# Patient Record
Sex: Male | Born: 1960 | Race: Black or African American | Hispanic: No | Marital: Married | Smoking: Never smoker
Health system: Southern US, Community
[De-identification: ages and names within clinical notes are randomized; demographics above are authoritative.]

## PROBLEM LIST (undated history)

## (undated) DIAGNOSIS — I10 Essential (primary) hypertension: Secondary | ICD-10-CM

## (undated) DIAGNOSIS — M1990 Unspecified osteoarthritis, unspecified site: Secondary | ICD-10-CM

## (undated) DIAGNOSIS — R55 Syncope and collapse: Secondary | ICD-10-CM

## (undated) DIAGNOSIS — C801 Malignant (primary) neoplasm, unspecified: Secondary | ICD-10-CM

## (undated) DIAGNOSIS — T4145XA Adverse effect of unspecified anesthetic, initial encounter: Secondary | ICD-10-CM

## (undated) DIAGNOSIS — M109 Gout, unspecified: Secondary | ICD-10-CM

## (undated) DIAGNOSIS — R011 Cardiac murmur, unspecified: Secondary | ICD-10-CM

## (undated) DIAGNOSIS — T8859XA Other complications of anesthesia, initial encounter: Secondary | ICD-10-CM

## (undated) HISTORY — PX: NO PAST SURGERIES: SHX2092

---
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
## 2006-12-02 ENCOUNTER — Emergency Department (HOSPITAL_COMMUNITY): Admission: EM | Admit: 2006-12-02 | Discharge: 2006-12-02 | Payer: Self-pay | Admitting: Emergency Medicine

## 2009-04-06 ENCOUNTER — Emergency Department (HOSPITAL_COMMUNITY): Admission: EM | Admit: 2009-04-06 | Discharge: 2009-04-06 | Payer: Self-pay | Admitting: Emergency Medicine

## 2011-05-17 ENCOUNTER — Observation Stay (HOSPITAL_COMMUNITY)
Admission: EM | Admit: 2011-05-17 | Discharge: 2011-05-18 | Disposition: A | Discharge status: Home / Self Care | Payer: Self-pay | Attending: Cardiology | Admitting: Cardiology

## 2011-05-17 ENCOUNTER — Emergency Department (HOSPITAL_COMMUNITY): Payer: Self-pay

## 2011-05-17 DIAGNOSIS — R55 Syncope and collapse: Secondary | ICD-10-CM

## 2011-05-17 DIAGNOSIS — R061 Stridor: Secondary | ICD-10-CM

## 2011-05-17 DIAGNOSIS — R0602 Shortness of breath: Secondary | ICD-10-CM | POA: Insufficient documentation

## 2011-05-17 DIAGNOSIS — R002 Palpitations: Secondary | ICD-10-CM | POA: Insufficient documentation

## 2011-05-17 LAB — CBC
HCT: 41.8 % (ref 39.0–52.0)
Hemoglobin: 14.2 g/dL (ref 13.0–17.0)
MCHC: 34 g/dL (ref 30.0–36.0)
MCV: 83.8 fL (ref 78.0–100.0)
Platelets: 194 10*3/uL (ref 150–400)
RDW: 13.3 % (ref 11.5–15.5)
WBC: 8.5 10*3/uL (ref 4.0–10.5)

## 2011-05-17 LAB — BASIC METABOLIC PANEL
Chloride: 103 mEq/L (ref 96–112)
Creatinine, Ser: 1.26 mg/dL (ref 0.50–1.35)
Glucose, Bld: 147 mg/dL — ABNORMAL HIGH (ref 70–99)
Potassium: 4 mEq/L (ref 3.5–5.1)
Sodium: 140 mEq/L (ref 135–145)

## 2011-05-17 LAB — DIFFERENTIAL
Eosinophils Absolute: 0.2 10*3/uL (ref 0.0–0.7)
Lymphs Abs: 2.6 10*3/uL (ref 0.7–4.0)
Monocytes Absolute: 0.7 10*3/uL (ref 0.1–1.0)
Monocytes Relative: 8 % (ref 3–12)

## 2011-05-18 LAB — CARDIAC PANEL(CRET KIN+CKTOT+MB+TROPI)
Total CK: 171 U/L (ref 7–232)
Troponin I: <0.3 ng/mL (ref <0.30)

## 2011-05-18 LAB — TSH: TSH: 0.775 u[IU]/mL (ref 0.350–4.500)

## 2011-05-19 ENCOUNTER — Encounter: Payer: Self-pay | Admitting: Cardiovascular Disease

## 2011-05-26 NOTE — Discharge Summary (Signed)
  NAMESHAMARION, [name]              ACCOUNT NO.:  [id number]  MEDICAL RECORD NO.:  [id number]  LOCATION:  3734                         FACILITY:  [facility]  PHYSICIAN:  Herbert Pick. Eden Emms, MD, FACCDATE OF BIRTH:  [birth year]  DATE OF ADMISSION:  05/17/2011 DATE OF DISCHARGE:  05/18/2011                              DISCHARGE SUMMARY   PROCEDURES PERFORMED DURING HOSPITALIZATION:  None.  PRIMARY CARDIOLOGIST:  Herbert Pick. Eden Emms, MD, [facility]  PRIMARY CARE PHYSICIAN:  The patient does not have one.  FINAL DISCHARGE DIAGNOSES: 1. Syncopal episode. 2. Palpitations.  HOSPITAL COURSE:  This is [name] who presented to the emergency room after sustained tachy palpitations for 15 seconds while walking outside.  The symptoms resolved spontaneously and he was feeling better.  The patient had no prior medical history and no prior cardiac history prior to admission.  The patient was admitted under observation on telemetry and was to be evaluated for cardiac ischemia.  Labs were completed with two negative cardiac enzymes.  The patient was seen by Dr. Eden Hernandez on day of discharge and had no more near syncope or tachy palpitations.  His blood pressure remained stable and he was felt to be ready for discharge.  He will follow up with Dr. Eden Hernandez as an outpatient.  As an outpatient the patient will have echocardiogram completed to evaluate for structural heart disease and for LV function.  The patient was not prescribed any medications prior to discharge.  DISCHARGE LABORATORIES:  Hemoglobin 14.2, hematocrit 41.8, white blood cells 8.5, platelets 194.  Sodium 140, potassium 4.0, chloride 103, CO2 26, BUN 17, creatinine 1.26, glucose 147, magnesium 2.1, TSH 0.775. Troponins were negative x2.  RADIOLOGY:  Chest x-ray borderline heart size and pulmonary vascularity. No acute abnormality.  VITAL SIGNS ON DISCHARGE:  Blood pressure 167/81, pulse 59, respirations 18, temperature  98.3, O2 sat 100% on room air, the patient weighed 105.3 kg.  DISCHARGE MEDICATIONS:  The patient was not given any discharge medications on discharge.  ALLERGIES:  No known drug allergies.  FOLLOWUP PLANS AND APPOINTMENT: 1. The patient will follow up with Dr. Charlton Haws in our office at     which time an echocardiogram will have been planned for him to     review and discuss with the patient.  Our office will call to make     this appointment as this is a weekend. 2. The patient has been advised if he has recurrent symptoms he is to     report to the ER or call [id number]. 3. The patient is advised to find a primary care physician.  1. Time spent with the patient to include physician time 30 minutes.     Bettey Mare. Lyman Bishop, NP   ______________________________ Herbert Pick Eden Emms, MD, [facility]    KML/MEDQ  D:  05/18/2011  T:  05/18/2011  Job:  [id number]  Electronically Signed by Joni Reining NP on 05/22/2011 04:28:52 PM Electronically Signed by Charlton Haws MD [facility] on 05/26/2011 10:15:56 PM

## 2011-05-27 ENCOUNTER — Encounter: Payer: Self-pay | Admitting: Cardiovascular Disease

## 2011-05-27 ENCOUNTER — Other Ambulatory Visit (HOSPITAL_COMMUNITY): Payer: Self-pay | Admitting: Radiology

## 2011-05-27 DIAGNOSIS — R002 Palpitations: Secondary | ICD-10-CM

## 2011-05-28 ENCOUNTER — Ambulatory Visit (INDEPENDENT_AMBULATORY_CARE_PROVIDER_SITE_OTHER): Payer: Self-pay | Admitting: Cardiovascular Disease

## 2011-05-28 ENCOUNTER — Ambulatory Visit (HOSPITAL_COMMUNITY): Payer: Self-pay | Attending: Cardiovascular Disease | Admitting: Radiology

## 2011-05-28 ENCOUNTER — Encounter: Payer: Self-pay | Admitting: Cardiovascular Disease

## 2011-05-28 DIAGNOSIS — R002 Palpitations: Secondary | ICD-10-CM

## 2011-05-28 DIAGNOSIS — I359 Nonrheumatic aortic valve disorder, unspecified: Secondary | ICD-10-CM | POA: Insufficient documentation

## 2011-05-28 DIAGNOSIS — R011 Cardiac murmur, unspecified: Secondary | ICD-10-CM | POA: Insufficient documentation

## 2011-05-28 DIAGNOSIS — R55 Syncope and collapse: Secondary | ICD-10-CM

## 2011-05-28 DIAGNOSIS — E669 Obesity, unspecified: Secondary | ICD-10-CM | POA: Insufficient documentation

## 2011-05-28 NOTE — Progress Notes (Signed)
50 yo seen in hospital 2 weeks ago for palpitations and "syncope" R/O normal Tele.  Since D/C feels well with no recurrence.  Reviewed echo from today.  Has long standing murmur.  Mild AV thickening with trivial AR.  No need for SBE.  Active with no recurrent lightheadedness.  In hospital ECG ok with normal QT.  Discussed minimizing caffeine and stimulants.  Lab work ok in hospital.  Will need yearly F/U and echo in 2-3 years unless symptoms or murmur change.    ROS: Denies fever, malais, weight loss, blurry vision, decreased visual acuity, cough, sputum, SOB, hemoptysis, pleuritic pain, palpitaitons, heartburn, abdominal pain, melena, lower extremity edema, claudication, or rash.  All other systems reviewed and negative  General: Affect appropriate Healthy:  appears stated age HEENT: normal Neck supple with no adenopathy JVP normal no bruits no thyromegaly Lungs clear with no wheezing and good diaphragmatic motion Heart:  S1/S2 no murmur,rub, gallop or click PMI normal Abdomen: benighn, BS positve, no tenderness, no AAA no bruit.  No HSM or HJR Distal pulses intact with no bruits No edema Neuro non-focal Skin warm and dry No muscular weakness   Current Outpatient Prescriptions  Medication Sig Dispense Refill  . Multiple Vitamin (MULTIVITAMIN) capsule Take 1 capsule by mouth daily.          Allergies  Review of patient's allergies indicates no known allergies.  Electrocardiogram:  Assessment and Plan

## 2011-05-28 NOTE — Assessment & Plan Note (Signed)
Benign.  Echo with AV thickening and trivial AR  No SBE needed

## 2011-05-28 NOTE — Patient Instructions (Signed)
Your physician recommends that you schedule a follow-up appointment in: YEAR WITH DR NISHAN  Your physician recommends that you continue on your current medications as directed. Please refer to the Current Medication list given to you today. 

## 2011-05-28 NOTE — H&P (Signed)
[name], [name] NO.:  [id number]  MEDICAL RECORD NO.:  [id number]  LOCATION:  3734                         FACILITY:  [facility]  PHYSICIAN:  Harlon Flor, MD   DATE OF BIRTH:  1961-04-14  DATE OF ADMISSION:  05/18/2011 DATE OF DISCHARGE:                             HISTORY & PHYSICAL   ADMISSION DIAGNOSES: 1. Near syncope. 2. Palpitations.  PRIMARY CARE PHYSICIAN:  The patient does not have a primary care provider.  CHIEF COMPLAINT:  Palpitations and near syncope.  HISTORY OF PRESENT ILLNESS:  [name] is [name] with no prior medical history who had episodes of sustained tachy palpitations for 15 seconds while walking in from outside today.  He felt short of breath and almost passed out.  These symptoms resolved spontaneously and he is now feeling better.  He has never had symptoms like this before.  The emergency room was initially concerned about significant aortic murmur.  They have spoked to hospitalist about admission for observation, but eventually decided to discuss with Cardiology for admission.  The patient has not had any chest pain.  He did have shortness of breath with this episode, but no other symptoms.  He does not have exertional chest pain that he is aware of.  He has had no history of coronary artery disease.  PAST MEDICAL HISTORY:  None.  SOCIAL HISTORY:  The patient lives at home with his mother and is currently unemployed.  He occasionally drink alcohol, but does not smoke tobacco or use drugs.  FAMILY HISTORY:  There is no history of sudden cardiac death.  No history of early coronary artery disease.  REVIEW OF SYSTEMS:  A 14-organ systems is obtained and is negative except as per HPI.  HOME MEDICATIONS:  None.  ALLERGIES:  No known drug allergies.  PHYSICAL EXAMINATION:  VITAL SIGNS:  Blood pressure 135/60, pulse 72, respirations 16, and temperature 98.9. GENERAL:  No acute  stress. HEENT:  Extraocular movements are intact.  Oropharynx is benign. Nonicteric sclerae. NECK:  Supple. CARDIOVASCULAR:  Regular rate and rhythm with a soft early systolic ejection murmur and a preserved second heart sound. LUNGS:  Clear to auscultation bilaterally. ABDOMEN:  Soft, nontender, and nondistended. EXTREMITIES:  No clubbing, cyanosis, or edema.  Pulses are intact throughout. NEURO:  Afocal.  Moves all extremities well. LYMPH:  No lymphadenopathy. SKIN:  No rashes.  EKG shows normal sinus rhythm without ST-T changes.  Labs are reviewed and are unremarkable.  Chest x-ray is clear with borderline heart size.  ASSESSMENT/PLAN:  [name] is [name] with no prior medical history here with an episode of tachy palpitations and near syncope.  We will bring him in for observation on telemetry per the emergency room's request.  His murmur sounds benign and does not represent significant aortic stenosis.  If he has no evidence on telemetry overnight, we will plan on discharging him home tomorrow and set him up for a 2-D echocardiogram and potentially an event monitor.     Harlon Flor, MD     MMB/MEDQ  D:  05/18/2011  T:  05/18/2011  Job:  [id number]  Electronically  Signed by Meridee Score MD on 05/28/2011 08:15:54 PM

## 2011-05-28 NOTE — Assessment & Plan Note (Signed)
Nonrevurrent  Observe Avoid stimulants caffeine and excessive heat and dehydration

## 2011-05-28 NOTE — Assessment & Plan Note (Signed)
Non recurrent no evidence of significant structural heart disease.  Observe

## 2011-07-10 ENCOUNTER — Emergency Department (HOSPITAL_COMMUNITY): Payer: Self-pay

## 2011-07-10 ENCOUNTER — Emergency Department (HOSPITAL_COMMUNITY)
Admission: EM | Admit: 2011-07-10 | Discharge: 2011-07-10 | Disposition: A | Discharge status: Home / Self Care | Payer: Self-pay | Attending: Emergency Medicine | Admitting: Emergency Medicine

## 2011-07-10 DIAGNOSIS — N201 Calculus of ureter: Secondary | ICD-10-CM | POA: Insufficient documentation

## 2011-07-10 DIAGNOSIS — I10 Essential (primary) hypertension: Secondary | ICD-10-CM | POA: Insufficient documentation

## 2011-07-10 DIAGNOSIS — R109 Unspecified abdominal pain: Secondary | ICD-10-CM | POA: Insufficient documentation

## 2011-07-10 DIAGNOSIS — N289 Disorder of kidney and ureter, unspecified: Secondary | ICD-10-CM | POA: Insufficient documentation

## 2011-07-10 LAB — URINE MICROSCOPIC-ADD ON

## 2011-07-10 LAB — POCT I-STAT, CHEM 8
BUN: 16 mg/dL (ref 6–23)
Calcium, Ion: 1.15 mmol/L (ref 1.12–1.32)
Chloride: 103 mEq/L (ref 96–112)
Glucose, Bld: 163 mg/dL — ABNORMAL HIGH (ref 70–99)
Sodium: 138 mEq/L (ref 135–145)
TCO2: 23 mmol/L (ref 0–100)

## 2011-07-10 LAB — URINALYSIS, ROUTINE W REFLEX MICROSCOPIC
Bilirubin Urine: NEGATIVE
Leukocytes, UA: NEGATIVE
Nitrite: NEGATIVE
Protein, ur: NEGATIVE mg/dL
Specific Gravity, Urine: 1.025 (ref 1.005–1.030)
Urobilinogen, UA: 0.2 mg/dL (ref 0.0–1.0)

## 2011-11-02 ENCOUNTER — Encounter (HOSPITAL_COMMUNITY): Payer: Self-pay | Admitting: Emergency Medicine

## 2011-11-02 ENCOUNTER — Other Ambulatory Visit: Payer: Self-pay

## 2011-11-02 ENCOUNTER — Emergency Department (HOSPITAL_COMMUNITY)
Admission: EM | Admit: 2011-11-02 | Discharge: 2011-11-03 | Disposition: A | Discharge status: Home / Self Care | Payer: Self-pay | Attending: Emergency Medicine | Admitting: Emergency Medicine

## 2011-11-02 DIAGNOSIS — R0602 Shortness of breath: Secondary | ICD-10-CM | POA: Insufficient documentation

## 2011-11-02 DIAGNOSIS — R42 Dizziness and giddiness: Secondary | ICD-10-CM | POA: Insufficient documentation

## 2011-11-02 DIAGNOSIS — R002 Palpitations: Secondary | ICD-10-CM | POA: Insufficient documentation

## 2011-11-02 DIAGNOSIS — I10 Essential (primary) hypertension: Secondary | ICD-10-CM | POA: Insufficient documentation

## 2011-11-02 DIAGNOSIS — R51 Headache: Secondary | ICD-10-CM | POA: Insufficient documentation

## 2011-11-02 NOTE — ED Notes (Signed)
C/o feeling lightheaded and heart racing x 20 minutes.

## 2011-11-03 MED ORDER — ACETAMINOPHEN 325 MG PO TABS
650.0000 mg | ORAL_TABLET | Freq: Once | ORAL | Status: AC
  Administered 2011-11-03: 650 mg via ORAL
  Filled 2011-11-03: qty 2

## 2011-11-03 NOTE — ED Provider Notes (Signed)
History     CSN: [id number]  Arrival date & time 11/02/11  2229   First MD Initiated Contact with Patient 11/03/11 0025      Chief Complaint  Patient presents with  . Dizziness     Patient is a 50 y.o. male presenting with palpitations. The history is provided by the patient.  Palpitations  This is a recurrent problem. The current episode started 1 to 2 hours ago. The problem occurs constantly. The problem has been rapidly improving. Associated with: nothing. On average, each episode lasts 20 minutes. Associated symptoms include headaches, dizziness and shortness of breath. Pertinent negatives include no diaphoresis, no fever, no malaise/fatigue, no numbness, no chest pain, no chest pressure, no exertional chest pressure, no irregular heartbeat, no near-syncope, no syncope, no abdominal pain, no nausea, no vomiting, no back pain and no cough. He has tried nothing for the symptoms.  pt had these symptoms several months ago, admitted/evaluated and no symptoms since No syncope No fam h/o sudden death He is otherwise healthy Denies drugs or excessive caffeine  Past Medical History  Diagnosis Date  . Syncope     near  . Palpitations     History reviewed. No pertinent past surgical history.  Fam hx - negative for sudden death  History  Substance Use Topics  . Smoking status: Never Smoker   . Smokeless tobacco: Not on file  . Alcohol Use: Yes     occas      Review of Systems  Constitutional: Negative for fever, malaise/fatigue and diaphoresis.  Respiratory: Positive for shortness of breath. Negative for cough.   Cardiovascular: Positive for palpitations. Negative for chest pain, syncope and near-syncope.  Gastrointestinal: Negative for nausea, vomiting and abdominal pain.  Musculoskeletal: Negative for back pain.  Neurological: Positive for dizziness and headaches. Negative for numbness.  All other systems reviewed and are negative.    Allergies  Review of patient's  allergies indicates no known allergies.  Home Medications  No current outpatient prescriptions on file.  BP 180/70  Pulse 62  Temp(Src) 97.7 F (36.5 C) (Oral)  Resp 20  SpO2 99%  Physical Exam CONSTITUTIONAL: Well developed/well nourished HEAD AND FACE: Normocephalic/atraumatic EYES: EOMI/PERRL ENMT: Mucous membranes moist NECK: supple no meningeal signs CV: S1/S2 noted,murmur noted mostly in RUSB LUNGS: Lungs are clear to auscultation bilaterally, no apparent distress ABDOMEN: soft, nontender, no rebound or guarding GU:no cva tenderness NEURO: Pt is awake/alert, moves all extremitiesx4 EXTREMITIES: pulses normal, full ROM SKIN: warm, color normal PSYCH: no abnormalities of mood noted  ED Course  Procedures   Pt well appearing Reviewed chart, had essentially normal echo earlier this yr, seen by cardiology only to f/u every 2 yrs.  Noted to have murmur on previous exam Doubt ACS/PE at this time No recent travel, no significant PE risk factors Will need f/u for HTN and I told him this  1. Palpitations   2. Hypertension       MDM  Nursing notes reviewed and considered in documentation Previous records reviewed and considered    Date: 11/03/2011  Rate: 65  Rhythm: normal sinus rhythm  QRS Axis: normal  Intervals: normal  ST/T Wave abnormalities: nonspecific ST changes  Conduction Disutrbances:none  Narrative Interpretation:   Old EKG Reviewed: unchanged          Joya Gaskins, MD 11/03/11 [phone]

## 2011-11-03 NOTE — ED Notes (Signed)
Patient reports a 20 minute episode of feeling heart beat fast and lightheaded. Denies pain and is feeling back to normal at this time

## 2011-12-27 ENCOUNTER — Emergency Department (HOSPITAL_COMMUNITY): Payer: Self-pay

## 2011-12-27 ENCOUNTER — Emergency Department (HOSPITAL_COMMUNITY)
Admission: EM | Admit: 2011-12-27 | Discharge: 2011-12-27 | Disposition: A | Discharge status: Home / Self Care | Payer: Self-pay | Attending: Emergency Medicine | Admitting: Emergency Medicine

## 2011-12-27 ENCOUNTER — Encounter (HOSPITAL_COMMUNITY): Payer: Self-pay | Admitting: Emergency Medicine

## 2011-12-27 DIAGNOSIS — M7022 Olecranon bursitis, left elbow: Secondary | ICD-10-CM

## 2011-12-27 DIAGNOSIS — M7989 Other specified soft tissue disorders: Secondary | ICD-10-CM | POA: Insufficient documentation

## 2011-12-27 DIAGNOSIS — I10 Essential (primary) hypertension: Secondary | ICD-10-CM | POA: Insufficient documentation

## 2011-12-27 DIAGNOSIS — M7020 Olecranon bursitis, unspecified elbow: Secondary | ICD-10-CM | POA: Insufficient documentation

## 2011-12-27 HISTORY — DX: Essential (primary) hypertension: I10

## 2011-12-27 MED ORDER — OXYCODONE-ACETAMINOPHEN 5-325 MG PO TABS
2.0000 | ORAL_TABLET | Freq: Once | ORAL | Status: AC
  Administered 2011-12-27: 2 via ORAL
  Filled 2011-12-27 (×2): qty 1

## 2011-12-27 MED ORDER — NAPROXEN 500 MG PO TABS: 500.0000 mg | ORAL_TABLET | Freq: Two times a day (BID) | ORAL | Status: DC

## 2011-12-27 MED ORDER — IBUPROFEN 800 MG PO TABS
800.0000 mg | ORAL_TABLET | Freq: Once | ORAL | Status: AC
  Administered 2011-12-27: 800 mg via ORAL
  Filled 2011-12-27: qty 1

## 2011-12-27 MED ORDER — OXYCODONE-ACETAMINOPHEN 5-325 MG PO TABS: 1.0000 | ORAL_TABLET | Freq: Four times a day (QID) | ORAL | Status: AC | PRN

## 2011-12-27 NOTE — ED Provider Notes (Signed)
History     CSN: [id number]  Arrival date & time 12/27/11  1023   First MD Initiated Contact with Patient 12/27/11 1059      Chief Complaint  Patient presents with  . Arm Swelling    Left elbow    (Consider location/radiation/quality/duration/timing/severity/associated sxs/prior treatment) The history is provided by the patient. No language interpreter was used.  Swelling and pain on palpation and movement of the left elbow for the past 2 days. He denies recent injury. CT is been doing various activities around the house which are not extremely strenuous. He has no numbness or tingling. States has greatly been worse over the past 24 hours. No prior injury to this area. Pain is localized over the olecranon process. There is no redness or warmth. He denies fever. No chest pain, shortness of breath, abdominal pain, nausea, vomiting.  Past Medical History  Diagnosis Date  . Syncope     near  . Palpitations   . Hypertension     History reviewed. No pertinent past surgical history.  History reviewed. No pertinent family history.  History  Substance Use Topics  . Smoking status: Never Smoker   . Smokeless tobacco: Not on file  . Alcohol Use: Yes     occas      Review of Systems  Constitutional: Negative for fever, activity change, appetite change and fatigue.  HENT: Negative for congestion, sore throat, rhinorrhea, neck pain and neck stiffness.   Respiratory: Negative for cough and shortness of breath.   Cardiovascular: Negative for chest pain and palpitations.  Gastrointestinal: Negative for nausea, vomiting and abdominal pain.  Genitourinary: Negative for dysuria, urgency, frequency and flank pain.  Musculoskeletal: Positive for myalgias, joint swelling and arthralgias. Negative for back pain.  Neurological: Negative for dizziness, weakness, light-headedness, numbness and headaches.  All other systems reviewed and are negative.    Allergies  Review of patient's  allergies indicates no known allergies.  Home Medications   Current Outpatient Rx  Name Route Sig Dispense Refill  . ACETAMINOPHEN 500 MG PO TABS Oral Take 1,000 mg by mouth every 6 (six) hours as needed. For pain    . FISH OIL 1200 MG PO CAPS Oral Take 1,200 mg by mouth daily.    Marland Kitchen NAPROXEN 500 MG PO TABS Oral Take 1 tablet (500 mg total) by mouth 2 (two) times daily. 30 tablet 0  . OXYCODONE-ACETAMINOPHEN 5-325 MG PO TABS Oral Take 1-2 tablets by mouth every 6 (six) hours as needed for pain. 15 tablet 0    BP 165/69  Pulse 81  Temp 98.3 F (36.8 C)  Resp 20  SpO2 99%  Physical Exam  Nursing note and vitals reviewed. Constitutional: He is oriented to person, place, and time. He appears well-developed and well-nourished. No distress.  HENT:  Head: Normocephalic and atraumatic.  Mouth/Throat: Oropharynx is clear and moist.  Eyes: Conjunctivae and EOM are normal. Pupils are equal, round, and reactive to light.  Neck: Normal range of motion. Neck supple.  Cardiovascular: Normal rate, regular rhythm, normal heart sounds and intact distal pulses.  Exam reveals no gallop and no friction rub.   No murmur heard. Pulmonary/Chest: Effort normal and breath sounds normal. No respiratory distress.  Abdominal: Soft. Bowel sounds are normal. There is no tenderness.  Musculoskeletal: Normal range of motion. He exhibits tenderness.       Left elbow: He exhibits swelling. He exhibits normal range of motion, no effusion, no deformity and no laceration. tenderness found. Olecranon process  tenderness noted. No radial head, no medial epicondyle and no lateral epicondyle tenderness noted.  Lymphadenopathy:    He has no cervical adenopathy.  Neurological: He is alert and oriented to person, place, and time. No cranial nerve deficit.  Skin: Skin is warm and dry. No rash noted.    ED Course  Procedures (including critical care time)  Labs Reviewed - No data to display Dg Elbow Complete  Left  12/27/2011  *RADIOLOGY REPORT*  Clinical Data: Elbow swelling and pain.No known injury.  Decreased range of motion.  LEFT ELBOW - COMPLETE 3+ VIEW  Comparison: None.  Findings: No evidence of fracture or dislocation.  No evidence of elbow joint effusion. Small osteophytes are seen at the lateral epicondyle and olecranon process.  The  IMPRESSION: No acute findings.  Original Report Authenticated By: Danae Orleans, M.D.     1. Olecranon bursitis of left elbow       MDM  Imaging with some mild osteophyte formation consistent with early arthritis however feel the majority of symptoms are secondary to olecranon bursitis. He is provided and anti-inflammatory medication as well as pain medication. You should followup with orthopedics as needed. I also instructed to put ice for the first 2 days and heat thereafter. There is no signs of infected bursa.        Dayton Bailiff, MD 12/27/11 1140

## 2011-12-27 NOTE — ED Notes (Signed)
Pt reports swelling to left elbow x 2 days. Pt denies recent injury.

## 2011-12-27 NOTE — Discharge Instructions (Signed)
Bursitis, Olecranon Bursitis is a swelling and soreness (inflammation) of a fluid-filled sac (bursa) that overlies and protects a joint. Yours is over the elbow and the name for this is Olecranon bursitis. Other common places to get this are the shoulders, hips and knees. CAUSES Bursitis can be caused by injury, overuse of the joint, arthritis, or infection.  SYMPTOMS   Tenderness, swelling, warmth, or redness over the elbow.   Pain with moving the elbow. This is greater with bending the elbow.   There may be a squeaking when the bursa is rubbed or moved.   The bursa may increase in size without pain or discomfort.   You may develop a fever with increasing pain and swelling if the bursa becomes infected.  HOME CARE INSTRUCTIONS   Apply ice to the affected area for 15 to 20 minutes each hour while awake for 2 days. Put the ice in a plastic bag and place a towel between the bag of ice and your skin.   When resting, elevate your elbow above the level of your heart. This helps reduce swelling.   Rest the injured joint as much as possible. Continue to put the joint through a full range of motion 4 times per day. When the pain lessens, begin normal slow movements and usual activities.   Only take over-the-counter or prescription medicines for pain, discomfort, or fever as directed by your caregiver.   If conservative treatment does not work, your caregiver may decide to drain the bursa and inject cortisone. This speeds up the healing process.   Reduce your intake of milk and related dairy products. They may make your condition worse.  SEEK IMMEDIATE MEDICAL CARE IF:   Your pain increases even during treatment.   You develop an oral temperature above 102 F (38.9 C).   You have heat and inflammation over the involved bursa and elbow.   There is a red line that goes up your arm.   You develop pain with movement of your elbow.  MAKE SURE YOU:   Understand these instructions.   Will  watch your condition.   Will get help right away if you are not doing well or get worse.  Document Released: 11/19/2006 Document Revised: 07/02/2011 Document Reviewed: 10/05/2007 [employer] Patient Information 2012 [employer]. 

## 2012-02-17 ENCOUNTER — Encounter (HOSPITAL_COMMUNITY): Payer: Self-pay | Admitting: Emergency Medicine

## 2012-02-17 ENCOUNTER — Inpatient Hospital Stay (HOSPITAL_COMMUNITY)
Admission: EM | Admit: 2012-02-17 | Discharge: 2012-02-18 | DRG: 313 | Disposition: A | Discharge status: Home / Self Care | Payer: Self-pay | Attending: Family Medicine | Admitting: Family Medicine

## 2012-02-17 ENCOUNTER — Emergency Department (HOSPITAL_COMMUNITY): Payer: Self-pay

## 2012-02-17 DIAGNOSIS — R002 Palpitations: Secondary | ICD-10-CM | POA: Diagnosis present

## 2012-02-17 DIAGNOSIS — F1010 Alcohol abuse, uncomplicated: Secondary | ICD-10-CM | POA: Diagnosis present

## 2012-02-17 DIAGNOSIS — R079 Chest pain, unspecified: Secondary | ICD-10-CM | POA: Diagnosis present

## 2012-02-17 DIAGNOSIS — R55 Syncope and collapse: Secondary | ICD-10-CM | POA: Diagnosis present

## 2012-02-17 DIAGNOSIS — R0789 Other chest pain: Principal | ICD-10-CM | POA: Diagnosis present

## 2012-02-17 DIAGNOSIS — I10 Essential (primary) hypertension: Secondary | ICD-10-CM | POA: Diagnosis present

## 2012-02-17 HISTORY — DX: Syncope and collapse: R55

## 2012-02-17 HISTORY — DX: Cardiac murmur, unspecified: R01.1

## 2012-02-17 HISTORY — DX: Unspecified osteoarthritis, unspecified site: M19.90

## 2012-02-17 LAB — CBC
MCHC: 32.8 g/dL (ref 30.0–36.0)
Platelets: 201 10*3/uL (ref 150–400)
RDW: 13.2 % (ref 11.5–15.5)
WBC: 7.7 10*3/uL (ref 4.0–10.5)

## 2012-02-17 LAB — APTT: aPTT: 46 seconds — ABNORMAL HIGH (ref 24–37)

## 2012-02-17 LAB — POCT I-STAT, CHEM 8
BUN: 21 mg/dL (ref 6–23)
Creatinine, Ser: 1.2 mg/dL (ref 0.50–1.35)
Glucose, Bld: 147 mg/dL — ABNORMAL HIGH (ref 70–99)
Hemoglobin: 16.3 g/dL (ref 13.0–17.0)
Potassium: 3.8 mEq/L (ref 3.5–5.1)
Sodium: 140 mEq/L (ref 135–145)

## 2012-02-17 LAB — DIFFERENTIAL
Basophils Absolute: 0 10*3/uL (ref 0.0–0.1)
Basophils Relative: 0 % (ref 0–1)
Eosinophils Relative: 3 % (ref 0–5)
Lymphocytes Relative: 33 % (ref 12–46)
Neutro Abs: 4.3 10*3/uL (ref 1.7–7.7)

## 2012-02-17 LAB — POCT I-STAT TROPONIN I: Troponin i, poc: 0 ng/mL (ref 0.00–0.08)

## 2012-02-17 LAB — COMPREHENSIVE METABOLIC PANEL
AST: 15 U/L (ref 0–37)
Albumin: 3.8 g/dL (ref 3.5–5.2)
Chloride: 103 mEq/L (ref 96–112)
Creatinine, Ser: 1.02 mg/dL (ref 0.50–1.35)
Total Bilirubin: 0.2 mg/dL — ABNORMAL LOW (ref 0.3–1.2)
Total Protein: 7.3 g/dL (ref 6.0–8.3)

## 2012-02-17 LAB — PROTIME-INR
INR: 1.03 (ref 0.00–1.49)
Prothrombin Time: 13.7 seconds (ref 11.6–15.2)

## 2012-02-17 MED ORDER — ASPIRIN 81 MG PO CHEW
324.0000 mg | CHEWABLE_TABLET | Freq: Once | ORAL | Status: AC
  Administered 2012-02-17: 324 mg via ORAL
  Filled 2012-02-17: qty 4

## 2012-02-17 MED ORDER — HEPARIN SODIUM (PORCINE) 5000 UNIT/ML IJ SOLN
INTRAMUSCULAR | Status: AC
  Administered 2012-02-17: 4000 [IU]
  Filled 2012-02-17: qty 1

## 2012-02-17 MED ORDER — HEPARIN BOLUS VIA INFUSION: 4000.0000 [IU] | Freq: Once | INTRAVENOUS | Status: DC

## 2012-02-17 MED ORDER — HEPARIN SODIUM (PORCINE) 5000 UNIT/ML IJ SOLN: 60.0000 [IU]/kg | INTRAMUSCULAR | Status: DC

## 2012-02-17 MED ORDER — MORPHINE SULFATE 2 MG/ML IJ SOLN
INTRAMUSCULAR | Status: AC
  Filled 2012-02-17: qty 1

## 2012-02-17 MED ORDER — HEPARIN (PORCINE) IN NACL 100-0.45 UNIT/ML-% IJ SOLN
1200.0000 [IU]/h | INTRAMUSCULAR | Status: DC
  Administered 2012-02-17: 1200 [IU]/h via INTRAVENOUS
  Filled 2012-02-17 (×3): qty 250

## 2012-02-17 MED ORDER — SODIUM CHLORIDE 0.9 % IV SOLN
INTRAVENOUS | Status: DC
  Administered 2012-02-17: 22:00:00 via INTRAVENOUS

## 2012-02-17 NOTE — Progress Notes (Signed)
ANTICOAGULATION CONSULT NOTE - Initial Consult  Pharmacy Consult for Heparin Indication: STEMI  No Known Allergies  Patient Measurements:   Patient state wt=220 lbs, ht=5'7" Heparin Dosing Weight: 88  Vital Signs: Temp: 98.3 F (36.8 C) (04/16 2153) Temp src: Oral (04/16 2153) BP: 159/75 mmHg (04/16 2153) Pulse Rate: 65  (04/16 2153)  Labs: No results found for this basename: HGB:2,HCT:3,PLT:3,APTT:3,LABPROT:3,INR:3,HEPARINUNFRC:3,CREATININE:3,CKTOTAL:3,CKMB:3,TROPONINI:3 in the last 72 hours The CrCl is unknown because both a height and weight (above a minimum accepted value) are required for this calculation.  Medical History: Past Medical History  Diagnosis Date  . Syncope     near  . Palpitations   . Hypertension    Assessment: 51 yo M admitted as code STEMI, to begin heparin infusion.  Goal of Therapy:  Heparin level 0.3-0.7 units/ml   Plan:  1.  Heparin 4000 unit IV bolus x 1 2.  Begin heparin infusion at 1200 units/hr 3.  F/up after cath, or will need to order heparin level  Rolland Porter, Pharm.D., BCPS Clinical Pharmacist Pager: [phone]

## 2012-02-17 NOTE — ED Notes (Signed)
Pt moved to room blue 3, Family x2 at [facility], Dr. Sharyn Lull into room with card fellow, no changes with pt, pt calm, alert, NAD, denies pain.

## 2012-02-17 NOTE — ED Provider Notes (Signed)
History     CSN: [id number]  Arrival date & time 02/17/12  2132   First MD Initiated Contact with Patient 02/17/12 2150      Chief Complaint  Patient presents with  . Palpitations    (Consider location/radiation/quality/duration/timing/severity/associated sxs/prior treatment) HPI A LEVEL 5 CAVEAT PERTAINS DUE TO URGENT NEED FOR INTERVENTION Pt presents with c/o chest tightness and palpitations.  Pt states symptoms lasted approx 5 minutes and resolved prior to ED arrival.  He also reports sob, nausea with the chest discomfort.  No radiation, no diaphoresis.  He denies recent illness.  He states he had just gone out to walk outside when symptoms started.  He also states he had similar chest pain approx 3 months ago.    Past Medical History  Diagnosis Date  . Syncope     near  . Palpitations   . Hypertension     Past Surgical History  Procedure Date  . No past surgeries     History reviewed. No pertinent family history.  History  Substance Use Topics  . Smoking status: Never Smoker   . Smokeless tobacco: Not on file  . Alcohol Use: Yes     occas      Review of Systems UNABLE TO OBTAIN ROS DUE TO LEVEL 5 CAVEAT  Allergies  Review of patient's allergies indicates no known allergies.  Home Medications   Current Outpatient Rx  Name Route Sig Dispense Refill  . ACETAMINOPHEN 500 MG PO TABS Oral Take 1,000 mg by mouth every 6 (six) hours as needed. For pain    . NAPROXEN 500 MG PO TABS Oral Take 500 mg by mouth 2 (two) times daily as needed. For pain    . FISH OIL 1200 MG PO CAPS Oral Take 1,200 mg by mouth daily.      BP 142/54  Pulse 63  Temp(Src) 98 F (36.7 C) (Oral)  Resp 18  SpO2 100% Vitals reviewed Physical Exam Physical Examination: General appearance - alert, well appearing, and in no distress Mental status - alert, oriented to person, place, and time Mouth - mucous membranes moist, pharynx normal without lesions Chest - clear to auscultation, no  wheezes, rales or rhonchi, symmetric air entry Heart - normal rate, regular rhythm, normal S1, S2, no murmurs, rubs, clicks or gallops Abdomen - soft, nontender, nondistended, no masses or organomegaly, nabs Musculoskeletal - no joint tenderness, deformity or swelling Extremities - peripheral pulses normal, no pedal edema, no clubbing or cyanosis Skin - normal coloration and turgor, no rashes  ED Course  Procedures (including critical care time)   Date: 02/17/2012, 22:03  Rate: 66  Rhythm: normal sinus rhythm  QRS Axis: normal  Intervals: normal  ST/T Wave abnormalities: normal  Conduction Disutrbances:none  Narrative Interpretation:   Old EKG Reviewed: changes noted, ST elevations and twave inversions in II, III normalized since prior ekg tonight   Date: 02/17/2012  Rate: 62  Rhythm: normal sinus rhythm  QRS Axis: normal  Intervals: normal  ST/T Wave abnormalities: ST elevations laterally  Conduction Disutrbances:none  Narrative Interpretation:   Old EKG Reviewed: changes noted, changed compared to prior ekg of 11/02/11      9:52 PM Pt seen in EKG room after EKG given to me, code stemi activated.   10:13 PM Dr. Leeann Must, covering cardiology has seen patient, he remains chest pain free now in ED.  2nd repeat EKG showed continued EKG changes, however 3rd EKG approx 20 minutes after arrival shows resolution of ST  elevations and deep t wave inversions.  Dr. Tresa Endo recommends treating as unstable angina at this time.  He is cancelling code stemi at this time.   11:25 PM Pt remains chest pain free- d/w Leeann Must, MD- pt is stable for admission to hospitalist service at this time.     CRITICAL CARE Performed by: Ethelda Chick   Total critical care time: 45  Critical care time was exclusive of separately billable procedures and treating other patients.  Critical care was necessary to treat or prevent imminent or life-threatening deterioration.  Critical care was time  spent personally by me on the following activities: development of treatment plan with patient and/or surrogate as well as nursing, discussions with consultants, evaluation of patient's response to treatment, examination of patient, obtaining history from patient or surrogate, ordering and performing treatments and interventions, ordering and review of laboratory studies, ordering and review of radiographic studies, pulse oximetry and re-evaluation of patient's condition.  Labs Reviewed  APTT - Abnormal; Notable for the following:    aPTT 46 (*)    All other components within normal limits  COMPREHENSIVE METABOLIC PANEL - Abnormal; Notable for the following:    Glucose, Bld 138 (*)    Total Bilirubin 0.2 (*)    GFR calc non Af Amer 83 (*)    All other components within normal limits  POCT I-STAT, CHEM 8 - Abnormal; Notable for the following:    Glucose, Bld 147 (*)    All other components within normal limits  CBC  DIFFERENTIAL  PROTIME-INR  POCT I-STAT TROPONIN I  URINE RAPID DRUG SCREEN (HOSP PERFORMED)   Dg Chest Port 1 View  02/17/2012  *RADIOLOGY REPORT*  Clinical Data: Codes stem knee.  Lightheaded.  Irregular heart rate.  PORTABLE CHEST - 1 VIEW  Comparison: Seven 14,012  Findings: Stable borderline cardiomegaly.  Normal mediastinal and hilar contours.  Pulmonary vascularity normal.  No evidence of edema, focal airspace disease, or pleural effusion.  Negative for pneumothorax.  No acute bony abnormality.  IMPRESSION: Stable borderline cardiomegaly.  No acute findings identified.  Original Report Authenticated By: Britta Mccreedy, M.D.     1. Chest pain       MDM  Pt presnts with c/o palpitations and chest tightness- episode resolved upon arrival to the ED.  EKG showed ST elevations in lateral, anterior leads as well as t wave inversions in inferior leads.  Code stemi activated.  Pt placed on monitor, IV access obtained.  Cardiology evaluated patient at the bedside- repeat EKG after  approx 20 minutes in ED of no chest pain was normalized.  Heparin drip started.  Pt remained chest pain free, labs reassuring.  Cardiology recommends admission to [facility].          Ethelda Chick, MD 02/18/12 [phone]

## 2012-02-17 NOTE — ED Notes (Signed)
PT. ARRIVED WITH EMS FROM HOME , REPORTS PALPITATIONS THIS EVENING , DENIES CHEST PAIN OR SOB.

## 2012-02-17 NOTE — ED Notes (Signed)
Card MD & EDP at [facility].

## 2012-02-17 NOTE — ED Notes (Signed)
Pt's daughter given apple juice, pretzels and cookies. No other needs voiced at this time.

## 2012-02-17 NOTE — ED Notes (Signed)
Pt's daughter given more apple juice. No other needs voiced at this time.

## 2012-02-17 NOTE — ED Notes (Addendum)
EDP at [facility], updated, no changes, calm, alert, NAD, interactive, (denies: pain of any kind or any location, sob, nausea, dizziness, HA or other sx), family at [facility].

## 2012-02-17 NOTE — ED Notes (Signed)
Dr.Linker to  eval ecg at bedside,

## 2012-02-18 ENCOUNTER — Encounter (HOSPITAL_COMMUNITY): Payer: Self-pay | Admitting: Internal Medicine

## 2012-02-18 ENCOUNTER — Other Ambulatory Visit: Payer: Self-pay

## 2012-02-18 DIAGNOSIS — R079 Chest pain, unspecified: Secondary | ICD-10-CM | POA: Diagnosis present

## 2012-02-18 DIAGNOSIS — R002 Palpitations: Secondary | ICD-10-CM

## 2012-02-18 DIAGNOSIS — F1010 Alcohol abuse, uncomplicated: Secondary | ICD-10-CM | POA: Diagnosis present

## 2012-02-18 LAB — CARDIAC PANEL(CRET KIN+CKTOT+MB+TROPI)
CK, MB: 3.1 ng/mL (ref 0.3–4.0)
Relative Index: 1.8 (ref 0.0–2.5)
Relative Index: 2 (ref 0.0–2.5)
Total CK: 155 U/L (ref 7–232)
Troponin I: <0.3 ng/mL (ref <0.30)
Troponin I: <0.3 ng/mL (ref <0.30)

## 2012-02-18 LAB — CBC
MCH: 27.9 pg (ref 26.0–34.0)
MCV: 83.8 fL (ref 78.0–100.0)
Platelets: 210 10*3/uL (ref 150–400)
RBC: 4.95 MIL/uL (ref 4.22–5.81)
RDW: 13.5 % (ref 11.5–15.5)
WBC: 7.1 10*3/uL (ref 4.0–10.5)

## 2012-02-18 LAB — HEMOGLOBIN A1C: Mean Plasma Glucose: 146 mg/dL — ABNORMAL HIGH (ref <117)

## 2012-02-18 LAB — D-DIMER, QUANTITATIVE: D-Dimer, Quant: <0.22 ug/mL-FEU (ref 0.00–0.48)

## 2012-02-18 LAB — COMPREHENSIVE METABOLIC PANEL
Albumin: 3.5 g/dL (ref 3.5–5.2)
Alkaline Phosphatase: 57 U/L (ref 39–117)
BUN: 16 mg/dL (ref 6–23)
CO2: 23 mEq/L (ref 19–32)
Chloride: 104 mEq/L (ref 96–112)
Creatinine, Ser: 1.05 mg/dL (ref 0.50–1.35)
GFR calc non Af Amer: 80 mL/min — ABNORMAL LOW (ref >90)
Glucose, Bld: 115 mg/dL — ABNORMAL HIGH (ref 70–99)
Potassium: 3.9 mEq/L (ref 3.5–5.1)
Total Bilirubin: 0.3 mg/dL (ref 0.3–1.2)

## 2012-02-18 LAB — APTT: aPTT: 59 seconds — ABNORMAL HIGH (ref 24–37)

## 2012-02-18 LAB — RAPID URINE DRUG SCREEN, HOSP PERFORMED
Amphetamines: NOT DETECTED
Benzodiazepines: NOT DETECTED
Opiates: NOT DETECTED
Tetrahydrocannabinol: NOT DETECTED

## 2012-02-18 LAB — HEPARIN LEVEL (UNFRACTIONATED): Heparin Unfractionated: 0.35 IU/mL (ref 0.30–0.70)

## 2012-02-18 MED ORDER — SODIUM CHLORIDE 0.9 % IV SOLN
INTRAVENOUS | Status: DC
  Administered 2012-02-18 (×2): via INTRAVENOUS

## 2012-02-18 MED ORDER — NITROGLYCERIN 0.4 MG SL SUBL: 0.4000 mg | SUBLINGUAL_TABLET | SUBLINGUAL | Status: DC | PRN

## 2012-02-18 MED ORDER — ASPIRIN EC 81 MG PO TBEC: 81.0000 mg | DELAYED_RELEASE_TABLET | Freq: Every day | ORAL | Status: DC

## 2012-02-18 MED ORDER — ADULT MULTIVITAMIN W/MINERALS CH
1.0000 | ORAL_TABLET | Freq: Every day | ORAL | Status: DC
  Administered 2012-02-18: 1 via ORAL
  Filled 2012-02-18: qty 1

## 2012-02-18 MED ORDER — ACETAMINOPHEN 650 MG RE SUPP: 650.0000 mg | Freq: Four times a day (QID) | RECTAL | Status: DC | PRN

## 2012-02-18 MED ORDER — PNEUMOCOCCAL VAC POLYVALENT 25 MCG/0.5ML IJ INJ
0.5000 mL | INJECTION | Freq: Once | INTRAMUSCULAR | Status: AC
  Administered 2012-02-18: 0.5 mL via INTRAMUSCULAR
  Filled 2012-02-18: qty 0.5

## 2012-02-18 MED ORDER — PANTOPRAZOLE SODIUM 40 MG PO TBEC
40.0000 mg | DELAYED_RELEASE_TABLET | Freq: Every day | ORAL | Status: DC
  Administered 2012-02-18: 40 mg via ORAL
  Filled 2012-02-18: qty 1

## 2012-02-18 MED ORDER — LISINOPRIL 10 MG PO TABS: 10.0000 mg | ORAL_TABLET | Freq: Every day | ORAL | Status: DC

## 2012-02-18 MED ORDER — LORAZEPAM 0.5 MG PO TABS
0.0000 mg | ORAL_TABLET | Freq: Four times a day (QID) | ORAL | Status: DC
  Administered 2012-02-18: 0.5 mg via ORAL
  Filled 2012-02-18: qty 1

## 2012-02-18 MED ORDER — ONDANSETRON HCL 4 MG/2ML IJ SOLN: 4.0000 mg | Freq: Four times a day (QID) | INTRAMUSCULAR | Status: DC | PRN

## 2012-02-18 MED ORDER — LORAZEPAM 2 MG/ML IJ SOLN: 1.0000 mg | Freq: Four times a day (QID) | INTRAMUSCULAR | Status: DC | PRN

## 2012-02-18 MED ORDER — FOLIC ACID 1 MG PO TABS
1.0000 mg | ORAL_TABLET | Freq: Every day | ORAL | Status: DC
  Administered 2012-02-18: 1 mg via ORAL
  Filled 2012-02-18: qty 1

## 2012-02-18 MED ORDER — VITAMIN B-1 100 MG PO TABS
100.0000 mg | ORAL_TABLET | Freq: Every day | ORAL | Status: DC
  Administered 2012-02-18: 100 mg via ORAL
  Filled 2012-02-18: qty 1

## 2012-02-18 MED ORDER — HEPARIN (PORCINE) IN NACL 100-0.45 UNIT/ML-% IJ SOLN
1300.0000 [IU]/h | INTRAMUSCULAR | Status: DC
  Administered 2012-02-18: 1300 [IU]/h via INTRAVENOUS
  Filled 2012-02-18: qty 250

## 2012-02-18 MED ORDER — LORAZEPAM 0.5 MG PO TABS: 0.0000 mg | ORAL_TABLET | Freq: Two times a day (BID) | ORAL | Status: DC

## 2012-02-18 MED ORDER — ASPIRIN EC 325 MG PO TBEC
325.0000 mg | DELAYED_RELEASE_TABLET | Freq: Every day | ORAL | Status: DC
  Administered 2012-02-18: 325 mg via ORAL
  Filled 2012-02-18: qty 1

## 2012-02-18 MED ORDER — AMLODIPINE BESYLATE 5 MG PO TABS
5.0000 mg | ORAL_TABLET | Freq: Every day | ORAL | Status: DC
  Administered 2012-02-18: 5 mg via ORAL
  Filled 2012-02-18: qty 1

## 2012-02-18 MED ORDER — LORAZEPAM 0.5 MG PO TABS: 1.0000 mg | ORAL_TABLET | Freq: Four times a day (QID) | ORAL | Status: DC | PRN

## 2012-02-18 MED ORDER — SODIUM CHLORIDE 0.9 % IJ SOLN
3.0000 mL | Freq: Two times a day (BID) | INTRAMUSCULAR | Status: DC
  Administered 2012-02-18: 3 mL via INTRAVENOUS

## 2012-02-18 MED ORDER — ACETAMINOPHEN 325 MG PO TABS: 650.0000 mg | ORAL_TABLET | Freq: Four times a day (QID) | ORAL | Status: DC | PRN

## 2012-02-18 MED ORDER — THIAMINE HCL 100 MG/ML IJ SOLN
100.0000 mg | Freq: Every day | INTRAMUSCULAR | Status: DC
  Filled 2012-02-18: qty 1

## 2012-02-18 MED ORDER — ONDANSETRON HCL 4 MG PO TABS: 4.0000 mg | ORAL_TABLET | Freq: Four times a day (QID) | ORAL | Status: DC | PRN

## 2012-02-18 MED ORDER — NITROGLYCERIN 0.4 MG/HR TD PT24
0.4000 mg | MEDICATED_PATCH | Freq: Every day | TRANSDERMAL | Status: DC
  Administered 2012-02-18: 0.4 mg via TRANSDERMAL
  Filled 2012-02-18: qty 1

## 2012-02-18 NOTE — Consult Note (Signed)
CARDIOLOGY CONSULT NOTE  Patient ID: [name], MRN: [id number], DOB/AGE: 05-23-1961 51 y.o. Admit date: 02/17/2012 Date of Consult: 02/18/2012  Primary Cardiologist: Dr. Eden Emms  Chief Complaint: palpitations  HPI: Mr. [name] is a 51 y/o M with hx of HTN, excessive EtOH use who presented to [facility] with palpitations. We first met him in July 2012 after an episode of near-syncope preceded by tachypalpitations, after which he ruled out for MI & had a relatively unremarkable 2D echo. He has done well since that time, but last night around 8:15pm he developed gradual onset of "building" palpitations, which started out as irregular and eventually progressed to a racing heart. He stepped outside in the backyard to try and walk it off but "couldn't catch his breath" so his wife called 911. He denies any chest pain or pressure but did feel a fleeting in his mid-back. By the time EMS arrived, the palpitations had already subsided and he was taken to the hospital. The episode lasted 3 minutes and was not associated with any dizziness, lightheadness, pre-syncope, visual changes. He reports a similar episode about 3 months ago that also resolved spontaneously. He has not had any episodes of syncope. In the ER, EKG was abnormal showing NSR with ST elevation I & avL and TWI III, avF. Initially code STEMI was called but per report, f/u EKG had normalized and after review by Dr. Sharyn Lull & cardiology fellow, this was called off (I was unable to locate this EKG in Epic and it is not in the ER MUSE system). F/u EKG today appears improved with subtle ST upsloping I & avL, slight TWI in III but other T changes have resolved. He received 324mg  of ASA and was started on heparin. The patient denies any exertional CP or SOB with walking or ADLs, but does not typically exercise at all. He denies any other precipitating factors including recent illness, fevers, chills, nausea or vomiting. Cardiac enzymes are  negative x 2. D-dimer, LFTs, UDS were negative. The patient remains comfortable and pain free and has not had recurrence of palpitations since admission, with tele only showing NSR. Glucose is elevated at 115-147.  Past Medical History  Diagnosis Date  . Near syncope     05/2011 with tachypalpitations & with echo showing mild LVH, normal EF 55-60%, trivial AR  . Hypertension   . Heart murmur     Longstanding. Mild aortic valve thickening with trivial AR  . Arthritis     "knees; left hand"  . Excessive drinking alcohol     Admitted to 2-3 40oz beers every other night 02/2012      Most Recent Cardiac Studies: 05/27/12 2D Echo Study Conclusions - Left ventricle: The cavity size was normal. Wall thickness was increased in a pattern of mild LVH. Systolic function was normal. The estimated ejection fraction was in the range of 55% to 60%. Wall motion was normal; there were no regional wall motion abnormalities. - Aortic valve: Trivial regurgitation. - Atrial septum: No defect or patent foramen ovale was identified.   Surgical History:  Past Surgical History  Procedure Date  . No past surgeries      Home Meds: Prior to Admission medications   Medication Sig Start Date End Date Taking? Authorizing Provider  acetaminophen (TYLENOL) 500 MG tablet Take 1,000 mg by mouth every 6 (six) hours as needed. For pain   Yes Historical Provider, MD  naproxen (NAPROSYN) 500 MG tablet Take 500 mg by mouth 2 (two) times daily  as needed. For pain 12/27/11 12/26/12 Yes Dayton Bailiff, MD  Omega-3 Fatty Acids (FISH OIL) 1200 MG CAPS Take 1,200 mg by mouth daily.   Yes Historical Provider, MD    Inpatient Medications:     . aspirin  324 mg Oral Once  . aspirin EC  325 mg Oral Daily  . heparin      . heparin  4,000 Units Intravenous Once  . nitroGLYCERIN  0.4 mg Transdermal Daily  . pantoprazole  40 mg Oral Q1200  . pneumococcal 23 valent vaccine  0.5 mL Intramuscular Once  . sodium chloride  3 mL  Intravenous Q12H  . DISCONTD: heparin  60 Units/kg Intravenous STAT    Allergies: No Known Allergies  History   Social History  . Marital Status: Married    Spouse Name: N/A    Number of Children: N/A  . Years of Education: N/A   Occupational History  . Not on file.   Social History Main Topics  . Smoking status: Never Smoker   . Smokeless tobacco: Never Used  . Alcohol Use: 0.0 oz/week     Admitted to 2-3 40oz beers every other night   . Drug Use: No  . Sexually Active: Yes   Other Topics Concern  . Not on file   Social History Narrative   Married. 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 Is from [place].      Family History  Problem Relation Age of Onset  . Cancer Mother     Possibly pancreatic. Died at age 32  Father, brother, and sister in good health to the best of his knowledge. No known hx heart disease.  Review of Systems: General: negative for chills, fever, night sweats or weight changes.  Cardiovascular: see above Dermatological: negative for rash Respiratory: negative for cough or wheezing. + SOB with palpitations Urologic: negative for hematuria Abdominal: negative for nausea, vomiting, diarrhea, bright red blood per rectum, melena, or hematemesis Neurologic: negative for visual changes, syncope, or dizziness All other systems reviewed and are otherwise negative except as noted above.  Labs:  [facility] 02/18/12 0907 02/18/12 0157  CKTOTAL 155 169  CKMB 3.1 3.1  TROPONINI <0.30 <0.30   Lab Results  Component Value Date   WBC 7.1 02/18/2012   HGB 13.8 02/18/2012   HCT 41.5 02/18/2012   MCV 83.8 02/18/2012   PLT 210 02/18/2012     Lab 02/18/12 0600  NA 139  K 3.9  CL 104  CO2 23  BUN 16  CREATININE 1.05  CALCIUM 9.0  PROT 6.4  BILITOT 0.3  ALKPHOS 57  ALT 14  AST 12  GLUCOSE 115*  Of note his PTT was mildly elevated at baseline but this appears to have been drawn after initial administration  of heparin.  Lab Results  Component Value Date   DDIMER <0.22 02/18/2012    Radiology/Studies:  1. Chest Port 1 View 02/17/2012  *RADIOLOGY REPORT*  Clinical Data: Codes stem knee.  Lightheaded.  Irregular heart rate.  PORTABLE CHEST - 1 VIEW  Comparison: Seven 14,012  Findings: Stable borderline cardiomegaly.  Normal mediastinal and hilar contours.  Pulmonary vascularity normal.  No evidence of edema, focal airspace disease, or pleural effusion.  Negative for pneumothorax.  No acute bony abnormality.  IMPRESSION: Stable borderline cardiomegaly.  No acute findings identified.  Original Report Authenticated By: Britta Mccreedy, M.D.   EKG:  02/17/12 21:38:16 & 21:38:54 - NSR ST upsloping/elevation I, avF with TWI  III, avF, biphasic T's V6 - this is changed from prior EKG 02/18/12 11:31am - sinus bradycardia with subtle ST upsloping I, avL, TWI III, other T wave changes resolved  Physical Exam: Blood pressure 156/61, pulse 64, temperature 97.9 F (36.6 C), temperature source Oral, resp. rate 20, weight 215 lb 2.7 oz (97.6 kg), SpO2 98.00%. General: Well developed, well nourished, in no acute distress. Head: Normocephalic, atraumatic, sclera non-icteric, no xanthomas, nares are without discharge.  Neck: Negative for carotid bruits. JVD not elevated. Lungs: Clear bilaterally to auscultation without wheezes, rales, or rhonchi. Breathing is unlabored. Heart: RRR with S1 S2. Soft 2/6 short SEM. No rubs or gallops appreciated. Abdomen: Soft, non-tender, non-distended with normoactive bowel sounds. No hepatomegaly. No rebound/guarding. No obvious abdominal masses. Msk:  Strength and tone appear normal for age. Extremities: No clubbing or cyanosis. No edema.  Distal pedal pulses are 2+ and equal bilaterally. Neuro: Alert and oriented X 3. Moves all extremities spontaneously. Psych:  Responds to questions appropriately with a normal affect.   Assessment and Plan:   1. Recurrent tachypalpitations  (initially developed 05/2011 with near syncope with 2D echo demonstrating normal EF at that time) 2. Markedly abnormal admission EKG 3. HTN 4. Excessive EtOH use 5. Hyperglycemia  His history of tachypalpitations is concerning for arrhythmia outside of the hospital, possibly afib vs SVT vs ventricular. Unfortunately this is only the third episode he has had since July 2012, so Holter monitoring may be low yield but would follow on telemetry for now and consider event monitoring as an outpatient. His EKG ST/T segments were also markedly abnormal on admission raising a question of ischemia, but enzymes are negative x 2. Tentatively we would recommend outpatient stress testing, but will await completion of cycling enzymes.  For his blood pressure, would recommend initiation of Norvasc 5mg . His HR is a limiting factor.   With regard to his EtOH, I see plans per primary team to use PRN Ativan but do not see that order had been placed. Will place him on the CIWA protocol to monitor for withdrawal. We discussed cutting down on his alcohol, and he would benefit from having primary care follow up arranged at discharge. Will also send off for HgbA1c with next lab draw for possibility of undiagnosed diabetes.   Signed, Ronie Spies PA-C 02/18/2012, 11:48 AM   History reviewed with the patient, no changes to be made.  He has no prior cardiac history. He called EMS mostly because of palpitations. However, he did describe chest tightness with this event.  Initial EKG was abnormal with T wave inversion in the inferior leads.  However, there was some baseline "wander" (artifact) and a follow up EKG these changes were resolved.  He has had no objective evidence of ischemia and no arrhythmias on telemetry.  The patient exam reveals Lungs clear, COR RRR, Abd obese, Ext no edema.  All available labs, radiology testing, previous records reviewed. Agree with documented assessment and plan. He does need an out patient stress  test if his enzymes remain negative.  We could consider an out patient event monitor if he has further symptoms.  At present he has had too few events to justify this.  Could be discharged if enzymes are negative and we will arrange follow up.  Fayrene Fearing Jamell Opfer  2:47 PM 09/19/2011

## 2012-02-18 NOTE — ED Notes (Signed)
Report given, preparing to transport to 4741, denies pain or other sx, states, "feels about the same as on arrival, no changes".

## 2012-02-18 NOTE — Progress Notes (Addendum)
[facility] PROGRESS NOTE  [name] ZOX:[id number] DOB: 06-07-1961 DOA: 02/17/2012 PCP: Sheila Oats, MD, MD Cardiologist: Charlton Haws, MD  Assessment/Plan: 1. Chest pain with EKG changes: Remains without recurrence. By report EKG changes resolved, however second EKG not available for review. Aspirin. 2-D echocardiogram pending. Further evaluation per cardiology consultation.  2. History of alcohol abuse: Monitor for withdrawal. 3. History of syncope with negative evaluation in the past with [name] cardiology.  Code Status: Full Family Communication: None at bedside Disposition Plan: Per cardiology  Brendia Sacks, MD  [facility] Pager [phone]  If 7PM-7AM, please contact night-coverage [email or web address] Password [email or web address] 02/18/2012, 8:32 AM   LOS: 1 day   Brief narrative: 51 year old man presented emergency department after some chest pain at home roughly 8 PM. Pain began while he was walking. Noted to have ST elevation in the lateral and anterior leads as well as T-wave inversion in inferior leads. Code STEMI activated. Seen by Dr. Sharyn Lull with cardiology fellow and [facility] STEMI was canceled.  Chart review:  05/18/2011 hospitalization: Syncope. Outpatient evaluation with cardiology  Past medical history: Hypertension  Consultants:  Cardiology  Procedures:  2-D echocardiogram  Interim History: Afebrile. Vital signs stable.  Subjective: No chest pain. No complaints.  Objective: Filed Vitals:   02/18/12 0030 02/18/12 0045 02/18/12 0158 02/18/12 0701  BP: 149/80 150/79 155/74 160/86  Pulse: 57 53 55 57  Temp:   97.6 F (36.4 C) 98.1 F (36.7 C)  TempSrc:      Resp: 15 17 16 16   Weight:   97.6 kg (215 lb 2.7 oz)   SpO2: 100% 100% 100% 99%    Intake/Output Summary (Last 24 hours) at 02/18/12 0832 Last data filed at 02/18/12 0701  Gross per 24 hour  Intake  669.4 ml  Output    700 ml  Net  -30.6 ml     Exam:   General:  Appears calm and comfortable.  Cardiovascular: Regular rate and rhythm. No rub or gallop. 3/6 systolic murmur. No lower extremity edema.  Telemetry: Sinus rhythm. No acute changes.  Respiratory: Clear to auscultation bilaterally. No wheezes, rales, rhonchi. Normal respiratory effort.  Psychiatric: Grossly normal mood and affect. Speech fluent and appropriate.  Data Reviewed: Basic Metabolic Panel:  Lab 02/18/12 8295 02/17/12 2213 02/17/12 2144  NA 139 140 136  K 3.9 3.8 --  CL 104 107 103  CO2 23 -- 20  GLUCOSE 115* 147* 138*  BUN 16 21 19   CREATININE 1.05 1.20 1.02  CALCIUM 9.0 -- 9.2  MG -- -- --  PHOS -- -- --   Liver Function Tests:  Lab 02/18/12 0600 02/17/12 2144  AST 12 15  ALT 14 16  ALKPHOS 57 62  BILITOT 0.3 0.2*  PROT 6.4 7.3  ALBUMIN 3.5 3.8    Lab 02/18/12 0600  LIPASE 23  AMYLASE --   CBC:  Lab 02/18/12 0600 02/17/12 2213 02/17/12 2144  WBC 7.1 -- 7.7  NEUTROABS -- -- 4.3  HGB 13.8 16.3 14.8  HCT 41.5 48.0 45.1  MCV 83.8 -- 84.8  PLT 210 -- 201   Cardiac Enzymes:  Lab 02/18/12 0157  CKTOTAL 169  CKMB 3.1  CKMBINDEX --  TROPONINI <0.30   Studies: Dg Chest Port 1 View  02/17/2012  *RADIOLOGY REPORT*  Clinical Data: Codes stem knee.  Lightheaded.  Irregular heart rate.  PORTABLE CHEST - 1 VIEW  Comparison: Seven 14,012  Findings: Stable borderline cardiomegaly.  Normal mediastinal and hilar contours.  Pulmonary vascularity  normal.  No evidence of edema, focal airspace disease, or pleural effusion.  Negative for pneumothorax.  No acute bony abnormality.  IMPRESSION: Stable borderline cardiomegaly.  No acute findings identified.  Original Report Authenticated By: Britta Mccreedy, M.D.   Scheduled Meds:   . aspirin  324 mg Oral Once  . aspirin EC  325 mg Oral Daily  . heparin      . heparin  4,000 Units Intravenous Once  . nitroGLYCERIN  0.4 mg Transdermal Daily  . pantoprazole  40 mg Oral Q1200  . pneumococcal 23  valent vaccine  0.5 mL Intramuscular Once  . sodium chloride  3 mL Intravenous Q12H  . DISCONTD: heparin  60 Units/kg Intravenous STAT   Continuous Infusions:   . sodium chloride 50 mL/hr at 02/18/12 0143  . heparin 1,200 Units/hr (02/17/12 2219)  . DISCONTD: sodium chloride 20 mL/hr at 02/17/12 2203

## 2012-02-18 NOTE — Progress Notes (Signed)
ANTICOAGULATION CONSULT NOTE - Follow Up Consult  Pharmacy Consult for heparin Indication: chest pain/ACS  Labs:  Basename 02/18/12 0600 02/18/12 0157 02/17/12 2213 02/17/12 2144  HGB 13.8 -- 16.3 --  HCT 41.5 -- 48.0 45.1  PLT 210 -- -- 201  APTT 59* -- -- 46*  LABPROT -- -- -- 13.7  INR -- -- -- 1.03  HEPARINUNFRC 0.35 -- -- --  CREATININE -- -- 1.20 1.02  CKTOTAL -- 169 -- --  CKMB -- 3.1 -- --  TROPONINI -- <0.30 -- --    Assessment/Plan: 51yo male therapeutic on heparin with initial dosing for ACS.  Will continue heparin at current rate and confirm stable with next CE.   Colleen Can PharmD BCPS 02/18/2012,7:22 AM

## 2012-02-18 NOTE — Progress Notes (Signed)
ANTICOAGULATION CONSULT NOTE - Follow Up Consult  Pharmacy Consult for heparin Indication: chest pain/ACS  No Known Allergies  Patient Measurements: Height: 5\' 7"  (170.2 cm) Weight: 215 lb 2.7 oz (97.6 kg) (b scale) IBW/kg (Calculated) : 66.1  Heparin Dosing Weight: 88kg  Vital Signs: Temp: 97.9 F (36.6 C) (04/17 0930) Temp src: Oral (04/17 0930) BP: 156/61 mmHg (04/17 0930) Pulse Rate: 64  (04/17 0930)  Labs:  Basename 02/18/12 1254 02/18/12 0907 02/18/12 0600 02/18/12 0157 02/17/12 2213 02/17/12 2144  HGB -- -- 13.8 -- 16.3 --  HCT -- -- 41.5 -- 48.0 45.1  PLT -- -- 210 -- -- 201  APTT -- -- 59* -- -- 46*  LABPROT -- -- -- -- -- 13.7  INR -- -- -- -- -- 1.03  HEPARINUNFRC 0.25* -- 0.35 -- -- --  CREATININE -- -- 1.05 -- 1.20 1.02  CKTOTAL -- 155 -- 169 -- --  CKMB -- 3.1 -- 3.1 -- --  TROPONINI -- <0.30 -- <0.30 -- --   Estimated Creatinine Clearance: 92.6 ml/min (by C-G formula based on Cr of 1.05).  Medications:  Infusions:    . sodium chloride 50 mL/hr at 02/18/12 0143  . heparin    . DISCONTD: sodium chloride 20 mL/hr at 02/17/12 2203  . DISCONTD: heparin 1,200 Units/hr (02/17/12 2219)   Assessment: 50 yom on IV heparin for possible ACS. Heparin level was therapeutic this AM but now below goal at 0.25. No bleeding noted. CBC WNL. No definitely cardiology plan yet.   Goal of Therapy:  Heparin level 0.3-0.7 units/ml   Plan:  1. Increase heparin gtt to 1300units/hr 2. Heparin level at 2100 tonight 3. F/u cardiology plan  Stephone Gum, Drake Leach 02/18/2012,1:38 PM

## 2012-02-18 NOTE — Discharge Summary (Signed)
Physician Discharge Summary  [name] ZOX:[id number] DOB: 05/31/1961 DOA: 02/17/2012  PCP: Herbert Oats, MD, MD Cardiologist: Herbert Haws, MD  Admit date: 02/17/2012 Discharge date: 02/18/2012  Discharge Diagnoses:  1. Chest pain with EKG changes 2. History of alcohol abuse 3. History of syncope  Discharge Condition: Improved  Disposition: Home  History of present illness:  51 year old man presented emergency department after some chest pain at home roughly 8 PM. Pain began while he was walking. Noted to have ST elevation in the lateral and anterior leads as well as T-wave inversion in inferior leads. Code STEMI activated. Seen by Dr. Sharyn Hernandez with cardiology fellow and Code STEMI was canceled.  Hospital Course:  [name] was admitted to the medical floor. He had no recurrence of chest pain. He remained asymptomatic and ruled out with your cardiac enzymes. He seen by cardiology who is recommended discharge home. They will coordinate outpatient followup with him including the outpatient nuclear stress testing and 2-D echocardiogram. The patient remains pain-free and would like to go home. 1. Chest pain with EKG changes: Remains without recurrence. Cleared for discharge per cardiology. Nuclear stress testing and 2-D echocardiogram deferred to the outpatient setting.    2. History of alcohol abuse: No evidence of withdrawal.  3. History of syncope with negative evaluation in the past with [facility] cardiology.  Consultants:  Cardiology  Discharge Instructions  Discharge Orders    Future Orders Please Complete By Expires   Diet - low sodium heart healthy      Activity as tolerated - No restrictions      Discharge instructions      Comments:   [facility] will contact you for followup. Return to emergency department or call your physician if you have recurrent chest pain.   Call MD for:  severe uncontrolled pain        Medication List  As of 02/18/2012  7:19 PM   TAKE these medications         acetaminophen 500 MG tablet   Commonly known as: TYLENOL   Take 1,000 mg by mouth every 6 (six) hours as needed. For pain      aspirin EC 81 MG tablet   Take 1 tablet (81 mg total) by mouth daily.      Fish Oil 1200 MG Caps   Take 1,200 mg by mouth daily.      lisinopril 10 MG tablet   Commonly known as: PRINIVIL,ZESTRIL   Take 1 tablet (10 mg total) by mouth daily.      naproxen 500 MG tablet   Commonly known as: NAPROSYN   Take 500 mg by mouth 2 (two) times daily as needed. For pain      nitroGLYCERIN 0.4 MG SL tablet   Commonly known as: NITROSTAT   Place 1 tablet (0.4 mg total) under the tongue every 5 (five) minutes as needed for chest pain.           Follow-up Information    Follow up with DEFAULT,PROVIDER, MD in 2 weeks.   Contact information:   [address] [place] [address] [phone]          The results of significant diagnostics from this hospitalization (including imaging, microbiology, ancillary and laboratory) are listed below for reference.    Significant Diagnostic Studies: Dg Chest Port 1 View  02/17/2012  *RADIOLOGY REPORT*  Clinical Data: Codes stem knee.  Lightheaded.  Irregular heart rate.  PORTABLE CHEST - 1 VIEW  Comparison: Seven 14,012  Findings: Stable borderline cardiomegaly.  Normal mediastinal and hilar contours.  Pulmonary vascularity normal.  No evidence of edema, focal airspace disease, or pleural effusion.  Negative for pneumothorax.  No acute bony abnormality.  IMPRESSION: Stable borderline cardiomegaly.  No acute findings identified.  Original Report Authenticated By: [name], M.D.   Labs: Basic Metabolic Panel:  Lab 02/18/12 1610 02/17/12 2213 02/17/12 2144  NA 139 140 136  K 3.9 3.8 --  CL 104 107 103  CO2 23 -- 20  GLUCOSE 115* 147* 138*  BUN 16 21 19   CREATININE 1.05 1.20 1.02  CALCIUM 9.0 -- 9.2  MG -- -- --  PHOS -- -- --   Liver Function Tests:  Lab 02/18/12  0600 02/17/12 2144  AST 12 15  ALT 14 16  ALKPHOS 57 62  BILITOT 0.3 0.2*  PROT 6.4 7.3  ALBUMIN 3.5 3.8    Lab 02/18/12 0600  LIPASE 23  AMYLASE --   CBC:  Lab 02/18/12 0600 02/17/12 2213 02/17/12 2144  WBC 7.1 -- 7.7  NEUTROABS -- -- 4.3  HGB 13.8 16.3 14.8  HCT 41.5 48.0 45.1  MCV 83.8 -- 84.8  PLT 210 -- 201   Cardiac Enzymes:  Lab 02/18/12 1710 02/18/12 0907 02/18/12 0157  CKTOTAL 135 155 169  CKMB 2.9 3.1 3.1  CKMBINDEX -- -- --  TROPONINI <0.30 <0.30 <0.30   Time coordinating discharge: 35 minutes.  Signed:  Brendia Sacks, MD  [facility] 02/18/2012, 7:19 PM

## 2012-02-18 NOTE — Progress Notes (Signed)
   CARE MANAGEMENT NOTE 02/18/2012  Patient:  [name],[name]   Account Number:  [id number]  Date Initiated:  02/18/2012  Documentation initiated by:  Donn Pierini  Subjective/Objective Assessment:   Pt admitted with chest pain     Action/Plan:   PTA pt lived at home with family, independent with ADLS   Anticipated DC Date:  02/19/2012   Anticipated DC Plan:  HOME/SELF CARE      DC Planning Services  CM consult      Choice offered to / List presented to:             Status of service:  In process, will continue to follow Medicare Important Message given?   (If response is "NO", the following Medicare IM given date fields will be blank) Date Medicare IM given:   Date Additional Medicare IM given:    Discharge Disposition:    Per UR Regulation:    If discussed at Long Length of Stay Meetings, dates discussed:    Comments:  02/18/12- 1620- Donn Pierini RN, BSN [phone] Spoke with pt at bedside- per conversation pt lives at home, does not have a PCP- but is interested in info regarding finding a PCP- info given to pt on [facility]- which is accepting pt at this time- cost $45 to see doctor. Also gave pt on Other clinics that sometimes take pt without insurance but usually cost more. Pt states that he uses [facility] for medication needs- checked with pharmacy - pt is eligible for assistance with medications if needed at discharge. CM to follow.

## 2012-02-18 NOTE — Plan of Care (Signed)
Problem: Phase II Progression Outcomes Goal: Stress Test if indicated Outcome: Adequate for Discharge Pt will have this outpatient per MD orders.

## 2012-02-18 NOTE — H&P (Addendum)
[name] is an 51 y.o. male.   PCP - None. Chief Complaint: Chest pain. HPI: 86 old male was brought to the ER after patient experienced chest pain at home around 8:00 PM last evening. Patient was watching television after which he started walking toward the back yard when he started to develop sudden onset of chest pain which was retrosternal pressure-like nonradiating increased on exertion with no associated shortness of breath, diaphoresis or palpitations or nausea vomiting. He immediately called the EMS and initially EKG was concerning for ST elevation but repeat one was normal and cardiologist canceled the code STEMI. Cardiac enzymes have been negative and chest pain has not recurred. Patient has been started on heparin drip. At this time hospitalist has been requested to admit for further management. Patient denies any nausea vomiting abdominal pain or any diarrhea fever chills or cough. Denies any headache or focal deficits.  Past Medical History  Diagnosis Date  . Syncope     near  . Palpitations   . Hypertension     Past Surgical History  Procedure Date  . No past surgeries     History reviewed. No pertinent family history. Social History:  reports that he has never smoked. He does not have any smokeless tobacco history on file. He reports that he drinks alcohol. He reports that he does not use illicit drugs.  Allergies: No Known Allergies  Medications Prior to Admission  Medication Dose Route Frequency Provider Last Rate Last Dose  . 0.9 %  sodium chloride infusion   Intravenous Continuous Ethelda Chick, MD 20 mL/hr at 02/17/12 2203    . aspirin chewable tablet 324 mg  324 mg Oral Once Ethelda Chick, MD   324 mg at 02/17/12 2159  . heparin 5000 UNIT/ML injection        4,000 Units at 02/17/12 2158  . heparin ADULT infusion 100 units/mL (25000 units/250 mL)  1,200 Units/hr Intravenous Continuous Elonda Husky, PHARMD 12 mL/hr at 02/17/12 2219 1,200 Units/hr at  02/17/12 2219  . heparin bolus via infusion 4,000 Units  4,000 Units Intravenous Once Elonda Husky, PHARMD      . DISCONTD: heparin injection 60 Units/kg  60 Units/kg Intravenous STAT Ethelda Chick, MD      . DISCONTD: morphine 2 MG/ML injection            Medications Prior to Admission  Medication Sig Dispense Refill  . acetaminophen (TYLENOL) 500 MG tablet Take 1,000 mg by mouth every 6 (six) hours as needed. For pain      . naproxen (NAPROSYN) 500 MG tablet Take 500 mg by mouth 2 (two) times daily as needed. For pain      . Omega-3 Fatty Acids (FISH OIL) 1200 MG CAPS Take 1,200 mg by mouth daily.        Results for orders placed during the hospital encounter of 02/17/12 (from the past 48 hour(s))  CBC     Status: Normal   Collection Time   02/17/12  9:44 PM      Component Value Range Comment   WBC 7.7  4.0 - 10.5 (K/uL)    RBC 5.32  4.22 - 5.81 (MIL/uL)    Hemoglobin 14.8  13.0 - 17.0 (g/dL)    HCT 40.9  81.1 - 91.4 (%)    MCV 84.8  78.0 - 100.0 (fL)    MCH 27.8  26.0 - 34.0 (pg)    MCHC 32.8  30.0 - 36.0 (g/dL)  RDW 13.2  11.5 - 15.5 (%)    Platelets 201  150 - 400 (K/uL)   DIFFERENTIAL     Status: Normal   Collection Time   02/17/12  9:44 PM      Component Value Range Comment   Neutrophils Relative 55  43 - 77 (%)    Neutro Abs 4.3  1.7 - 7.7 (K/uL)    Lymphocytes Relative 33  12 - 46 (%)    Lymphs Abs 2.5  0.7 - 4.0 (K/uL)    Monocytes Relative 9  3 - 12 (%)    Monocytes Absolute 0.7  0.1 - 1.0 (K/uL)    Eosinophils Relative 3  0 - 5 (%)    Eosinophils Absolute 0.2  0.0 - 0.7 (K/uL)    Basophils Relative 0  0 - 1 (%)    Basophils Absolute 0.0  0.0 - 0.1 (K/uL)   APTT     Status: Abnormal   Collection Time   02/17/12  9:44 PM      Component Value Range Comment   aPTT 46 (*) 24 - 37 (seconds)   COMPREHENSIVE METABOLIC PANEL     Status: Abnormal   Collection Time   02/17/12  9:44 PM      Component Value Range Comment   Sodium 136  135 - 145 (mEq/L)     Potassium 3.9  3.5 - 5.1 (mEq/L)    Chloride 103  96 - 112 (mEq/L)    CO2 20  19 - 32 (mEq/L)    Glucose, Bld 138 (*) 70 - 99 (mg/dL)    BUN 19  6 - 23 (mg/dL)    Creatinine, Ser 1.61  0.50 - 1.35 (mg/dL)    Calcium 9.2  8.4 - 10.5 (mg/dL)    Total Protein 7.3  6.0 - 8.3 (g/dL)    Albumin 3.8  3.5 - 5.2 (g/dL)    AST 15  0 - 37 (U/L) SLIGHT HEMOLYSIS   ALT 16  0 - 53 (U/L)    Alkaline Phosphatase 62  39 - 117 (U/L)    Total Bilirubin 0.2 (*) 0.3 - 1.2 (mg/dL)    GFR calc non Af Amer 83 (*) >90 (mL/min)    GFR calc Af Amer >90  >90 (mL/min)   PROTIME-INR     Status: Normal   Collection Time   02/17/12  9:44 PM      Component Value Range Comment   Prothrombin Time 13.7  11.6 - 15.2 (seconds)    INR 1.03  0.00 - 1.49    POCT I-STAT TROPONIN I     Status: Normal   Collection Time   02/17/12 10:11 PM      Component Value Range Comment   Troponin i, poc 0.00  0.00 - 0.08 (ng/mL)    Comment 3            POCT I-STAT, CHEM 8     Status: Abnormal   Collection Time   02/17/12 10:13 PM      Component Value Range Comment   Sodium 140  135 - 145 (mEq/L)    Potassium 3.8  3.5 - 5.1 (mEq/L)    Chloride 107  96 - 112 (mEq/L)    BUN 21  6 - 23 (mg/dL)    Creatinine, Ser 0.96  0.50 - 1.35 (mg/dL)    Glucose, Bld 045 (*) 70 - 99 (mg/dL)    Calcium, Ion 4.09  1.12 - 1.32 (mmol/L)    TCO2 22  0 - 100 (mmol/L)    Hemoglobin 16.3  13.0 - 17.0 (g/dL)    HCT 16.1  09.6 - 04.5 (%)    Dg Chest Port 1 View  02/17/2012  *RADIOLOGY REPORT*  Clinical Data: Codes stem knee.  Lightheaded.  Irregular heart rate.  PORTABLE CHEST - 1 VIEW  Comparison: Seven 14,012  Findings: Stable borderline cardiomegaly.  Normal mediastinal and hilar contours.  Pulmonary vascularity normal.  No evidence of edema, focal airspace disease, or pleural effusion.  Negative for pneumothorax.  No acute bony abnormality.  IMPRESSION: Stable borderline cardiomegaly.  No acute findings identified.  Original Report Authenticated By: Britta Mccreedy, M.D.    Review of Systems  Constitutional: Negative.   HENT: Negative.   Eyes: Negative.   Respiratory: Negative.   Cardiovascular: Positive for chest pain.  Gastrointestinal: Negative.   Genitourinary: Negative.   Musculoskeletal: Negative.   Skin: Negative.   Neurological: Negative.   Endo/Heme/Allergies: Negative.   Psychiatric/Behavioral: Negative.     Blood pressure 142/54, pulse 63, temperature 98 F (36.7 C), temperature source Oral, resp. rate 18, SpO2 100.00%. Physical Exam  Constitutional: He is oriented to person, place, and time. He appears well-developed and well-nourished. No distress.  HENT:  Head: Normocephalic and atraumatic.  Right Ear: External ear normal.  Left Ear: External ear normal.  Nose: Nose normal.  Mouth/Throat: Oropharynx is clear and moist. No oropharyngeal exudate.  Eyes: Conjunctivae are normal. Pupils are equal, round, and reactive to light. Right eye exhibits no discharge. Left eye exhibits no discharge. No scleral icterus.  Neck: Normal range of motion. Neck supple.  Cardiovascular: Normal rate, regular rhythm and normal heart sounds.   Respiratory: Effort normal and breath sounds normal. No respiratory distress. He has no wheezes. He has no rales.  GI: Soft. Bowel sounds are normal. He exhibits no distension. There is no tenderness. There is no rebound.  Musculoskeletal: Normal range of motion. He exhibits no edema and no tenderness.  Neurological: He is alert and oriented to person, place, and time.       Moves all extremities.  Skin: Skin is warm and dry. No rash noted. He is not diaphoretic. No erythema.  Psychiatric: His behavior is normal.     Assessment/Plan #1. Chest pain to rule out ACS - cycle cardiac markers, nitroglycerin patch, aspirin, 2-D echo. I have kept patient n.p.o. past midnight if any procedure planned later.  #2. Alcohol abuse - have placed on as needed Ativan for any withdrawal symptoms. #3. History of  syncope with negative workup last year with [facility].  We will check lipase. We'll also recheck PTT as it is mildly elevated. CODE STATUS - full code.  Aavya Shafer N. 02/18/2012, 12:30 AM

## 2012-02-18 NOTE — Progress Notes (Signed)
Utilization review complete 

## 2012-02-18 NOTE — Progress Notes (Signed)
Patient admitted from [facility] ED for further evaluation of chest pain and palpitations.  Arrived to unit via stretcher, alert and oriented x4, capable of ambulating from stretcher to bed without complications.  He denies chest pain and shortness of breath on arrival.  Heparin infusing at 1200units/hr on arrival.  Discussed falls safety with patient, bed alarm activated.  Placed on telemetry and oriented to room and floor procedures.  Resting quietly at present, will continue to monitor patient condition.  Idolina Primer RN

## 2012-02-19 ENCOUNTER — Telehealth: Payer: Self-pay | Admitting: Cardiovascular Disease

## 2012-02-19 NOTE — Telephone Encounter (Signed)
Patient returning nurse call, he can be reached at [phone]

## 2012-02-19 NOTE — Telephone Encounter (Signed)
Nursing did not try to call this patient. He was just d/c'ed from [facility]. Will send back to scheduling to see if they tried to call him for a post hospital appointment.

## 2012-02-23 ENCOUNTER — Ambulatory Visit (HOSPITAL_COMMUNITY): Payer: Self-pay | Attending: Internal Medicine | Admitting: Radiology

## 2012-02-23 VITALS — BP 152/68 | Ht 67.0 in | Wt 214.0 lb

## 2012-02-23 DIAGNOSIS — R0602 Shortness of breath: Secondary | ICD-10-CM

## 2012-02-23 DIAGNOSIS — R079 Chest pain, unspecified: Secondary | ICD-10-CM | POA: Insufficient documentation

## 2012-02-23 DIAGNOSIS — I10 Essential (primary) hypertension: Secondary | ICD-10-CM | POA: Insufficient documentation

## 2012-02-23 DIAGNOSIS — R002 Palpitations: Secondary | ICD-10-CM | POA: Insufficient documentation

## 2012-02-23 DIAGNOSIS — R9431 Abnormal electrocardiogram [ECG] [EKG]: Secondary | ICD-10-CM | POA: Insufficient documentation

## 2012-02-23 MED ORDER — TECHNETIUM TC 99M TETROFOSMIN IV KIT
30.0000 | PACK | Freq: Once | INTRAVENOUS | Status: AC | PRN
  Administered 2012-02-23: 30 via INTRAVENOUS

## 2012-02-23 MED ORDER — TECHNETIUM TC 99M TETROFOSMIN IV KIT
10.0000 | PACK | Freq: Once | INTRAVENOUS | Status: AC | PRN
  Administered 2012-02-23: 10 via INTRAVENOUS

## 2012-02-23 NOTE — Progress Notes (Signed)
[facility] SITE 3 NUCLEAR MED [address] [place] [address] [phone]  Cardiology Nuclear Med Study  [name] is a 51 y.o. male     MRN : [id number]     DOB: 03/21/1961  Procedure Date: 02/23/2012  Nuclear Med Background Indication for Stress Test:  Evaluation for Ischemia and Post Hospital:02/17/12 with chest pain/tachy palpitations,abnormal EKG and code Stemi called but cancelled due EKG normalized and (-) enzymes History:  05/28/11 ECHO: EF: 55-60% Cardiac Risk Factors: Hypertension  Symptoms:  Chest Pain and Palpitations   Nuclear Pre-Procedure Caffeine/Decaff Intake:  None NPO After: 9:30pm   Lungs:  clear O2 Sat: 100% on room air. IV 0.9% NS with Angio Cath:  20g  IV Site: R Hand  IV Started by:  Cathlyn Parsons, RN  Chest Size (in):  42 Cup Size: n/a  Height: 5\' 7"  (1.702 m)  Weight:  214 lb (97.07 kg)  BMI:  Body mass index is 33.52 kg/(m^2). Tech Comments:  n/a    Nuclear Med Study 1 or 2 day study: 1 day  Stress Test Type:  Stress  Reading MD: Dietrich Pates, MD  Order Authorizing Provider:  Burna Cash  Resting Radionuclide: Technetium 9m Tetrofosmin  Resting Radionuclide Dose: 11.0 mCi   Stress Radionuclide:  Technetium 66m Tetrofosmin  Stress Radionuclide Dose: 32.9 mCi           Stress Protocol Rest HR: 55 Stress HR: 150  Rest BP: 152/68 Stress BP: 217/73  Exercise Time (min): 8:45 METS: 10.10   Predicted Max HR: 169 bpm % Max HR: 88.76 bpm Rate Pressure Product: 21308   Dose of Adenosine (mg):  n/a Dose of Lexiscan: n/a mg  Dose of Atropine (mg): n/a Dose of Dobutamine: n/a mcg/kg/min (at max HR)  Stress Test Technologist: Milana Na, EMT-P  Nuclear Technologist:  Domenic Polite, CNMT     Rest Procedure:  Myocardial perfusion imaging was performed at rest 45 minutes following the intravenous administration of Technetium 30m Tetrofosmin. Rest ECG: NSR - Normal EKG  Stress Procedure:  The patient performed  treadmill exercise using a Bruce  Protocol for 8:45 minutes. The patient stopped due to sob and denied any chest pain.  There were no significant ST-T wave changes.  Technetium 80m Tetrofosmin was injected at peak exercise and myocardial perfusion imaging was performed after a brief delay. Stress ECG: No significant change from baseline ECG  QPS Raw Data Images:  Images were motion corrected.  Soft tissue (diaphragm, bowel activity) underlies the inferior wall. Stress Images:  Normal perfusion Rest Images:  Normal homogeneous uptake in all areas of the myocardium. Subtraction (SDS):  No evidence of ischemia. Transient Ischemic Dilatation (Normal <1.22):  0.94 Lung/Heart Ratio (Normal <0.45):  0.30  Quantitative Gated Spect Images QGS EDV:  116 ml QGS ESV:  48 ml  Impression Exercise Capacity:  Good exercise capacity. BP Response:  Normal blood pressure response. Clinical Symptoms:  No significant symptoms noted. ECG Impression:  No significant ST segment change suggestive of ischemia. Comparison with Prior Nuclear Study: No prior study.  Overall Impression:  Normal stress nuclear study.  LV Ejection Fraction: 58%.  LV Wall Motion:  NL LV Function; NL Wall Motion

## 2012-02-25 ENCOUNTER — Telehealth: Payer: Self-pay | Admitting: Cardiovascular Disease

## 2012-02-25 NOTE — Telephone Encounter (Signed)
Fu call Pt called back again about stress test results

## 2012-02-25 NOTE — Telephone Encounter (Signed)
New msg Pt was calling about test results. Please call back

## 2012-02-25 NOTE — Telephone Encounter (Signed)
PT  AWARE OF  STRESS RESULTS./CY 

## 2012-04-09 ENCOUNTER — Emergency Department (HOSPITAL_COMMUNITY)
Admission: EM | Admit: 2012-04-09 | Discharge: 2012-04-09 | Disposition: A | Discharge status: Home Health | Payer: Self-pay | Attending: Emergency Medicine | Admitting: Emergency Medicine

## 2012-04-09 ENCOUNTER — Emergency Department (HOSPITAL_COMMUNITY): Payer: Self-pay

## 2012-04-09 ENCOUNTER — Encounter (HOSPITAL_COMMUNITY): Payer: Self-pay | Admitting: *Deleted

## 2012-04-09 DIAGNOSIS — R0602 Shortness of breath: Secondary | ICD-10-CM | POA: Insufficient documentation

## 2012-04-09 DIAGNOSIS — R05 Cough: Secondary | ICD-10-CM | POA: Insufficient documentation

## 2012-04-09 DIAGNOSIS — R011 Cardiac murmur, unspecified: Secondary | ICD-10-CM | POA: Insufficient documentation

## 2012-04-09 DIAGNOSIS — I10 Essential (primary) hypertension: Secondary | ICD-10-CM | POA: Insufficient documentation

## 2012-04-09 DIAGNOSIS — M25519 Pain in unspecified shoulder: Secondary | ICD-10-CM | POA: Insufficient documentation

## 2012-04-09 DIAGNOSIS — R079 Chest pain, unspecified: Secondary | ICD-10-CM | POA: Insufficient documentation

## 2012-04-09 DIAGNOSIS — R059 Cough, unspecified: Secondary | ICD-10-CM | POA: Insufficient documentation

## 2012-04-09 DIAGNOSIS — M79609 Pain in unspecified limb: Secondary | ICD-10-CM | POA: Insufficient documentation

## 2012-04-09 DIAGNOSIS — R42 Dizziness and giddiness: Secondary | ICD-10-CM | POA: Insufficient documentation

## 2012-04-09 LAB — BASIC METABOLIC PANEL
BUN: 17 mg/dL (ref 6–23)
Calcium: 9.7 mg/dL (ref 8.4–10.5)
Chloride: 102 mEq/L (ref 96–112)
Creatinine, Ser: 1.08 mg/dL (ref 0.50–1.35)
GFR calc Af Amer: 89 mL/min — ABNORMAL LOW (ref >90)
GFR calc non Af Amer: 77 mL/min — ABNORMAL LOW (ref >90)

## 2012-04-09 LAB — URINALYSIS, ROUTINE W REFLEX MICROSCOPIC
Bilirubin Urine: NEGATIVE
Ketones, ur: NEGATIVE mg/dL
Nitrite: NEGATIVE
Protein, ur: NEGATIVE mg/dL
Specific Gravity, Urine: 1.011 (ref 1.005–1.030)
Urobilinogen, UA: 0.2 mg/dL (ref 0.0–1.0)

## 2012-04-09 LAB — CBC
MCHC: 33.6 g/dL (ref 30.0–36.0)
MCV: 82.8 fL (ref 78.0–100.0)
Platelets: 209 10*3/uL (ref 150–400)
RDW: 13.1 % (ref 11.5–15.5)
WBC: 6.1 10*3/uL (ref 4.0–10.5)

## 2012-04-09 LAB — DIFFERENTIAL
Eosinophils Absolute: 0.2 10*3/uL (ref 0.0–0.7)
Eosinophils Relative: 3 % (ref 0–5)
Lymphs Abs: 1.9 10*3/uL (ref 0.7–4.0)
Monocytes Absolute: 0.3 10*3/uL (ref 0.1–1.0)
Monocytes Relative: 6 % (ref 3–12)

## 2012-04-09 MED ORDER — ASPIRIN 325 MG PO TABS
325.0000 mg | ORAL_TABLET | ORAL | Status: AC
  Administered 2012-04-09: 325 mg via ORAL
  Filled 2012-04-09: qty 1

## 2012-04-09 MED ORDER — NITROGLYCERIN 0.4 MG SL SUBL: 0.4000 mg | SUBLINGUAL_TABLET | SUBLINGUAL | Status: DC | PRN

## 2012-04-09 MED ORDER — LISINOPRIL 10 MG PO TABS: 10.0000 mg | ORAL_TABLET | Freq: Every day | ORAL | Status: DC

## 2012-04-09 NOTE — ED Provider Notes (Signed)
History     CSN: [id number]  Arrival date & time 04/09/12  4098   First MD Initiated Contact with Patient 04/09/12 1007      Chief Complaint  Patient presents with  . Chest Pain  . Arm Pain    (Consider location/radiation/quality/duration/timing/severity/associated sxs/prior treatment) Patient is a 51 y.o. male presenting with chest pain and arm pain. The history is provided by the patient. No language interpreter was used.  Chest Pain Chest pain occurs constantly. The chest pain is improving. At its most intense, the pain is at 6/10. The pain is currently at 3/10. The severity of the pain is mild. The quality of the pain is described as dull. The pain radiates to the left shoulder and left arm. Primary symptoms include shortness of breath, cough and dizziness. Pertinent negatives for primary symptoms include no fever, no syncope, no palpitations, no abdominal pain, no nausea and no vomiting.  Dizziness does not occur with nausea, vomiting, weakness or diaphoresis.   Pertinent negatives for associated symptoms include no claudication, no diaphoresis, no lower extremity edema, no near-syncope, no numbness, no orthopnea, no paroxysmal nocturnal dyspnea and no weakness. He tried nothing for the symptoms. Risk factors include alcohol intake and male gender.  His past medical history is significant for hypertension.  Pertinent negatives for past medical history include no anxiety/panic attacks, no aortic dissection, no cancer, no COPD, no CHF, no diabetes, no DVT, no hyperlipidemia, no MI, no mitral valve prolapse, no pacemaker, no PE, no PVD, no recent injury, no seizures, no sickle cell disease, no strokes and no thyroid problem.  His family medical history is significant for hypertension in family and stroke in family.  Pertinent negatives for family medical history include: no CAD in family, no diabetes in family, no early MI in family, no PE in family, no PVD in family, no sickle cell disease  in family, no sudden death in family and no TIA in family.  Procedure history is positive for exercise treadmill test.  Procedure history is negative for cardiac catheterization, echocardiogram, persantine thallium, stress echo and stress thallium.    Arm Pain Associated symptoms include chest pain and coughing. Pertinent negatives include no abdominal pain, diaphoresis, fever, nausea, numbness, vomiting or weakness.   51 year old male coming in today with complaint of chest pain that started around 7 PM last night. States that the pain has been constant and dull to the left chest below his left nipple. States that around 8 AM this morning the pain radiated into his left upper arm and got worse 6/10. Patient has had an aspirin 325. States that he also felt that his hand felt hot for a few minutes this morning. At that time he has shortness of breath for a few minutes that has resolved now. He has been evaluated by the power in the past for tachycardia heart rate with near-syncope. Patient has never had a cardiac cath. Patient is noncompliant with his heart medications. Positive EtOH in the past but states that he had 4 or 5 beers 4 days ago. No family history of MIs. States he had a cousin had a stroke. The mother that died of cancer. In 2011-05-01 showed an EF of 55%. In April last year he was came in with chest pain and was a STEMI but the cardiologist canceled the current stem he. He was never cath. As a 3/10 presently, Myoview done in April per Dr. Eden Emms normal.  Past Medical History  Diagnosis Date  . Near  syncope     05/2011 with tachypalpitations & with echo showing mild LVH, normal EF 55-60%, trivial AR  . Hypertension   . Heart murmur     Longstanding. 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 Arthritis     "knees; left hand"  . Excessive drinking alcohol     Admitted to 2-3 40oz beers every other night 02/2012    Past Surgical History  Procedure Date  . No past  surgeries     Family History  Problem Relation Age of Onset  . Cancer Mother     Possibly pancreatic. Died at age 63    History  Substance Use Topics  . Smoking status: Never Smoker   . Smokeless tobacco: Never Used  . Alcohol Use: 0.0 oz/week     states he has cut back      Review of Systems  Constitutional: Negative.  Negative for fever and diaphoresis.  HENT: Negative.   Eyes: Negative.   Respiratory: Positive for cough and shortness of breath.   Cardiovascular: Positive for chest pain. Negative for palpitations, orthopnea, claudication, syncope and near-syncope.  Gastrointestinal: Negative.  Negative for nausea, vomiting and abdominal pain.  Neurological: Positive for dizziness. Negative for seizures, weakness and numbness.  Psychiatric/Behavioral: Negative.   All other systems reviewed and are negative.    Allergies  Review of patient's allergies indicates no known allergies.  Home Medications   Current Outpatient Rx  Name Route Sig Dispense Refill  . ACETAMINOPHEN 500 MG PO TABS Oral Take 1,000 mg by mouth every 6 (six) hours as needed. For pain    . VICODIN PO Oral Take 1 tablet by mouth every 6 (six) hours as needed. For pain    . LISINOPRIL 10 MG PO TABS Oral Take 10 mg by mouth daily.    Marland Kitchen NITROGLYCERIN 0.4 MG SL SUBL Sublingual Place 1 tablet (0.4 mg total) under the tongue every 5 (five) minutes as needed for chest pain. 30 tablet 0  . FISH OIL 1200 MG PO CAPS Oral Take 1,200 mg by mouth daily.      BP 140/68  Pulse 59  Temp(Src) 98 F (36.7 C) (Oral)  Resp 18  Ht 5\' 7"  (1.702 m)  Wt 217 lb (98.431 kg)  BMI 33.99 kg/m2  SpO2 100%  Physical Exam  Nursing note and vitals reviewed. Constitutional: He is oriented to person, place, and time. He appears well-developed and well-nourished.  HENT:  Head: Normocephalic.  Eyes: Conjunctivae and EOM are normal. Pupils are equal, round, and reactive to light.  Neck: Normal range of motion. Neck supple.    Cardiovascular: Normal rate and intact distal pulses.   Murmur heard. Pulmonary/Chest: Effort normal and breath sounds normal. No respiratory distress. He has no wheezes. He exhibits no tenderness.  Abdominal: Soft. Bowel sounds are normal. He exhibits no distension. There is no tenderness.  Musculoskeletal: Normal range of motion. He exhibits no edema and no tenderness.  Neurological: He is alert and oriented to person, place, and time.  Skin: Skin is warm and dry.  Psychiatric: He has a normal mood and affect.    ED Course  Procedures (including critical care time)   Labs Reviewed  CBC  POCT I-STAT TROPONIN I  DIFFERENTIAL  BASIC METABOLIC PANEL  URINALYSIS, ROUTINE W REFLEX MICROSCOPIC   Dg Chest 2 View  04/09/2012  *RADIOLOGY REPORT*  Clinical Data: Left chest and arm pain  CHEST - 2 VIEW  Comparison: Portable chest  x-ray of 02/17/2012  Findings: The lungs are clear.  The heart is mildly enlarged and stable.  No bony abnormality is seen.  IMPRESSION: Stable chest x-ray.  No active lung disease.  Borderline cardiomegaly.  Original Report Authenticated By: Juline Patch, M.D.     No diagnosis found.    MDM   Date: 04/09/2012  Rate: 58   Rhythm: sinus bradycardia  QRS Axis: normal  Intervals: normal  ST/T Wave abnormalities: normal  Conduction Disutrbances:none  Narrative Interpretation:   Old EKG Reviewed: changes noted  Atypical chest pain. Trop I and ekg unremarkable.  Cardiology and Hospitalist agree patient can go home and follow up.  Patient agreeable.  Ready for discharge.  Recent myoview unremarkable.   Labs Reviewed  BASIC METABOLIC PANEL - Abnormal; Notable for the following:    Glucose, Bld 102 (*)    GFR calc non Af Amer 77 (*)    GFR calc Af Amer 89 (*)    All other components within normal limits  CBC  URINALYSIS, ROUTINE W REFLEX MICROSCOPIC  POCT I-STAT TROPONIN I  DIFFERENTIAL          Remi Haggard, NP 04/09/12 2009

## 2012-04-09 NOTE — Discharge Instructions (Signed)
[name] the EKG and labs today show no acute process with your heart. The cardiologist looked at your findings and thought that it would be safe for you to followup with Dr. Eden Emms. Call today and make an appointment. Continue to take your lisinopril.  I will furnish a  refill for one month. You need to find a primary care provider to take care of you as well. I provided a list below for you to choose from.  Return to the ER for any chest pain or shortness of breath. Your b/p was slightly elevated in the ER today.

## 2012-04-09 NOTE — ED Notes (Signed)
Pt reports yesterday around 5pm his left arm began to feel warm and throbbing while he was watching TV. States pain subsided somewhat but returned this AM around 0800 and patient felt like he had pressure in his left chest. Pt states pain was accompanied by SOB. Rating pain 2/10 at present.

## 2012-04-09 NOTE — ED Notes (Signed)
Patient reports onset of left sided chest pain and left arm pain last night.  Patient reports he has had a little shortness of breath.

## 2012-04-10 NOTE — ED Provider Notes (Signed)
Medical screening examination/treatment/procedure(s) were conducted as a shared visit with non-physician practitioner(s) and myself.  I personally evaluated the patient during the encounter  Donnetta Hutching, MD 04/10/12 1046

## 2012-04-22 ENCOUNTER — Emergency Department (HOSPITAL_COMMUNITY)
Admission: EM | Admit: 2012-04-22 | Discharge: 2012-04-22 | Disposition: A | Discharge status: Home / Self Care | Payer: Self-pay | Attending: Emergency Medicine | Admitting: Emergency Medicine

## 2012-04-22 ENCOUNTER — Encounter (HOSPITAL_COMMUNITY): Payer: Self-pay | Admitting: *Deleted

## 2012-04-22 ENCOUNTER — Emergency Department (HOSPITAL_COMMUNITY): Payer: Self-pay

## 2012-04-22 DIAGNOSIS — I10 Essential (primary) hypertension: Secondary | ICD-10-CM | POA: Insufficient documentation

## 2012-04-22 DIAGNOSIS — R079 Chest pain, unspecified: Secondary | ICD-10-CM | POA: Insufficient documentation

## 2012-04-22 DIAGNOSIS — R0602 Shortness of breath: Secondary | ICD-10-CM | POA: Insufficient documentation

## 2012-04-22 LAB — BASIC METABOLIC PANEL
BUN: 15 mg/dL (ref 6–23)
CO2: 22 mEq/L (ref 19–32)
Calcium: 9.5 mg/dL (ref 8.4–10.5)
Chloride: 101 mEq/L (ref 96–112)
Creatinine, Ser: 1.05 mg/dL (ref 0.50–1.35)
GFR calc Af Amer: >90 mL/min (ref >90)
GFR calc non Af Amer: 80 mL/min — ABNORMAL LOW (ref >90)
Glucose, Bld: 98 mg/dL (ref 70–99)
Potassium: 4.1 mEq/L (ref 3.5–5.1)
Sodium: 137 mEq/L (ref 135–145)

## 2012-04-22 LAB — POCT I-STAT, CHEM 8
BUN: 14 mg/dL (ref 6–23)
Calcium, Ion: 1.12 mmol/L (ref 1.12–1.32)
Chloride: 105 mEq/L (ref 96–112)
Creatinine, Ser: 1.1 mg/dL (ref 0.50–1.35)
Glucose, Bld: 116 mg/dL — ABNORMAL HIGH (ref 70–99)
HCT: 44 % (ref 39.0–52.0)
Hemoglobin: 15 g/dL (ref 13.0–17.0)
Potassium: 4.1 mEq/L (ref 3.5–5.1)
Sodium: 139 mEq/L (ref 135–145)
TCO2: 21 mmol/L (ref 0–100)

## 2012-04-22 LAB — POCT I-STAT TROPONIN I
Troponin i, poc: 0 ng/mL (ref 0.00–0.08)
Troponin i, poc: 0 ng/mL (ref 0.00–0.08)

## 2012-04-22 LAB — CBC
HCT: 41.2 % (ref 39.0–52.0)
MCH: 27.9 pg (ref 26.0–34.0)
MCV: 82.1 fL (ref 78.0–100.0)
Platelets: 247 10*3/uL (ref 150–400)
RDW: 12.6 % (ref 11.5–15.5)

## 2012-04-22 MED ORDER — ASPIRIN 325 MG PO TABS
325.0000 mg | ORAL_TABLET | ORAL | Status: AC
  Administered 2012-04-22: 325 mg via ORAL
  Filled 2012-04-22: qty 1

## 2012-04-22 NOTE — ED Provider Notes (Signed)
History     CSN: [id number]  Arrival date & time 04/22/12  1247   First MD Initiated Contact with Patient 04/22/12 1448      Chief Complaint  Patient presents with  . Chest Pain    (Consider location/radiation/quality/duration/timing/severity/associated sxs/prior treatment) HPI Pt with history of HTN report she had L sided chest pressure today while walking in the yard, associated with L sided headache and mild SOB. Symptoms were not improved with NTG but resolved now. He was seen for similar symptoms about 2 months ago, admitted and had neg stress test. He was seen again 2 weeks ago and was discharge with neg markers to followup with Cardiology but scheduled for appointment in July.   Past Medical History  Diagnosis Date  . Near syncope     05/2011 with tachypalpitations & with echo showing mild LVH, normal EF 55-60%, trivial AR  . Hypertension   . Heart murmur     Longstanding. 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 Arthritis     "knees; left hand"  . Excessive drinking alcohol     Admitted to 2-3 40oz beers every other night 02/2012    Past Surgical History  Procedure Date  . No past surgeries     Family History  Problem Relation Age of Onset  . Cancer Mother     Possibly pancreatic. Died at age 51    History  Substance Use Topics  . Smoking status: Never Smoker   . Smokeless tobacco: Never Used  . Alcohol Use: 0.0 oz/week     states he has cut back      Review of Systems All other systems reviewed and are negative except as noted in HPI.    Allergies  Review of patient's allergies indicates no known allergies.  Home Medications   Current Outpatient Rx  Name Route Sig Dispense Refill  . ASPIRIN 81 MG PO CHEW Oral Chew 81 mg by mouth daily.    Marland Kitchen LISINOPRIL 10 MG PO TABS Oral Take 10 mg by mouth daily.    Marland Kitchen FISH OIL 1200 MG PO CAPS Oral Take 1,200 mg by mouth daily.    Marland Kitchen NITROGLYCERIN 0.4 MG SL SUBL Sublingual Place 1 tablet  (0.4 mg total) under the tongue every 5 (five) minutes as needed for chest pain. 30 tablet 0    BP 133/58  Pulse 53  Temp 98.6 F (37 C) (Oral)  Resp 11  Ht 5\' 7"  (1.702 m)  Wt 217 lb (98.431 kg)  BMI 33.99 kg/m2  SpO2 99%  Physical Exam  Nursing note and vitals reviewed. Constitutional: He is oriented to person, place, and time. He appears well-developed and well-nourished.  HENT:  Head: Normocephalic and atraumatic.  Eyes: EOM are normal. Pupils are equal, round, and reactive to light.  Neck: Normal range of motion. Neck supple.  Cardiovascular: Normal rate, normal heart sounds and intact distal pulses.   Pulmonary/Chest: Effort normal and breath sounds normal.  Abdominal: Bowel sounds are normal. He exhibits no distension. There is no tenderness.  Musculoskeletal: Normal range of motion. He exhibits no edema and no tenderness.  Neurological: He is alert and oriented to person, place, and time. He has normal strength. No cranial nerve deficit or sensory deficit.  Skin: Skin is warm and dry. No rash noted.  Psychiatric: He has a normal mood and affect.    ED Course  Procedures (including critical care time)  Labs Reviewed  POCT I-STAT, CHEM  8 - Abnormal; Notable for the following:    Glucose, Bld 116 (*)     All other components within normal limits  POCT I-STAT TROPONIN I  CBC  BASIC METABOLIC PANEL   Dg Chest 2 View  04/22/2012  *RADIOLOGY REPORT*  Clinical Data: Left chest pain.  CHEST - 2 VIEW  Comparison: 04/09/2012  Findings: Borderline heart size.  No confluent airspace opacities or effusions.  No acute bony abnormality.  IMPRESSION: Borderline heart size.  No active disease.  Original Report Authenticated By: Cyndie Chime, M.D.     No diagnosis found.    MDM   Date: 04/22/2012  Rate: 58  Rhythm: sinus bradycardia  QRS Axis: normal  Intervals: normal  ST/T Wave abnormalities: early repolarization  Conduction Disutrbances:none  Narrative  Interpretation:   Old EKG Reviewed: unchanged  Pt's pain has resolved. First Trop is neg. Will move to CDU, check second marker and discuss expedited outpatient followup if neg.           Nkechi Linehan B. Bernette Mayers, MD 04/22/12 [phone]

## 2012-04-22 NOTE — ED Provider Notes (Signed)
4:22 PM Assumed care of patient in the CDU. Patient presenting today with chest pain.  Patient was admitted for CP in April and had a negative stress test at that time. Today his EKG is unremarkable.  Initial troponin is negative.  Plan is for patient to have a 3 hour troponin.  If troponin is negative, patient can be discharged home with [facility] follow up. 4:50 PM Discussed with [place].  Patient can be seen in the office Tuesday June 25th.  Patient denies any CP at this time.  Aler and orientated x 3, Heart RRR, Lungs CTAB, no peripheral edema.  Discussed discharge plan with patient.  Patient in agreement with the plan.  Return precautions discussed.  Pascal Lux [place], PA-C 04/22/12 1900

## 2012-04-22 NOTE — ED Provider Notes (Signed)
Medical screening examination/treatment/procedure(s) were conducted as a shared visit with non-physician practitioner(s) and myself.  I personally evaluated the patient during the encounter   [name] B. Bernette Mayers, MD 04/22/12 1905

## 2012-04-22 NOTE — ED Notes (Signed)
Patient states "not having pain now".  Lasted for about 20 minutes with chest tightness and HA per patient.

## 2012-04-22 NOTE — Discharge Instructions (Signed)
Read instructions below for reasons to return to the Emergency Department. It is recommended that your follow up with your Primary Care Doctor in regards to today's visit. If you do not have a doctor, use the resource guide listed below to help you find one.  ° °Chest Pain (Nonspecific)  °HOME CARE INSTRUCTIONS  °For the next few days, avoid physical activities that bring on chest pain. Continue physical activities as directed.  °Do not smoke cigarettes or drink alcohol until your symptoms are gone.  °Only take over-the-counter or prescription medicine for pain, discomfort, or fever as directed by your caregiver.  °Follow your caregiver's suggestions for further testing if your chest pain does not go away.  °Keep any follow-up appointments you made. If you do not go to an appointment, you could develop lasting (chronic) problems with pain. If there is any problem keeping an appointment, you must call to reschedule.  °SEEK MEDICAL CARE IF:  °You think you are having problems from the medicine you are taking. Read your medicine instructions carefully.  °Your chest pain does not go away, even after treatment.  °You develop a rash with blisters on your chest.  °SEEK IMMEDIATE MEDICAL CARE IF:  °You have increased chest pain or pain that spreads to your arm, neck, jaw, back, or belly (abdomen).  °You develop shortness of breath, an increasing cough, or you are coughing up blood.  °You have severe back or abdominal pain, feel sick to your stomach (nauseous) or throw up (vomit).  °You develop severe weakness, fainting, or chills.  °You have an oral temperature above 102° F (38.9° C), not controlled by medicine.  ° °THIS IS AN EMERGENCY. Do not wait to see if the pain will go away. Get medical help at once. Call your local emergency services (911 in [place] Do not drive yourself to the hospital. ° ° °RESOURCE GUIDE ° °Dental Problems ° °Patients with Medicaid: °[facility]                     [facility] °[address]                                           [address] °Phone:  [phone]                                                  Phone:  510-2600 ° °If unable to pay or uninsured, contact:  [facility] or [facility] to become qualified for the adult dental clinic. ° °Chronic Pain Problems °Contact [facility]  [phone] °Patients need to be referred by their primary care doctor. ° °Insufficient Money for Medicine °Contact [employer]:  call "211" or [facility] 271-5999. ° °No Primary Care Doctor °Call [facility]  832-8000 °Other agencies that provide inexpensive medical care °   [facility]  [phone] °   [facility]  832-7272 °   [facility]  [phone] °   [facility]  832-4777 °   [facility]  [phone] °   [facility]  [phone] ° °Psychological Services °[facility]  [phone] °[facility]  [phone] °[facility]   [phone] (emergency services 641-4993) ° °Substance Abuse Resources °[facility]  336-882-2125 °[facility] 336-784-9470 °[facility] 336-285-9073 °[facility] 336-845-3988 °Residential & Outpatient Substance Abuse Program  800-659-3381 ° °Abuse/Neglect °[place] [facility] (336) 641-3795 °[place] [facility] Hotline 800-378-5315 (After Hours) ° °Emergency Shelter °[facility] (336) 271-5985 ° °Maternity Homes °[facility] (336) 275-9566 °[facility] (704) 372-4663 ° °MRSA Hotline #:   832-7006 ° ° ° °[place] Resources ° °[facility] [place] [facility]     [employer]                          [facility] °[address] [place]                       [address]      [address]  °[place]                                                [place]                             [place] °Phone:  349-3220                                   Phone:  [phone]                 Phone:  342-8140 ° °[facility] °Phone:  342-8316 ° °[place] Child Abuse Hotline °(336) 342-1394 °(336) 342-3537 (After Hours) ° ° °

## 2012-04-22 NOTE — ED Notes (Signed)
Pt states CP and dizziness, pt took BP medication and nitro with some relief but the pain came back, approxiametely 15 min. Pt pt states 4/10 CP.

## 2012-04-27 ENCOUNTER — Ambulatory Visit (INDEPENDENT_AMBULATORY_CARE_PROVIDER_SITE_OTHER): Payer: Self-pay | Admitting: Cardiovascular Disease

## 2012-04-27 ENCOUNTER — Encounter: Payer: Self-pay | Admitting: Cardiovascular Disease

## 2012-04-27 VITALS — BP 149/74 | HR 62 | Ht 67.0 in | Wt 209.0 lb

## 2012-04-27 DIAGNOSIS — F1010 Alcohol abuse, uncomplicated: Secondary | ICD-10-CM

## 2012-04-27 DIAGNOSIS — R002 Palpitations: Secondary | ICD-10-CM

## 2012-04-27 DIAGNOSIS — R55 Syncope and collapse: Secondary | ICD-10-CM

## 2012-04-27 DIAGNOSIS — R011 Cardiac murmur, unspecified: Secondary | ICD-10-CM

## 2012-04-27 NOTE — Progress Notes (Signed)
Patient ID: [name], male   DOB: 02-14-1961, 51 y.o.   MRN: [id number] 51 yo initially seen for palpitations and "syncope" 7/12  R/O  normal Tele  Echo benign . Has long standing murmur. Mild AV thickening with trivial AR. No need for SBE. Active with no recurrent lightheadedness. In hospital ECG ok with normal QT. Admitted again 02/17/12 with palpitations and ETOH abuse.  R/O and D/C for outpatient F/U. ECG with J point elevation and T wave inversions in 3,F done 4/16 and 4/17  Since D/C no palpitations.  Cut back ETOH a lot.  No syncope  ROS: Denies fever, malais, weight loss, blurry vision, decreased visual acuity, cough, sputum, SOB, hemoptysis, pleuritic pain, palpitaitons, heartburn, abdominal pain, melena, lower extremity edema, claudication, or rash.  All other systems reviewed and negative  General: Affect appropriate Healthy:  appears stated age HEENT: normal Neck supple with no adenopathy JVP normal no bruits no thyromegaly Lungs clear with no wheezing and good diaphragmatic motion Heart:  S1/S2 systolic ejection murmur, no rub, gallop or click PMI normal Abdomen: benighn, BS positve, no tenderness, no AAA no bruit.  No HSM or HJR Distal pulses intact with no bruits No edema Neuro non-focal Skin warm and dry No muscular weakness   Current Outpatient Prescriptions  Medication Sig Dispense Refill  . aspirin 81 MG chewable tablet Chew 81 mg by mouth daily.      Marland Kitchen lisinopril (PRINIVIL,ZESTRIL) 10 MG tablet Take 10 mg by mouth daily.      . nitroGLYCERIN (NITROSTAT) 0.4 MG SL tablet Place 1 tablet (0.4 mg total) under the tongue every 5 (five) minutes as needed for chest pain.  30 tablet  0  . Omega-3 Fatty Acids (FISH OIL) 1200 MG CAPS Take 1,200 mg by mouth daily.        Allergies  Review of patient's allergies indicates no known allergies.  Electrocardiogram:  Assessment and Plan

## 2012-04-27 NOTE — Assessment & Plan Note (Signed)
Improved since ETOH intake cut back.  No need for event monitor  Two hospital stays with normal telemetry

## 2012-04-27 NOTE — Assessment & Plan Note (Signed)
Incdicates marked decrease in intake since D/C Link between DCM and arrhythmia discussed

## 2012-04-27 NOTE — Assessment & Plan Note (Signed)
[facility] note from hospital indicated F/U stress testing for transient ECG changes.  Will do stress echo to assess for LVH, RWMA and AV

## 2012-04-27 NOTE — Assessment & Plan Note (Signed)
Nonrecurrent May have been related to ETOH  Stress echo F/U

## 2012-04-27 NOTE — Patient Instructions (Addendum)
Your physician has requested that you have a stress echocardiogram. For further information please visit [email or web address]. Please follow instruction sheet as given.  Your physician wants you to follow-up in: 1 year with Dr. Nishan. You will receive a reminder letter in the mail two months in advance. If you don't receive a letter, please call our office to schedule the follow-up appointment.  

## 2012-04-27 NOTE — Assessment & Plan Note (Signed)
SEM AV sclerosis  AV will be looked at during stress echo

## 2012-05-03 ENCOUNTER — Ambulatory Visit (HOSPITAL_COMMUNITY): Payer: Self-pay | Attending: Cardiology | Admitting: Radiology

## 2012-05-03 ENCOUNTER — Ambulatory Visit (HOSPITAL_COMMUNITY): Payer: Self-pay | Attending: Cardiology

## 2012-05-03 DIAGNOSIS — R072 Precordial pain: Secondary | ICD-10-CM | POA: Insufficient documentation

## 2012-05-03 DIAGNOSIS — R011 Cardiac murmur, unspecified: Secondary | ICD-10-CM | POA: Insufficient documentation

## 2012-05-03 DIAGNOSIS — I10 Essential (primary) hypertension: Secondary | ICD-10-CM | POA: Insufficient documentation

## 2012-05-03 DIAGNOSIS — R000 Tachycardia, unspecified: Secondary | ICD-10-CM | POA: Insufficient documentation

## 2012-05-03 DIAGNOSIS — I2510 Atherosclerotic heart disease of native coronary artery without angina pectoris: Secondary | ICD-10-CM | POA: Insufficient documentation

## 2012-05-03 DIAGNOSIS — R002 Palpitations: Secondary | ICD-10-CM | POA: Insufficient documentation

## 2012-05-03 DIAGNOSIS — R0989 Other specified symptoms and signs involving the circulatory and respiratory systems: Secondary | ICD-10-CM

## 2012-05-03 DIAGNOSIS — F1010 Alcohol abuse, uncomplicated: Secondary | ICD-10-CM | POA: Insufficient documentation

## 2012-05-03 DIAGNOSIS — R42 Dizziness and giddiness: Secondary | ICD-10-CM | POA: Insufficient documentation

## 2012-05-03 NOTE — Progress Notes (Signed)
Stress echocardiogram performed. 

## 2012-06-01 ENCOUNTER — Ambulatory Visit: Payer: Self-pay | Admitting: Cardiovascular Disease

## 2012-06-15 ENCOUNTER — Emergency Department (HOSPITAL_COMMUNITY): Payer: Self-pay

## 2012-06-15 ENCOUNTER — Encounter (HOSPITAL_COMMUNITY): Payer: Self-pay | Admitting: *Deleted

## 2012-06-15 ENCOUNTER — Emergency Department (HOSPITAL_COMMUNITY)
Admission: EM | Admit: 2012-06-15 | Discharge: 2012-06-16 | Disposition: A | Discharge status: Home / Self Care | Payer: Self-pay | Attending: Emergency Medicine | Admitting: Emergency Medicine

## 2012-06-15 DIAGNOSIS — I498 Other specified cardiac arrhythmias: Secondary | ICD-10-CM | POA: Insufficient documentation

## 2012-06-15 DIAGNOSIS — I10 Essential (primary) hypertension: Secondary | ICD-10-CM | POA: Insufficient documentation

## 2012-06-15 DIAGNOSIS — R0789 Other chest pain: Secondary | ICD-10-CM | POA: Insufficient documentation

## 2012-06-15 DIAGNOSIS — R079 Chest pain, unspecified: Secondary | ICD-10-CM

## 2012-06-15 LAB — POCT I-STAT, CHEM 8
Calcium, Ion: 1.28 mmol/L — ABNORMAL HIGH (ref 1.12–1.23)
Chloride: 104 mEq/L (ref 96–112)
Creatinine, Ser: 1.2 mg/dL (ref 0.50–1.35)
Glucose, Bld: 98 mg/dL (ref 70–99)
HCT: 48 % (ref 39.0–52.0)
Potassium: 4.2 mEq/L (ref 3.5–5.1)

## 2012-06-15 LAB — URINALYSIS, ROUTINE W REFLEX MICROSCOPIC
Bilirubin Urine: NEGATIVE
Glucose, UA: NEGATIVE mg/dL
Hgb urine dipstick: NEGATIVE
Protein, ur: NEGATIVE mg/dL
Urobilinogen, UA: 0.2 mg/dL (ref 0.0–1.0)

## 2012-06-15 LAB — CBC WITH DIFFERENTIAL/PLATELET
Basophils Absolute: 0.1 10*3/uL (ref 0.0–0.1)
Eosinophils Absolute: 0.2 10*3/uL (ref 0.0–0.7)
Eosinophils Relative: 4 % (ref 0–5)
HCT: 45.4 % (ref 39.0–52.0)
Lymphs Abs: 1.5 10*3/uL (ref 0.7–4.0)
MCH: 27.2 pg (ref 26.0–34.0)
MCV: 83.9 fL (ref 78.0–100.0)
Monocytes Absolute: 0.5 10*3/uL (ref 0.1–1.0)
Platelets: 214 10*3/uL (ref 150–400)
RDW: 12.9 % (ref 11.5–15.5)

## 2012-06-15 LAB — POCT I-STAT TROPONIN I: Troponin i, poc: 0 ng/mL (ref 0.00–0.08)

## 2012-06-15 MED ORDER — HYDROCODONE-ACETAMINOPHEN 5-325 MG PO TABS
1.0000 | ORAL_TABLET | Freq: Once | ORAL | Status: AC
  Administered 2012-06-15: 1 via ORAL
  Filled 2012-06-15: qty 1

## 2012-06-15 MED ORDER — NAPROXEN 375 MG PO TABS: 375.0000 mg | ORAL_TABLET | Freq: Two times a day (BID) | ORAL | Status: DC

## 2012-06-15 MED ORDER — ESOMEPRAZOLE MAGNESIUM 40 MG PO CPDR: 40.0000 mg | DELAYED_RELEASE_CAPSULE | Freq: Every day | ORAL | Status: DC

## 2012-06-15 NOTE — ED Notes (Signed)
Pt has pain shooting up right side and going into right neck.  PT reports some sob.  Only hurts when trying to get out chair.  PT sts when he turns head can feel it in his neck

## 2012-06-15 NOTE — ED Provider Notes (Signed)
History     CSN: [id number]  Arrival date & time 06/15/12  1651   First MD Initiated Contact with Patient 06/15/12 2300      No chief complaint on file.   (Consider location/radiation/quality/duration/timing/severity/associated sxs/prior treatment) Patient is a 51 y.o. male presenting with chest pain. The history is provided by the patient.  Chest Pain The chest pain began 6 - 12 hours ago. Duration of episode(s) is 6 hours. Chest pain occurs constantly. The chest pain is unchanged. At its most intense, the pain is at 7/10. The pain is currently at 2/10. The severity of the pain is moderate. The quality of the pain is described as aching. The pain does not radiate. Chest pain is worsened by certain positions. Primary symptoms include shortness of breath. He tried aspirin for the symptoms. Risk factors include alcohol intake.  His past medical history is significant for CAD.     Past Medical History  Diagnosis Date  . Near syncope     05/2011 with tachypalpitations & with echo showing mild LVH, normal EF 55-60%, trivial AR  . Hypertension   . Heart murmur     Longstanding. 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 Arthritis     "knees; left hand"  . Excessive drinking alcohol     Admitted to 2-3 40oz beers every other night 02/2012    Past Surgical History  Procedure Date  . No past surgeries     Family History  Problem Relation Age of Onset  . Cancer Mother     Possibly pancreatic. Died at age 56    History  Substance Use Topics  . Smoking status: Never Smoker   . Smokeless tobacco: Never Used  . Alcohol Use: 0.0 oz/week     states he has cut back      Review of Systems  Respiratory: Positive for shortness of breath.   Cardiovascular: Positive for chest pain.  All other systems reviewed and are negative.    Allergies  Review of patient's allergies indicates no known allergies.  Home Medications   Current Outpatient Rx  Name Route  Sig Dispense Refill  . ASPIRIN 81 MG PO CHEW Oral Chew 81 mg by mouth daily.    Marland Kitchen LISINOPRIL 10 MG PO TABS Oral Take 10 mg by mouth daily.    Marland Kitchen FISH OIL 1200 MG PO CAPS Oral Take 1,200 mg by mouth daily.    Marland Kitchen NITROGLYCERIN 0.4 MG SL SUBL Sublingual Place 1 tablet (0.4 mg total) under the tongue every 5 (five) minutes as needed for chest pain. 30 tablet 0    BP 141/72  Pulse 52  Temp 98.2 F (36.8 C) (Oral)  Resp 12  SpO2 100%  Physical Exam  Constitutional: He is oriented to person, place, and time. He appears well-developed and well-nourished.  HENT:  Head: Normocephalic and atraumatic.  Eyes: Conjunctivae are normal. Pupils are equal, round, and reactive to light.  Neck: Normal range of motion. Neck supple.  Cardiovascular: Normal rate, regular rhythm, normal heart sounds and intact distal pulses.   Pulmonary/Chest: Effort normal and breath sounds normal.  Abdominal: Soft. Bowel sounds are normal.  Neurological: He is alert and oriented to person, place, and time.  Skin: Skin is warm and dry.  Psychiatric: He has a normal mood and affect. His behavior is normal. Judgment and thought content normal.    ED Course  Procedures (including critical care time)  Labs Reviewed  POCT I-STAT, CHEM  8 - Abnormal; Notable for the following:    Calcium, Ion 1.28 (*)     All other components within normal limits  URINALYSIS, ROUTINE W REFLEX MICROSCOPIC  CBC WITH DIFFERENTIAL  POCT I-STAT TROPONIN I   Dg Chest 2 View  06/15/2012  *RADIOLOGY REPORT*  Clinical Data: Shortness of breath, chest pain.  CHEST - 2 VIEW  Comparison: April 22, 2012.  Findings: Cardiomediastinal silhouette appears normal.  No acute pulmonary disease is noted.  Bony thorax appears intact.  IMPRESSION: No acute cardiopulmonary abnormality seen.  Original Report Authenticated By: Venita Sheffield., M.D.     No diagnosis found.   Date: 06/15/2012  Rate: 54  Rhythm: sinus bradycardia  QRS Axis: normal   Intervals: normal  ST/T Wave abnormalities: nonspecific ST changes  Conduction Disutrbances:none  Narrative Interpretation:   Old EKG Reviewed: unchanged   MDM  + chest pain,  Rt sided atypical.  Pt states feels muscular.  No leg swelling,  No pleuritic component.  Pt states thinks that he slept wrong.  Improved.  Will dc to [facility] outpt cards,  Ret new/worsening sxs        Adaijah Endres Lytle Michaels, MD 06/15/12 [phone]

## 2012-06-15 NOTE — ED Notes (Signed)
Now reports heaviness in chest

## 2012-09-17 ENCOUNTER — Emergency Department (HOSPITAL_COMMUNITY): Payer: Self-pay

## 2012-09-17 ENCOUNTER — Encounter (HOSPITAL_COMMUNITY): Payer: Self-pay | Admitting: Emergency Medicine

## 2012-09-17 ENCOUNTER — Emergency Department (HOSPITAL_COMMUNITY)
Admission: EM | Admit: 2012-09-17 | Discharge: 2012-09-18 | Disposition: A | Discharge status: Home / Self Care | Payer: Self-pay | Attending: Emergency Medicine | Admitting: Emergency Medicine

## 2012-09-17 DIAGNOSIS — Z7982 Long term (current) use of aspirin: Secondary | ICD-10-CM | POA: Insufficient documentation

## 2012-09-17 DIAGNOSIS — R002 Palpitations: Secondary | ICD-10-CM | POA: Insufficient documentation

## 2012-09-17 DIAGNOSIS — R42 Dizziness and giddiness: Secondary | ICD-10-CM | POA: Insufficient documentation

## 2012-09-17 DIAGNOSIS — I10 Essential (primary) hypertension: Secondary | ICD-10-CM | POA: Insufficient documentation

## 2012-09-17 DIAGNOSIS — F1010 Alcohol abuse, uncomplicated: Secondary | ICD-10-CM | POA: Insufficient documentation

## 2012-09-17 DIAGNOSIS — R079 Chest pain, unspecified: Secondary | ICD-10-CM | POA: Insufficient documentation

## 2012-09-17 DIAGNOSIS — Z8679 Personal history of other diseases of the circulatory system: Secondary | ICD-10-CM | POA: Insufficient documentation

## 2012-09-17 DIAGNOSIS — Z79899 Other long term (current) drug therapy: Secondary | ICD-10-CM | POA: Insufficient documentation

## 2012-09-17 DIAGNOSIS — Z8739 Personal history of other diseases of the musculoskeletal system and connective tissue: Secondary | ICD-10-CM | POA: Insufficient documentation

## 2012-09-17 LAB — CBC
HCT: 46.4 % (ref 39.0–52.0)
Hemoglobin: 15.9 g/dL (ref 13.0–17.0)
MCH: 27.8 pg (ref 26.0–34.0)
MCHC: 34.3 g/dL (ref 30.0–36.0)
RDW: 14 % (ref 11.5–15.5)

## 2012-09-17 LAB — BASIC METABOLIC PANEL
BUN: 19 mg/dL (ref 6–23)
Calcium: 9.4 mg/dL (ref 8.4–10.5)
Creatinine, Ser: 1.14 mg/dL (ref 0.50–1.35)
GFR calc Af Amer: 84 mL/min — ABNORMAL LOW (ref >90)
GFR calc non Af Amer: 72 mL/min — ABNORMAL LOW (ref >90)
Glucose, Bld: 191 mg/dL — ABNORMAL HIGH (ref 70–99)
Potassium: 3.9 mEq/L (ref 3.5–5.1)

## 2012-09-17 NOTE — ED Provider Notes (Signed)
History     CSN: [id number]  Arrival date & time 09/17/12  1900   First MD Initiated Contact with Patient 09/17/12 2144      Chief Complaint  Patient presents with  . Shortness of Breath    (Consider location/radiation/quality/duration/timing/severity/associated sxs/prior treatment) HPI [name] is a 51 y.o. male who presents with complaint of chest pain, dizziness, near syncope. Stats symptoms occurred while at rest. States lasted several minutes, resolved on its own. During that time, felt like heart was racing. States hx of the same in April, was hospitalized, ad abnormal ECG. Seen Dr. Eden Emms for follow up, was told everything was OK. Pt denies nausea, dizziness, diaphoresis during the episode. Pt is symptom free at present.   Past Medical History  Diagnosis Date  . Near syncope     05/2011 with tachypalpitations & with echo showing mild LVH, normal EF 55-60%, trivial AR  . Hypertension   . Heart murmur     Longstanding. 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 Arthritis     "knees; left hand"  . Excessive drinking alcohol     Admitted to 2-3 40oz beers every other night 02/2012    Past Surgical History  Procedure Date  . No past surgeries     Family History  Problem Relation Age of Onset  . Cancer Mother     Possibly pancreatic. Died at age 55    History  Substance Use Topics  . Smoking status: Never Smoker   . Smokeless tobacco: Never Used  . Alcohol Use: 0.0 oz/week     Comment: states he has cut back/ every now and again      Review of Systems  Respiratory: Positive for chest tightness. Negative for cough and shortness of breath.   Cardiovascular: Positive for chest pain and palpitations. Negative for leg swelling.  Neurological: Positive for dizziness and light-headedness.  All other systems reviewed and are negative.    Allergies  Review of patient's allergies indicates no known allergies.  Home Medications   Current  Outpatient Rx  Name  Route  Sig  Dispense  Refill  . ASPIRIN 81 MG PO CHEW   Oral   Chew 81 mg by mouth daily.         Marland Kitchen ESOMEPRAZOLE MAGNESIUM 40 MG PO CPDR   Oral   Take 1 capsule (40 mg total) by mouth daily.   30 capsule   0   . LISINOPRIL 10 MG PO TABS   Oral   Take 10 mg by mouth daily.         Marland Kitchen NAPROXEN 375 MG PO TABS   Oral   Take 1 tablet (375 mg total) by mouth 2 (two) times daily.   20 tablet   0   . NITROGLYCERIN 0.4 MG SL SUBL   Sublingual   Place 1 tablet (0.4 mg total) under the tongue every 5 (five) minutes as needed for chest pain.   30 tablet   0   . FISH OIL 1200 MG PO CAPS   Oral   Take 1,200 mg by mouth daily.           BP 136/68  Pulse 66  Temp 97.8 F (36.6 C) (Oral)  Resp 15  SpO2 98%  Physical Exam  Nursing note and vitals reviewed. Constitutional: He is oriented to person, place, and time. He appears well-developed and well-nourished. No distress.  Eyes: Conjunctivae normal are normal.  Neck: Neck supple.  Cardiovascular: Normal rate and regular rhythm.   Murmur heard. Pulmonary/Chest: Effort normal and breath sounds normal. No respiratory distress. He has no wheezes. He has no rales.  Abdominal: Soft. Bowel sounds are normal. He exhibits no distension. There is no tenderness. There is no rebound.  Musculoskeletal: He exhibits no edema.       No calf swelling or tenderness  Neurological: He is alert and oriented to person, place, and time.  Skin: Skin is warm and dry.    ED Course  Procedures (including critical care time)  Pt seen and examined. Currently symptom free. Pt had negative nuclear stress test in 4/13 and negative stress echo on 6/13. Will get labs, troponin, ecg.   Results for orders placed during the hospital encounter of 09/17/12  CBC      Component Value Range   WBC 6.0  4.0 - 10.5 K/uL   RBC 5.71  4.22 - 5.81 MIL/uL   Hemoglobin 15.9  13.0 - 17.0 g/dL   HCT 40.9  81.1 - 91.4 %   MCV 81.3  78.0 - 100.0  fL   MCH 27.8  26.0 - 34.0 pg   MCHC 34.3  30.0 - 36.0 g/dL   RDW 78.2  95.6 - 21.3 %   Platelets 235  150 - 400 K/uL  BASIC METABOLIC PANEL      Component Value Range   Sodium 135  135 - 145 mEq/L   Potassium 3.9  3.5 - 5.1 mEq/L   Chloride 101  96 - 112 mEq/L   CO2 20  19 - 32 mEq/L   Glucose, Bld 191 (*) 70 - 99 mg/dL   BUN 19  6 - 23 mg/dL   Creatinine, Ser 0.86  0.50 - 1.35 mg/dL   Calcium 9.4  8.4 - 57.8 mg/dL   GFR calc non Af Amer 72 (*) >90 mL/min   GFR calc Af Amer 84 (*) >90 mL/min  POCT I-STAT TROPONIN I      Component Value Range   Troponin i, poc 0.00  0.00 - 0.08 ng/mL   Comment 3           POCT I-STAT TROPONIN I      Component Value Range   Troponin i, poc 0.01  0.00 - 0.08 ng/mL   Comment 3            Dg Chest 2 View  09/17/2012  *RADIOLOGY REPORT*  Clinical Data: Shortness of breath  CHEST - 2 VIEW  Comparison: 06/15/2012  Findings: Lungs are essentially clear. No pleural effusion or pneumothorax.  Cardiomediastinal silhouette is within normal limits.  Visualized osseous structures are within normal limits.  IMPRESSION: No evidence of acute cardiopulmonary disease.   Original Report Authenticated By: Charline Bills, M.D.      Date: 09/17/2012  Rate: 80  Rhythm: normal sinus rhythm  QRS Axis: normal  Intervals: normal  ST/T Wave abnormalities: nonspecific ST changes  Conduction Disutrbances:none  Narrative Interpretation:   Old EKG Reviewed: unchanged  11:55 PM Spoke with [place]. Given two recent negative stress test studies, pt symptom free, OK to d/c home with close follow up, return if worsening.   1. Chest pain   2. Palpitations       MDM  Pt with CP, dizziness, palpitations that lasted several minutes, resolved on its own. Pt with negative nuclear stress test and stress echo within last 6 months, both negative. Pt is followed by Dr. Eden Emms. negatve CXR, labs, two sets of troponin.  Discussed with [facility], will d/c home with close follow  up next week.   Filed Vitals:   09/17/12 2149  BP: 136/68  Pulse: 66  Temp:   Resp: 15           Sheena Simonis A Kourtlynn Trevor, PA 09/18/12 0113

## 2012-09-17 NOTE — ED Notes (Signed)
Pt. Complains of SOB and dizziness with some racing of the heart.  This episode occurred approximately an hour ago while walking in the cold; the event, according to the pt., Lasted For 5 minutes then went away.  Denies N/V/D.  Denies pain at this time.

## 2012-09-18 NOTE — ED Provider Notes (Signed)
Medical screening examination/treatment/procedure(s) were performed by non-physician practitioner and as supervising physician I was immediately available for consultation/collaboration.   Glynn Octave, MD 09/18/12 1143

## 2012-09-18 NOTE — ED Notes (Signed)
Pt dc to home.  Pt ambulatory to exit without difficulty.  Pt verbalizes understanding to dc instructions.

## 2013-02-17 ENCOUNTER — Emergency Department (HOSPITAL_COMMUNITY)
Admission: EM | Admit: 2013-02-17 | Discharge: 2013-02-17 | Disposition: A | Discharge status: Home / Self Care | Payer: Self-pay | Attending: Emergency Medicine | Admitting: Emergency Medicine

## 2013-02-17 ENCOUNTER — Encounter (HOSPITAL_COMMUNITY): Payer: Self-pay | Admitting: Emergency Medicine

## 2013-02-17 DIAGNOSIS — Z8679 Personal history of other diseases of the circulatory system: Secondary | ICD-10-CM | POA: Insufficient documentation

## 2013-02-17 DIAGNOSIS — M7989 Other specified soft tissue disorders: Secondary | ICD-10-CM | POA: Insufficient documentation

## 2013-02-17 DIAGNOSIS — M25579 Pain in unspecified ankle and joints of unspecified foot: Secondary | ICD-10-CM | POA: Insufficient documentation

## 2013-02-17 DIAGNOSIS — I10 Essential (primary) hypertension: Secondary | ICD-10-CM | POA: Insufficient documentation

## 2013-02-17 DIAGNOSIS — F1010 Alcohol abuse, uncomplicated: Secondary | ICD-10-CM | POA: Insufficient documentation

## 2013-02-17 DIAGNOSIS — M25572 Pain in left ankle and joints of left foot: Secondary | ICD-10-CM

## 2013-02-17 DIAGNOSIS — Z7982 Long term (current) use of aspirin: Secondary | ICD-10-CM | POA: Insufficient documentation

## 2013-02-17 DIAGNOSIS — Z8739 Personal history of other diseases of the musculoskeletal system and connective tissue: Secondary | ICD-10-CM | POA: Insufficient documentation

## 2013-02-17 MED ORDER — HYDROCODONE-ACETAMINOPHEN 5-325 MG PO TABS: 1.0000 | ORAL_TABLET | ORAL | Status: DC | PRN

## 2013-02-17 MED ORDER — ENOXAPARIN SODIUM 100 MG/ML ~~LOC~~ SOLN
90.0000 mg | Freq: Once | SUBCUTANEOUS | Status: AC
  Administered 2013-02-17: 90 mg via SUBCUTANEOUS
  Filled 2013-02-17: qty 1

## 2013-02-17 MED ORDER — OXYCODONE-ACETAMINOPHEN 5-325 MG PO TABS
1.0000 | ORAL_TABLET | Freq: Once | ORAL | Status: AC
  Administered 2013-02-17: 1 via ORAL
  Filled 2013-02-17: qty 1

## 2013-02-17 NOTE — ED Provider Notes (Signed)
S: [name]'s care was assumed from Dr. Preston Fleeting with an unclear disposition plan.  Concerns for DVT with pt c/o 4 days of unilateral leftfoot and leg swelling.  Patient denies injury and has no history of blood clots. No recent travel time no fever, shortness of breath or chest pain. Patient takes aspirin daily at home.  O: left lower leg length mild nonpitting edema from mid calf to mid forefoot without obvious lesion and mild erythema and warmth. Patient alert and oriented, NAD.  No tachypnea, no hypoxia.  Breath sounds clear and equal, regular rate and rhythm.  A/P: concern for DVT vs Achilles tendinitis.  Patient was given a Lovenox shot here in the emergency department.  Lower extremity venous Doppler ordered but, after 7 PM. Discussed with patient and wife and the patient agrees he would rather go home and return in the morning for this study. He has crutches at home which he will use. Plan is for patient to receive the Doppler in the morning and create a further treatment plan based on Doppler results. I have discussed reasons for immediate return to the emergency room and patient states understanding.     Dahlia Client Kerriann Kamphuis, PA-C 02/17/13 2215

## 2013-02-17 NOTE — ED Notes (Signed)
Pt c/o left lower leg pain x 4 days with some swelling that has gotten better; pt denies obvious injury

## 2013-02-17 NOTE — ED Provider Notes (Signed)
History     CSN: [id number]  Arrival date & time 02/17/13  1608   First MD Initiated Contact with Patient 02/17/13 1857      Chief Complaint  Patient presents with  . Leg Pain    (Consider location/radiation/quality/duration/timing/severity/associated sxs/prior treatment) Patient is a 52 y.o. male presenting with leg pain. The history is provided by the patient.  Leg Pain Location:  Leg Injury: no   Leg location:  L lower leg Pain details:    Quality:  Aching   Radiates to:  Does not radiate   Severity:  Moderate   Duration:  5 days Chronicity:  New Relieved by:  Nothing Worsened by:  Activity, extension and bearing weight Associated symptoms: no fever   Associated symptoms comment:  Pain and swelling to left lower leg for the past 4-5 days without known injury. No history of blood clots. No recent travel. He has no history of fever, no SOB and no chest pain.   Past Medical History  Diagnosis Date  . Near syncope     05/2011 with tachypalpitations & with echo showing mild LVH, normal EF 55-60%, trivial AR  . Hypertension   . Heart murmur     Longstanding. 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 Arthritis     "knees; left hand"  . Excessive drinking alcohol     Admitted to 2-3 40oz beers every other night 02/2012    Past Surgical History  Procedure Laterality Date  . No past surgeries      Family History  Problem Relation Age of Onset  . Cancer Mother     Possibly pancreatic. Died at age 76    History  Substance Use Topics  . Smoking status: Never Smoker   . Smokeless tobacco: Never Used  . Alcohol Use: 0.0 oz/week     Comment: states he has cut back/ every now and again      Review of Systems  Constitutional: Negative for fever.  Respiratory: Negative for shortness of breath.   Cardiovascular: Negative for chest pain.  Gastrointestinal: Negative for nausea, vomiting and abdominal pain.  Musculoskeletal:       See HPI.     Allergies  Review of patient's allergies indicates no known allergies.  Home Medications   Current Outpatient Rx  Name  Route  Sig  Dispense  Refill  . aspirin 81 MG chewable tablet   Oral   Chew 81 mg by mouth daily.         . nitroGLYCERIN (NITROSTAT) 0.4 MG SL tablet   Sublingual   Place 0.4 mg under the tongue every 5 (five) minutes as needed for chest pain.           BP 153/78  Pulse 74  Temp(Src) 99.1 F (37.3 C) (Oral)  Resp 18  SpO2 100%  Physical Exam  Neck: Normal range of motion.  Pulmonary/Chest: Effort normal. He exhibits no tenderness.  Abdominal: There is no tenderness. There is no rebound.  Musculoskeletal:  Left lower leg mildly swollen from mid-shaft to forefoot. Slightly warm with minimal redness. No lesion, no drainage.    ED Course  Procedures (including critical care time)  Labs Reviewed - No data to display No results found.   No diagnosis found. 1. Left lower leg swelling   MDM  DDx: Achilles tendon strain vs left LE DVT. Patient will be given Lovenox and scheduled for doppler study in the morning. Pain medications given.  Arnoldo Hooker, PA-C 02/18/13 2128

## 2013-02-17 NOTE — ED Notes (Signed)
Patient to return in the morning for a venous doppler

## 2013-02-18 ENCOUNTER — Ambulatory Visit (HOSPITAL_COMMUNITY)
Admission: RE | Admit: 2013-02-18 | Discharge: 2013-02-18 | Disposition: A | Discharge status: Home / Self Care | Payer: Self-pay | Source: Ambulatory Visit | Attending: Emergency Medicine | Admitting: Emergency Medicine

## 2013-02-18 DIAGNOSIS — M7989 Other specified soft tissue disorders: Secondary | ICD-10-CM | POA: Insufficient documentation

## 2013-02-18 DIAGNOSIS — M79609 Pain in unspecified limb: Secondary | ICD-10-CM | POA: Insufficient documentation

## 2013-02-18 DIAGNOSIS — F4542 Pain disorder with related psychological factors: Secondary | ICD-10-CM | POA: Insufficient documentation

## 2013-02-18 NOTE — ED Provider Notes (Signed)
Medical screening examination/treatment/procedure(s) were performed by non-physician practitioner and as supervising physician I was immediately available for consultation/collaboration.   Joya Gaskins, MD 02/18/13 [phone]

## 2013-02-18 NOTE — Progress Notes (Signed)
Bilateral lower extremity venous duplex:  No evidence of DVT, superficial thrombosis, or Baker's Cyst.   

## 2013-02-19 NOTE — ED Provider Notes (Signed)
Medical screening examination/treatment/procedure(s) were performed by non-physician practitioner and as supervising physician I was immediately available for consultation/collaboration.   Dione Booze, MD 02/19/13 [phone]

## 2013-08-16 ENCOUNTER — Emergency Department (HOSPITAL_COMMUNITY)
Admission: EM | Admit: 2013-08-16 | Discharge: 2013-08-16 | Disposition: A | Discharge status: Home / Self Care | Payer: Self-pay | Attending: Emergency Medicine | Admitting: Emergency Medicine

## 2013-08-16 ENCOUNTER — Encounter (HOSPITAL_COMMUNITY): Payer: Self-pay | Admitting: Emergency Medicine

## 2013-08-16 DIAGNOSIS — M129 Arthropathy, unspecified: Secondary | ICD-10-CM | POA: Insufficient documentation

## 2013-08-16 DIAGNOSIS — M7020 Olecranon bursitis, unspecified elbow: Secondary | ICD-10-CM | POA: Insufficient documentation

## 2013-08-16 DIAGNOSIS — I10 Essential (primary) hypertension: Secondary | ICD-10-CM | POA: Insufficient documentation

## 2013-08-16 DIAGNOSIS — Z7982 Long term (current) use of aspirin: Secondary | ICD-10-CM | POA: Insufficient documentation

## 2013-08-16 DIAGNOSIS — M7022 Olecranon bursitis, left elbow: Secondary | ICD-10-CM

## 2013-08-16 MED ORDER — NAPROXEN 500 MG PO TABS: 500.0000 mg | ORAL_TABLET | Freq: Two times a day (BID) | ORAL | Status: DC

## 2013-08-16 MED ORDER — PREDNISONE 20 MG PO TABS: ORAL_TABLET | ORAL | Status: DC

## 2013-08-16 MED ORDER — NAPROXEN 125 MG/5ML PO SUSP: 500.0000 mg | Freq: Two times a day (BID) | ORAL | Status: DC

## 2013-08-16 MED ORDER — PREDNISONE 20 MG PO TABS
60.0000 mg | ORAL_TABLET | Freq: Once | ORAL | Status: AC
  Administered 2013-08-16: 60 mg via ORAL
  Filled 2013-08-16: qty 3

## 2013-08-16 MED ORDER — HYDROCODONE-ACETAMINOPHEN 5-325 MG PO TABS: 2.0000 | ORAL_TABLET | ORAL | Status: DC | PRN

## 2013-08-16 MED ORDER — NAPROXEN 500 MG PO TABS
500.0000 mg | ORAL_TABLET | Freq: Once | ORAL | Status: AC
  Administered 2013-08-16: 500 mg via ORAL
  Filled 2013-08-16: qty 1

## 2013-08-16 NOTE — ED Provider Notes (Addendum)
CSN: [id number]     Arrival date & time 08/16/13  0732 History   First MD Initiated Contact with Patient 08/16/13 [phone]     Chief Complaint  Patient presents with  . Arm Pain   (Consider location/radiation/quality/duration/timing/severity/associated sxs/prior Treatment) HPI..... Sharp left elbow tip pain for 3 days. No injury. No fever, sweats, chills.  severity is mild to moderate. No other complaints.  No soft tissue tenderness upper arm or forearm  Past Medical History  Diagnosis Date  . Near syncope     05/2011 with tachypalpitations & with echo showing mild LVH, normal EF 55-60%, trivial AR  . Hypertension   . Heart murmur     Longstanding. 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 Arthritis     "knees; left hand"  . Excessive drinking alcohol     Admitted to 2-3 40oz beers every other night 02/2012   Past Surgical History  Procedure Laterality Date  . No past surgeries     Family History  Problem Relation Age of Onset  . Cancer Mother     Possibly pancreatic. Died at age 46   History  Substance Use Topics  . Smoking status: Never Smoker   . Smokeless tobacco: Never Used  . Alcohol Use: 0.0 oz/week     Comment: states he has cut back/ every now and again    Review of Systems  All other systems reviewed and are negative.    Allergies  Review of patient's allergies indicates no known allergies.  Home Medications   Current Outpatient Rx  Name  Route  Sig  Dispense  Refill  . aspirin 81 MG chewable tablet   Oral   Chew 81 mg by mouth daily.         . Aspirin-Acetaminophen-Caffeine (EXCEDRIN PO)   Oral   Take 1 tablet by mouth daily as needed (pain).         Marland Kitchen HYDROcodone-acetaminophen (NORCO) 5-325 MG per tablet   Oral   Take 2 tablets by mouth every 4 (four) hours as needed for pain.   20 tablet   0   . naproxen (NAPROSYN) 500 MG tablet   Oral   Take 1 tablet (500 mg total) by mouth 2 (two) times daily.   20 tablet   0    . predniSONE (DELTASONE) 20 MG tablet      3 tabs po day one, then 2 po daily x 4 days   11 tablet   0    BP 137/73  Pulse 66  Temp(Src) 97.9 F (36.6 C) (Oral)  Resp 16  SpO2 100% Physical Exam  Nursing note and vitals reviewed. Constitutional: He is oriented to person, place, and time. He appears well-developed and well-nourished.  HENT:  Head: Normocephalic and atraumatic.  Musculoskeletal:   Tender at tip of left elbow with slight warmth.  Pain with range of motion at elbow.  Neurological: He is alert and oriented to person, place, and time.  Skin: Skin is warm and dry.  Psychiatric: He has a normal mood and affect.    ED Course  Procedures (including critical care time) Labs Review Labs Reviewed - No data to display Imaging Review No results found.  EKG Interpretation   None       MDM   1. Olecranon bursitis of left elbow    History and physical consistent with left olecranon bursitis.   Rx prednisone, Naprosyn 500 mg, Norco    Donnetta Hutching,  MD 08/16/13 6295  Donnetta Hutching, MD 08/16/13 [phone]

## 2013-08-16 NOTE — ED Notes (Signed)
Pt c/o pain/swelling/redness to lt elbow x3days

## 2014-05-16 ENCOUNTER — Emergency Department (HOSPITAL_COMMUNITY)
Admission: EM | Admit: 2014-05-16 | Discharge: 2014-05-16 | Disposition: A | Discharge status: Home / Self Care | Payer: Self-pay | Attending: Emergency Medicine | Admitting: Emergency Medicine

## 2014-05-16 ENCOUNTER — Encounter (HOSPITAL_COMMUNITY): Payer: Self-pay | Admitting: Emergency Medicine

## 2014-05-16 DIAGNOSIS — I10 Essential (primary) hypertension: Secondary | ICD-10-CM | POA: Insufficient documentation

## 2014-05-16 DIAGNOSIS — M129 Arthropathy, unspecified: Secondary | ICD-10-CM | POA: Insufficient documentation

## 2014-05-16 DIAGNOSIS — R55 Syncope and collapse: Secondary | ICD-10-CM | POA: Insufficient documentation

## 2014-05-16 DIAGNOSIS — R000 Tachycardia, unspecified: Secondary | ICD-10-CM | POA: Insufficient documentation

## 2014-05-16 DIAGNOSIS — R011 Cardiac murmur, unspecified: Secondary | ICD-10-CM | POA: Insufficient documentation

## 2014-05-16 LAB — BASIC METABOLIC PANEL
ANION GAP: 16 — AB (ref 5–15)
BUN: 19 mg/dL (ref 6–23)
CO2: 24 mEq/L (ref 19–32)
Calcium: 9.7 mg/dL (ref 8.4–10.5)
Chloride: 100 mEq/L (ref 96–112)
Creatinine, Ser: 1.26 mg/dL (ref 0.50–1.35)
GFR, EST AFRICAN AMERICAN: 73 mL/min — AB (ref >90)
GFR, EST NON AFRICAN AMERICAN: 63 mL/min — AB (ref >90)
Glucose, Bld: 113 mg/dL — ABNORMAL HIGH (ref 70–99)
POTASSIUM: 4.1 meq/L (ref 3.7–5.3)
SODIUM: 140 meq/L (ref 137–147)

## 2014-05-16 LAB — CBC
HCT: 48.2 % (ref 39.0–52.0)
HEMOGLOBIN: 15.7 g/dL (ref 13.0–17.0)
MCH: 27.9 pg (ref 26.0–34.0)
MCHC: 32.6 g/dL (ref 30.0–36.0)
MCV: 85.6 fL (ref 78.0–100.0)
PLATELETS: 211 10*3/uL (ref 150–400)
RBC: 5.63 MIL/uL (ref 4.22–5.81)
RDW: 13.3 % (ref 11.5–15.5)
WBC: 5.8 10*3/uL (ref 4.0–10.5)

## 2014-05-16 LAB — I-STAT TROPONIN, ED: TROPONIN I, POC: 0 ng/mL (ref 0.00–0.08)

## 2014-05-16 MED ORDER — SODIUM CHLORIDE 0.9 % IV BOLUS (SEPSIS)
1000.0000 mL | Freq: Once | INTRAVENOUS | Status: AC
  Administered 2014-05-16: 1000 mL via INTRAVENOUS

## 2014-05-16 MED ORDER — NITROGLYCERIN 0.4 MG SL SUBL
0.4000 mg | SUBLINGUAL_TABLET | SUBLINGUAL | Status: DC | PRN
  Administered 2014-05-16: 0.4 mg via SUBLINGUAL

## 2014-05-16 NOTE — Discharge Instructions (Signed)
Near-Syncope Near-syncope (commonly known as near fainting) is sudden weakness, dizziness, or feeling like you might pass out. During an episode of near-syncope, you may also develop pale skin, have tunnel vision, or feel sick to your stomach (nauseous). Near-syncope may occur when getting up after sitting or while standing for a long time. It is caused by a sudden decrease in blood flow to the brain. This decrease can result from various causes or triggers, most of which are not serious. However, because near-syncope can sometimes be a sign of something serious, a medical evaluation is required. The specific cause is often not determined. HOME CARE INSTRUCTIONS  Monitor your condition for any changes. The following actions may help to alleviate any discomfort you are experiencing:  Have someone stay with you until you feel stable.  Lie down right away and prop your feet up if you start feeling like you might faint. Breathe deeply and steadily. Wait until all the symptoms have passed. Most of these episodes last only a few minutes. You may feel tired for several hours.   Drink enough fluids to keep your urine clear or pale yellow.   If you are taking blood pressure or heart medicine, get up slowly when seated or lying down. Take several minutes to sit and then stand. This can reduce dizziness.  Follow up with your health care provider as directed. SEEK IMMEDIATE MEDICAL CARE IF:   You have a severe headache.   You have unusual pain in the chest, abdomen, or back.   You are bleeding from the mouth or rectum, or you have black or tarry stool.   You have an irregular or very fast heartbeat.   You have repeated fainting or have seizure-like jerking during an episode.   You faint when sitting or lying down.   You have confusion.   You have difficulty walking.   You have severe weakness.   You have vision problems.  MAKE SURE YOU:   Understand these instructions.  Will  watch your condition.  Will get help right away if you are not doing well or get worse. Document Released: 10/20/2005 Document Revised: 10/25/2013 Document Reviewed: 03/25/2013 [employer] Patient Information 2015 [employer]. This information is not intended to replace advice given to you by your health care provider. Make sure you discuss any questions you have with your health care provider.  

## 2014-05-16 NOTE — ED Notes (Signed)
He states " i was just walking along and my heart started racing and i started to feel really lightheaded." he states symptoms have improved some but "i still feel a little funny in my chest.'

## 2014-05-18 NOTE — ED Provider Notes (Signed)
CSN: [id number]     Arrival date & time 05/16/14  1406 History   First MD Initiated Contact with Patient 05/16/14 1417     Chief Complaint  Patient presents with  . Tachycardia  . Dizziness     (Consider location/radiation/quality/duration/timing/severity/associated sxs/prior Treatment) Patient is a 53 y.o. male presenting with near-syncope.  Near Syncope This is a new problem. The current episode started today. The problem occurs rarely. The problem has been resolved. Pertinent negatives include no abdominal pain, chest pain, congestion, coughing or headaches. The symptoms are aggravated by walking and exertion. He has tried rest for the symptoms.    Past Medical History  Diagnosis Date  . Near syncope     05/2011 with tachypalpitations & with echo showing mild LVH, normal EF 55-60%, trivial AR  . Hypertension   . Heart murmur     Longstanding. 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 Arthritis     "knees; left hand"  . Excessive drinking alcohol     Admitted to 2-3 40oz beers every other night 02/2012   Past Surgical History  Procedure Laterality Date  . No past surgeries     Family History  Problem Relation Age of Onset  . Cancer Mother     Possibly pancreatic. Died at age 3   History  Substance Use Topics  . Smoking status: Never Smoker   . Smokeless tobacco: Never Used  . Alcohol Use: 0.0 oz/week     Comment: states he has cut back/ every now and again    Review of Systems  Constitutional: Negative for activity change.  HENT: Negative for congestion.   Eyes: Negative for visual disturbance.  Respiratory: Negative for cough and shortness of breath.   Cardiovascular: Positive for palpitations and near-syncope. Negative for chest pain and leg swelling.  Gastrointestinal: Negative for abdominal pain and blood in stool.  Genitourinary: Negative for dysuria and hematuria.  Musculoskeletal: Negative for back pain.  Skin: Negative for color  change.  Neurological: Positive for light-headedness. Negative for syncope and headaches.  Psychiatric/Behavioral: Negative for agitation.      Allergies  Review of patient's allergies indicates no known allergies.  Home Medications   Prior to Admission medications   Medication Sig Start Date End Date Taking? Authorizing Provider  Aspirin-Caffeine (BAYER BACK & BODY PAIN EX ST) 500-32.5 MG TABS Take 1 tablet by mouth every 8 (eight) hours as needed (for pain).   Yes Historical Provider, MD   BP 141/70  Pulse 52  Temp(Src) 98.1 F (36.7 C) (Oral)  Resp 17  Ht 5\' 7"  (1.702 m)  Wt 200 lb (90.719 kg)  BMI 31.32 kg/m2  SpO2 100% Physical Exam  Nursing note and vitals reviewed. Constitutional: He is oriented to person, place, and time. He appears well-developed and well-nourished.  HENT:  Head: Normocephalic.  Eyes: Pupils are equal, round, and reactive to light.  Neck: Neck supple.  Cardiovascular: Normal rate and regular rhythm.  Exam reveals no gallop and no friction rub.   No murmur heard. Pulmonary/Chest: Effort normal. No respiratory distress.  Abdominal: Soft. He exhibits no distension. There is no tenderness.  Musculoskeletal: He exhibits no edema.  Neurological: He is alert and oriented to person, place, and time.  Skin: Skin is warm.  Psychiatric: He has a normal mood and affect.   Cranial nerves III-XII grossly intact Strength 5+/5+ to upper and lower extremities bilaterally with resistance applied, equal distribution noted Strength intact to MCP, PIP,  DIP joints of  hand Negative arm drift Fine motor skills intact Heel to knee down shin normal bilaterally Gait proper, proper balance - negative sway, negative drift, negative step-offs   ED Course  Procedures (including critical care time) Labs Review Labs Reviewed  BASIC METABOLIC PANEL - Abnormal; Notable for the following:    Glucose, Bld 113 (*)    GFR calc non Af Amer 63 (*)    GFR calc Af Amer 73 (*)     Anion gap 16 (*)    All other components within normal limits  CBC  I-STAT TROPOININ, ED    Imaging Review No results found.   EKG Interpretation   Date/Time:  Tuesday May 16 2014 14:09:51 EDT Ventricular Rate:  66 PR Interval:  154 QRS Duration: 82 QT Interval:  394 QTC Calculation: 413 R Axis:   28 Text Interpretation:  Normal sinus rhythm No significant change since 2013  Confirmed by GOLDSTON  MD, Jay ([id number]) on 05/16/2014 2:12:28 PM      MDM   Final diagnoses:  Pre-syncope   53 y/o male with PMH pre-hypertension on no medications had pre-syncopal episode after walking around [employer], no syncope, improved with rest and patient feels that symptoms have resolved at this point. Patient evaluated with orthostatic BPs which were wnl. Trop- neg CBC and BMP non-contributory, glucose- 113.  EKG- normal sinus with no changes since 2013 and no significant abnormality. Patient given fluids in the ED with even greater improvement.  Determined appropriate for discharge with return precautions given and instructions to follow-up with PCP if symptoms return.    Claudean Severance, MD 05/18/14 2001

## 2014-05-24 NOTE — ED Provider Notes (Signed)
I saw and evaluated the patient, reviewed the resident's note and I agree with the findings and plan.   EKG Interpretation   Date/Time:  Tuesday May 16 2014 14:09:51 EDT Ventricular Rate:  66 PR Interval:  154 QRS Duration: 82 QT Interval:  394 QTC Calculation: 413 R Axis:   28 Text Interpretation:  Normal sinus rhythm No significant change since 2013  Confirmed by GOLDSTON  MD, SCOTT ([id number]) on 05/16/2014 2:12:28 PM      Pt comes in with near syncope. Pt has no significant cardiac hx, and the EKG is normal. SF syncope score is neg as well. Pt assessed over tele, and had no concerning findings. Pt's cardiopulm exam were normal as well. Stable for d.c  Varney Biles, MD 05/24/14 [id number]

## 2016-03-21 ENCOUNTER — Encounter (HOSPITAL_COMMUNITY): Payer: Self-pay | Admitting: Emergency Medicine

## 2016-03-21 ENCOUNTER — Emergency Department (HOSPITAL_COMMUNITY)
Admission: EM | Admit: 2016-03-21 | Discharge: 2016-03-21 | Disposition: A | Discharge status: Home / Self Care | Payer: Self-pay | Attending: Emergency Medicine | Admitting: Emergency Medicine

## 2016-03-21 DIAGNOSIS — M19042 Primary osteoarthritis, left hand: Secondary | ICD-10-CM | POA: Insufficient documentation

## 2016-03-21 DIAGNOSIS — Z7982 Long term (current) use of aspirin: Secondary | ICD-10-CM | POA: Insufficient documentation

## 2016-03-21 DIAGNOSIS — Y999 Unspecified external cause status: Secondary | ICD-10-CM | POA: Insufficient documentation

## 2016-03-21 DIAGNOSIS — M170 Bilateral primary osteoarthritis of knee: Secondary | ICD-10-CM | POA: Insufficient documentation

## 2016-03-21 DIAGNOSIS — Y929 Unspecified place or not applicable: Secondary | ICD-10-CM | POA: Insufficient documentation

## 2016-03-21 DIAGNOSIS — M10042 Idiopathic gout, left hand: Secondary | ICD-10-CM | POA: Insufficient documentation

## 2016-03-21 DIAGNOSIS — X509XXA Other and unspecified overexertion or strenuous movements or postures, initial encounter: Secondary | ICD-10-CM | POA: Insufficient documentation

## 2016-03-21 DIAGNOSIS — M109 Gout, unspecified: Secondary | ICD-10-CM

## 2016-03-21 DIAGNOSIS — I10 Essential (primary) hypertension: Secondary | ICD-10-CM | POA: Insufficient documentation

## 2016-03-21 DIAGNOSIS — Y939 Activity, unspecified: Secondary | ICD-10-CM | POA: Insufficient documentation

## 2016-03-21 MED ORDER — NAPROXEN 500 MG PO TABS: 500.0000 mg | ORAL_TABLET | Freq: Two times a day (BID) | ORAL | Status: DC | PRN

## 2016-03-21 MED ORDER — PREDNISONE 20 MG PO TABS: ORAL_TABLET | ORAL | Status: DC

## 2016-03-21 NOTE — ED Provider Notes (Signed)
CSN: [id number]     Arrival date & time 03/21/16  1438 History  By signing my name below, I, Rowan Blase, attest that this documentation has been prepared under the direction and in the presence of non-physician practitioner, Gennesis Hogland Camprubi-Soms, PA-C. Electronically Signed: Rowan Blase, Scribe. 03/21/2016. 3:42 PM.   Chief Complaint  Patient presents with  . Hand Injury   Patient is a 55 y.o. male presenting with hand injury. The history is provided by the patient and medical records. No language interpreter was used.  Hand Injury Location:  Hand Time since incident:  2 days Injury: no   Hand location:  L hand Pain details:    Quality:  Throbbing   Radiates to:  Does not radiate   Severity:  Moderate   Onset quality:  Gradual   Duration:  2 days   Timing:  Constant   Progression:  Worsening Chronicity:  New Dislocation: no   Foreign body present:  No foreign bodies Relieved by:  NSAIDs Worsened by:  Movement Ineffective treatments:  None tried Associated symptoms: decreased range of motion (due to pain) and swelling   Associated symptoms: no fever, no muscle weakness, no numbness and no tingling    HPI Comments:  [name] is a 55 y.o. male with PMHx of HTN, heart murmur, arthritis, and gout, who presents to the Emergency Department complaining of 5/10 constant throbbing non-radiating pain over the 3rd knuckle of his left hand beginning 2 days ago, worsening this morning. Pain worsens when picking things up. Pt reports associated swelling to left hand and mild skin redness and warmth to the area. Denies any injury or trauma to hand but states he may have hit his hand on something throughout daily activities. He has taken Ibuprofen with mild relief. Pt ate steak and salmon and has been drinking alcohol recently. Pt does not take any medication regularly for gout. Denies red streaking, numbness, tingling, weakness, fever, chills, chest pain, shortness of breath,  nausea, vomiting, abdominal pain, diarrhea, constipation, dysuria, hematuria, other joint pain, myalgias, rash, or drainage from hand. Recalls an event in 08/2013 where he had L olecranon bursitis/?gout and was given prednisone and naprosyn at that time which helped tremendously. States his gout flares usually resolve on their own without medications. No PCP currently.    Past Medical History  Diagnosis Date  . Near syncope     05/2011 with tachypalpitations & with echo showing mild LVH, normal EF 55-60%, trivial AR  . Hypertension   . Heart murmur     Longstanding. 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 Arthritis     "knees; left hand"  . Excessive drinking alcohol     Admitted to 2-3 40oz beers every other night 02/2012   Past Surgical History  Procedure Laterality Date  . No past surgeries     Family History  Problem Relation Age of Onset  . Cancer Mother     Possibly pancreatic. Died at age 38   Social History  Substance Use Topics  . Smoking status: Never Smoker   . Smokeless tobacco: Never Used  . Alcohol Use: 0.0 oz/week     Comment: states he has cut back/ every now and again    Review of Systems  Constitutional: Negative for fever and chills.  Respiratory: Negative for shortness of breath.   Cardiovascular: Negative for chest pain.  Gastrointestinal: Negative for nausea, vomiting, abdominal pain, diarrhea and constipation.  Genitourinary: Negative for dysuria and  hematuria.  Musculoskeletal: Positive for joint swelling and arthralgias. Negative for myalgias.  Skin: Positive for color change (erythema). Negative for wound.  Allergic/Immunologic: Negative for immunocompromised state.  Neurological: Negative for weakness and numbness.  Psychiatric/Behavioral: Negative for confusion.  10 Systems reviewed and all are negative for acute change except as noted in the HPI.  Allergies  Review of patient's allergies indicates no known  allergies.  Home Medications   Prior to Admission medications   Medication Sig Start Date End Date Taking? Authorizing Provider  Aspirin-Caffeine (BAYER BACK & BODY PAIN EX ST) 500-32.5 MG TABS Take 1 tablet by mouth every 8 (eight) hours as needed (for pain).    Historical Provider, MD   BP 159/63 mmHg  Pulse 56  Temp(Src) 97.6 F (36.4 C) (Oral)  Resp 18  SpO2 98%   Physical Exam  Constitutional: He is oriented to person, place, and time. Vital signs are normal. He appears well-developed and well-nourished.  Non-toxic appearance. No distress.  Afebrile, nontoxic, NAD  HENT:  Head: Normocephalic and atraumatic.  Mouth/Throat: Mucous membranes are normal.  Eyes: Conjunctivae and EOM are normal. Right eye exhibits no discharge. Left eye exhibits no discharge.  Neck: Normal range of motion. Neck supple.  Cardiovascular: Normal rate and intact distal pulses.   Pulmonary/Chest: Effort normal. No respiratory distress.  Abdominal: Normal appearance. He exhibits no distension.  Musculoskeletal:       Left hand: He exhibits decreased range of motion (due to pain), tenderness, bony tenderness and swelling. He exhibits normal capillary refill, no deformity and no laceration. Normal sensation noted. Normal strength noted.       Hands: Left hand with mild swelling around the 3rd MCP joint, mild erythema with trace warmth, focal tenderness over the MCP joint, with minimally limited ROM at this joint due to pain but overall grip strength grossly intact, able to wiggle digits without issue, no lacerations or abrasions, no deformities, cap refill brisk and present, sensation grossly intact, compartments soft, no induration or fluctuance, no evidence of cellulitis.  Neurological: He is alert and oriented to person, place, and time. He has normal strength. No sensory deficit.  Skin: Skin is warm, dry and intact. No rash noted. There is erythema.  Psychiatric: He has a normal mood and affect.  Nursing  note and vitals reviewed.   ED Course  Procedures  DIAGNOSTIC STUDIES:  Oxygen Saturation is 98% on RA, normal by my interpretation.    COORDINATION OF CARE:  3:35 PM Will start pt on Prednisone. Discussed treatment plan with pt at bedside and pt agreed to plan.  Labs Review Labs Reviewed - No data to display  Imaging Review No results found. I have personally reviewed and evaluated these images and lab results as part of my medical decision-making.   EKG Interpretation None      MDM   Final diagnoses:  Acute gout of left hand, unspecified cause    55 y.o. male here with acute gout flare of L middle finger MCP joint. NVI with soft compartments, minimal warmth and erythema, no skin injury, doubt septic joint or cellulitis. Does not take meds for gout, states it usually resolves on its own. Had episode similar to this in 08/2013 where he was given prednisone and he recalls this helped tremendously. Recalls eating meat/fish and alcohol recently, just prior to onset of symptoms. Most recent labs in our system are from 05/2014 and he had borderline kidney function, will opt to use prednisone/naprosyn instead of colchicine since  we don't know his current kidney function. Discussed RICE and avoidance of foods that will cause gout to worsen. F/up with [facility] in 1-2wks for recheck and to establish care. I explained the diagnosis and have given explicit precautions to return to the ER including for any other new or worsening symptoms. The patient understands and accepts the medical plan as it's been dictated and I have answered their questions. Discharge instructions concerning home care and prescriptions have been given. The patient is STABLE and is discharged to home in good condition.   I personally performed the services described in this documentation, which was scribed in my presence. The recorded information has been reviewed and is accurate.  BP 159/63 mmHg  Pulse 56  Temp(Src) 97.6  F (36.4 C) (Oral)  Resp 18  SpO2 98%  Meds ordered this encounter  Medications  . naproxen (NAPROSYN) 500 MG tablet    Sig: Take 1 tablet (500 mg total) by mouth 2 (two) times daily as needed for mild pain, moderate pain or headache (TAKE WITH MEALS.).    Dispense:  20 tablet    Refill:  0    Order Specific Question:  Supervising Provider    Answer:  MILLER, BRIAN [[id number]]  . predniSONE (DELTASONE) 20 MG tablet    Sig: 3 tabs po daily x 4 days. TAKE IT WITH BREAKFAST    Dispense:  12 tablet    Refill:  0    Order Specific Question:  Supervising Provider    Answer:  Noemi Chapel [[id number]]      Ethel Meisenheimer Camprubi-Soms, PA-C 03/21/16 1549

## 2016-03-21 NOTE — Progress Notes (Addendum)
Pt woke with left hand swelling and pain and warmth. He does have a hx of gout but usually his right foot is effected. Pt denies any injury to his left wrist-pain is a 5/10. Report to the oncoming shift. (3:30pm )

## 2016-03-21 NOTE — ED Notes (Signed)
Left hand swelling x 2 days, worsening, redness and swelling evident, having difficulty moving fingers, denies any injury. No obvious trauma/deformity observed in triage.

## 2016-03-21 NOTE — Discharge Instructions (Signed)
Ice and elevate hand throughout the day. Use naprosyn and tylenol for pain relief. Take prednisone as directed, WITH BREAKFAST. DO NOT DRINK ALCOHOL and follow the diet below to help decrease your gout flares. Follow up with [facility] in 1-2 weeks for recheck of symptoms and to establish care. Return to the ER for changes or worsening symptoms, including but not limited to: spreading redness, fevers, increasing warmth and swelling, or drainage from the hand.    Gout Gout is when your joints become red, sore, and swell (inflamed). This is caused by the buildup of uric acid crystals in the joints. Uric acid is a chemical that is normally in the blood. If the level of uric acid gets too high in the blood, these crystals form in your joints and tissues. Over time, these crystals can form into masses near the joints and tissues. These masses can destroy bone and cause the bone to look misshapen (deformed). HOME CARE   Do not take aspirin for pain.  Only take medicine as told by your doctor.  Rest the joint as much as you can. When in bed, keep sheets and blankets off painful areas.  Keep the sore joints raised (elevated).  Put warm or cold packs on painful joints. Use of warm or cold packs depends on which works best for you.  Use crutches if the painful joint is in your leg.  Drink enough fluids to keep your pee (urine) clear or pale yellow. Limit alcohol, sugary drinks, and drinks with fructose in them.  Follow your diet instructions. Pay careful attention to how much protein you eat. Include fruits, vegetables, whole grains, and fat-free or low-fat milk products in your daily diet. Talk to your doctor or dietitian about the use of coffee, vitamin C, and cherries. These may help lower uric acid levels.  Keep a healthy body weight. GET HELP RIGHT AWAY IF:   You have watery poop (diarrhea), throw up (vomit), or have any side effects from medicines.  You do not feel better in 24  hours, or you are getting worse.  Your joint becomes suddenly more tender, and you have chills or a fever. MAKE SURE YOU:   Understand these instructions.  Will watch your condition.  Will get help right away if you are not doing well or get worse.   This information is not intended to replace advice given to you by your health care provider. Make sure you discuss any questions you have with your health care provider.   Document Released: 07/29/2008 Document Revised: 11/10/2014 Document Reviewed: 06/02/2012 Elsevier Interactive Patient Education 2016 [place] are compounds that affect the level of uric acid in your body. A low-purine diet is a diet that is low in purines. Eating a low-purine diet can prevent the level of uric acid in your body from getting too high and causing gout or kidney stones or both. WHAT DO I NEED TO KNOW ABOUT THIS DIET?  Choose low-purine foods. Examples of low-purine foods are listed in the next section.  Drink plenty of fluids, especially water. Fluids can help remove uric acid from your body. Try to drink 8-16 cups (1.9-3.8 L) a day.  Limit foods high in fat, especially saturated fat, as fat makes it harder for the body to get rid of uric acid. Foods high in saturated fat include pizza, cheese, ice cream, whole milk, fried foods, and gravies. Choose foods that are lower in fat and lean sources  of protein. Use olive oil when cooking as it contains healthy fats that are not high in saturated fat.  Limit alcohol. Alcohol interferes with the elimination of uric acid from your body. If you are having a gout attack, avoid all alcohol.  Keep in mind that different people's bodies react differently to different foods. You will probably learn over time which foods do or do not affect you. If you discover that a food tends to cause your gout to flare up, avoid eating that food. You can more freely enjoy foods that do not cause problems.  If you have any questions about a food item, talk to your dietitian or health care provider. WHICH FOODS ARE LOW, MODERATE, AND HIGH IN PURINES? The following is a list of foods that are low, moderate, and high in purines. You can eat any amount of the foods that are low in purines. You may be able to have small amounts of foods that are moderate in purines. Ask your health care provider how much of a food moderate in purines you can have. Avoid foods high in purines. Grains  Foods low in purines: Enriched white bread, pasta, rice, cake, cornbread, popcorn.  Foods moderate in purines: Whole-grain breads and cereals, wheat germ, bran, oatmeal. Uncooked oatmeal. Dry wheat bran or wheat germ.  Foods high in purines: Pancakes, [place] toast, biscuits, muffins. Vegetables  Foods low in purines: All vegetables, except those that are moderate in purines.  Foods moderate in purines: Asparagus, cauliflower, spinach, mushrooms, green peas. Fruits  All fruits are low in purines. Meats and other Protein Foods  Foods low in purines: Eggs, nuts, peanut butter.  Foods moderate in purines: 80-90% lean beef, lamb, veal, pork, poultry, fish, eggs, peanut butter, nuts. Crab, lobster, oysters, and shrimp. Cooked dried beans, peas, and lentils.  Foods high in purines: Anchovies, sardines, herring, mussels, tuna, codfish, scallops, trout, and haddock. Berniece Salines. Organ meats (such as liver or kidney). [place]. Game meat. Goose. Sweetbreads. Dairy  All dairy foods are low in purines. Low-fat and fat-free dairy products are best because they are low in saturated fat. Beverages  Drinks low in purines: Water, carbonated beverages, tea, coffee, cocoa.  Drinks moderate in purines: Soft drinks and other drinks sweetened with high-fructose corn syrup. Juices. To find whether a food or drink is sweetened with high-fructose corn syrup, look at the ingredients list.  Drinks high in purines: Alcoholic beverages (such as  beer). Condiments  Foods low in purines: Salt, herbs, olives, pickles, relishes, vinegar.  Foods moderate in purines: Butter, margarine, oils, mayonnaise. Fats and Oils  Foods low in purines: All types, except gravies and sauces made with meat.  Foods high in purines: Gravies and sauces made with meat. Other Foods  Foods low in purines: Sugars, sweets, gelatin. Cake. Soups made without meat.  Foods moderate in purines: Meat-based or fish-based soups, broths, or bouillons. Foods and drinks sweetened with high-fructose corn syrup.  Foods high in purines: High-fat desserts (such as ice cream, cookies, cakes, pies, doughnuts, and chocolate). Contact your dietitian for more information on foods that are not listed here.   This information is not intended to replace advice given to you by your health care provider. Make sure you discuss any questions you have with your health care provider.   Document Released: 02/14/2011 Document Revised: 10/25/2013 Document Reviewed: 09/26/2013 Elsevier Interactive Patient Education [employer].

## 2016-05-15 NOTE — ED Provider Notes (Signed)
Medical screening examination/treatment/procedure(s) were performed by non-physician practitioner and as supervising physician I was immediately available for consultation/collaboration.   EKG Interpretation None       Isla Pence, MD 05/15/16 1428

## 2016-10-17 ENCOUNTER — Emergency Department (HOSPITAL_COMMUNITY)
Admission: EM | Admit: 2016-10-17 | Discharge: 2016-10-17 | Disposition: A | Discharge status: Home / Self Care | Payer: Self-pay | Attending: Emergency Medicine | Admitting: Emergency Medicine

## 2016-10-17 ENCOUNTER — Encounter (HOSPITAL_COMMUNITY): Payer: Self-pay

## 2016-10-17 DIAGNOSIS — M109 Gout, unspecified: Secondary | ICD-10-CM | POA: Insufficient documentation

## 2016-10-17 DIAGNOSIS — Z7982 Long term (current) use of aspirin: Secondary | ICD-10-CM | POA: Insufficient documentation

## 2016-10-17 DIAGNOSIS — I10 Essential (primary) hypertension: Secondary | ICD-10-CM | POA: Insufficient documentation

## 2016-10-17 DIAGNOSIS — M79641 Pain in right hand: Secondary | ICD-10-CM

## 2016-10-17 MED ORDER — PREDNISONE 20 MG PO TABS: ORAL_TABLET | ORAL | 0 refills | Status: DC

## 2016-10-17 MED ORDER — NAPROXEN 500 MG PO TABS: 500.0000 mg | ORAL_TABLET | Freq: Two times a day (BID) | ORAL | 0 refills | Status: DC

## 2016-10-17 NOTE — ED Notes (Signed)
ED Provider at bedside. 

## 2016-10-17 NOTE — ED Triage Notes (Signed)
Pt here with swelling in rt hand. Started 3 days ago. Redness noted.  Pain at site.  No injury.

## 2016-10-17 NOTE — ED Provider Notes (Signed)
[facility] DEPT Provider Note   CSN: [id number] Arrival date & time: 10/17/16  8469     History   Chief Complaint Chief Complaint  Patient presents with  . Hand Pain    HPI [name] is a 55 y.o. male with a PMHx of gout, HTN, heart murmur, and alcohol abuse, who presents to the ED with complaints of right hand pain, erythema, warmth, and swelling 4 days. He states this feels similar to his prior gout attacks. He describes the pain is 7/10 constant shooting and throbbing pain over the right third knuckle, nonradiating, worse with holding his hand down with gravity as well as movement of fingers, and mildly improved with ibuprofen. Associated symptoms include swelling, erythema, and warmth over the third MCP joint. He admits to eating steak and drinking alcohol 5 days ago, just prior to onset of symptoms. He states this was the first time in several months that he had steak, and thinks this may have precipitated his gout attack. He does not take any medications for gout on a regular basis, has no PCP at this time.  He denies fevers, chills, CP, SOB, abd pain, N/V/D/C, hematuria, dysuria, wrist pain, numbness, tingling, weakness, or abrasions/cuts to area. Denies injury. 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    The history is provided by the patient and medical records. No language interpreter was used.  Hand Pain  This is a new problem. The current episode started more than 2 days ago. The problem occurs constantly. The problem has been gradually worsening. Pertinent negatives include no chest pain, no abdominal pain and no shortness of breath. The symptoms are aggravated by bending. The symptoms are relieved by NSAIDs. Treatments tried: ibuprofen. The treatment provided mild relief.    Past Medical History:  Diagnosis Date  . Arthritis    "knees; left hand"  . Excessive drinking alcohol    Admitted to 2-3 40oz beers every other night  02/2012  . Heart murmur    Longstanding. 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 Hypertension   . Near syncope    05/2011 with tachypalpitations & with echo showing mild LVH, normal EF 55-60%, trivial AR    Patient Active Problem List   Diagnosis Date Noted  . Gout 03/21/2016  . Chest pain 02/18/2012  . Alcohol abuse 02/18/2012  . Syncope 05/28/2011  . Palpitations 05/28/2011  . Murmur 05/28/2011    Past Surgical History:  Procedure Laterality Date  . NO PAST SURGERIES         Home Medications    Prior to Admission medications   Medication Sig Start Date End Date Taking? Authorizing Provider  Aspirin-Caffeine (BAYER BACK & BODY PAIN EX ST) 500-32.5 MG TABS Take 1 tablet by mouth every 8 (eight) hours as needed (for pain).    Historical Provider, MD  naproxen (NAPROSYN) 500 MG tablet Take 1 tablet (500 mg total) by mouth 2 (two) times daily as needed for mild pain, moderate pain or headache (TAKE WITH MEALS.). 03/21/16   Ronny Korff Camprubi-Soms, PA-C  predniSONE (DELTASONE) 20 MG tablet 3 tabs po daily x 4 days. TAKE IT WITH BREAKFAST 03/21/16   Genowefa Morga Camprubi-Soms, PA-C    Family History Family History  Problem Relation Age of Onset  . Cancer Mother     Possibly pancreatic. Died at age 16    Social History Social History  Substance Use Topics  . Smoking status: Never Smoker  .  Smokeless tobacco: Never Used  . Alcohol use 0.0 oz/week     Comment: states he has cut back/ every now and again     Allergies   Patient has no known allergies.   Review of Systems Review of Systems  Constitutional: Negative for chills and fever.  Respiratory: Negative for shortness of breath.   Cardiovascular: Negative for chest pain.  Gastrointestinal: Negative for abdominal pain, constipation, diarrhea, nausea and vomiting.  Genitourinary: Negative for dysuria and hematuria.  Musculoskeletal: Positive for arthralgias (R hand/3rd MCP joint) and joint  swelling (R hand). Negative for myalgias.  Skin: Positive for color change (R hand erythema/warmth). Negative for wound.  Allergic/Immunologic: Negative for immunocompromised state.  Neurological: Negative for weakness and numbness.  Psychiatric/Behavioral: Negative for confusion.   10 Systems reviewed and are negative for acute change except as noted in the HPI.   Physical Exam Updated Vital Signs BP 152/77 (BP Location: Left Arm)   Pulse (!) 50   Temp 97.6 F (36.4 C) (Oral)   Resp 18   SpO2 100%   Physical Exam  Constitutional: He is oriented to person, place, and time. Vital signs are normal. He appears well-developed and well-nourished.  Non-toxic appearance. No distress.  Afebrile, nontoxic, NAD  HENT:  Head: Normocephalic and atraumatic.  Mouth/Throat: Mucous membranes are normal.  Eyes: Conjunctivae and EOM are normal. Right eye exhibits no discharge. Left eye exhibits no discharge.  Neck: Normal range of motion. Neck supple.  Cardiovascular: Normal rate and intact distal pulses.   Pulmonary/Chest: Effort normal. No respiratory distress.  Abdominal: Normal appearance. He exhibits no distension.  Musculoskeletal:       Right hand: He exhibits decreased range of motion (due to pain), tenderness and swelling. He exhibits normal capillary refill, no deformity and no laceration. Normal sensation noted. Normal strength noted.       Hands: R hand with mild erythema, swelling, and warmth centered over the dorsal aspect of the 3rd MCP joint, mildly TTP over this area, with slightly limited ROM of 3rd MCP joint due to pain, but passive ROM able to be performed without difficulty. No other hand/wrist tenderness or erythema/warmth/swelling. No crepitus or deformity. Skin intact without abrasions/lacerations. Strength and sensation grossly intact, distal pulses intact, compartments soft.   Neurological: He is alert and oriented to person, place, and time. He has normal strength. No sensory  deficit.  Skin: Skin is warm, dry and intact. No rash noted.  Psychiatric: He has a normal mood and affect.  Nursing note and vitals reviewed.    ED Treatments / Results  Labs (all labs ordered are listed, but only abnormal results are displayed) Labs Reviewed - No data to display  EKG  EKG Interpretation None       Radiology No results found.  Procedures Procedures (including critical care time)  Medications Ordered in ED Medications - No data to display   Initial Impression / Assessment and Plan / ED Course  I have reviewed the triage vital signs and the nursing notes.  Pertinent labs & imaging results that were available during my care of the patient were reviewed by me and considered in my medical decision making (see chart for details).  Clinical Course     55 y.o. male here with gout flare of R 3rd MCP joint. Mild erythema and warmth over the area, mild swelling, moderate TTP. ROM limited due to pain, but passive ROM fully performed. NVI with soft compartments. No injury, doubt need for xray. States  it's similar to prior gout flares, recalls eating red meat and EtOH just prior to onset. Last labs from 2015, Cr 1.26, since we don't know what his updated kidney function is, will opt for prednisone/naprosyn treatment instead of colchicine. Discussed avoidance of high purine foods/EtOH, stay hydrated, ice/heat use, and additional tylenol as needed for pain. F/up with [facility] in 1wk for recheck and to establish care. I explained the diagnosis and have given explicit precautions to return to the ER including for any other new or worsening symptoms. The patient understands and accepts the medical plan as it's been dictated and I have answered their questions. Discharge instructions concerning home care and prescriptions have been given. The patient is STABLE and is discharged to home in good condition.   Final Clinical Impressions(s) / ED Diagnoses   Final diagnoses:  Acute  gout of right hand, unspecified cause  Right hand pain    New Prescriptions New Prescriptions   NAPROXEN (NAPROSYN) 500 MG TABLET    Take 1 tablet (500 mg total) by mouth 2 (two) times daily with a meal. x1 week, then as needed thereafter   PREDNISONE (DELTASONE) 20 MG TABLET    3 tabs po day one, then 2 tabs daily x 4 days     [employer], PA-C 10/17/16 [place], MD 10/17/16 [phone]

## 2016-10-17 NOTE — ED Notes (Signed)
Bed: WA04 Expected date:  Expected time:  Means of arrival:  Comments: 

## 2016-10-17 NOTE — Discharge Instructions (Signed)
Take naprosyn as directed for inflammation and pain with tylenol for breakthrough pain. Use ice and/or heat to areas of soreness, no more than 20 minutes at a time every hour for each. Use prednisone as directed to help with your gout flare. STOP EATING RED MEAT, SEAFOOD, AND ALCOHOL USE since this will worsen and cause gout attacks. Follow up with the [facility] for recheck of ongoing symptoms and to establish medical care in the next 1-2 weeks. Return to ER for emergent changing or worsening of symptoms.

## 2016-12-25 ENCOUNTER — Encounter (HOSPITAL_COMMUNITY): Payer: Self-pay | Admitting: Emergency Medicine

## 2016-12-25 ENCOUNTER — Emergency Department (HOSPITAL_COMMUNITY)
Admission: EM | Admit: 2016-12-25 | Discharge: 2016-12-25 | Disposition: A | Discharge status: Home / Self Care | Payer: Self-pay | Attending: Emergency Medicine | Admitting: Emergency Medicine

## 2016-12-25 DIAGNOSIS — M109 Gout, unspecified: Secondary | ICD-10-CM | POA: Insufficient documentation

## 2016-12-25 DIAGNOSIS — Z7982 Long term (current) use of aspirin: Secondary | ICD-10-CM | POA: Insufficient documentation

## 2016-12-25 DIAGNOSIS — I10 Essential (primary) hypertension: Secondary | ICD-10-CM | POA: Insufficient documentation

## 2016-12-25 MED ORDER — PREDNISONE 50 MG PO TABS: ORAL_TABLET | ORAL | 0 refills | Status: DC

## 2016-12-25 MED ORDER — OXYCODONE-ACETAMINOPHEN 5-325 MG PO TABS: 1.0000 | ORAL_TABLET | ORAL | 0 refills | Status: DC | PRN

## 2016-12-25 NOTE — ED Provider Notes (Signed)
[facility] DEPT Provider Note   CSN: [id number] Arrival date & time: 12/25/16  1439     History   Chief Complaint Chief Complaint  Patient presents with  . Arm Pain   HPI   Blood pressure 144/64, pulse 62, temperature 98.2 F (36.8 C), temperature source Oral, resp. rate 18, height '5\' 7"'$  (1.702 m), weight 90.7 kg, SpO2 99 %.  [name] is a 56 y.o. male complaining of hand pain worsening over the course of the week is on the dorsum of the right hand (he is right-hand dominant) radiates up the arm to the elbow. He denies trauma, fever, chills, reduced range of motion. He is has been taking over-the-counter NSAIDs with little relief. This is similar to prior gouty exacerbations. He does not have primary care has never seen an orthopedist for this.  Past Medical History:  Diagnosis Date  . Arthritis    "knees; left hand"  . Excessive drinking alcohol    Admitted to 2-3 40oz beers every other night 02/2012  . Heart murmur    Longstanding. 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 Hypertension   . Near syncope    05/2011 with tachypalpitations & with echo showing mild LVH, normal EF 55-60%, trivial AR    Patient Active Problem List   Diagnosis Date Noted  . Gout 03/21/2016  . Chest pain 02/18/2012  . Alcohol abuse 02/18/2012  . Syncope 05/28/2011  . Palpitations 05/28/2011  . Murmur 05/28/2011    Past Surgical History:  Procedure Laterality Date  . NO PAST SURGERIES         Home Medications    Prior to Admission medications   Medication Sig Start Date End Date Taking? Authorizing Provider  aspirin 325 MG tablet Take 325 mg by mouth daily.    Historical Provider, MD  Aspirin-Caffeine (BAYER BACK & BODY PAIN EX ST) 500-32.5 MG TABS Take 1 tablet by mouth every 8 (eight) hours as needed (for pain).    Historical Provider, MD  ibuprofen (ADVIL,MOTRIN) 200 MG tablet Take 400 mg by mouth every 6 (six) hours as needed.    Historical Provider,  MD  naproxen (NAPROSYN) 500 MG tablet Take 1 tablet (500 mg total) by mouth 2 (two) times daily as needed for mild pain, moderate pain or headache (TAKE WITH MEALS.). Patient not taking: Reported on 10/17/2016 03/21/16   [address], PA-C  naproxen (NAPROSYN) 500 MG tablet Take 1 tablet (500 mg total) by mouth 2 (two) times daily with a meal. x1 week, then as needed thereafter 10/17/16   [employer], PA-C  oxyCODONE-acetaminophen (PERCOCET) 5-325 MG tablet Take 1 tablet by mouth every 4 (four) hours as needed. 12/25/16   Peterson Mathey, PA-C  predniSONE (DELTASONE) 50 MG tablet Take 1 tablet daily with breakfast 12/25/16   Monico Blitz, PA-C    Family History Family History  Problem Relation Age of Onset  . Cancer Mother     Possibly pancreatic. Died at age 59    Social History Social History  Substance Use Topics  . Smoking status: Never Smoker  . Smokeless tobacco: Never Used  . Alcohol use 0.0 oz/week     Comment: states he has cut back/ every now and again     Allergies   Patient has no known allergies.   Review of Systems Review of Systems  10 systems reviewed and found to be negative, except as noted in the HPI.   Physical Exam Updated Vital Signs  BP 144/64 (BP Location: Left Arm)   Pulse 62   Temp 98.2 F (36.8 C) (Oral)   Resp 18   Ht '5\' 7"'$  (1.702 m)   Wt 90.7 kg   SpO2 99%   BMI 31.32 kg/m   Physical Exam  Constitutional: He is oriented to person, place, and time. He appears well-developed and well-nourished. No distress.  HENT:  Head: Normocephalic and atraumatic.  Mouth/Throat: Oropharynx is clear and moist.  Eyes: Conjunctivae and EOM are normal. Pupils are equal, round, and reactive to light.  Neck: Normal range of motion.  Cardiovascular: Normal rate, regular rhythm and intact distal pulses.   Pulmonary/Chest: Effort normal and breath sounds normal.  Abdominal: Soft. There is no tenderness.  Musculoskeletal: Normal range of motion. He  exhibits edema and tenderness.  Dorsum of right hand with erythema and tenderness over the MCP of third digit excellent range of motion, distally neurovascularly intact, no overlying cellulitis. Full range of motion to wrist and elbow. Skin is intact with no trauma  Neurological: He is alert and oriented to person, place, and time.  Skin: He is not diaphoretic.  Psychiatric: He has a normal mood and affect.  Nursing note and vitals reviewed.    ED Treatments / Results  Labs (all labs ordered are listed, but only abnormal results are displayed) Labs Reviewed - No data to display  EKG  EKG Interpretation None       Radiology No results found.  Procedures Procedures (including critical care time)  Medications Ordered in ED Medications - No data to display   Initial Impression / Assessment and Plan / ED Course  I have reviewed the triage vital signs and the nursing notes.  Pertinent labs & imaging results that were available during my care of the patient were reviewed by me and considered in my medical decision making (see chart for details).     Vitals:   12/25/16 1447 12/25/16 1448  BP: 144/64   Pulse: 62   Resp: 18   Temp: 98.2 F (36.8 C)   TempSrc: Oral   SpO2: 99%   Weight:  90.7 kg  Height:  '5\' 7"'$  (1.702 m)    Medications - No data to display  [name] is 56 y.o. male presenting with Atraumatic right hand pain consistent with prior gouty episodes. Vital signs reassuring, this is not appear to be a cellulitis. Patient given prednisone burst and Percocet, orthopedic referral.  Evaluation does not show pathology that would require ongoing emergent intervention or inpatient treatment. Pt is hemodynamically stable and mentating appropriately. Discussed findings and plan with patient/guardian, who agrees with care plan. All questions answered. Return precautions discussed and outpatient follow up given.      Final Clinical Impressions(s) / ED Diagnoses     Final diagnoses:  Acute gout of right hand, unspecified cause    New Prescriptions New Prescriptions   OXYCODONE-ACETAMINOPHEN (PERCOCET) 5-325 MG TABLET    Take 1 tablet by mouth every 4 (four) hours as needed.   PREDNISONE (DELTASONE) 50 MG TABLET    Take 1 tablet daily with breakfast     Monico Blitz, PA-C 12/25/16 [place], MD 01/11/17 [phone]

## 2016-12-25 NOTE — Discharge Instructions (Signed)
Take percocet for breakthrough pain, do not drink alcohol, drive, care for children or do other critical tasks while taking percocet.

## 2016-12-25 NOTE — ED Notes (Signed)
Discharge instructions, follow up care, and rx x2 reviewed with patient. Patient verbalized understanding. 

## 2016-12-25 NOTE — ED Triage Notes (Signed)
Patient c/o right arm pain x1 week. Denies injury. Reports right hand is "a little swollen."

## 2017-02-17 ENCOUNTER — Encounter (HOSPITAL_COMMUNITY): Payer: Self-pay

## 2017-02-17 ENCOUNTER — Emergency Department (HOSPITAL_COMMUNITY): Payer: Self-pay

## 2017-02-17 ENCOUNTER — Emergency Department (HOSPITAL_COMMUNITY)
Admission: EM | Admit: 2017-02-17 | Discharge: 2017-02-18 | Disposition: A | Discharge status: Home / Self Care | Payer: Self-pay | Attending: Emergency Medicine | Admitting: Emergency Medicine

## 2017-02-17 DIAGNOSIS — M545 Low back pain, unspecified: Secondary | ICD-10-CM

## 2017-02-17 DIAGNOSIS — Z79899 Other long term (current) drug therapy: Secondary | ICD-10-CM | POA: Insufficient documentation

## 2017-02-17 DIAGNOSIS — Z7982 Long term (current) use of aspirin: Secondary | ICD-10-CM | POA: Insufficient documentation

## 2017-02-17 DIAGNOSIS — I10 Essential (primary) hypertension: Secondary | ICD-10-CM | POA: Insufficient documentation

## 2017-02-17 DIAGNOSIS — R109 Unspecified abdominal pain: Secondary | ICD-10-CM | POA: Insufficient documentation

## 2017-02-17 NOTE — ED Notes (Addendum)
Pt c/o Shortness of breath after a sudden pain had started in his left flank and radiated to his left arm 45 minutes ago. Is now feeling a little bit lightheaded.

## 2017-02-18 ENCOUNTER — Emergency Department (HOSPITAL_COMMUNITY): Payer: Self-pay

## 2017-02-18 LAB — URINALYSIS, ROUTINE W REFLEX MICROSCOPIC
Bilirubin Urine: NEGATIVE
GLUCOSE, UA: NEGATIVE mg/dL
HGB URINE DIPSTICK: NEGATIVE
Ketones, ur: NEGATIVE mg/dL
LEUKOCYTES UA: NEGATIVE
Nitrite: NEGATIVE
PROTEIN: NEGATIVE mg/dL
Specific Gravity, Urine: 1.009 (ref 1.005–1.030)
pH: 5 (ref 5.0–8.0)

## 2017-02-18 LAB — CBC WITH DIFFERENTIAL/PLATELET
BASOS PCT: 1 %
Basophils Absolute: 0 10*3/uL (ref 0.0–0.1)
Eosinophils Absolute: 0.2 10*3/uL (ref 0.0–0.7)
Eosinophils Relative: 2 %
HEMATOCRIT: 42.3 % (ref 39.0–52.0)
HEMOGLOBIN: 13.9 g/dL (ref 13.0–17.0)
LYMPHS ABS: 1.6 10*3/uL (ref 0.7–4.0)
Lymphocytes Relative: 22 %
MCH: 27 pg (ref 26.0–34.0)
MCHC: 32.9 g/dL (ref 30.0–36.0)
MCV: 82.3 fL (ref 78.0–100.0)
MONOS PCT: 7 %
Monocytes Absolute: 0.5 10*3/uL (ref 0.1–1.0)
NEUTROS ABS: 4.9 10*3/uL (ref 1.7–7.7)
NEUTROS PCT: 68 %
Platelets: 217 10*3/uL (ref 150–400)
RBC: 5.14 MIL/uL (ref 4.22–5.81)
RDW: 13.1 % (ref 11.5–15.5)
WBC: 7.2 10*3/uL (ref 4.0–10.5)

## 2017-02-18 LAB — BASIC METABOLIC PANEL
ANION GAP: 7 (ref 5–15)
BUN: 23 mg/dL — ABNORMAL HIGH (ref 6–20)
CHLORIDE: 106 mmol/L (ref 101–111)
CO2: 24 mmol/L (ref 22–32)
Calcium: 9 mg/dL (ref 8.9–10.3)
Creatinine, Ser: 1.06 mg/dL (ref 0.61–1.24)
GFR calc non Af Amer: >60 mL/min (ref >60)
Glucose, Bld: 128 mg/dL — ABNORMAL HIGH (ref 65–99)
Potassium: 4 mmol/L (ref 3.5–5.1)
Sodium: 137 mmol/L (ref 135–145)

## 2017-02-18 LAB — I-STAT TROPONIN, ED: Troponin i, poc: 0 ng/mL (ref 0.00–0.08)

## 2017-02-18 NOTE — ED Provider Notes (Signed)
[facility] DEPT Provider Note   CSN: [id number] Arrival date & time: 02/17/17  2041   By signing my name below, I, Delton Prairie, attest that this documentation has been prepared under the direction and in the presence of Quintella Reichert, MD  Electronically Signed: Delton Prairie, ED Scribe. 02/18/17. 12:14 AM.   History   Chief Complaint Chief Complaint  Patient presents with  . Shortness of Breath  . Back Pain    HPI Comments:  [name] is a 56 y.o. male, with a PMHx of HTN, who presents to the Emergency Department complaining of an acute onset episode of lower back pain which occurred around 8:30 PM yesterday while he was sitting and watching TV. Pt states his pain radiated to his left side during this episode of pain. He also reports SOB secondary to pain and tingling to his fingers. He notes a hx of similar symptoms a long time ago. Pt reports a family hx of HTN and DM. No alleviating or modifying factors noted. Pt denies fevers, nausea, vomiting, dysuria, hematuria, diarrhea, numbness to his legs, weakness, chest pain, leg swelling or any other associated symptoms. Pt denies daily medication use, any drug allergies and any recent surgeries. No other complaints noted.   The history is provided by the patient. No language interpreter was used.    Past Medical History:  Diagnosis Date  . Arthritis    "knees; left hand"  . Excessive drinking alcohol    Admitted to 2-3 40oz beers every other night 02/2012  . Heart murmur    Longstanding. 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 Hypertension   . Near syncope    05/2011 with tachypalpitations & with echo showing mild LVH, normal EF 55-60%, trivial AR    Patient Active Problem List   Diagnosis Date Noted  . Gout 03/21/2016  . Chest pain 02/18/2012  . Alcohol abuse 02/18/2012  . Syncope 05/28/2011  . Palpitations 05/28/2011  . Murmur 05/28/2011    Past Surgical History:  Procedure Laterality  Date  . NO PAST SURGERIES      Home Medications    Prior to Admission medications   Medication Sig Start Date End Date Taking? Authorizing Provider  aspirin 325 MG tablet Take 325 mg by mouth daily.    Historical Provider, MD  Aspirin-Caffeine (BAYER BACK & BODY PAIN EX ST) 500-32.5 MG TABS Take 1 tablet by mouth every 8 (eight) hours as needed (for pain).    Historical Provider, MD  ibuprofen (ADVIL,MOTRIN) 200 MG tablet Take 400 mg by mouth every 6 (six) hours as needed.    Historical Provider, MD  naproxen (NAPROSYN) 500 MG tablet Take 1 tablet (500 mg total) by mouth 2 (two) times daily as needed for mild pain, moderate pain or headache (TAKE WITH MEALS.). Patient not taking: Reported on 10/17/2016 03/21/16   Mercedes [address], PA-C  naproxen (NAPROSYN) 500 MG tablet Take 1 tablet (500 mg total) by mouth 2 (two) times daily with a meal. x1 week, then as needed thereafter 10/17/16   [employer], PA-C  oxyCODONE-acetaminophen (PERCOCET) 5-325 MG tablet Take 1 tablet by mouth every 4 (four) hours as needed. 12/25/16   Nicole Pisciotta, PA-C  predniSONE (DELTASONE) 50 MG tablet Take 1 tablet daily with breakfast 12/25/16   Monico Blitz, PA-C    Family History Family History  Problem Relation Age of Onset  . Cancer Mother     Possibly pancreatic. Died at age 18  Social History Social History  Substance Use Topics  . Smoking status: Never Smoker  . Smokeless tobacco: Never Used  . Alcohol use 0.0 oz/week     Comment: states he has cut back/ every now and again     Allergies   Patient has no known allergies.   Review of Systems Review of Systems  Respiratory: Positive for shortness of breath.   Cardiovascular: Negative for chest pain and leg swelling.  Gastrointestinal: Negative for diarrhea, nausea and vomiting.  Genitourinary: Negative for dysuria and hematuria.  Musculoskeletal: Positive for back pain and myalgias.  Neurological: Negative for weakness and numbness.    All other systems reviewed and are negative.  Physical Exam Updated Vital Signs BP (!) 149/76 (BP Location: Right Arm)   Pulse (!) 55   Temp 99 F (37.2 C) (Oral)   Resp 16   SpO2 100%   Physical Exam  Constitutional: He is oriented to person, place, and time. He appears well-developed and well-nourished.  HENT:  Head: Normocephalic and atraumatic.  Cardiovascular: Normal rate and regular rhythm.   No murmur heard. Pulses:      Radial pulses are 2+ on the right side, and 2+ on the left side.       Dorsalis pedis pulses are 2+ on the right side, and 2+ on the left side.  2+ radial pulse bilaterally. 2+ DP pulse bilaterally   Pulmonary/Chest: Effort normal and breath sounds normal. No respiratory distress.  Abdominal: Soft. There is no tenderness. There is no rebound and no guarding.  Musculoskeletal: He exhibits no edema or tenderness.  No back tenderness.    Neurological: He is alert and oriented to person, place, and time.  Skin: Skin is warm and dry.  Psychiatric: He has a normal mood and affect. His behavior is normal.  Nursing note and vitals reviewed.    ED Treatments / Results  DIAGNOSTIC STUDIES:  Oxygen Saturation is 98% on RA, normal by my interpretation.    COORDINATION OF CARE:  12:10 AM Discussed treatment plan with pt at bedside and pt agreed to plan.  Labs (all labs ordered are listed, but only abnormal results are displayed) Labs Reviewed  BASIC METABOLIC PANEL - Abnormal; Notable for the following:       Result Value   Glucose, Bld 128 (*)    BUN 23 (*)    All other components within normal limits  URINALYSIS, ROUTINE W REFLEX MICROSCOPIC - Abnormal; Notable for the following:    Color, Urine STRAW (*)    All other components within normal limits  CBC WITH DIFFERENTIAL/PLATELET  Randolm Idol, ED    EKG  EKG Interpretation  Date/Time:  Tuesday February 17 2017 20:51:34 EDT Ventricular Rate:  59 PR Interval:    QRS Duration: 88 QT  Interval:  414 QTC Calculation: 411 R Axis:   21 Text Interpretation:  Sinus rhythm Confirmed by Hazle Coca [phone]) on 02/17/2017 11:58:55 PM       Radiology Dg Chest 2 View  Result Date: 02/17/2017 CLINICAL DATA:  Sudden onset mid back pain radiating to left anterior chest beginning 2 hours ago with shortness-of-breath. EXAM: CHEST  2 VIEW COMPARISON:  09/17/2012 FINDINGS: Lungs are somewhat hypoinflated without focal consolidation or effusion. Cardiomediastinal silhouette, bones and soft tissues are within normal. IMPRESSION: No active cardiopulmonary disease. Electronically Signed   By: Marin Olp M.D.   On: 02/17/2017 21:58   Ct Renal Stone Study  Result Date: 02/18/2017 CLINICAL DATA:  Low back pain  radiating to the left flank for several hours. Prior history kidney stones. Hypertension. EXAM: CT ABDOMEN AND PELVIS WITHOUT CONTRAST TECHNIQUE: Multidetector CT imaging of the abdomen and pelvis was performed following the standard protocol without IV contrast. COMPARISON:  07/10/2011 FINDINGS: Lower chest: The lung bases are clear. Hepatobiliary: Circumscribed low-attenuation lesions in the liver, largest measuring 2.1 cm in diameter. These are unchanged since previous study and likely represent small cysts. Gallbladder and bile ducts are unremarkable. Pancreas: Unremarkable. No pancreatic ductal dilatation or surrounding inflammatory changes. Spleen: Calcified granulomas in the spleen. Adrenals/Urinary Tract: No adrenal gland nodules. Kidneys appear symmetrical. No hydronephrosis or hydroureter. No renal, ureteral, or bladder stones. No bladder wall thickening. Stomach/Bowel: Stomach is within normal limits. Appendix appears normal. No evidence of bowel wall thickening, distention, or inflammatory changes. Vascular/Lymphatic: Aortic atherosclerosis. No enlarged abdominal or pelvic lymph nodes. Reproductive: Prostate is unremarkable. Other: No abdominal wall hernia or abnormality. No  abdominopelvic ascites. Fat in the inguinal canals bilaterally. Musculoskeletal: No destructive bone lesions. IMPRESSION: No renal or ureteral stone or obstruction. Benign-appearing hepatic cysts. No acute process identified on noncontrast imaging. Electronically Signed   By: Lucienne Capers M.D.   On: 02/18/2017 01:28    Procedures Procedures (including critical care time)  Medications Ordered in ED Medications - No data to display   Initial Impression / Assessment and Plan / ED Course  I have reviewed the triage vital signs and the nursing notes.  Pertinent labs & imaging results that were available during my care of the patient were reviewed by me and considered in my medical decision making (see chart for details).     Patient here for evaluation of episode of low back pain with associated shortness of breath. Presentation is not consistent with ACS, PE, dissection. No evidence of obstructing stone or UTI. He is asymptomatic in the emergency department. Counseled pt on home care for low back pain, possible muscle spasm. Discussed outpatient follow up precautions.  Final Clinical Impressions(s) / ED Diagnoses   Final diagnoses:  Acute left-sided low back pain without sciatica    New Prescriptions Discharge Medication List as of 02/18/2017  1:44 AM    I personally performed the services described in this documentation, which was scribed in my presence. The recorded information has been reviewed and is accurate.     Quintella Reichert, MD 02/18/17 [phone]

## 2017-04-03 ENCOUNTER — Emergency Department (HOSPITAL_COMMUNITY)
Admission: EM | Admit: 2017-04-03 | Discharge: 2017-04-03 | Disposition: A | Discharge status: Home / Self Care | Payer: Self-pay | Attending: Emergency Medicine | Admitting: Emergency Medicine

## 2017-04-03 ENCOUNTER — Emergency Department (HOSPITAL_COMMUNITY): Payer: Self-pay

## 2017-04-03 ENCOUNTER — Encounter (HOSPITAL_COMMUNITY): Payer: Self-pay

## 2017-04-03 DIAGNOSIS — M10042 Idiopathic gout, left hand: Secondary | ICD-10-CM | POA: Insufficient documentation

## 2017-04-03 DIAGNOSIS — Z79899 Other long term (current) drug therapy: Secondary | ICD-10-CM | POA: Insufficient documentation

## 2017-04-03 DIAGNOSIS — M109 Gout, unspecified: Secondary | ICD-10-CM

## 2017-04-03 DIAGNOSIS — I10 Essential (primary) hypertension: Secondary | ICD-10-CM | POA: Insufficient documentation

## 2017-04-03 DIAGNOSIS — Z7982 Long term (current) use of aspirin: Secondary | ICD-10-CM | POA: Insufficient documentation

## 2017-04-03 MED ORDER — PREDNISONE 10 MG PO TABS: ORAL_TABLET | ORAL | 0 refills | Status: DC

## 2017-04-03 MED ORDER — DICLOFENAC SODIUM 75 MG PO TBEC: 75.0000 mg | DELAYED_RELEASE_TABLET | Freq: Two times a day (BID) | ORAL | 0 refills | Status: DC

## 2017-04-03 NOTE — Discharge Instructions (Signed)
Return if any problems.

## 2017-04-03 NOTE — ED Provider Notes (Signed)
[facility] DEPT Provider Note   CSN: [id number] Arrival date & time: 04/03/17  1732     History   Chief Complaint Chief Complaint  Patient presents with  . Hand Problem    HPI [name] is a 56 y.o. male.  The history is provided by the patient. No language interpreter was used.  Hand Pain  This is a new problem. The current episode started more than 2 days ago. The problem occurs constantly. The problem has been gradually worsening. Nothing aggravates the symptoms. Nothing relieves the symptoms. He has tried nothing for the symptoms. The treatment provided no relief.  Pt has a history of gout.  Pt complains of swelling in his left hand. Pt has a history of htn   Past Medical History:  Diagnosis Date  . Arthritis    "knees; left hand"  . Excessive drinking alcohol    Admitted to 2-3 40oz beers every other night 02/2012  . Heart murmur    Longstanding. 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 Hypertension   . Near syncope    05/2011 with tachypalpitations & with echo showing mild LVH, normal EF 55-60%, trivial AR    Patient Active Problem List   Diagnosis Date Noted  . Gout 03/21/2016  . Chest pain 02/18/2012  . Alcohol abuse 02/18/2012  . Syncope 05/28/2011  . Palpitations 05/28/2011  . Murmur 05/28/2011    Past Surgical History:  Procedure Laterality Date  . NO PAST SURGERIES         Home Medications    Prior to Admission medications   Medication Sig Start Date End Date Taking? Authorizing Provider  aspirin 325 MG tablet Take 325 mg by mouth daily.    [provider]  Aspirin-Caffeine (BAYER BACK & BODY PAIN EX ST) 500-32.5 MG TABS Take 1 tablet by mouth every 8 (eight) hours as needed (for pain).    [provider]  diclofenac (VOLTAREN) 75 MG EC tablet Take 1 tablet (75 mg total) by mouth 2 (two) times daily. 04/03/17   Fransico Meadow, PA-C  ibuprofen (ADVIL,MOTRIN) 200 MG tablet Take 400 mg by mouth every 6  (six) hours as needed.    [provider]  predniSONE (DELTASONE) 10 MG tablet 6,5,4,3,2,1 taper 04/03/17   Fransico Meadow, PA-C    Family History Family History  Problem Relation Age of Onset  . Cancer Mother        Possibly pancreatic. Died at age 68    Social History Social History  Substance Use Topics  . Smoking status: Never Smoker  . Smokeless tobacco: Never Used  . Alcohol use 0.0 oz/week     Comment: states he has cut back/ every now and again     Allergies   Patient has no known allergies.   Review of Systems Review of Systems  All other systems reviewed and are negative.    Physical Exam Updated Vital Signs BP (!) 183/73 (BP Location: Left Arm)   Pulse 70   Temp 98.2 F (36.8 C) (Oral)   Resp 15   Ht 5\' 7"  (1.702 m)   Wt 95.3 kg (210 lb)   SpO2 100%   BMI 32.89 kg/m   Physical Exam  Constitutional: He is oriented to person, place, and time. He appears well-developed and well-nourished.  HENT:  Head: Normocephalic.  Eyes: EOM are normal.  Neck: Normal range of motion.  Pulmonary/Chest: Effort normal.  Abdominal: He exhibits no distension.  Musculoskeletal: He exhibits tenderness.  Swollen tender left hand,  Pain with movement,  nv and ns intact  Neurological: He is alert and oriented to person, place, and time.  Psychiatric: He has a normal mood and affect.  Nursing note and vitals reviewed.    ED Treatments / Results  Labs (all labs ordered are listed, but only abnormal results are displayed) Labs Reviewed - No data to display  EKG  EKG Interpretation None       Radiology Dg Hand Complete Left  Result Date: 04/03/2017 CLINICAL DATA:  Lung pain and swelling to left hand for several days, no known injury, initial encounter EXAM: LEFT HAND - COMPLETE 3+ VIEW COMPARISON:  None. FINDINGS: No acute fracture or dislocation is noted. Some degenerative changes in the fifth PIP joint are noted with erosive changes in the distal aspect  of the fifth proximal phalanx. No soft tissue abnormality is noted. IMPRESSION: Degenerative changes about the fifth PIP joint. Electronically Signed   By: Inez Catalina M.D.   On: 04/03/2017 20:15    Procedures Procedures (including critical care time)  Medications Ordered in ED Medications - No data to display   Initial Impression / Assessment and Plan / ED Course  I have reviewed the triage vital signs and the nursing notes.  Pertinent labs & imaging results that were available during my care of the patient were reviewed by me and considered in my medical decision making (see chart for details).       Final Clinical Impressions(s) / ED Diagnoses   Final diagnoses:  Acute gout of left hand, unspecified cause    New Prescriptions Discharge Medication List as of 04/03/2017  8:39 PM    START taking these medications   Details  diclofenac (VOLTAREN) 75 MG EC tablet Take 1 tablet (75 mg total) by mouth 2 (two) times daily., Starting Fri 04/03/2017, Print      An After Visit Summary was printed and given to the patient.    Sidney Ace 04/03/17 2102    Tanna Furry, MD 04/13/17 [phone]

## 2017-04-03 NOTE — ED Triage Notes (Signed)
Pt c/o L hand swelling and pain. He states that he is unable to move it or make a fist without pain. He also endorses warmth to touch. Denies injury. A&Ox4.

## 2017-12-11 ENCOUNTER — Encounter (HOSPITAL_COMMUNITY): Payer: Self-pay | Admitting: *Deleted

## 2017-12-11 ENCOUNTER — Emergency Department (HOSPITAL_COMMUNITY): Payer: Self-pay

## 2017-12-11 DIAGNOSIS — I10 Essential (primary) hypertension: Secondary | ICD-10-CM | POA: Insufficient documentation

## 2017-12-11 DIAGNOSIS — Z7982 Long term (current) use of aspirin: Secondary | ICD-10-CM | POA: Insufficient documentation

## 2017-12-11 DIAGNOSIS — M25562 Pain in left knee: Secondary | ICD-10-CM | POA: Insufficient documentation

## 2017-12-11 DIAGNOSIS — M7989 Other specified soft tissue disorders: Secondary | ICD-10-CM | POA: Insufficient documentation

## 2017-12-11 NOTE — ED Triage Notes (Signed)
Pt reports pain and swelling to the anterior left knee x5 days. Reports hx of gout, but states this pain is lasting longer than normal. He is taking OTC meds for the pain.

## 2017-12-12 ENCOUNTER — Emergency Department (HOSPITAL_COMMUNITY)
Admission: EM | Admit: 2017-12-12 | Discharge: 2017-12-12 | Disposition: A | Discharge status: Home / Self Care | Payer: Self-pay | Attending: Emergency Medicine | Admitting: Emergency Medicine

## 2017-12-12 DIAGNOSIS — M25562 Pain in left knee: Secondary | ICD-10-CM

## 2017-12-12 MED ORDER — OXYCODONE-ACETAMINOPHEN 5-325 MG PO TABS
1.0000 | ORAL_TABLET | Freq: Once | ORAL | Status: AC
  Administered 2017-12-12: 1 via ORAL
  Filled 2017-12-12: qty 1

## 2017-12-12 MED ORDER — OXYCODONE-ACETAMINOPHEN 5-325 MG PO TABS
1.0000 | ORAL_TABLET | ORAL | 0 refills | Status: DC | PRN
Start: 2017-12-12 — End: 2018-03-27

## 2017-12-12 MED ORDER — PREDNISONE 20 MG PO TABS
60.0000 mg | ORAL_TABLET | Freq: Once | ORAL | Status: AC
  Administered 2017-12-12: 60 mg via ORAL
  Filled 2017-12-12: qty 3

## 2017-12-12 MED ORDER — PREDNISONE 20 MG PO TABS: ORAL_TABLET | ORAL | 0 refills | Status: DC

## 2017-12-12 NOTE — ED Provider Notes (Signed)
[facility] DEPT Provider Note   CSN: [id number] Arrival date & time: 12/11/17  2002     History   Chief Complaint Chief Complaint  Patient presents with  . Knee Pain    HPI [name] is a 57 y.o. male.  The history is provided by the patient and medical records.  Knee Pain      57 year old male with history of arthritis, alcohol abuse, hypertension, gout, presenting to the ED with left knee pain and swelling.  Has been ongoing for about 3 days now but getting progressively worse.  States his knee hurts when his pant leg even touches it.  States his left leg has been "warm to touch" but he denies any fever or chills.  Has been able to continue working but a lot of pain when up and moving around.  He denies any recent falls or trauma.  States he gets gout flares about every 2 months or so.  He denies any recent alcohol, red meat, or fish intake.  Past Medical History:  Diagnosis Date  . Arthritis    "knees; left hand"  . Excessive drinking alcohol    Admitted to 2-3 40oz beers every other night 02/2012  . Heart murmur    Longstanding. 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 Hypertension   . Near syncope    05/2011 with tachypalpitations & with echo showing mild LVH, normal EF 55-60%, trivial AR    Patient Active Problem List   Diagnosis Date Noted  . Gout 03/21/2016  . Chest pain 02/18/2012  . Alcohol abuse 02/18/2012  . Syncope 05/28/2011  . Palpitations 05/28/2011  . Murmur 05/28/2011    Past Surgical History:  Procedure Laterality Date  . NO PAST SURGERIES         Home Medications    Prior to Admission medications   Medication Sig Start Date End Date Taking? Authorizing Provider  aspirin 325 MG tablet Take 325 mg by mouth daily.    [provider]  Aspirin-Caffeine (BAYER BACK & BODY PAIN EX ST) 500-32.5 MG TABS Take 1 tablet by mouth every 8 (eight) hours as needed (for pain).    [provider]  diclofenac (VOLTAREN) 75 MG EC tablet Take 1 tablet (75 mg total) by mouth 2 (two) times daily. 04/03/17   Fransico Meadow, PA-C  ibuprofen (ADVIL,MOTRIN) 200 MG tablet Take 400 mg by mouth every 6 (six) hours as needed.    [provider]  predniSONE (DELTASONE) 10 MG tablet 6,5,4,3,2,1 taper 04/03/17   Fransico Meadow, PA-C    Family History Family History  Problem Relation Age of Onset  . Cancer Mother        Possibly pancreatic. Died at age 61    Social History Social History   Tobacco Use  . Smoking status: Never Smoker  . Smokeless tobacco: Never Used  Substance Use Topics  . Alcohol use: Yes    Alcohol/week: 0.0 oz    Comment: states he has cut back/ every now and again  . Drug use: No     Allergies   Patient has no known allergies.   Review of Systems Review of Systems  Musculoskeletal: Positive for arthralgias and joint swelling.  All other systems reviewed and are negative.    Physical Exam Updated Vital Signs BP (!) 194/70 (BP Location: Right Arm)   Pulse 69   Temp (!) 97.4 F (36.3 C) (Oral)   Resp  18   Ht 5\' 7"  (1.702 m)   Wt 93 kg (205 lb)   SpO2 100%   BMI 32.11 kg/m   Physical Exam  Constitutional: He is oriented to person, place, and time. He appears well-developed and well-nourished.  HENT:  Head: Normocephalic and atraumatic.  Mouth/Throat: Oropharynx is clear and moist.  Eyes: Conjunctivae and EOM are normal. Pupils are equal, round, and reactive to light.  Neck: Normal range of motion.  Cardiovascular: Normal rate, regular rhythm and normal heart sounds.  Pulmonary/Chest: Effort normal and breath sounds normal. No stridor. No respiratory distress.  Abdominal: Soft. Bowel sounds are normal. There is no tenderness. There is no rebound.  Musculoskeletal: Normal range of motion.  Left knee w/effusion and diffusely tender; warm to the touch without overlying erythema or induration; is able to flex/extend the knee but  with some pain noted; normal distal perfusion; sensation intact  Neurological: He is alert and oriented to person, place, and time.  Skin: Skin is warm and dry.  Psychiatric: He has a normal mood and affect.  Nursing note and vitals reviewed.    ED Treatments / Results  Labs (all labs ordered are listed, but only abnormal results are displayed) Labs Reviewed - No data to display  EKG  EKG Interpretation None       Radiology Dg Knee Complete 4 Views Left  Result Date: 12/11/2017 CLINICAL DATA:  Left knee pain and swelling, 6 days duration. EXAM: LEFT KNEE - COMPLETE 4+ VIEW COMPARISON:  04/06/2009 FINDINGS: There is a knee joint effusion. No traumatic bone finding. Joint space heights are normal. Small marginal osteophytes. No focal bone finding. IMPRESSION: Joint effusion. Small marginal osteophytes with normal joint space height. Otherwise negative. Electronically Signed   By: Nelson Chimes M.D.   On: 12/11/2017 21:03    Procedures Procedures (including critical care time)  Medications Ordered in ED Medications  predniSONE (DELTASONE) tablet 60 mg (60 mg Oral Given 12/12/17 0330)  oxyCODONE-acetaminophen (PERCOCET/ROXICET) 5-325 MG per tablet 1 tablet (1 tablet Oral Given 12/12/17 0329)     Initial Impression / Assessment and Plan / ED Course  I have reviewed the triage vital signs and the nursing notes.  Pertinent labs & imaging results that were available during my care of the patient were reviewed by me and considered in my medical decision making (see chart for details).  57 year old male here with left knee pain.  Has history of gout.  Symptoms and exam findings today are consistent with such.  No findings of cellulitis.  He is afebrile and nontoxic.  Do not suspect septic joint.  Patient has very frequent gout flares, may benefit from being started on allopurinol in the future.  Does not currently have PCP, given information for wellness clinic to help establish care.  Will  treat acute flare with prednisone and pain medication.  He understands to return here for any new or worsening symptoms.  Final Clinical Impressions(s) / ED Diagnoses   Final diagnoses:  Acute pain of left knee    ED Discharge Orders        Ordered    oxyCODONE-acetaminophen (PERCOCET) 5-325 MG tablet  Every 4 hours PRN     12/12/17 0348    predniSONE (DELTASONE) 20 MG tablet     12/12/17 0348       Larene Pickett, PA-C 12/12/17 0404    Rolland Porter, MD 12/12/17 [phone]

## 2017-12-12 NOTE — Discharge Instructions (Signed)
Take the prescribed medication as directed. Follow-up with the wellness clinic-- call for appt. Return to the ED for new or worsening symptoms.

## 2018-02-27 DIAGNOSIS — M109 Gout, unspecified: Secondary | ICD-10-CM | POA: Insufficient documentation

## 2018-02-27 DIAGNOSIS — I10 Essential (primary) hypertension: Secondary | ICD-10-CM | POA: Insufficient documentation

## 2018-02-27 DIAGNOSIS — Z7982 Long term (current) use of aspirin: Secondary | ICD-10-CM | POA: Insufficient documentation

## 2018-02-28 ENCOUNTER — Other Ambulatory Visit: Payer: Self-pay

## 2018-02-28 ENCOUNTER — Emergency Department (HOSPITAL_COMMUNITY)
Admission: EM | Admit: 2018-02-28 | Discharge: 2018-02-28 | Disposition: A | Discharge status: Home / Self Care | Payer: Self-pay | Attending: Emergency Medicine | Admitting: Emergency Medicine

## 2018-02-28 ENCOUNTER — Emergency Department (HOSPITAL_COMMUNITY): Payer: Self-pay

## 2018-02-28 ENCOUNTER — Encounter (HOSPITAL_COMMUNITY): Payer: Self-pay

## 2018-02-28 DIAGNOSIS — M109 Gout, unspecified: Secondary | ICD-10-CM

## 2018-02-28 MED ORDER — OXYCODONE-ACETAMINOPHEN 5-325 MG PO TABS
1.0000 | ORAL_TABLET | ORAL | Status: DC | PRN
  Administered 2018-02-28: 1 via ORAL
  Filled 2018-02-28: qty 1

## 2018-02-28 MED ORDER — OXYCODONE HCL 5 MG PO TABS: 5.0000 mg | ORAL_TABLET | ORAL | 0 refills | Status: DC | PRN

## 2018-02-28 MED ORDER — INDOMETHACIN 25 MG PO CAPS: 25.0000 mg | ORAL_CAPSULE | Freq: Three times a day (TID) | ORAL | 0 refills | Status: DC | PRN

## 2018-02-28 MED ORDER — COLCHICINE 0.6 MG PO TABS: 0.6000 mg | ORAL_TABLET | ORAL | 0 refills | Status: DC

## 2018-02-28 NOTE — ED Provider Notes (Signed)
[facility] DEPT Provider Note   CSN: [id number] Arrival date & time: 02/27/18  2335     History   Chief Complaint Chief Complaint  Patient presents with  . Hand Pain    L    HPI [name] is a 57 y.o. male.  HPI   Presents with concern for left wrist pain. 10/10. Present for 2 days. Worse with movements. Has some pain in elbow as well. Both feel like prior gout flares.  Tried ibuprofen without relief.  NO fevers, no hx of septic arthritis, no hx of trauma.  Past Medical History:  Diagnosis Date  . Arthritis    "knees; left hand"  . Excessive drinking alcohol    Admitted to 2-3 40oz beers every other night 02/2012  . Heart murmur    Longstanding. 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 Hypertension   . Near syncope    05/2011 with tachypalpitations & with echo showing mild LVH, normal EF 55-60%, trivial AR    Patient Active Problem List   Diagnosis Date Noted  . Gout 03/21/2016  . Chest pain 02/18/2012  . Alcohol abuse 02/18/2012  . Syncope 05/28/2011  . Palpitations 05/28/2011  . Murmur 05/28/2011    Past Surgical History:  Procedure Laterality Date  . NO PAST SURGERIES          Home Medications    Prior to Admission medications   Medication Sig Start Date End Date Taking? Authorizing Provider  aspirin 325 MG tablet Take 325 mg by mouth daily.   Yes [provider]  Aspirin-Caffeine (BAYER BACK & BODY PAIN EX ST) 500-32.5 MG TABS Take 1 tablet by mouth every 8 (eight) hours as needed (for pain).   Yes [provider]  ibuprofen (ADVIL,MOTRIN) 200 MG tablet Take 400 mg by mouth every 6 (six) hours as needed.   Yes [provider]  colchicine 0.6 MG tablet Take 1 tablet (0.6 mg total) by mouth See admin instructions. Take 1.2mg  (2 tablets) followed by .6mg  (1 tablet) 1 hour later. 02/28/18   Gareth Morgan, MD  indomethacin (INDOCIN) 25 MG capsule Take 1 capsule (25 mg  total) by mouth 3 (three) times daily as needed. 02/28/18   Gareth Morgan, MD  oxyCODONE (ROXICODONE) 5 MG immediate release tablet Take 1 tablet (5 mg total) by mouth every 4 (four) hours as needed for severe pain. 02/28/18   Gareth Morgan, MD  oxyCODONE-acetaminophen (PERCOCET) 5-325 MG tablet Take 1 tablet by mouth every 4 (four) hours as needed. Patient not taking: Reported on 02/28/2018 12/12/17   Larene Pickett, PA-C  predniSONE (DELTASONE) 20 MG tablet Take 40 mg by mouth daily for 3 days, then 20mg  by mouth daily for 3 days, then 10mg  daily for 3 days Patient not taking: Reported on 02/28/2018 12/12/17   Larene Pickett, PA-C    Family History Family History  Problem Relation Age of Onset  . Cancer Mother        Possibly pancreatic. Died at age 36    Social History Social History   Tobacco Use  . Smoking status: Never Smoker  . Smokeless tobacco: Never Used  Substance Use Topics  . Alcohol use: Yes    Alcohol/week: 0.0 oz    Comment: states he has cut back/ every now and again  . Drug use: No     Allergies   Patient has no known allergies.   Review of Systems Review of Systems  Constitutional: Negative for fever.  Respiratory: Negative for cough and shortness of breath.   Cardiovascular: Negative for chest pain.  Gastrointestinal: Negative for vomiting.  Musculoskeletal: Positive for arthralgias.  Skin: Positive for color change. Negative for wound.  Neurological: Negative for syncope.     Physical Exam Updated Vital Signs BP (!) 152/83 (BP Location: Right Arm)   Pulse 64   Temp 98.3 F (36.8 C) (Oral)   Resp 16   Ht 5\' 7"  (1.702 m)   Wt 90.7 kg (200 lb)   SpO2 100%   BMI 31.32 kg/m   Physical Exam  Constitutional: He is oriented to person, place, and time. He appears well-developed and well-nourished. No distress.  HENT:  Head: Normocephalic and atraumatic.  Eyes: Conjunctivae and EOM are normal.  Neck: Normal range of motion.  Cardiovascular:  Normal rate, regular rhythm, normal heart sounds and intact distal pulses. Exam reveals no gallop and no friction rub.  No murmur heard. Pulmonary/Chest: Effort normal and breath sounds normal. No respiratory distress. He has no wheezes. He has no rales.  Abdominal: Soft. He exhibits no distension. There is no tenderness. There is no guarding.  Musculoskeletal:  Left wrist swelling, painful ROM Full ROM fingers Left wrist and left elbow tenderness, slight decreased ROM elbow No erythema or fluctuance 2+pulses   Neurological: He is alert and oriented to person, place, and time.  Skin: Skin is warm and dry. He is not diaphoretic.  Nursing note and vitals reviewed.    ED Treatments / Results  Labs (all labs ordered are listed, but only abnormal results are displayed) Labs Reviewed - No data to display  EKG None  Radiology Dg Hand Complete Left  Result Date: 02/28/2018 CLINICAL DATA:  Left hand and arm pain.  History of gout. EXAM: LEFT HAND - COMPLETE 3+ VIEW COMPARISON:  April 03, 2017 FINDINGS: No fractures or dislocations. Chronic degenerative changes at the fifth PIP joint. No bony erosions identified. No gouty tophi identified. No other acute abnormalities identified. IMPRESSION: Negative. Electronically Signed   By: Dorise Bullion III M.D   On: 02/28/2018 07:03    Procedures Procedures (including critical care time)  Medications Ordered in ED Medications - No data to display   Initial Impression / Assessment and Plan / ED Course  I have reviewed the triage vital signs and the nursing notes.  Pertinent labs & imaging results that were available during my care of the patient were reviewed by me and considered in my medical decision making (see chart for details).     57yo male with history above including gout presents with left wrist and elbow pain and swelling. Symptoms consistent with prior gout. No sign of acute arterial or venous thrombus. No hx of trauma.  Discussed  possibility of septic arthritis, however low suspicion for this given patient without fever, n oerythema, and with symptoms identical to prior gout.  Given colchicine, indomehtacin, and rx for oxycodone with discussion of risks and reasons to returh.   Final Clinical Impressions(s) / ED Diagnoses   Final diagnoses:  Acute gout of left hand, unspecified cause    ED Discharge Orders        Ordered    colchicine 0.6 MG tablet  See admin instructions     02/28/18 0719    indomethacin (INDOCIN) 25 MG capsule  3 times daily PRN     02/28/18 0719    oxyCODONE (ROXICODONE) 5 MG immediate release tablet  Every 4 hours PRN  02/28/18 1749       Gareth Morgan, MD 02/28/18 2243

## 2018-02-28 NOTE — Discharge Instructions (Signed)
Take Tylenol 1000 mg 4 times a day for 1 week. This is the maximum dose of Tylenol (acetaminophen) you can take from all sources. Please check other over-the-counter medications and prescriptions to ensure you are not taking other medications that contain acetaminophen.  In addition, take the prescribed indomethacin.  Take oxycodone as needed for breakthrough pain.  This medication can be addicting, sedating and cause constipation.

## 2018-02-28 NOTE — ED Notes (Addendum)
Patient has a notable swollen left hand that is darker than the right. Painful on palpation. Patient is able to marginally move all five fingers with no decreased sensation.

## 2018-02-28 NOTE — ED Triage Notes (Signed)
Pt reports L hand and arm pain. He has a hx of gout and states that it feels like gout again. His L hand is noticeably darker than the other hand. He states that that usually happens with his gout flare ups as well.

## 2018-02-28 NOTE — ED Notes (Signed)
Pt waiting at nurses station for discharge paperwork. Pt did not want vitals assessed prior to discharge.

## 2018-03-26 ENCOUNTER — Emergency Department (HOSPITAL_COMMUNITY)
Admission: EM | Admit: 2018-03-26 | Discharge: 2018-03-27 | Disposition: A | Discharge status: Home / Self Care | Payer: Self-pay | Attending: Emergency Medicine | Admitting: Emergency Medicine

## 2018-03-26 ENCOUNTER — Encounter (HOSPITAL_COMMUNITY): Payer: Self-pay | Admitting: Emergency Medicine

## 2018-03-26 ENCOUNTER — Other Ambulatory Visit: Payer: Self-pay

## 2018-03-26 DIAGNOSIS — Z7982 Long term (current) use of aspirin: Secondary | ICD-10-CM | POA: Insufficient documentation

## 2018-03-26 DIAGNOSIS — I10 Essential (primary) hypertension: Secondary | ICD-10-CM | POA: Insufficient documentation

## 2018-03-26 DIAGNOSIS — Z79899 Other long term (current) drug therapy: Secondary | ICD-10-CM | POA: Insufficient documentation

## 2018-03-26 DIAGNOSIS — M10042 Idiopathic gout, left hand: Secondary | ICD-10-CM | POA: Insufficient documentation

## 2018-03-26 NOTE — ED Triage Notes (Signed)
Pt is c/o right hand pain and swelling that started 4 days ago  Pt denies injury

## 2018-03-27 MED ORDER — PREDNISONE 20 MG PO TABS: ORAL_TABLET | ORAL | 0 refills | Status: DC

## 2018-03-27 MED ORDER — OXYCODONE-ACETAMINOPHEN 5-325 MG PO TABS
1.0000 | ORAL_TABLET | Freq: Once | ORAL | Status: AC
  Administered 2018-03-27: 1 via ORAL
  Filled 2018-03-27: qty 1

## 2018-03-27 MED ORDER — COLCHICINE 0.6 MG PO TABS: 0.6000 mg | ORAL_TABLET | ORAL | 0 refills | Status: DC

## 2018-03-27 MED ORDER — OXYCODONE HCL 5 MG PO TABS: 5.0000 mg | ORAL_TABLET | ORAL | 0 refills | Status: DC | PRN

## 2018-03-27 NOTE — ED Provider Notes (Signed)
[facility] DEPT Provider Note: Georgena Spurling, MD, FACEP  CSN: [id number] MRN: [id number] ARRIVAL: 03/26/18 at [place]: [facility]   CHIEF COMPLAINT  Hand Pain   HISTORY OF PRESENT ILLNESS  03/27/18 3:11 AM Haiden Rawlinson is a 57 y.o. male with a history of gout.  He is here with a 4-day history of pain and swelling in his left index finger, primarily at the MCP joint, consistent with previous episodes of gout. He rates his pain is on 11 out of 10.  Pain is worse with palpation or attempted movement.  Range of motion of that finger is limited.  He denies other complaint.   Past Medical History:  Diagnosis Date  . Arthritis    "knees; left hand"  . Excessive drinking alcohol    Admitted to 2-3 40oz beers every other night 02/2012  . Heart murmur    Longstanding. 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 Hypertension   . Near syncope    05/2011 with tachypalpitations & with echo showing mild LVH, normal EF 55-60%, trivial AR    Past Surgical History:  Procedure Laterality Date  . NO PAST SURGERIES      Family History  Problem Relation Age of Onset  . Cancer Mother        Possibly pancreatic. Died at age 75    Social History   Tobacco Use  . Smoking status: Never Smoker  . Smokeless tobacco: Never Used  Substance Use Topics  . Alcohol use: Not Currently    Alcohol/week: 0.0 oz    Comment: states he has cut back/ every now and again  . Drug use: No    Prior to Admission medications   Medication Sig Start Date End Date Taking? Authorizing Provider  aspirin 325 MG tablet Take 325 mg by mouth daily.    [provider]  Aspirin-Caffeine (BAYER BACK & BODY PAIN EX ST) 500-32.5 MG TABS Take 1 tablet by mouth every 8 (eight) hours as needed (for pain).    [provider]  colchicine 0.6 MG tablet Take 1 tablet (0.6 mg total) by mouth See admin instructions. Take 1.2mg  (2 tablets) followed by .6mg  (1 tablet) 1 hour later.  02/28/18   Gareth Morgan, MD  ibuprofen (ADVIL,MOTRIN) 200 MG tablet Take 400 mg by mouth every 6 (six) hours as needed.    [provider]  indomethacin (INDOCIN) 25 MG capsule Take 1 capsule (25 mg total) by mouth 3 (three) times daily as needed. 02/28/18   Gareth Morgan, MD  oxyCODONE (ROXICODONE) 5 MG immediate release tablet Take 1 tablet (5 mg total) by mouth every 4 (four) hours as needed for severe pain. 02/28/18   Gareth Morgan, MD  oxyCODONE-acetaminophen (PERCOCET) 5-325 MG tablet Take 1 tablet by mouth every 4 (four) hours as needed. Patient not taking: Reported on 02/28/2018 12/12/17   Larene Pickett, PA-C  predniSONE (DELTASONE) 20 MG tablet Take 40 mg by mouth daily for 3 days, then 20mg  by mouth daily for 3 days, then 10mg  daily for 3 days Patient not taking: Reported on 02/28/2018 12/12/17   Larene Pickett, PA-C    Allergies Patient has no known allergies.   REVIEW OF SYSTEMS  Negative except as noted here or in the History of Present Illness.   PHYSICAL EXAMINATION  Initial Vital Signs Blood pressure (!) 153/75, pulse 70, temperature 98.5 F (36.9 C), temperature source Oral, resp. rate 18, SpO2 99 %.  Examination General: Well-developed,  well-nourished male in no acute distress; appearance consistent with age of record HENT: normocephalic; atraumatic Eyes: Normal appearance Neck: supple Heart: regular rate and rhythm Lungs: clear to auscultation bilaterally Abdomen: soft; nondistended Extremities: No deformity; tenderness and swelling of left index finger without erythema or warmth, decreased range of motion of the left index finger especially at the MCP joint Neurologic: Awake, alert and oriented; motor function intact in all extremities and symmetric; no facial droop Skin: Warm and dry Psychiatric: Normal mood and affect   RESULTS  Summary of this visit's results, reviewed by myself:   EKG Interpretation  Date/Time:    Ventricular Rate:      PR Interval:    QRS Duration:   QT Interval:    QTC Calculation:   R Axis:     Text Interpretation:        Laboratory Studies: No results found for this or any previous visit (from the past 24 hour(s)). Imaging Studies: No results found.  ED COURSE and MDM  Nursing notes and initial vitals signs, including pulse oximetry, reviewed.  Vitals:   03/26/18 2331  BP: (!) 153/75  Pulse: 70  Resp: 18  Temp: 98.5 F (36.9 C)  TempSrc: Oral  SpO2: 99%   History and examination consistent with gout.  Patient states that steroids seem to work best for him.  We will also give a course of colchicine as he is gotten benefit from this in the past.  Consultation with the [place] state controlled substances database reveals the patient has received 3 prescriptions for oxycodone in the past 2 years.   PROCEDURES    ED DIAGNOSES     ICD-10-CM   1. Acute idiopathic gout of left hand M10.042        Jakeya Gherardi, Jenny Reichmann, MD 03/27/18 [phone]

## 2018-10-01 ENCOUNTER — Other Ambulatory Visit: Payer: Self-pay

## 2018-10-01 ENCOUNTER — Emergency Department (HOSPITAL_COMMUNITY)
Admission: EM | Admit: 2018-10-01 | Discharge: 2018-10-01 | Disposition: A | Discharge status: Home / Self Care | Payer: Medicaid Other | Attending: Emergency Medicine | Admitting: Emergency Medicine

## 2018-10-01 ENCOUNTER — Emergency Department (HOSPITAL_COMMUNITY): Payer: Medicaid Other

## 2018-10-01 ENCOUNTER — Encounter (HOSPITAL_COMMUNITY): Payer: Self-pay

## 2018-10-01 DIAGNOSIS — N2889 Other specified disorders of kidney and ureter: Secondary | ICD-10-CM | POA: Insufficient documentation

## 2018-10-01 DIAGNOSIS — R160 Hepatomegaly, not elsewhere classified: Secondary | ICD-10-CM | POA: Insufficient documentation

## 2018-10-01 DIAGNOSIS — R1011 Right upper quadrant pain: Secondary | ICD-10-CM | POA: Diagnosis present

## 2018-10-01 DIAGNOSIS — I10 Essential (primary) hypertension: Secondary | ICD-10-CM | POA: Diagnosis not present

## 2018-10-01 HISTORY — DX: Gout, unspecified: M10.9

## 2018-10-01 LAB — CBC
HCT: 41.5 % (ref 39.0–52.0)
Hemoglobin: 13.3 g/dL (ref 13.0–17.0)
MCH: 27.5 pg (ref 26.0–34.0)
MCHC: 32 g/dL (ref 30.0–36.0)
MCV: 85.7 fL (ref 80.0–100.0)
Platelets: 186 10*3/uL (ref 150–400)
RBC: 4.84 MIL/uL (ref 4.22–5.81)
RDW: 15.1 % (ref 11.5–15.5)
WBC: 8.8 10*3/uL (ref 4.0–10.5)
nRBC: 0 % (ref 0.0–0.2)

## 2018-10-01 LAB — URINALYSIS, ROUTINE W REFLEX MICROSCOPIC
Bilirubin Urine: NEGATIVE
Glucose, UA: NEGATIVE mg/dL
Hgb urine dipstick: NEGATIVE
Ketones, ur: NEGATIVE mg/dL
Leukocytes, UA: NEGATIVE
Nitrite: NEGATIVE
Protein, ur: NEGATIVE mg/dL
Specific Gravity, Urine: 1.017 (ref 1.005–1.030)
pH: 5 (ref 5.0–8.0)

## 2018-10-01 LAB — LIPASE, BLOOD: LIPASE: 29 U/L (ref 11–51)

## 2018-10-01 LAB — COMPREHENSIVE METABOLIC PANEL
ALT: 71 U/L — AB (ref 0–44)
AST: 90 U/L — ABNORMAL HIGH (ref 15–41)
Albumin: 3.2 g/dL — ABNORMAL LOW (ref 3.5–5.0)
Alkaline Phosphatase: 327 U/L — ABNORMAL HIGH (ref 38–126)
Anion gap: 11 (ref 5–15)
BUN: 21 mg/dL — ABNORMAL HIGH (ref 6–20)
CO2: 21 mmol/L — ABNORMAL LOW (ref 22–32)
Calcium: 9.4 mg/dL (ref 8.9–10.3)
Chloride: 101 mmol/L (ref 98–111)
Creatinine, Ser: 1.1 mg/dL (ref 0.61–1.24)
GFR calc Af Amer: >60 mL/min (ref >60)
GFR calc non Af Amer: >60 mL/min (ref >60)
Glucose, Bld: 96 mg/dL (ref 70–99)
Potassium: 4.6 mmol/L (ref 3.5–5.1)
Sodium: 133 mmol/L — ABNORMAL LOW (ref 135–145)
Total Bilirubin: 3.4 mg/dL — ABNORMAL HIGH (ref 0.3–1.2)
Total Protein: 7.6 g/dL (ref 6.5–8.1)

## 2018-10-01 LAB — TROPONIN I: Troponin I: <0.03 ng/mL (ref <0.03)

## 2018-10-01 LAB — PROTIME-INR
INR: 1.11
Prothrombin Time: 14.2 seconds (ref 11.4–15.2)

## 2018-10-01 MED ORDER — MORPHINE SULFATE (PF) 4 MG/ML IV SOLN: 4.0000 mg | INTRAVENOUS | Status: DC | PRN

## 2018-10-01 MED ORDER — SODIUM CHLORIDE (PF) 0.9 % IJ SOLN
INTRAMUSCULAR | Status: AC
  Filled 2018-10-01: qty 50

## 2018-10-01 MED ORDER — ONDANSETRON 4 MG PO TBDP: 4.0000 mg | ORAL_TABLET | Freq: Three times a day (TID) | ORAL | 0 refills | Status: DC | PRN

## 2018-10-01 MED ORDER — ONDANSETRON HCL 4 MG/2ML IJ SOLN: 4.0000 mg | INTRAMUSCULAR | Status: DC | PRN

## 2018-10-01 MED ORDER — OXYCODONE HCL 5 MG PO TABS
5.0000 mg | ORAL_TABLET | Freq: Once | ORAL | Status: AC
  Administered 2018-10-01: 5 mg via ORAL
  Filled 2018-10-01: qty 1

## 2018-10-01 MED ORDER — OXYCODONE HCL 5 MG PO TABS: 5.0000 mg | ORAL_TABLET | Freq: Four times a day (QID) | ORAL | 0 refills | Status: AC | PRN

## 2018-10-01 MED ORDER — IOPAMIDOL (ISOVUE-300) INJECTION 61%
100.0000 mL | Freq: Once | INTRAVENOUS | Status: AC | PRN
  Administered 2018-10-01: 100 mL via INTRAVENOUS

## 2018-10-01 NOTE — Discharge Instructions (Addendum)
Your CT scan showed findings:  "Interval development of numerous ill-defined hypodense masses throughout the entire liver. There is a 2.5 cm enhancing mass over the upper pole cortex of the right kidney. 1 cm hypodensity over the pancreatic tail not well seen on previous noncontrast exams. MR may be helpful for further evaluation."  Your liver enzymes were elevated today. Avoid alcohol, tylenol, and tylenol containing products. Take the prescriptions as directed.  Call the Oncologist on Monday to schedule a follow up appointment next week for your CT scan findings.  Return to the Emergency Department immediately sooner if worsening.

## 2018-10-01 NOTE — ED Triage Notes (Signed)
Patient c/o RUQ pain and weight loss x 1 month. Patient has lost approx 40 lbs in one month. Patient states he has nausea at times.

## 2018-10-01 NOTE — ED Provider Notes (Signed)
[facility] DEPT Provider Note   CSN: [id number] Arrival date & time: 10/01/18  1522     History   Chief Complaint Chief Complaint  Patient presents with  . Weight Loss  . Abdominal Pain    HPI [name] is a 57 y.o. male.  HPI Pt was seen at 1905.  Per pt, c/o gradual onset and persistence of constant RUQ abd "pain" for the past 1 month. Has been associated with nausea and approximately 40 pound weight loss. Describes the abd pain as "aching." Describes his stools as "dark" and "grey."  Denies diarrhea, no fevers, no back pain, no rash, no CP/SOB, no black or blood in stools.      Past Medical History:  Diagnosis Date  . Arthritis    "knees; left hand"  . Excessive drinking alcohol    Admitted to 2-3 40oz beers every other night 02/2012  . Gout   . Heart murmur    Longstanding. 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 Hypertension   . Near syncope    05/2011 with tachypalpitations & with echo showing mild LVH, normal EF 55-60%, trivial AR    Patient Active Problem List   Diagnosis Date Noted  . Gout 03/21/2016  . Chest pain 02/18/2012  . Alcohol abuse 02/18/2012  . Syncope 05/28/2011  . Palpitations 05/28/2011  . Murmur 05/28/2011    Past Surgical History:  Procedure Laterality Date  . NO PAST SURGERIES          Home Medications    Prior to Admission medications   Medication Sig Start Date End Date Taking? Authorizing Provider  aspirin 325 MG tablet Take 325 mg by mouth daily.    [provider]  colchicine 0.6 MG tablet Take 1 tablet (0.6 mg total) by mouth See admin instructions. Take 1.2mg  (2 tablets) followed by .6mg  (1 tablet) 1 hour later. 03/27/18   Molpus, John, MD  oxyCODONE (ROXICODONE) 5 MG immediate release tablet Take 1 tablet (5 mg total) by mouth every 4 (four) hours as needed for severe pain. 03/27/18   Molpus, Jenny Reichmann, MD  predniSONE (DELTASONE) 20 MG tablet Take 60 mg (three  tablets) by mouth daily for 2 days, then 40mg  (two tablets) by mouth daily for 2 days, then 20mg  (one tablets) daily for 2 days 03/27/18   Molpus, Jenny Reichmann, MD    Family History Family History  Problem Relation Age of Onset  . Cancer Mother        Possibly pancreatic. Died at age 64    Social History Social History   Tobacco Use  . Smoking status: Never Smoker  . Smokeless tobacco: Never Used  Substance Use Topics  . Alcohol use: Not Currently    Comment: states he has cut back/ every now and again  . Drug use: No     Allergies   Patient has no known allergies.   Review of Systems Review of Systems ROS: Statement: All systems negative except as marked or noted in the HPI; Constitutional: Negative for fever and chills. ; ; Eyes: Negative for eye pain, redness and discharge. ; ; ENMT: Negative for ear pain, hoarseness, nasal congestion, sinus pressure and sore throat. ; ; Cardiovascular: Negative for chest pain, palpitations, diaphoresis, dyspnea and peripheral edema. ; ; Respiratory: Negative for cough, wheezing and stridor. ; ; Gastrointestinal: +nausea, abd pain, weight loss. Negative for vomiting, diarrhea, blood in stool, hematemesis, jaundice and rectal bleeding. . ; ; Genitourinary:  Negative for dysuria, flank pain and hematuria. ; ; Musculoskeletal: Negative for back pain and neck pain. Negative for swelling and trauma.; ; Skin: Negative for pruritus, rash, abrasions, blisters, bruising and skin lesion.; ; Neuro: Negative for headache, lightheadedness and neck stiffness. Negative for weakness, altered level of consciousness, altered mental status, extremity weakness, paresthesias, involuntary movement, seizure and syncope.       Physical Exam Updated Vital Signs BP (!) 147/77 (BP Location: Left Arm)   Pulse 88   Temp 98.8 F (37.1 C) (Oral)   Resp 16   Ht 5\' 7"  (1.702 m)   Wt 75.8 kg   SpO2 98%   BMI 26.19 kg/m   Physical Exam 1910: Physical examination:  Nursing  notes reviewed; Vital signs and O2 SAT reviewed;  Constitutional: Well developed, Well nourished, Well hydrated, In no acute distress; Head:  Normocephalic, atraumatic; Eyes: EOMI, PERRL, +scleral icterus; ENMT: Mouth and pharynx normal, Mucous membranes moist; Neck: Supple, Full range of motion, No lymphadenopathy; Cardiovascular: Regular rate and rhythm, No gallop; Respiratory: Breath sounds clear & equal bilaterally, No wheezes.  Speaking full sentences with ease, Normal respiratory effort/excursion; Chest: Nontender, Movement normal; Abdomen:  Nontender, +softly mildly distended, Normal bowel sounds; Genitourinary: No CVA tenderness; Extremities: Peripheral pulses normal, No tenderness, No edema, No calf edema or asymmetry.; Neuro: AA&Ox3, Major CN grossly intact.  Speech clear. No gross focal motor or sensory deficits in extremities.; Skin: Color normal, Warm, Dry.   ED Treatments / Results  Labs (all labs ordered are listed, but only abnormal results are displayed)   EKG None  Radiology   Procedures Procedures (including critical care time)  Medications Ordered in ED Medications - No data to display   Initial Impression / Assessment and Plan / ED Course  I have reviewed the triage vital signs and the nursing notes.  Pertinent labs & imaging results that were available during my care of the patient were reviewed by me and considered in my medical decision making (see chart for details).  MDM Reviewed: previous chart, nursing note and vitals Reviewed previous: labs and ECG Interpretation: labs, ECG, x-ray and CT scan    Results for orders placed or performed during the hospital encounter of 10/01/18  Lipase, blood  Result Value Ref Range   Lipase 29 11 - 51 U/L  Comprehensive metabolic panel  Result Value Ref Range   Sodium 133 (L) 135 - 145 mmol/L   Potassium 4.6 3.5 - 5.1 mmol/L   Chloride 101 98 - 111 mmol/L   CO2 21 (L) 22 - 32 mmol/L   Glucose, Bld 96 70 - 99 mg/dL    BUN 21 (H) 6 - 20 mg/dL   Creatinine, Ser 1.10 0.61 - 1.24 mg/dL   Calcium 9.4 8.9 - 10.3 mg/dL   Total Protein 7.6 6.5 - 8.1 g/dL   Albumin 3.2 (L) 3.5 - 5.0 g/dL   AST 90 (H) 15 - 41 U/L   ALT 71 (H) 0 - 44 U/L   Alkaline Phosphatase 327 (H) 38 - 126 U/L   Total Bilirubin 3.4 (H) 0.3 - 1.2 mg/dL   GFR calc non Af Amer >60 >60 mL/min   GFR calc Af Amer >60 >60 mL/min   Anion gap 11 5 - 15  CBC  Result Value Ref Range   WBC 8.8 4.0 - 10.5 K/uL   RBC 4.84 4.22 - 5.81 MIL/uL   Hemoglobin 13.3 13.0 - 17.0 g/dL   HCT 41.5 39.0 - 52.0 %  MCV 85.7 80.0 - 100.0 fL   MCH 27.5 26.0 - 34.0 pg   MCHC 32.0 30.0 - 36.0 g/dL   RDW 15.1 11.5 - 15.5 %   Platelets 186 150 - 400 K/uL   nRBC 0.0 0.0 - 0.2 %  Urinalysis, Routine w reflex microscopic  Result Value Ref Range   Color, Urine AMBER (A) YELLOW   APPearance CLEAR CLEAR   Specific Gravity, Urine 1.017 1.005 - 1.030   pH 5.0 5.0 - 8.0   Glucose, UA NEGATIVE NEGATIVE mg/dL   Hgb urine dipstick NEGATIVE NEGATIVE   Bilirubin Urine NEGATIVE NEGATIVE   Ketones, ur NEGATIVE NEGATIVE mg/dL   Protein, ur NEGATIVE NEGATIVE mg/dL   Nitrite NEGATIVE NEGATIVE   Leukocytes, UA NEGATIVE NEGATIVE  Troponin I - Once  Result Value Ref Range   Troponin I <0.03 <0.03 ng/mL  Protime-INR  Result Value Ref Range   Prothrombin Time 14.2 11.4 - 15.2 seconds   INR 1.11    Dg Chest 2 View Result Date: 10/01/2018 CLINICAL DATA:  Patient c/o RUQ pain and weight loss x 1 month. Patient has lost approx 40 lbs in one month. Patient states he has nausea at times. Both images taken on full inspiration. Hx HTN EXAM: CHEST - 2 VIEW COMPARISON:  02/17/2017 FINDINGS: The heart size and mediastinal contours are within normal limits. Both lungs are clear. No pleural effusion or pneumothorax. The visualized skeletal structures are unremarkable. IMPRESSION: No active cardiopulmonary disease. Electronically Signed   By: Lajean Manes M.D.   On: 10/01/2018 19:28   Ct  Abdomen Pelvis W Contrast Result Date: 10/01/2018 CLINICAL DATA:  Right upper quadrant pain and 40 pound weight loss 1 month. Intermittent nausea. EXAM: CT ABDOMEN AND PELVIS WITH CONTRAST TECHNIQUE: Multidetector CT imaging of the abdomen and pelvis was performed using the standard protocol following bolus administration of intravenous contrast. CONTRAST:  139mL ISOVUE-300 IOPAMIDOL (ISOVUE-300) INJECTION 61% COMPARISON:  02/18/2017 and 07/10/2011 FINDINGS: Lower chest: Lung bases are normal. Tiny amount of pericardial fluid. Hepatobiliary: Stable 2.1 cm right liver cyst. Interval development of numerous ill-defined hypodense liver masses throughout the entire liver. Largest definable mass over the lateral segment of the left lobe measuring 8.5 cm. Narrowing of the right portal vein. Gallbladder and biliary tree are normal. Pancreas: 1 cm hypodensity over the pancreatic tail not well seen on the previous noncontrast exams. Spleen: Several calcified splenic granulomas are present. Adrenals/Urinary Tract: Adrenal glands are normal. Kidneys are normal in size without hydronephrosis or nephrolithiasis. There is a 2.5 cm solid slightly enhancing mass over the superior pole of the right kidney concerning for renal cell carcinoma. Ureters and bladder are normal. Stomach/Bowel: Stomach and small bowel are normal. Appendix is normal. Mild fecal retention throughout the colon. Vascular/Lymphatic: Minimal calcified plaque over the abdominal aorta. No definite adenopathy. Reproductive: Normal. Other: Small amount of free fluid in the pelvis. Musculoskeletal: Unremarkable. IMPRESSION: 1. Interval development of numerous ill-defined hypodense masses throughout the entire liver compatible with metastatic disease. There is a 2.5 cm enhancing mass over the upper pole cortex of the right kidney suspicious for renal cell carcinoma. 2.  No acute findings in the abdomen/pelvis. 3. 1 cm hypodensity over the pancreatic tail not well  seen on previous noncontrast exams. MR may be helpful for further evaluation on an elective basis. Electronically Signed   By: Marin Olp M.D.   On: 10/01/2018 21:54     Results for TODRICK, SIEDSCHLAG (MRN [id number]) as of 10/01/2018 19:22  Ref.  Range 02/17/2012 21:44 02/18/2012 06:00 10/01/2018 16:56  AST Latest Ref Range: 15 - 41 U/L 15 12 90 (H)  ALT Latest Ref Range: 0 - 44 U/L 16 14 71 (H)  Total Bilirubin Latest Ref Range: 0.3 - 1.2 mg/dL 0.2 (L) 0.3 3.4 (H)     2220:  Pt has tol PO well while in the ED without N/V. CT as above. T/C returned from Oncology Dr. Irene Limbo, case discussed, including:  HPI, pertinent PM/SHx, VS/PE, dx testing, ED course and treatment:  No clear indication for admission if pt is not requiring IVF, IV antiemetic/pain meds; however, if pt does not feel he can f/u with [facility] as outpt and be lost to follow up, he will need hospital admit to obtain workup; if pt feels he can f/u in [facility], he can call Clinic next week to be set up with f/u appointment for outpatient workup. Dx and testing, as well as d/w Onc MD, d/w pt and family.  Questions answered.  Verb understanding. Pt absolutely does not want to be admitted and wants to go home. He is agreeable to call [facility] on Monday to obtain f/u appointment for further CA workup.         Final Clinical Impressions(s) / ED Diagnoses   Final diagnoses:  None    ED Discharge Orders    None       Francine Graven, DO 10/04/18 0800

## 2018-10-04 ENCOUNTER — Telehealth: Payer: Self-pay | Admitting: *Deleted

## 2018-10-04 ENCOUNTER — Other Ambulatory Visit: Payer: Self-pay | Admitting: *Deleted

## 2018-10-04 ENCOUNTER — Telehealth: Payer: Self-pay | Admitting: Hematology

## 2018-10-04 DIAGNOSIS — R1011 Right upper quadrant pain: Secondary | ICD-10-CM

## 2018-10-04 NOTE — Telephone Encounter (Signed)
Patient/spouse called to verify time of appt on Wednesday. Gave appt times, they verbalized understanding.

## 2018-10-04 NOTE — Telephone Encounter (Signed)
Scheduled appt per 12/2 sch message - left message for patient with appt date and time.

## 2018-10-06 ENCOUNTER — Inpatient Hospital Stay: Payer: Medicaid Other | Attending: Hematology | Admitting: Hematology

## 2018-10-06 ENCOUNTER — Inpatient Hospital Stay: Payer: Medicaid Other

## 2018-10-06 ENCOUNTER — Telehealth: Payer: Self-pay | Admitting: Hematology

## 2018-10-06 ENCOUNTER — Encounter: Payer: Self-pay | Admitting: Hematology

## 2018-10-06 VITALS — BP 138/70 | HR 80 | Temp 98.1°F | Resp 18 | Ht 67.0 in | Wt 161.8 lb

## 2018-10-06 DIAGNOSIS — C787 Secondary malignant neoplasm of liver and intrahepatic bile duct: Secondary | ICD-10-CM

## 2018-10-06 DIAGNOSIS — C7B8 Other secondary neuroendocrine tumors: Secondary | ICD-10-CM | POA: Diagnosis not present

## 2018-10-06 DIAGNOSIS — N2889 Other specified disorders of kidney and ureter: Secondary | ICD-10-CM

## 2018-10-06 DIAGNOSIS — R945 Abnormal results of liver function studies: Secondary | ICD-10-CM | POA: Insufficient documentation

## 2018-10-06 DIAGNOSIS — C7A1 Malignant poorly differentiated neuroendocrine tumors: Secondary | ICD-10-CM | POA: Insufficient documentation

## 2018-10-06 DIAGNOSIS — R160 Hepatomegaly, not elsewhere classified: Secondary | ICD-10-CM

## 2018-10-06 DIAGNOSIS — R1011 Right upper quadrant pain: Secondary | ICD-10-CM

## 2018-10-06 DIAGNOSIS — C801 Malignant (primary) neoplasm, unspecified: Secondary | ICD-10-CM

## 2018-10-06 LAB — CMP (CANCER CENTER ONLY)
ALT: 63 U/L — AB (ref 0–44)
AST: 77 U/L — ABNORMAL HIGH (ref 15–41)
Albumin: 3 g/dL — ABNORMAL LOW (ref 3.5–5.0)
Alkaline Phosphatase: 392 U/L — ABNORMAL HIGH (ref 38–126)
Anion gap: 11 (ref 5–15)
BUN: 13 mg/dL (ref 6–20)
CO2: 22 mmol/L (ref 22–32)
CREATININE: 0.89 mg/dL (ref 0.61–1.24)
Calcium: 9.6 mg/dL (ref 8.9–10.3)
Chloride: 101 mmol/L (ref 98–111)
GFR, Est AFR Am: >60 mL/min (ref >60)
GFR, Estimated: >60 mL/min (ref >60)
Glucose, Bld: 96 mg/dL (ref 70–99)
Potassium: 4.6 mmol/L (ref 3.5–5.1)
Sodium: 134 mmol/L — ABNORMAL LOW (ref 135–145)
Total Bilirubin: 2.3 mg/dL — ABNORMAL HIGH (ref 0.3–1.2)
Total Protein: 7.6 g/dL (ref 6.5–8.1)

## 2018-10-06 LAB — CBC WITH DIFFERENTIAL (CANCER CENTER ONLY)
Abs Immature Granulocytes: 0.05 10*3/uL (ref 0.00–0.07)
Basophils Absolute: 0.1 10*3/uL (ref 0.0–0.1)
Basophils Relative: 1 %
EOS PCT: 3 %
Eosinophils Absolute: 0.2 10*3/uL (ref 0.0–0.5)
HCT: 40.4 % (ref 39.0–52.0)
Hemoglobin: 13.2 g/dL (ref 13.0–17.0)
Immature Granulocytes: 1 %
Lymphocytes Relative: 18 %
Lymphs Abs: 1.2 10*3/uL (ref 0.7–4.0)
MCH: 27.4 pg (ref 26.0–34.0)
MCHC: 32.7 g/dL (ref 30.0–36.0)
MCV: 84 fL (ref 80.0–100.0)
Monocytes Absolute: 1.2 10*3/uL — ABNORMAL HIGH (ref 0.1–1.0)
Monocytes Relative: 17 %
Neutro Abs: 4.2 10*3/uL (ref 1.7–7.7)
Neutrophils Relative %: 60 %
Platelet Count: 156 10*3/uL (ref 150–400)
RBC: 4.81 MIL/uL (ref 4.22–5.81)
RDW: 14.6 % (ref 11.5–15.5)
WBC Count: 6.9 10*3/uL (ref 4.0–10.5)
nRBC: 0 % (ref 0.0–0.2)

## 2018-10-06 LAB — LACTATE DEHYDROGENASE: LDH: 215 U/L — ABNORMAL HIGH (ref 98–192)

## 2018-10-06 NOTE — Progress Notes (Signed)
HEMATOLOGY/ONCOLOGY CONSULTATION NOTE  Date of Service: 10/06/2018  Patient Care Team: Patient, No Pcp Per as PCP - General (General Practice)  CHIEF COMPLAINTS/PURPOSE OF CONSULTATION:  Liver masses   HISTORY OF PRESENTING ILLNESS:   [name] is a wonderful 57 y.o. male who has been referred to [place] by Dr. Francine Graven for evaluation and management of Liver masses. He is accompanied today by his wife and aunt. The pt reports that he is doing well overall.   The pt presented to the ED on 10/01/18 regarding persistent RUQ abdominal pain which began a month prior, and noted associations with nausea and 40 pound weight loss. He was evaluated with a CT A/P, as noted below, which revealed liver masses.   The pt reports that he first began feeling RUQ abdominal pain about 3-4 weeks ago, but began losing weight about 2 months ago. He notes that he began feeling more fatigue about 2 months ago as well.  The pt notes that the color of his bowels became dark black and denies taking Iron pills or bismuth medications. He notes that he began feeling constipated as well, feeling backed up. He notes that his stools began looking thinner in caliber. The pt notes that his appetite began decreasing over the last 3-4 weeks ago, and some of this he attributes to intermittent nausea. He denies problems swallowing food. He endorses RUQ pain, and denies vomiting and new cough, new bone pains, testicular pain or swelling, problems passing urine, blood in the urine. The pt has never had a colonoscopy and does not have a PCP, and cites his lack of health insurance as the reason.  The pt was released from the ED with Oxycodone and has been using this to treat his RUQ pain successfully.   The pt notes gout and a heart murmur as previous medical history. He notes that he hasn't consumed alcohol in the past 2 years whatsoever, but prior to this alcohol was a problem for him.   Of note prior to the patient's  visit today, pt has had a CT A/P completed on 10/01/18 with results revealing Interval development of numerous ill-defined hypodense masses throughout the entire liver compatible with metastatic disease. There is a 2.5 cm enhancing mass over the upper pole cortex of the right kidney suspicious for renal cell carcinoma. 2.  No acute findings in the abdomen/pelvis. 3. 1 cm hypodensity over the pancreatic tail not well seen on previous noncontrast exams. MR may be helpful for further evaluation on an elective basis.   Most recent lab results (10/06/18) of CBC w/diff and CMP is as follows: all values are WNL except for Monocytes abs at 1.2k, Sodium at 134, Albumin at 3.0, AST at 77, ALT at 63, Alk Phos at 392, Total Bilirubin at 2.3   On review of systems, pt reports new constipation, smaller caliber stools, dark stools, unexpected weight loss, RUQ pain, new fatigue, and denies vomiting, cough, new SOB, changes in breathing, new bone pains, leg swelling, blood in the urine, changes in urination, fevers, pain along the spine, and any other symptoms.   On PMHx the pt reports Gout, heart murmur, previous alcohol abuse. He denies any surgeries.  On Social Hx the pt reports that he hasn't consumed alcohol in the past 2 years whatsoever. He notes that alcohol abuse was previously a concern. He denies ever smoking cigarettes or doing drugs. He denies any radiation or chemical exposure.  On Family Hx the pt reports mother with  pancreatic cancer in 14s, maternal aunts with high blood pressure and heart failure, maternal aunt with breast cancer.   MEDICAL HISTORY:  Past Medical History:  Diagnosis Date  . Arthritis    "knees; left hand"  . Excessive drinking alcohol    Admitted to 2-3 40oz beers every other night 02/2012  . Gout   . Heart murmur    Longstanding. 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 Hypertension   . Near syncope    05/2011 with tachypalpitations & with echo showing mild  LVH, normal EF 55-60%, trivial AR    SURGICAL HISTORY: Past Surgical History:  Procedure Laterality Date  . NO PAST SURGERIES      SOCIAL HISTORY: Social History   Socioeconomic History  . Marital status: Married    Spouse name: Not on file  . Number of children: Not on file  . Years of education: Not on file  . Highest education level: Not on file  Occupational History  . Not on file  Social Needs  . Financial resource strain: Not on file  . Food insecurity:    Worry: Not on file    Inability: Not on file  . Transportation needs:    Medical: Not on file    Non-medical: Not on file  Tobacco Use  . Smoking status: Never Smoker  . Smokeless tobacco: Never Used  Substance and Sexual Activity  . Alcohol use: Not Currently    Comment: states he has cut back/ every now and again  . Drug use: No  . Sexual activity: Yes  Lifestyle  . Physical activity:    Days per week: Not on file    Minutes per session: Not on file  . Stress: Not on file  Relationships  . Social connections:    Talks on phone: Not on file    Gets together: Not on file    Attends religious service: Not on file    Active member of club or organization: Not on file    Attends meetings of clubs or organizations: Not on file    Relationship status: Not on file  . Intimate partner violence:    Fear of current or ex partner: Not on file    Emotionally abused: Not on file    Physically abused: Not on file    Forced sexual activity: Not on file  Other Topics Concern  . Not on file  Social History Narrative   Married. 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 Is from [place].     FAMILY HISTORY: Family History  Problem Relation Age of Onset  . Cancer Mother        Possibly pancreatic. Died at age 76    ALLERGIES:  has No Known Allergies.  MEDICATIONS:  Current Outpatient Medications  Medication Sig Dispense Refill  . oxyCODONE (OXY IR/ROXICODONE) 5 MG  immediate release tablet Take 5 mg by mouth every 4 (four) hours as needed for severe pain.    Marland Kitchen aspirin 325 MG tablet Take 325 mg by mouth daily.    . colchicine 0.6 MG tablet Take 1 tablet (0.6 mg total) by mouth See admin instructions. Take 1.49m (2 tablets) followed by .663m(1 tablet) 1 hour later. (Patient not taking: Reported on 10/01/2018) 3 tablet 0  . ondansetron (ZOFRAN ODT) 4 MG disintegrating tablet Take 1 tablet (4 mg total) by mouth every 8 (eight) hours as needed for nausea or vomiting. 6  tablet 0  . predniSONE (DELTASONE) 20 MG tablet Take 60 mg (three tablets) by mouth daily for 2 days, then 45m (two tablets) by mouth daily for 2 days, then 22m(one tablets) daily for 2 days (Patient not taking: Reported on 10/01/2018) 12 tablet 0   No current facility-administered medications for this visit.     REVIEW OF SYSTEMS:    10 Point review of Systems was done is negative except as noted above.  PHYSICAL EXAMINATION: ECOG PERFORMANCE STATUS: 1 - Symptomatic but completely ambulatory  . Vitals:   10/06/18 1512  BP: 138/70  Pulse: 80  Resp: 18  Temp: 98.1 F (36.7 C)  SpO2: 100%   Filed Weights   10/06/18 1512  Weight: 161 lb 12.8 oz (73.4 kg)   .Body mass index is 25.34 kg/m.  GENERAL:alert, in no acute distress and comfortable SKIN: no acute rashes, no significant lesions EYES: conjunctiva are pink and non-injected, sclera anicteric OROPHARYNX: MMM, no exudates, no oropharyngeal erythema or ulceration NECK: supple, no JVD LYMPH:  no palpable lymphadenopathy in the cervical, axillary or inguinal regions LUNGS: clear to auscultation b/l with normal respiratory effort HEART: regular rate & rhythm ABDOMEN:  normoactive bowel sounds , tender over the RUQ. Palpable massive hepatomegaly.  Extremity: no pedal edema PSYCH: alert & oriented x 3 with fluent speech NEURO: no focal motor/sensory deficits  LABORATORY DATA:  I have reviewed the data as listed  . CBC  Latest Ref Rng & Units 10/06/2018 10/01/2018 02/18/2017  WBC 4.0 - 10.5 K/uL 6.9 8.8 7.2  Hemoglobin 13.0 - 17.0 g/dL 13.2 13.3 13.9  Hematocrit 39.0 - 52.0 % 40.4 41.5 42.3  Platelets 150 - 400 K/uL 156 186 217    . CMP Latest Ref Rng & Units 10/06/2018 10/01/2018 02/18/2017  Glucose 70 - 99 mg/dL 96 96 128(H)  BUN 6 - 20 mg/dL 13 21(H) 23(H)  Creatinine 0.61 - 1.24 mg/dL 0.89 1.10 1.06  Sodium 135 - 145 mmol/L 134(L) 133(L) 137  Potassium 3.5 - 5.1 mmol/L 4.6 4.6 4.0  Chloride 98 - 111 mmol/L 101 101 106  CO2 22 - 32 mmol/L 22 21(L) 24  Calcium 8.9 - 10.3 mg/dL 9.6 9.4 9.0  Total Protein 6.5 - 8.1 g/dL 7.6 7.6 -  Total Bilirubin 0.3 - 1.2 mg/dL 2.3(H) 3.4(H) -  Alkaline Phos 38 - 126 U/L 392(H) 327(H) -  AST 15 - 41 U/L 77(H) 90(H) -  ALT 0 - 44 U/L 63(H) 71(H) -     RADIOGRAPHIC STUDIES: I have personally reviewed the radiological images as listed and agreed with the findings in the report. Dg Chest 2 View  Result Date: 10/01/2018 CLINICAL DATA:  Patient c/o RUQ pain and weight loss x 1 month. Patient has lost approx 40 lbs in one month. Patient states he has nausea at times. Both images taken on full inspiration. Hx HTN EXAM: CHEST - 2 VIEW COMPARISON:  02/17/2017 FINDINGS: The heart size and mediastinal contours are within normal limits. Both lungs are clear. No pleural effusion or pneumothorax. The visualized skeletal structures are unremarkable. IMPRESSION: No active cardiopulmonary disease. Electronically Signed   By: DaLajean Manes.D.   On: 10/01/2018 19:28   Ct Abdomen Pelvis W Contrast  Result Date: 10/01/2018 CLINICAL DATA:  Right upper quadrant pain and 40 pound weight loss 1 month. Intermittent nausea. EXAM: CT ABDOMEN AND PELVIS WITH CONTRAST TECHNIQUE: Multidetector CT imaging of the abdomen and pelvis was performed using the standard protocol following bolus administration of  intravenous contrast. CONTRAST:  178m ISOVUE-300 IOPAMIDOL (ISOVUE-300) INJECTION 61%  COMPARISON:  02/18/2017 and 07/10/2011 FINDINGS: Lower chest: Lung bases are normal. Tiny amount of pericardial fluid. Hepatobiliary: Stable 2.1 cm right liver cyst. Interval development of numerous ill-defined hypodense liver masses throughout the entire liver. Largest definable mass over the lateral segment of the left lobe measuring 8.5 cm. Narrowing of the right portal vein. Gallbladder and biliary tree are normal. Pancreas: 1 cm hypodensity over the pancreatic tail not well seen on the previous noncontrast exams. Spleen: Several calcified splenic granulomas are present. Adrenals/Urinary Tract: Adrenal glands are normal. Kidneys are normal in size without hydronephrosis or nephrolithiasis. There is a 2.5 cm solid slightly enhancing mass over the superior pole of the right kidney concerning for renal cell carcinoma. Ureters and bladder are normal. Stomach/Bowel: Stomach and small bowel are normal. Appendix is normal. Mild fecal retention throughout the colon. Vascular/Lymphatic: Minimal calcified plaque over the abdominal aorta. No definite adenopathy. Reproductive: Normal. Other: Small amount of free fluid in the pelvis. Musculoskeletal: Unremarkable. IMPRESSION: 1. Interval development of numerous ill-defined hypodense masses throughout the entire liver compatible with metastatic disease. There is a 2.5 cm enhancing mass over the upper pole cortex of the right kidney suspicious for renal cell carcinoma. 2.  No acute findings in the abdomen/pelvis. 3. 1 cm hypodensity over the pancreatic tail not well seen on previous noncontrast exams. MR may be helpful for further evaluation on an elective basis. Electronically Signed   By: DMarin OlpM.D.   On: 10/01/2018 21:54    ASSESSMENT & PLAN:  57y.o. male with  1. Multiple liver masses concerning for metastatic disease of unknown primary  2. Abnormal LFTs - likely from liver metastases  3. Rt renal mass - concerning for RCC PLAN:  -Discussed patient's  most recent labs from 10/06/18, some borderline monocytosis at 1.2k, otherwise normal blood counts, AST at 77, ALT at 63, Alk Phos at 392, Total Bilirubin at 2.3 -Discussed the 10/01/18 CT A/P which revealed Interval development of numerous ill-defined hypodense masses throughout the entire liver compatible with metastatic disease. There is a 2.5 cm enhancing mass over the upper pole cortex of the right kidney suspicious for renal cell carcinoma. 2.  No acute findings in the abdomen/pelvis. 3. 1 cm hypodensity over the pancreatic tail not well seen on previous noncontrast exams. MR may be helpful for further evaluation on an elective basis.  -Will refer pt to our social worker for help establishing care with a PCP and getting health insurance  -Encouraged the patient to do his best to eat well, smaller meals more frequently  -Will collect blood tests today including tumor markers -Will refer the pt to IR for liver biopsy -Will refer the pt to GI for consideration of colonoscopy -Will order PET/CT scan -Will refill Oxycodone for pain mx -Will see the pt back in 7-10 days     Labs today PET/CT in 5 days CT guided biopsy of liver mass in 2 days SW referral GI referral for concern of colon cancer   All of the patients questions were answered with apparent satisfaction. The patient knows to call the clinic with any problems, questions or concerns.  The total time spent in the appt was 60 minutes and more than 50% was on counseling and direct patient cares.    GSullivan LoneMD MRumsonAAHIVMS [facility] CenterCRockwall [facility] Dba Bay[facility] Surgicare At HeathHematology/Oncology Physician CSpark [facility] (Office):       [phone](Work cell):  [phone](Fax):  [phone]  10/06/2018 4:13 PM  I, Baldwin Jamaica, am acting as a scribe for Dr. Sullivan Lone.   .I have reviewed the above documentation for accuracy and completeness, and I agree with the above. Brunetta Genera MD

## 2018-10-06 NOTE — Telephone Encounter (Signed)
Per 12/04 los scheduled lab for add-on.

## 2018-10-06 NOTE — Patient Instructions (Signed)
Thank you for choosing [facility] to provide your oncology and hematology care.  To afford each patient quality time with our providers, please arrive 30 minutes before your scheduled appointment time.  If you arrive late for your appointment, you may be asked to reschedule.  We strive to give you quality time with our providers, and arriving late affects you and other patients whose appointments are after yours.    If you are a no show for multiple scheduled visits, you may be dismissed from the clinic at the providers discretion.     Again, thank you for choosing [facility], our hope is that these requests will decrease the amount of time that you wait before being seen by our physicians.  ______________________________________________________________________   Should you have questions after your visit to the [facility], please contact our office at [phone] between the hours of 8:30 and 4:30 p.m.    Voicemails left after 4:30p.m will not be returned until the following business day.     For prescription refill requests, please have your pharmacy contact us directly.  Please also try to allow 48 hours for prescription requests.     Please contact the scheduling department for questions regarding scheduling.  For scheduling of procedures such as PET scans, CT scans, MRI, Ultrasound, etc please contact central scheduling at [phone].     Resources For Cancer Patients and Caregivers:    Oncolink.org:  A wonderful resource for patients and healthcare providers for information regarding your disease, ways to tract your treatment, what to expect, etc.      American Cancer Society:  800-227-2345  Can help patients locate various types of support and financial assistance   Cancer Care: 1-800-813-HOPE (4673) Provides financial assistance, online support groups, medication/co-pay assistance.     [place] DSS:  336-641-3447 Where to apply  for food stamps, Medicaid, and utility assistance   Medicare Rights Center: 800-333-4114 Helps people with Medicare understand their rights and benefits, navigate the Medicare system, and secure the quality healthcare they deserve   SCAT: 336-333-6589 [place] [employer]'s shared-ride transportation service for eligible riders who have a disability that prevents them from riding the fixed route bus.     For additional information on assistance programs please contact our social worker:   Abigail Elmore:  [phone]  

## 2018-10-07 ENCOUNTER — Telehealth: Payer: Self-pay | Admitting: Student

## 2018-10-07 ENCOUNTER — Telehealth: Payer: Self-pay | Admitting: *Deleted

## 2018-10-07 ENCOUNTER — Other Ambulatory Visit: Payer: Self-pay | Admitting: Student

## 2018-10-07 ENCOUNTER — Telehealth: Payer: Self-pay | Admitting: Hematology

## 2018-10-07 LAB — HEPATITIS C ANTIBODY: HCV Ab: <0.1 s/co ratio (ref 0.0–0.9)

## 2018-10-07 LAB — AFP TUMOR MARKER: AFP, Serum, Tumor Marker: 8.8 ng/mL — ABNORMAL HIGH (ref 0.0–8.3)

## 2018-10-07 LAB — HEPATITIS B CORE ANTIBODY, TOTAL: HEP B C TOTAL AB: NEGATIVE

## 2018-10-07 LAB — HEPATITIS B SURFACE ANTIGEN: Hepatitis B Surface Ag: NEGATIVE

## 2018-10-07 LAB — CEA (IN HOUSE-CHCC): CEA (CHCC-In House): 2.13 ng/mL (ref 0.00–5.00)

## 2018-10-07 LAB — CANCER ANTIGEN 19-9: CA 19-9: 652 U/mL — ABNORMAL HIGH (ref 0–35)

## 2018-10-07 MED ORDER — OXYCODONE HCL 5 MG PO TABS: 5.0000 mg | ORAL_TABLET | ORAL | 0 refills | Status: DC | PRN

## 2018-10-07 NOTE — Telephone Encounter (Signed)
Spouse called to ask when pain medicine would be refilled. Message sent to Dr. Irene Limbo.

## 2018-10-07 NOTE — Progress Notes (Signed)
ERROR

## 2018-10-07 NOTE — Telephone Encounter (Signed)
Referral sent 12/05

## 2018-10-07 NOTE — Telephone Encounter (Signed)
Wife called to ask if pain med refill was sent. Dr. Irene Limbo sent pain med refill. Caller also asked about when someone would contact them about assisting with financial resources.  Contacted caller at number provided [phone]). Left VM-medication refill sent to pharmacy. [place] advocate Stefanie Libel will be in touch with them once MD plan is in place.

## 2018-10-08 ENCOUNTER — Ambulatory Visit (HOSPITAL_COMMUNITY)
Admission: RE | Admit: 2018-10-08 | Discharge: 2018-10-08 | Disposition: A | Discharge status: Home / Self Care | Payer: Medicaid Other | Source: Ambulatory Visit | Attending: Hematology | Admitting: Hematology

## 2018-10-08 ENCOUNTER — Encounter (HOSPITAL_COMMUNITY): Payer: Self-pay

## 2018-10-08 DIAGNOSIS — I10 Essential (primary) hypertension: Secondary | ICD-10-CM | POA: Insufficient documentation

## 2018-10-08 DIAGNOSIS — N2889 Other specified disorders of kidney and ureter: Secondary | ICD-10-CM | POA: Insufficient documentation

## 2018-10-08 DIAGNOSIS — R011 Cardiac murmur, unspecified: Secondary | ICD-10-CM | POA: Insufficient documentation

## 2018-10-08 DIAGNOSIS — C801 Malignant (primary) neoplasm, unspecified: Secondary | ICD-10-CM

## 2018-10-08 DIAGNOSIS — M109 Gout, unspecified: Secondary | ICD-10-CM | POA: Insufficient documentation

## 2018-10-08 DIAGNOSIS — M1990 Unspecified osteoarthritis, unspecified site: Secondary | ICD-10-CM | POA: Insufficient documentation

## 2018-10-08 DIAGNOSIS — Z7982 Long term (current) use of aspirin: Secondary | ICD-10-CM | POA: Diagnosis not present

## 2018-10-08 DIAGNOSIS — Z79899 Other long term (current) drug therapy: Secondary | ICD-10-CM | POA: Insufficient documentation

## 2018-10-08 DIAGNOSIS — C787 Secondary malignant neoplasm of liver and intrahepatic bile duct: Secondary | ICD-10-CM | POA: Diagnosis not present

## 2018-10-08 LAB — CBC
HCT: 43 % (ref 39.0–52.0)
Hemoglobin: 13.7 g/dL (ref 13.0–17.0)
MCH: 27.9 pg (ref 26.0–34.0)
MCHC: 31.9 g/dL (ref 30.0–36.0)
MCV: 87.6 fL (ref 80.0–100.0)
Platelets: 154 10*3/uL (ref 150–400)
RBC: 4.91 MIL/uL (ref 4.22–5.81)
RDW: 14.7 % (ref 11.5–15.5)
WBC: 7.7 10*3/uL (ref 4.0–10.5)
nRBC: 0 % (ref 0.0–0.2)

## 2018-10-08 LAB — PROTIME-INR
INR: 1.14
Prothrombin Time: 14.5 seconds (ref 11.4–15.2)

## 2018-10-08 LAB — APTT: aPTT: 35 seconds (ref 24–36)

## 2018-10-08 MED ORDER — FENTANYL CITRATE (PF) 100 MCG/2ML IJ SOLN
INTRAMUSCULAR | Status: AC
  Filled 2018-10-08: qty 4

## 2018-10-08 MED ORDER — NALOXONE HCL 0.4 MG/ML IJ SOLN
INTRAMUSCULAR | Status: AC
  Filled 2018-10-08: qty 1

## 2018-10-08 MED ORDER — LIDOCAINE HCL 1 % IJ SOLN
INTRAMUSCULAR | Status: AC
  Filled 2018-10-08: qty 20

## 2018-10-08 MED ORDER — GELATIN ABSORBABLE 12-7 MM EX MISC
CUTANEOUS | Status: AC
  Filled 2018-10-08: qty 1

## 2018-10-08 MED ORDER — MIDAZOLAM HCL 2 MG/2ML IJ SOLN
INTRAMUSCULAR | Status: AC
  Filled 2018-10-08: qty 4

## 2018-10-08 MED ORDER — MIDAZOLAM HCL 2 MG/2ML IJ SOLN
INTRAMUSCULAR | Status: AC | PRN
  Administered 2018-10-08 (×2): 1 mg via INTRAVENOUS

## 2018-10-08 MED ORDER — SODIUM CHLORIDE 0.9 % IV SOLN
INTRAVENOUS | Status: DC
  Administered 2018-10-08: 11:00:00 via INTRAVENOUS

## 2018-10-08 MED ORDER — FENTANYL CITRATE (PF) 100 MCG/2ML IJ SOLN
INTRAMUSCULAR | Status: AC | PRN
  Administered 2018-10-08 (×2): 50 ug via INTRAVENOUS

## 2018-10-08 MED ORDER — FLUMAZENIL 0.5 MG/5ML IV SOLN
INTRAVENOUS | Status: AC
  Filled 2018-10-08: qty 5

## 2018-10-08 NOTE — H&P (Signed)
Chief Complaint: Patient was seen in consultation today for liver biopsy at the request of Brunetta Genera  Referring Physician(s): Brunetta Genera  Supervising Physician: Daryll Brod  Patient Status: [name]  History of Present Illness: [name] is a 57 y.o. male being worked up for newly found right renal mass with innumerable hepatic lesions. He is referred for US guided liver biopsy. PMHx, meds, labs, imaging, allergies reviewed. Feels well, no recent fevers, chills, illness. Has been NPO today as directed. Family at bedside.   Past Medical History:  Diagnosis Date  . Arthritis    "knees; left hand"  . Excessive drinking alcohol    Admitted to 2-3 40oz beers every other night 02/2012  . Gout   . Heart murmur    Longstanding. 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 Hypertension   . Near syncope    05/2011 with tachypalpitations & with echo showing mild LVH, normal EF 55-60%, trivial AR    Past Surgical History:  Procedure Laterality Date  . NO PAST SURGERIES      Allergies: Patient has no known allergies.  Medications: Prior to Admission medications   Medication Sig Start Date End Date Taking? Authorizing Provider  aspirin 325 MG tablet Take 325 mg by mouth daily.   Yes [provider]  oxyCODONE (OXY IR/ROXICODONE) 5 MG immediate release tablet Take 1-2 tablets (5-10 mg total) by mouth every 4 (four) hours as needed for severe pain. 10/07/18  Yes Brunetta Genera, MD  colchicine 0.6 MG tablet Take 1 tablet (0.6 mg total) by mouth See admin instructions. Take 1.2mg  (2 tablets) followed by .6mg  (1 tablet) 1 hour later. Patient not taking: Reported on 10/01/2018 03/27/18   Molpus, Jenny Reichmann, MD  ondansetron (ZOFRAN ODT) 4 MG disintegrating tablet Take 1 tablet (4 mg total) by mouth every 8 (eight) hours as needed for nausea or vomiting. 10/01/18   Francine Graven, DO  predniSONE (DELTASONE) 20 MG tablet Take 60  mg (three tablets) by mouth daily for 2 days, then 40mg  (two tablets) by mouth daily for 2 days, then 20mg  (one tablets) daily for 2 days Patient not taking: Reported on 10/01/2018 03/27/18   Molpus, Jenny Reichmann, MD     Family History  Problem Relation Age of Onset  . Cancer Mother        Possibly pancreatic. Died at age 67    Social History   Socioeconomic History  . Marital status: Married    Spouse name: Not on file  . Number of children: Not on file  . Years of education: Not on file  . Highest education level: Not on file  Occupational History  . Not on file  Social Needs  . Financial resource strain: Not on file  . Food insecurity:    Worry: Not on file    Inability: Not on file  . Transportation needs:    Medical: Not on file    Non-medical: Not on file  Tobacco Use  . Smoking status: Never Smoker  . Smokeless tobacco: Never Used  Substance and Sexual Activity  . Alcohol use: Not Currently    Comment: states he has cut back/ every now and again  . Drug use: No  . Sexual activity: Yes  Lifestyle  . Physical activity:    Days per week: Not on file    Minutes per session: Not on file  . Stress: Not on file  Relationships  . Social connections:  Talks on phone: Not on file    Gets together: Not on file    Attends religious service: Not on file    Active member of club or organization: Not on file    Attends meetings of clubs or organizations: Not on file    Relationship status: Not on file  Other Topics Concern  . Not on file  Social History Narrative   Married. 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 Is from [place].     Review of Systems: A 12 point ROS discussed and pertinent positives are indicated in the HPI above.  All other systems are negative.  Review of Systems  Vital Signs: BP (!) 134/58   Pulse 85   Temp 98.3 F (36.8 C) (Oral)   Resp 18   Ht 5\' 7"  (1.702 m)   Wt 73.4 kg   SpO2 96%   BMI 25.34  kg/m   Physical Exam  Constitutional: He is oriented to person, place, and time. He appears well-developed and well-nourished. No distress.  HENT:  Head: Normocephalic.  Mouth/Throat: Oropharynx is clear and moist.  Neck: Normal range of motion. No JVD present. No tracheal deviation present.  Cardiovascular: Normal rate, regular rhythm and normal heart sounds.  Pulmonary/Chest: Effort normal and breath sounds normal. No respiratory distress.  Abdominal: Soft. He exhibits no distension. There is no tenderness.  RUQ fullness  Neurological: He is alert and oriented to person, place, and time.  Skin: Skin is warm and dry.  Psychiatric: He has a normal mood and affect.    Imaging: Dg Chest 2 View  Result Date: 10/01/2018 CLINICAL DATA:  Patient c/o RUQ pain and weight loss x 1 month. Patient has lost approx 40 lbs in one month. Patient states he has nausea at times. Both images taken on full inspiration. Hx HTN EXAM: CHEST - 2 VIEW COMPARISON:  02/17/2017 FINDINGS: The heart size and mediastinal contours are within normal limits. Both lungs are clear. No pleural effusion or pneumothorax. The visualized skeletal structures are unremarkable. IMPRESSION: No active cardiopulmonary disease. Electronically Signed   By: Lajean Manes M.D.   On: 10/01/2018 19:28   Ct Abdomen Pelvis W Contrast  Result Date: 10/01/2018 CLINICAL DATA:  Right upper quadrant pain and 40 pound weight loss 1 month. Intermittent nausea. EXAM: CT ABDOMEN AND PELVIS WITH CONTRAST TECHNIQUE: Multidetector CT imaging of the abdomen and pelvis was performed using the standard protocol following bolus administration of intravenous contrast. CONTRAST:  163mL ISOVUE-300 IOPAMIDOL (ISOVUE-300) INJECTION 61% COMPARISON:  02/18/2017 and 07/10/2011 FINDINGS: Lower chest: Lung bases are normal. Tiny amount of pericardial fluid. Hepatobiliary: Stable 2.1 cm right liver cyst. Interval development of numerous ill-defined hypodense liver  masses throughout the entire liver. Largest definable mass over the lateral segment of the left lobe measuring 8.5 cm. Narrowing of the right portal vein. Gallbladder and biliary tree are normal. Pancreas: 1 cm hypodensity over the pancreatic tail not well seen on the previous noncontrast exams. Spleen: Several calcified splenic granulomas are present. Adrenals/Urinary Tract: Adrenal glands are normal. Kidneys are normal in size without hydronephrosis or nephrolithiasis. There is a 2.5 cm solid slightly enhancing mass over the superior pole of the right kidney concerning for renal cell carcinoma. Ureters and bladder are normal. Stomach/Bowel: Stomach and small bowel are normal. Appendix is normal. Mild fecal retention throughout the colon. Vascular/Lymphatic: Minimal calcified plaque over the abdominal aorta. No definite adenopathy. Reproductive: Normal. Other: Small amount of free fluid in  the pelvis. Musculoskeletal: Unremarkable. IMPRESSION: 1. Interval development of numerous ill-defined hypodense masses throughout the entire liver compatible with metastatic disease. There is a 2.5 cm enhancing mass over the upper pole cortex of the right kidney suspicious for renal cell carcinoma. 2.  No acute findings in the abdomen/pelvis. 3. 1 cm hypodensity over the pancreatic tail not well seen on previous noncontrast exams. MR may be helpful for further evaluation on an elective basis. Electronically Signed   By: Marin Olp M.D.   On: 10/01/2018 21:54    Labs:  CBC: Recent Labs    10/01/18 1656 10/06/18 1458 10/08/18 1053  WBC 8.8 6.9 7.7  HGB 13.3 13.2 13.7  HCT 41.5 40.4 43.0  PLT 186 156 154    COAGS: Recent Labs    10/01/18 1925 10/08/18 1053  INR 1.11 1.14  APTT  --  35    BMP: Recent Labs    10/01/18 1656 10/06/18 1458  NA 133* 134*  K 4.6 4.6  CL 101 101  CO2 21* 22  GLUCOSE 96 96  BUN 21* 13  CALCIUM 9.4 9.6  CREATININE 1.10 0.89  GFRNONAA >60 >60  GFRAA >60 >60     LIVER FUNCTION TESTS: Recent Labs    10/01/18 1656 10/06/18 1458  BILITOT 3.4* 2.3*  AST 90* 77*  ALT 71* 63*  ALKPHOS 327* 392*  PROT 7.6 7.6  ALBUMIN 3.2* 3.0*    TUMOR MARKERS: No results for input(s): AFPTM, CEA, CA199, CHROMGRNA in the last 8760 hours.  Assessment and Plan: Numerous hepatic with right renal mass suspicious for metastatic process. For US guide liver lesion bx. Labs ok Risks and benefits discussed with the patient including, but not limited to bleeding, infection, damage to adjacent structures or low yield requiring additional tests.  All of the patient's questions were answered, patient is agreeable to proceed. Consent signed and in chart.    Thank you for this interesting consult.  I greatly enjoyed meeting Absalom Aro and look forward to participating in their care.  A copy of this report was sent to the requesting provider on this date.  Electronically Signed: Ascencion Dike, PA-C 10/08/2018, 12:13 PM   I spent a total of 20 Minutes  in face to face in clinical consultation, greater than 50% of which was counseling/coordinating care for liver biopsy

## 2018-10-08 NOTE — Procedures (Signed)
Liver mets, unknown primary  Risks and benefits discussed with the patient including, but not limited to bleeding, infection, damage to adjacent structures or low yield requiring additional tests.  All of the patient's questions were answered, patient is agreeable to proceed. Consent signed and in chart.

## 2018-10-08 NOTE — Discharge Instructions (Signed)
Liver Biopsy, Care After °These instructions give you information on caring for yourself after your procedure. Your doctor may also give you more specific instructions. Call your doctor if you have any problems or questions after your procedure. °Follow these instructions at home: °· Rest at home for 1-2 days or as told by your doctor. °· Have someone stay with you for at least 24 hours. °· Do not do these things in the first 24 hours: °? Drive. °? Use machinery. °? Take care of other people. °? Sign legal documents. °? Take a bath or shower. °· There are many different ways to close and cover a cut (incision). For example, a cut can be closed with stitches, skin glue, or adhesive strips. Follow your doctor's instructions on: °? Taking care of your cut. °? Changing and removing your bandage (dressing). °? Removing whatever was used to close your cut. °· Do not drink alcohol in the first week. °· Do not lift more than 5 pounds or play contact sports for the first 2 weeks. °· Take medicines only as told by your doctor. For 1 week, do not take medicine that has aspirin in it or medicines like ibuprofen. °· Get your test results. °Contact a doctor if: °· A cut bleeds and leaves more than just a small spot of blood. °· A cut is red, puffs up (swells), or hurts more than before. °· Fluid or something else comes from a cut. °· A cut smells bad. °· You have a fever or chills. °Get help right away if: °· You have swelling, bloating, or pain in your belly (abdomen). °· You get dizzy or faint. °· You have a rash. °· You feel sick to your stomach (nauseous) or throw up (vomit). °· You have trouble breathing, feel short of breath, or feel faint. °· Your chest hurts. °· You have problems talking or seeing. °· You have trouble balancing or moving your arms or legs. °This information is not intended to replace advice given to you by your health care provider. Make sure you discuss any questions you have with your health care  provider. °Document Released: 07/29/2008 Document Revised: 03/27/2016 Document Reviewed: 12/16/2013 °Elsevier Interactive Patient Education © 2018 [employer] ° ° ° ° °Moderate Conscious Sedation, Adult, Care After °These instructions provide you with information about caring for yourself after your procedure. Your health care provider may also give you more specific instructions. Your treatment has been planned according to current medical practices, but problems sometimes occur. Call your health care provider if you have any problems or questions after your procedure. °What can I expect after the procedure? °After your procedure, it is common: °· To feel sleepy for several hours. °· To feel clumsy and have poor balance for several hours. °· To have poor judgment for several hours. °· To vomit if you eat too soon. ° °Follow these instructions at home: °For at least 24 hours after the procedure: ° °· Do not: °? Participate in activities where you could fall or become injured. °? Drive. °? Use heavy machinery. °? Drink alcohol. °? Take sleeping pills or medicines that cause drowsiness. °? Make important decisions or sign legal documents. °? Take care of children on your own. °· Rest. °Eating and drinking °· Follow the diet recommended by your health care provider. °· If you vomit: °? Drink water, juice, or soup when you can drink without vomiting. °? Make sure you have little or no nausea before eating solid foods. °General instructions °·   Have a responsible adult stay with you until you are awake and alert. °· Take over-the-counter and prescription medicines only as told by your health care provider. °· If you smoke, do not smoke without supervision. °· Keep all follow-up visits as told by your health care provider. This is important. °Contact a health care provider if: °· You keep feeling nauseous or you keep vomiting. °· You feel light-headed. °· You develop a rash. °· You have a fever. °Get help right away  if: °· You have trouble breathing. °This information is not intended to replace advice given to you by your health care provider. Make sure you discuss any questions you have with your health care provider. °Document Released: 08/10/2013 Document Revised: 03/24/2016 Document Reviewed: 02/09/2016 °Elsevier Interactive Patient Education © 2018 [employer] ° ° °

## 2018-10-11 ENCOUNTER — Encounter: Payer: Self-pay | Admitting: Hematology

## 2018-10-11 NOTE — Progress Notes (Signed)
Called patient's spouse(Delia) after receiving several messages regarding financial assistance.  Advised there is currently no treatment plan in, therefore I have no available resources to offer at this time. Advised all uninsured patients automatically receive a 55% discount for being uninsured for services billed through [facility]. Asked about [facility] FAA application/letter noted on hospital side from August and she states they didn't receive anything. Advised her they must apply for Medicaid first in order to determine if he qualifies for any more than the 55% discount.  She had questions regarding disability/Medicaid. Advised her this is done through the social workers here once the treatment plan has been established and doctor has discussed, recommended and documented disability. She verbalized understanding.  Advised once treatment plan has been established, I will reach out to discuss further options. She verbalized understanding.  She had questions regarding upcoming test and payment options. Advised her to communicate to contacting staff regarding those appointments and needs. She verbalized understanding.

## 2018-10-12 ENCOUNTER — Other Ambulatory Visit: Payer: Self-pay | Admitting: *Deleted

## 2018-10-12 ENCOUNTER — Telehealth: Payer: Self-pay | Admitting: *Deleted

## 2018-10-12 DIAGNOSIS — C787 Secondary malignant neoplasm of liver and intrahepatic bile duct: Secondary | ICD-10-CM

## 2018-10-12 NOTE — Telephone Encounter (Signed)
Patient wife called for results of biopsy from 12/6. Advised her that MD will review biopsy and PET scan (12/13) results at next appt. Wife stated no appt scheduled. Advised her that message will be sent to scheduling for appt next week to review results with MD. Verbalized understanding.

## 2018-10-13 NOTE — Progress Notes (Signed)
FMLA successfully faxed to [facility] at [phone]. Mailed copy to patient address on file.

## 2018-10-15 ENCOUNTER — Ambulatory Visit (HOSPITAL_COMMUNITY)
Admission: RE | Admit: 2018-10-15 | Discharge: 2018-10-15 | Disposition: A | Discharge status: Home / Self Care | Payer: Medicaid Other | Source: Ambulatory Visit | Attending: Hematology | Admitting: Hematology

## 2018-10-15 DIAGNOSIS — N2889 Other specified disorders of kidney and ureter: Secondary | ICD-10-CM | POA: Diagnosis present

## 2018-10-15 DIAGNOSIS — C787 Secondary malignant neoplasm of liver and intrahepatic bile duct: Secondary | ICD-10-CM | POA: Diagnosis present

## 2018-10-15 DIAGNOSIS — C801 Malignant (primary) neoplasm, unspecified: Secondary | ICD-10-CM | POA: Diagnosis present

## 2018-10-15 LAB — GLUCOSE, CAPILLARY: Glucose-Capillary: 73 mg/dL (ref 70–99)

## 2018-10-15 MED ORDER — FLUDEOXYGLUCOSE F - 18 (FDG) INJECTION
8.1000 | Freq: Once | INTRAVENOUS | Status: AC | PRN
  Administered 2018-10-15: 8.1 via INTRAVENOUS

## 2018-10-19 ENCOUNTER — Inpatient Hospital Stay: Payer: Medicaid Other

## 2018-10-19 ENCOUNTER — Encounter: Payer: Self-pay | Admitting: *Deleted

## 2018-10-19 ENCOUNTER — Inpatient Hospital Stay (HOSPITAL_BASED_OUTPATIENT_CLINIC_OR_DEPARTMENT_OTHER): Payer: Medicaid Other | Admitting: Hematology

## 2018-10-19 ENCOUNTER — Telehealth: Payer: Self-pay | Admitting: Hematology

## 2018-10-19 VITALS — BP 128/67 | HR 80 | Temp 98.2°F | Resp 18 | Ht 67.0 in | Wt 157.6 lb

## 2018-10-19 DIAGNOSIS — C787 Secondary malignant neoplasm of liver and intrahepatic bile duct: Secondary | ICD-10-CM

## 2018-10-19 DIAGNOSIS — C7A1 Malignant poorly differentiated neuroendocrine tumors: Secondary | ICD-10-CM | POA: Diagnosis not present

## 2018-10-19 DIAGNOSIS — N2889 Other specified disorders of kidney and ureter: Secondary | ICD-10-CM

## 2018-10-19 DIAGNOSIS — R945 Abnormal results of liver function studies: Secondary | ICD-10-CM

## 2018-10-19 DIAGNOSIS — C7B8 Other secondary neuroendocrine tumors: Secondary | ICD-10-CM

## 2018-10-19 DIAGNOSIS — C7951 Secondary malignant neoplasm of bone: Secondary | ICD-10-CM

## 2018-10-19 DIAGNOSIS — C801 Malignant (primary) neoplasm, unspecified: Principal | ICD-10-CM

## 2018-10-19 DIAGNOSIS — Z7189 Other specified counseling: Secondary | ICD-10-CM

## 2018-10-19 LAB — CBC WITH DIFFERENTIAL (CANCER CENTER ONLY)
ABS IMMATURE GRANULOCYTES: 0.05 10*3/uL (ref 0.00–0.07)
Basophils Absolute: 0.1 10*3/uL (ref 0.0–0.1)
Basophils Relative: 1 %
Eosinophils Absolute: 0.1 10*3/uL (ref 0.0–0.5)
Eosinophils Relative: 2 %
HCT: 40.4 % (ref 39.0–52.0)
Hemoglobin: 13.2 g/dL (ref 13.0–17.0)
Immature Granulocytes: 1 %
Lymphocytes Relative: 17 %
Lymphs Abs: 1.3 10*3/uL (ref 0.7–4.0)
MCH: 27.7 pg (ref 26.0–34.0)
MCHC: 32.7 g/dL (ref 30.0–36.0)
MCV: 84.7 fL (ref 80.0–100.0)
Monocytes Absolute: 1.2 10*3/uL — ABNORMAL HIGH (ref 0.1–1.0)
Monocytes Relative: 16 %
NEUTROS ABS: 4.7 10*3/uL (ref 1.7–7.7)
Neutrophils Relative %: 63 %
Platelet Count: 143 10*3/uL — ABNORMAL LOW (ref 150–400)
RBC: 4.77 MIL/uL (ref 4.22–5.81)
RDW: 14 % (ref 11.5–15.5)
WBC Count: 7.6 10*3/uL (ref 4.0–10.5)
nRBC: 0 % (ref 0.0–0.2)

## 2018-10-19 LAB — CMP (CANCER CENTER ONLY)
ALBUMIN: 2.9 g/dL — AB (ref 3.5–5.0)
ALT: 44 U/L (ref 0–44)
AST: 61 U/L — ABNORMAL HIGH (ref 15–41)
Alkaline Phosphatase: 316 U/L — ABNORMAL HIGH (ref 38–126)
Anion gap: 11 (ref 5–15)
BUN: 16 mg/dL (ref 6–20)
CO2: 23 mmol/L (ref 22–32)
Calcium: 9.5 mg/dL (ref 8.9–10.3)
Chloride: 99 mmol/L (ref 98–111)
Creatinine: 1.01 mg/dL (ref 0.61–1.24)
GFR, Est AFR Am: >60 mL/min (ref >60)
GFR, Estimated: >60 mL/min (ref >60)
GLUCOSE: 109 mg/dL — AB (ref 70–99)
Potassium: 4.7 mmol/L (ref 3.5–5.1)
Sodium: 133 mmol/L — ABNORMAL LOW (ref 135–145)
Total Bilirubin: 2.1 mg/dL — ABNORMAL HIGH (ref 0.3–1.2)
Total Protein: 7.7 g/dL (ref 6.5–8.1)

## 2018-10-19 MED ORDER — OXYCODONE HCL 5 MG PO TABS: 5.0000 mg | ORAL_TABLET | ORAL | 0 refills | Status: DC | PRN

## 2018-10-19 NOTE — Progress Notes (Signed)
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  HISTORY OF PRESENTING ILLNESS:   [name] is a wonderful 57 y.o. male who has been referred to [place] by Dr. Francine Graven for evaluation and management of Liver masses. He is accompanied today by his wife and aunt. The pt reports that he is doing well overall.   The pt presented to the ED on 10/01/18 regarding persistent RUQ abdominal pain which began a month prior, and noted associations with nausea and 40 pound weight loss. He was evaluated with a CT A/P, as noted below, which revealed liver masses.   The pt reports that he first began feeling RUQ abdominal pain about 3-4 weeks ago, but began losing weight about 2 months ago. He notes that he began feeling more fatigue about 2 months ago as well.  The pt notes that the color of his bowels became dark black and denies taking Iron pills or bismuth medications. He notes that he began feeling constipated as well, feeling backed up. He notes that his stools began looking thinner in caliber. The pt notes that his appetite began decreasing over the last 3-4 weeks ago, and some of this he attributes to intermittent nausea. He denies problems swallowing food. He endorses RUQ pain, and denies vomiting and new cough, new bone pains, testicular pain or swelling, problems passing urine, blood in the urine. The pt has never had a colonoscopy and does not have a PCP, and cites his lack of health insurance as the reason.  The pt was released from the ED with Oxycodone and has been using this to treat his RUQ pain successfully.   The pt notes gout and a heart murmur as previous medical history. He notes that he hasn't consumed alcohol in the past 2 years whatsoever, but prior to this alcohol  was a problem for him.   Of note prior to the patient's visit today, pt has had a CT A/P completed on 10/01/18 with results revealing Interval development of numerous ill-defined hypodense masses throughout the entire liver compatible with metastatic disease. There is a 2.5 cm enhancing mass over the upper pole cortex of the right kidney suspicious for renal cell carcinoma. 2.  No acute findings in the abdomen/pelvis. 3. 1 cm hypodensity over the pancreatic tail not well seen on previous noncontrast exams. MR may be helpful for further evaluation on an elective basis.   Most recent lab results (10/06/18) of CBC w/diff and CMP is as follows: all values are WNL except for Monocytes abs at 1.2k, Sodium at 134, Albumin at 3.0, AST at 77, ALT at 63, Alk Phos at 392, Total Bilirubin at 2.3   On review of systems, pt reports new constipation, smaller caliber stools, dark stools, unexpected weight loss, RUQ pain, new fatigue, and denies vomiting, cough, new SOB, changes in breathing, new bone pains, leg swelling, blood in the urine, changes in urination, fevers, pain along the spine, and any other symptoms.   On PMHx the pt reports Gout, heart murmur, previous alcohol abuse. He denies any surgeries.  On Social Hx the pt reports that he hasn't consumed alcohol in the past 2 years whatsoever. He notes that alcohol abuse was previously a concern. He denies ever smoking cigarettes or doing drugs. He denies any radiation or chemical exposure.  On Family  Hx the pt reports mother with pancreatic cancer in 12s, maternal aunts with high blood pressure and heart failure, maternal aunt with breast cancer.  Interval History:  [name] returns today for management and evaluation of his newly diagnosed High grade neuroendocrine carcinoma. The patient's last visit with [place] was on 10/06/18. He is accompanied today by his wife, sister, and his brother who is on the phone. The pt reports that he is doing well overall.    The pt reports that his left hip is intermittently hurting, and his abdomen has continued to hurt, though he endorses some relief with Oxycodone BID.  Of note since the patient's last visit, pt has had a US guided liver biopsy on 10/08/18 which revealed a High Grade small cell Neuroendocrine carcinoma.   The pt also had a PET/CT completed on 10/15/18 with results revealing Diffuse hepatic and osseous metastatic disease without obvious primary neoplasm. 2. Upper pole right renal lesion is not definitely hypermetabolic and has been present since 2012. I think is unlikely the cause of the metastatic disease. Recommend liver biopsy for tissue diagnosis. If needed, an MRI of the abdomen without and with contrast may be helpful for further evaluation of the renal lesion. 3. Two sub 3 mm right upper lobe pulmonary nodules are indeterminate.   Lab results today (10/19/18) of CBC w/diff and CMP is as follows: all values are WNL except for PLT at 143k, Monocytes abs at 1.2k, Sodium at 133, Glucose at 109, Albumin at 2.9, AST at 61, Alk phos at 316, Total bilirubin at 2.1. 10/06/18 Hep B and Hep C were negative 10/06/18 LDH at 215 10/06/18 CA19-9 at 652 10/06/18 AFP Marker at 8.8 10/06/18 CEA normal at 2.13  On review of systems, pt reports intermittent left hip pain, abdominal pain, and denies any other symptoms.   MEDICAL HISTORY:  Past Medical History:  Diagnosis Date  . Arthritis    "knees; left hand"  . Excessive drinking alcohol    Admitted to 2-3 40oz beers every other night 02/2012  . Gout   . Heart murmur    Longstanding. 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 Hypertension   . Near syncope    05/2011 with tachypalpitations & with echo showing mild LVH, normal EF 55-60%, trivial AR    SURGICAL HISTORY: Past Surgical History:  Procedure Laterality Date  . NO PAST SURGERIES      SOCIAL HISTORY: Social History   Socioeconomic History  . Marital status: Married     Spouse name: Not on file  . Number of children: Not on file  . Years of education: Not on file  . Highest education level: Not on file  Occupational History  . Not on file  Social Needs  . Financial resource strain: Not on file  . Food insecurity:    Worry: Not on file    Inability: Not on file  . Transportation needs:    Medical: Not on file    Non-medical: Not on file  Tobacco Use  . Smoking status: Never Smoker  . Smokeless tobacco: Never Used  Substance and Sexual Activity  . Alcohol use: Not Currently    Comment: states he has cut back/ every now and again  . Drug use: No  . Sexual activity: Yes  Lifestyle  . Physical activity:    Days per week: Not on file    Minutes per session: Not on file  . Stress: Not on file  Relationships  . Social connections:    Talks on phone: Not on file    Gets together: Not on file    Attends religious service: Not on file    Active member of club or organization: Not on file    Attends meetings of clubs or organizations: Not on file    Relationship status: Not on file  . Intimate partner violence:    Fear of current or ex partner: Not on file    Emotionally abused: Not on file    Physically abused: Not on file    Forced sexual activity: Not on file  Other Topics Concern  . Not on file  Social History Narrative   Married. 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 Is from [place].     FAMILY HISTORY: Family History  Problem Relation Age of Onset  . Cancer Mother        Possibly pancreatic. Died at age 65    ALLERGIES:  has No Known Allergies.  MEDICATIONS:  Current Outpatient Medications  Medication Sig Dispense Refill  . aspirin 325 MG tablet Take 325 mg by mouth daily.    . colchicine 0.6 MG tablet Take 1 tablet (0.6 mg total) by mouth See admin instructions. Take 1.34m (2 tablets) followed by .667m(1 tablet) 1 hour later. (Patient not taking: Reported on 10/01/2018) 3  tablet 0  . ondansetron (ZOFRAN ODT) 4 MG disintegrating tablet Take 1 tablet (4 mg total) by mouth every 8 (eight) hours as needed for nausea or vomiting. 6 tablet 0  . oxyCODONE (OXY IR/ROXICODONE) 5 MG immediate release tablet Take 1-2 tablets (5-10 mg total) by mouth every 4 (four) hours as needed for severe pain. 90 tablet 0  . predniSONE (DELTASONE) 20 MG tablet Take 60 mg (three tablets) by mouth daily for 2 days, then 4075mtwo tablets) by mouth daily for 2 days, then 78m38mne tablets) daily for 2 days (Patient not taking: Reported on 10/01/2018) 12 tablet 0   No current facility-administered medications for this visit.     REVIEW OF SYSTEMS:    A 10+ POINT REVIEW OF SYSTEMS WAS OBTAINED including neurology, dermatology, psychiatry, cardiac, respiratory, lymph, extremities, GI, GU, Musculoskeletal, constitutional, breasts, reproductive, HEENT.  All pertinent positives are noted in the HPI.  All others are negative.   PHYSICAL EXAMINATION: ECOG PERFORMANCE STATUS: 1 - Symptomatic but completely ambulatory  Vitals:   10/19/18 1504  BP: 128/67  Pulse: 80  Resp: 18  Temp: 98.2 F (36.8 C)  SpO2: 100%   Filed Weights   10/19/18 1504  Weight: 157 lb 9.6 oz (71.5 kg)   .Body mass index is 24.68 kg/m.  GENERAL:alert, in no acute distress and comfortable SKIN: no acute rashes, no significant lesions EYES: conjunctiva are pink and non-injected, sclera anicteric OROPHARYNX: MMM, no exudates, no oropharyngeal erythema or ulceration NECK: supple, no JVD LYMPH:  no palpable lymphadenopathy in the cervical, axillary or inguinal regions LUNGS: clear to auscultation b/l with normal respiratory effort HEART: regular rate & rhythm ABDOMEN:  normoactive bowel sounds, tender over the RUQ. Palpable massive hepatomegaly.   Extremity: no pedal edema PSYCH: alert & oriented x 3 with fluent speech NEURO: no focal motor/sensory deficits   LABORATORY DATA:  I have reviewed the data as  listed  . CBC Latest Ref Rng & Units 10/19/2018 10/08/2018 10/06/2018  WBC 4.0 - 10.5 K/uL 7.6 7.7 6.9  Hemoglobin 13.0 - 17.0 g/dL 13.2 13.7 13.2  Hematocrit 39.0 - 52.0 % 40.4 43.0 40.4  Platelets 150 - 400 K/uL 143(L) 154 156    . CMP Latest Ref Rng & Units 10/19/2018 10/06/2018 10/01/2018  Glucose 70 - 99 mg/dL 109(H) 96 96  BUN 6 - 20 mg/dL 16 13 21(H)  Creatinine 0.61 - 1.24 mg/dL 1.01 0.89 1.10  Sodium 135 - 145 mmol/L 133(L) 134(L) 133(L)  Potassium 3.5 - 5.1 mmol/L 4.7 4.6 4.6  Chloride 98 - 111 mmol/L 99 101 101  CO2 22 - 32 mmol/L 23 22 21(L)  Calcium 8.9 - 10.3 mg/dL 9.5 9.6 9.4  Total Protein 6.5 - 8.1 g/dL 7.7 7.6 7.6  Total Bilirubin 0.3 - 1.2 mg/dL 2.1(H) 2.3(H) 3.4(H)  Alkaline Phos 38 - 126 U/L 316(H) 392(H) 327(H)  AST 15 - 41 U/L 61(H) 77(H) 90(H)  ALT 0 - 44 U/L 44 63(H) 71(H)    10/08/18 Liver Biopsy:    RADIOGRAPHIC STUDIES: I have personally reviewed the radiological images as listed and agreed with the findings in the report. Dg Chest 2 View  Result Date: 10/01/2018 CLINICAL DATA:  Patient c/o RUQ pain and weight loss x 1 month. Patient has lost approx 40 lbs in one month. Patient states he has nausea at times. Both images taken on full inspiration. Hx HTN EXAM: CHEST - 2 VIEW COMPARISON:  02/17/2017 FINDINGS: The heart size and mediastinal contours are within normal limits. Both lungs are clear. No pleural effusion or pneumothorax. The visualized skeletal structures are unremarkable. IMPRESSION: No active cardiopulmonary disease. Electronically Signed   By: Lajean Manes M.D.   On: 10/01/2018 19:28   Ct Abdomen Pelvis W Contrast  Result Date: 10/01/2018 CLINICAL DATA:  Right upper quadrant pain and 40 pound weight loss 1 month. Intermittent nausea. EXAM: CT ABDOMEN AND PELVIS WITH CONTRAST TECHNIQUE: Multidetector CT imaging of the abdomen and pelvis was performed using the standard protocol following bolus administration of intravenous contrast.  CONTRAST:  166m ISOVUE-300 IOPAMIDOL (ISOVUE-300) INJECTION 61% COMPARISON:  02/18/2017 and 07/10/2011 FINDINGS: Lower chest: Lung bases are normal. Tiny amount of pericardial fluid. Hepatobiliary: Stable 2.1 cm right liver cyst. Interval development of numerous ill-defined hypodense liver masses throughout the entire liver. Largest definable mass over the lateral segment of the left lobe measuring 8.5 cm. Narrowing of the right portal vein. Gallbladder and biliary tree are normal. Pancreas: 1 cm hypodensity over the pancreatic tail not well seen on the previous noncontrast exams. Spleen: Several calcified splenic granulomas are present. Adrenals/Urinary Tract: Adrenal glands are normal. Kidneys are normal in size without hydronephrosis or nephrolithiasis. There is a 2.5 cm solid slightly enhancing mass over the superior pole of the right kidney concerning for renal cell carcinoma. Ureters and bladder are normal. Stomach/Bowel: Stomach and small bowel are normal. Appendix is normal. Mild fecal retention throughout the colon. Vascular/Lymphatic: Minimal calcified plaque over the abdominal aorta. No definite adenopathy. Reproductive: Normal. Other: Small amount of free fluid in the pelvis. Musculoskeletal: Unremarkable. IMPRESSION: 1. Interval development of numerous ill-defined hypodense masses throughout the entire liver compatible with metastatic disease. There is a 2.5 cm enhancing mass over the upper pole cortex of the right kidney suspicious for renal cell carcinoma. 2.  No acute findings in the abdomen/pelvis. 3. 1 cm hypodensity over the pancreatic tail not well seen on previous noncontrast exams. MR may be helpful for further evaluation on an elective basis. Electronically Signed   By: DMarin OlpM.D.   On: 10/01/2018 21:54   Nm Pet Image Initial (pi)  Skull Base To Thigh  Result Date: 10/15/2018 CLINICAL DATA:  Initial treatment strategy for numerous metastatic liver lesions. EXAM: NUCLEAR MEDICINE  PET SKULL BASE TO THIGH TECHNIQUE: 8.1 mCi F-18 FDG was injected intravenously. Full-ring PET imaging was performed from the skull base to thigh after the radiotracer. CT data was obtained and used for attenuation correction and anatomic localization. Fasting blood glucose: 74 mg/dl COMPARISON:  CT scan 10/01/2018 FINDINGS: Mediastinal blood pool activity: SUV max 1.48 NECK: No hypermetabolic lymph nodes in the neck. Incidental CT findings: none CHEST: No worrisome pulmonary lesion to suggest a primary lung neoplasm. No enlarged or hypermetabolic mediastinal or hilar lymph nodes. There are 2 sub 3 mm right upper lobe pulmonary nodules which are likely benign but attention on future scans is suggested. No enlarged or hypermetabolic supraclavicular or axillary lymph nodes. Incidental CT findings: none ABDOMEN/PELVIS: Diffuse hepatic metastatic disease with innumerable lesions throughout the enlarged liver. SUV max is 9.62. Scattered splenic calcified granulomas but no splenic lesions or enlargement. No adrenal gland lesions or hypermetabolism. Upper pole right renal lesion is difficult to identify on this study and is better seen on the recent CT scan. No obvious hypermetabolism. MRI abdomen without and with contrast may be helpful for further evaluation of this lesion. This lesion was also present dating back to a CT scan from 2012 but has enlarged. I think it is unlikely the cause of the patient's metastatic disease. No colonic mass or hypermetabolism is identified. No enlarged or hypermetabolic mesenteric or retroperitoneal lymph nodes. Incidental CT findings: none SKELETON: Diffuse osseous metastatic disease involving the axial and appendicular skeleton. Left humeral head/neck lesion has an SUV max of 9.99. T11 lesion has a SUV max of 7.46. Left sacral lesion has a SUV max of 8.87 Left femoral head lesion has an SUV max of 4.86. Incidental CT findings: none IMPRESSION: 1. Diffuse hepatic and osseous metastatic  disease without obvious primary neoplasm. 2. Upper pole right renal lesion is not definitely hypermetabolic and has been present since 2012. I think is unlikely the cause of the metastatic disease. Recommend liver biopsy for tissue diagnosis. If needed, an MRI of the abdomen without and with contrast may be helpful for further evaluation of the renal lesion. 3. Two sub 3 mm right upper lobe pulmonary nodules are indeterminate. Electronically Signed   By: Marijo Sanes M.D.   On: 10/15/2018 17:05   US Biopsy (liver)  Result Date: 10/08/2018 INDICATION: Liver metastases, unknown primary EXAM: ULTRASOUND GUIDED CORE BIOPSY OF LEFT HEPATIC MASS MEDICATIONS: 1% LIDOCAINE LOCAL ANESTHESIA/SEDATION: Versed 2.39m IV; Fentanyl 1044m IV; Moderate Sedation Time:  14 MINUTES The patient was continuously monitored during the procedure by the interventional radiology nurse under my direct supervision. FLUOROSCOPY TIME:  Fluoroscopy Time: NONE. COMPLICATIONS: None immediate. PROCEDURE: The procedure, risks, benefits, and alternatives were explained to the patient. Questions regarding the procedure were encouraged and answered. The patient understands and consents to the procedure. Previous imaging reviewed. Preliminary ultrasound performed. A left hepatic lesion was localized and marked. Under sterile conditions and local anesthesia, a 17 gauge coaxial guide needle was advanced from a subxiphoid anterior approach to the left hepatic lesion. Needle position confirmed with ultrasound. 18 gauge core biopsies obtained. Samples placed in formalin. Needle removed. Postprocedure imaging demonstrates no hemorrhage or hematoma. Patient tolerated biopsy well. FINDINGS: Imaging confirms needle placed in the left hepatic lesion for core biopsy IMPRESSION: Successful ultrasound left hepatic mass 18 gauge core biopsy Electronically Signed   By: M.Jerilynn Mages Shick  M.D.   On: 10/08/2018 13:44    ASSESSMENT & PLAN:  57 y.o. male with  1.  Multiple liver masses concerning for metastatic disease of unknown primary  10/01/18 CT A/P revealed Interval development of numerous ill-defined hypodense masses throughout the entire liver compatible with metastatic disease. There is a 2.5 cm enhancing mass over the upper pole cortex of the right kidney suspicious for renal cell carcinoma. 2.  No acute findings in the abdomen/pelvis. 3. 1 cm hypodensity over the pancreatic tail not well seen on previous noncontrast exams. MR may be helpful for further evaluation on an elective basis.    2. Abnormal LFTs - likely from liver metastases  Labs upon initial presentation from 10/06/18, some borderline monocytosis at 1.2k, otherwise normal blood counts, AST at 77, ALT at 63, Alk Phos at 392, Total Bilirubin at 2.3   3. Rt renal mass - concerning for RCC  PLAN:  -Discussed pt labwork today, 10/19/18; blood counts are stable, AST decreased to 61, ALT normalized to 44, Alk Phos decreased to 316, Total Bilirubin at 2.1 -10/06/18 Hep B and Hep C were negative  -10/06/18 CA19-9 elevated at 652, AFP slightly elevated at 8.8, CEA normal at 2.13, LDH slightly elevated at 215 -Discussed the 10/15/18 PET/CT which revealed Diffuse hepatic and osseous metastatic disease without obvious primary neoplasm. 2. Upper pole right renal lesion is not definitely hypermetabolic and has been present since 2012. I think is unlikely the cause of the metastatic disease. Recommend liver biopsy for tissue diagnosis. If needed, an MRI of the abdomen without and with contrast may be helpful for further evaluation of the renal lesion. 3. Two sub 3 mm right upper lobe pulmonary nodules are indeterminate. -Discussed the 10/08/18 US Liver Biopsy which revealed High Grade Neuroendocrine Carcinoma -Discussed that the PET/CT did not reveal an obvious primary but I do suspect primary is small bowel -Discussed again that the staging indicated is Stage IV, and that the treatment has palliative  intent and will therefore not be curative  -Discussed that the treatment indicated in this situation by the NCCN guidelines is Carboplatin and Etoposide every 3 weeks, with G-CSF support for up to 6 cycles -Will send supportive medications to pharmacy  -Will order MRI Brain to complete staging consider small cell high grade neuroendocrine phenotype. -Will set the pt up for chemotherapy counseling for carboplatin + Etoposide -Will provide pt with contact information to Social Work  -Strongly recommended that the pt not lift, push, or pull anything to prevent the risk of pathologic fractures as the PET/CT does indicate bone involvement -Will begin Xgeva every 4 weeks -Discussed that there may be a role to refer the pt to Rad Onc for RT for local pain control -Will refer the pt to IR for Port insertion  -Will plan to initiate treatment in roughly one week  -Will refer the pt to our nutritional therapist -Again encouraged the patient to do his best to eat well, smaller meals more frequently -Oxycodone for pain mx, up to every 6 hours -Will see the pt back in 1 week   MRI brain for staging in 2-3 days Chemo-counseling for carboplatin/Etoposide in 2 days Carboplatin/Etoposide chemotherapy to start ASAP within 5 days Xgeva q4weeks to start in 5 days Port a cath placement in 2-3 days Education officer, museum consultation for barrier to treatment assessment RTC with Dr Irene Limbo 1 week post chemotherapy with labs for toxicity check   All of the patients questions were answered with apparent satisfaction. The  patient knows to call the clinic with any problems, questions or concerns.  The total time spent in the appt was 40 minutes and more than 50% was on counseling and direct patient cares.    Sullivan Lone MD MS AAHIVMS [facility] Hematology/Oncology Physician [facility]  (Office):       [phone] (Work cell):  [phone] (Fax):           [phone]  10/19/2018 3:58 PM  I, Baldwin [place], am acting as a scribe for Dr. Sullivan Lone.   .I have reviewed the above documentation for accuracy and completeness, and I agree with the above. Brunetta Genera MD

## 2018-10-19 NOTE — Telephone Encounter (Signed)
Printed calendar and avs. °

## 2018-10-20 ENCOUNTER — Inpatient Hospital Stay: Payer: Medicaid Other

## 2018-10-20 ENCOUNTER — Telehealth (HOSPITAL_COMMUNITY): Payer: Self-pay

## 2018-10-20 ENCOUNTER — Telehealth: Payer: Self-pay | Admitting: General Practice

## 2018-10-20 DIAGNOSIS — C787 Secondary malignant neoplasm of liver and intrahepatic bile duct: Secondary | ICD-10-CM | POA: Insufficient documentation

## 2018-10-20 DIAGNOSIS — C7951 Secondary malignant neoplasm of bone: Secondary | ICD-10-CM | POA: Insufficient documentation

## 2018-10-20 DIAGNOSIS — C801 Malignant (primary) neoplasm, unspecified: Principal | ICD-10-CM

## 2018-10-20 DIAGNOSIS — Z7189 Other specified counseling: Secondary | ICD-10-CM | POA: Insufficient documentation

## 2018-10-20 MED ORDER — LORAZEPAM 0.5 MG PO TABS: 0.5000 mg | ORAL_TABLET | Freq: Four times a day (QID) | ORAL | 0 refills | Status: DC | PRN

## 2018-10-20 MED ORDER — ONDANSETRON HCL 8 MG PO TABS: 8.0000 mg | ORAL_TABLET | Freq: Two times a day (BID) | ORAL | 1 refills | Status: DC | PRN

## 2018-10-20 MED ORDER — PROCHLORPERAZINE MALEATE 10 MG PO TABS: 10.0000 mg | ORAL_TABLET | Freq: Four times a day (QID) | ORAL | 1 refills | Status: DC | PRN

## 2018-10-20 MED ORDER — LIDOCAINE-PRILOCAINE 2.5-2.5 % EX CREA: TOPICAL_CREAM | CUTANEOUS | 3 refills | Status: DC

## 2018-10-20 NOTE — Progress Notes (Signed)
START OFF PATHWAY REGIMEN - [Other Dx]   [id number]:Carboplatin AUC=5 D1 + Etoposide 100 mg/m2 D1-3 q21 Days:   A cycle is every 21 days:     Etoposide      Carboplatin   **Always confirm dose/schedule in your pharmacy ordering system**  Patient Characteristics: Intent of Therapy: Non-Curative / Palliative Intent, Discussed with Patient

## 2018-10-20 NOTE — Telephone Encounter (Signed)
[place] CSW Progress Notes  Returned call from patient, left VM.  No indication on message of his concern.  Edwyna Shell, LCSW Clinical Social Worker Phone:  [phone]

## 2018-10-20 NOTE — Telephone Encounter (Signed)
Called to schedule port placement, no answer, left vm. AW  

## 2018-10-21 ENCOUNTER — Telehealth: Payer: Self-pay | Admitting: General Practice

## 2018-10-21 NOTE — Telephone Encounter (Signed)
[place] CSW Progress Notes  Called patient, wants help in applying for disability.  Referral made to [facility].  Also recommended he establish w PCP, can establish at [facility] site has appointments), this will enable patient to be assisted w enrolling for [employer], [place], [facility] pharmacy access and primary care services as needed. Contact information given, patient encouraged to obtain appointment.  Edwyna Shell, LCSW Clinical Social Worker Phone:  [phone]

## 2018-10-22 ENCOUNTER — Ambulatory Visit (HOSPITAL_COMMUNITY)
Admission: EM | Admit: 2018-10-22 | Discharge: 2018-10-22 | Disposition: A | Discharge status: Home / Self Care | Payer: MEDICAID | Attending: Internal Medicine | Admitting: Internal Medicine

## 2018-10-22 ENCOUNTER — Ambulatory Visit (HOSPITAL_COMMUNITY)
Admission: RE | Admit: 2018-10-22 | Discharge: 2018-10-22 | Disposition: A | Discharge status: Home / Self Care | Payer: Medicaid Other | Source: Ambulatory Visit | Attending: Internal Medicine | Admitting: Internal Medicine

## 2018-10-22 ENCOUNTER — Encounter (HOSPITAL_COMMUNITY): Payer: Self-pay

## 2018-10-22 ENCOUNTER — Telehealth: Payer: Self-pay | Admitting: *Deleted

## 2018-10-22 DIAGNOSIS — M7989 Other specified soft tissue disorders: Secondary | ICD-10-CM | POA: Insufficient documentation

## 2018-10-22 DIAGNOSIS — R609 Edema, unspecified: Secondary | ICD-10-CM

## 2018-10-22 NOTE — Telephone Encounter (Signed)
Patient spouse called. Stated they had called [facility] Triage Line due to patient's left foot being swollen. Triage nurse advised them to go to Urgent Care, they were going to [facility].States on left foot swollen, but not painful or reddened.

## 2018-10-22 NOTE — ED Provider Notes (Signed)
[place]    CSN: [id number] Arrival date & time: 10/22/18  4401     History   Chief Complaint Chief Complaint  Patient presents with  . Left Foot Swelling    HPI [name] is a 57 y.o. male.   [name] is a 57 year old male who has been recently diagnosed with multiple liver masses concerning for metastatic disease of unknown primary, abnormal LFTs likely from liver metastases and right renal mass concerning for RCC. He is currently being evaluated by Dr. Sullivan Lone and a plan has been developed. He is set to start palliative therapies next week. Patient presents to the urgent care today with acute left foot swelling. His wife noticed it last night upon going to bed. Patient denies any pain, warmth, redness, calf pain/swelling, abdominal pain, melena, bloody stools, fevers, chilsl or any concerning symptoms.   The following portions of the patient's history were reviewed and updated as appropriate: allergies, current medications, past family history, past medical history, past social history, past surgical history and problem list.         Past Medical History:  Diagnosis Date  . Arthritis    "knees; left hand"  . Excessive drinking alcohol    Admitted to 2-3 40oz beers every other night 02/2012  . Gout   . Heart murmur    Longstanding. 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 Hypertension   . Near syncope    05/2011 with tachypalpitations & with echo showing mild LVH, normal EF 55-60%, trivial AR    Patient Active Problem List   Diagnosis Date Noted  . Bone metastases ([place]) 10/20/2018  . 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 Counseling regarding advance care planning and goals of care 10/20/2018  . Gout 03/21/2016  . Chest pain 02/18/2012  . Alcohol abuse 02/18/2012  . Syncope 05/28/2011  . Palpitations 05/28/2011  . Murmur 05/28/2011    Past Surgical History:   Procedure Laterality Date  . NO PAST SURGERIES         Home Medications    Prior to Admission medications   Medication Sig Start Date End Date Taking? Authorizing Provider  aspirin 325 MG tablet Take 325 mg by mouth daily.    [provider]  colchicine 0.6 MG tablet Take 1 tablet (0.6 mg total) by mouth See admin instructions. Take 1.2mg  (2 tablets) followed by .6mg  (1 tablet) 1 hour later. Patient not taking: Reported on 10/01/2018 03/27/18   Molpus, Jenny Reichmann, MD  lidocaine-prilocaine (EMLA) cream Apply to affected area once 10/20/18   Brunetta Genera, MD  LORazepam (ATIVAN) 0.5 MG tablet Take 1 tablet (0.5 mg total) by mouth every 6 (six) hours as needed (Nausea or vomiting). 10/20/18   Brunetta Genera, MD  ondansetron (ZOFRAN ODT) 4 MG disintegrating tablet Take 1 tablet (4 mg total) by mouth every 8 (eight) hours as needed for nausea or vomiting. 10/01/18   Francine Graven, DO  ondansetron (ZOFRAN) 8 MG tablet Take 1 tablet (8 mg total) by mouth 2 (two) times daily as needed for refractory nausea / vomiting. Start on day 3 after carboplatin chemo. 10/20/18   Brunetta Genera, MD  oxyCODONE (OXY IR/ROXICODONE) 5 MG immediate release tablet Take 1-2 tablets (5-10 mg total) by mouth every 4 (four) hours as needed for severe pain. 10/19/18   Brunetta Genera, MD  predniSONE (DELTASONE) 20 MG tablet Take 60 mg (  three tablets) by mouth daily for 2 days, then 40mg  (two tablets) by mouth daily for 2 days, then 20mg  (one tablets) daily for 2 days Patient not taking: Reported on 10/01/2018 03/27/18   Molpus, Jenny Reichmann, MD  prochlorperazine (COMPAZINE) 10 MG tablet Take 1 tablet (10 mg total) by mouth every 6 (six) hours as needed (Nausea or vomiting). 10/20/18   Brunetta Genera, MD    Family History Family History  Problem Relation Age of Onset  . Cancer Mother        Possibly pancreatic. Died at age 33    Social History Social History   Tobacco Use  . Smoking  status: Never Smoker  . Smokeless tobacco: Never Used  Substance Use Topics  . Alcohol use: Not Currently    Comment: states he has cut back/ every now and again  . Drug use: No     Allergies   Patient has no known allergies.   Review of Systems Review of Systems  Respiratory: Negative.   Cardiovascular: Negative.   Gastrointestinal: Negative.   Genitourinary: Negative.   Skin: Negative.   All other systems reviewed and are negative.    Physical Exam Triage Vital Signs ED Triage Vitals  Enc Vitals Group     BP 10/22/18 0911 133/70     Pulse Rate 10/22/18 0911 77     Resp 10/22/18 0911 18     Temp 10/22/18 0911 98.3 F (36.8 C)     Temp Source 10/22/18 0911 Oral     SpO2 10/22/18 0911 98 %     Weight --      Height --      Head Circumference --      Peak Flow --      Pain Score 10/22/18 0913 0     Pain Loc --      Pain Edu? --      Excl. in [place]? --    No data found.  Updated Vital Signs BP 133/70 (BP Location: Left Arm)   Pulse 77   Temp 98.3 F (36.8 C) (Oral)   Resp 18   SpO2 98%   Visual Acuity Right Eye Distance:   Left Eye Distance:   Bilateral Distance:    Right Eye Near:   Left Eye Near:    Bilateral Near:     Physical Exam Constitutional:      General: He is not in acute distress.    Appearance: Normal appearance. He is not toxic-appearing.  HENT:     Head: Normocephalic.  Neck:     Musculoskeletal: Normal range of motion and neck supple.  Cardiovascular:     Rate and Rhythm: Normal rate and regular rhythm.     Pulses: Normal pulses.     Heart sounds: Normal heart sounds.  Pulmonary:     Effort: Pulmonary effort is normal.     Breath sounds: Normal breath sounds.  Musculoskeletal: Normal range of motion.     Left foot: Normal range of motion and normal capillary refill. Swelling present. No tenderness or deformity.  Skin:    General: Skin is warm and dry.  Neurological:     General: No focal deficit present.     Mental Status:  He is alert and oriented to person, place, and time.  Psychiatric:        Mood and Affect: Mood normal.      UC Treatments / Results  Labs (all labs ordered are listed, but only abnormal results are displayed)  Labs Reviewed - No data to display  EKG None  Radiology No results found.  Procedures Procedures (including critical care time)  Medications Ordered in UC Medications - No data to display  Initial Impression / Assessment and Plan / UC Course  I have reviewed the triage vital signs and the nursing notes.  Pertinent labs & imaging results that were available during my care of the patient were reviewed by me and considered in my medical decision making (see chart for details).    57 year old male recently diagnosed with multiple liver masses concerning for metastatic disease of unknown primary, abnormal LFTs likely from liver metastases and right renal mass concerning for RCC resents today with acute left foot swelling.  Patient is alert and oriented x3.  He is nontoxic-appearing.  Denies any other symptoms.  No other significant findings on exam. Spoke with Lovey Newcomer, nurse for patient's oncologist, Dr. Irene Limbo.  Based on his current history, Dr. Irene Limbo agrees that the patient is at increased risk for a DVT and request that we obtain an ultrasound as soon as possible. Ultrasound of the left lower extremity has been scheduled for today at 12:15 PM at [place]. Results will be relayed back to [place] as well as to the patient's oncologist. Notified Dr. Grier Mitts nurse [facility]) of this appointment as well and she will be looking out for the results this afternoon.   Discussed diagnosis with patient and his wife. Patient understands verbal and written discharge instructions. Patient comfortable with plan and disposition.  Patient has a clear mental status at this time, good insight into illness (after discussion and teaching) and has clear judgment to make decisions regarding their  care.  Documentation was completed with the aid of voice recognition software. Transcription may contain typographical errors.  Final Clinical Impressions(s) / UC Diagnoses   Final diagnoses:  Swelling of left foot     Discharge Instructions     Go to [place] [address] at 12:15 noon today to get an ultrasound of your foot. Results with be given to Dr. Irene Limbo.     ED Prescriptions    None     Controlled Substance Prescriptions [place] Controlled Substance Registry consulted? Not Applicable   Enrique Sack, [place] 10/22/18 1048

## 2018-10-22 NOTE — Discharge Instructions (Addendum)
Go to [place] [address] at 12:15 noon today to get an ultrasound of your foot. Results of the test will be relayed to your oncologist, Dr. Irene Limbo.

## 2018-10-22 NOTE — Progress Notes (Unsigned)
Today's bilateral lower extremity venous duplex is negative for DVT. Rouleaux flow noted throughout. 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 Incompetent valve noted in the proximal left femoral vein. No answer twice at Dr. Meryl Crutch office. Dr. Debara Pickett, doctor of the day, was notified at 1:05 and he said the patient could leave.

## 2018-10-22 NOTE — ED Triage Notes (Signed)
Pt presents with left foot, ankle and calf swelling with no complaints of pain.

## 2018-10-25 ENCOUNTER — Other Ambulatory Visit: Payer: Self-pay | Admitting: Physician Assistant

## 2018-10-25 ENCOUNTER — Encounter: Payer: Self-pay | Admitting: Hematology

## 2018-10-25 NOTE — Progress Notes (Signed)
Called patient to introduce myself as Arboriculturist and to offer available resources such as one-time $700 [employer]. Patient is uninsured and Rob will work with regarding drug replacement. Patient will need to apply for Medicaid before he can apply for any additional discount through the [facility].  Left my name and contact number to return my call.

## 2018-10-26 ENCOUNTER — Ambulatory Visit (HOSPITAL_COMMUNITY)
Admission: RE | Admit: 2018-10-26 | Discharge: 2018-10-26 | Disposition: A | Discharge status: Home / Self Care | Payer: Medicaid Other | Source: Ambulatory Visit | Attending: Hematology | Admitting: Hematology

## 2018-10-26 ENCOUNTER — Other Ambulatory Visit: Payer: Self-pay | Admitting: Hematology

## 2018-10-26 ENCOUNTER — Encounter (HOSPITAL_COMMUNITY): Payer: Self-pay

## 2018-10-26 ENCOUNTER — Telehealth: Payer: Self-pay | Admitting: Hematology

## 2018-10-26 ENCOUNTER — Other Ambulatory Visit (HOSPITAL_COMMUNITY): Payer: Self-pay

## 2018-10-26 DIAGNOSIS — I10 Essential (primary) hypertension: Secondary | ICD-10-CM | POA: Insufficient documentation

## 2018-10-26 DIAGNOSIS — C7951 Secondary malignant neoplasm of bone: Secondary | ICD-10-CM

## 2018-10-26 DIAGNOSIS — C801 Malignant (primary) neoplasm, unspecified: Secondary | ICD-10-CM

## 2018-10-26 DIAGNOSIS — C7A8 Other malignant neuroendocrine tumors: Secondary | ICD-10-CM | POA: Diagnosis not present

## 2018-10-26 DIAGNOSIS — Z79899 Other long term (current) drug therapy: Secondary | ICD-10-CM | POA: Insufficient documentation

## 2018-10-26 DIAGNOSIS — C787 Secondary malignant neoplasm of liver and intrahepatic bile duct: Secondary | ICD-10-CM

## 2018-10-26 DIAGNOSIS — Z809 Family history of malignant neoplasm, unspecified: Secondary | ICD-10-CM | POA: Diagnosis not present

## 2018-10-26 HISTORY — PX: IR IMAGING GUIDED PORT INSERTION: IMG5740

## 2018-10-26 LAB — CBC
HCT: 41.2 % (ref 39.0–52.0)
HEMOGLOBIN: 13.3 g/dL (ref 13.0–17.0)
MCH: 27.6 pg (ref 26.0–34.0)
MCHC: 32.3 g/dL (ref 30.0–36.0)
MCV: 85.5 fL (ref 80.0–100.0)
Platelets: 148 10*3/uL — ABNORMAL LOW (ref 150–400)
RBC: 4.82 MIL/uL (ref 4.22–5.81)
RDW: 13.9 % (ref 11.5–15.5)
WBC: 7.4 10*3/uL (ref 4.0–10.5)
nRBC: 0 % (ref 0.0–0.2)

## 2018-10-26 LAB — PROTIME-INR
INR: 1.11
Prothrombin Time: 14.2 seconds (ref 11.4–15.2)

## 2018-10-26 MED ORDER — MIDAZOLAM HCL 2 MG/2ML IJ SOLN
INTRAMUSCULAR | Status: AC
  Filled 2018-10-26: qty 4

## 2018-10-26 MED ORDER — FENTANYL CITRATE (PF) 100 MCG/2ML IJ SOLN
INTRAMUSCULAR | Status: AC | PRN
  Administered 2018-10-26: 50 ug via INTRAVENOUS

## 2018-10-26 MED ORDER — LIDOCAINE-EPINEPHRINE (PF) 2 %-1:200000 IJ SOLN
INTRAMUSCULAR | Status: AC | PRN
  Administered 2018-10-26: 10 mL via EPIDURAL

## 2018-10-26 MED ORDER — CEFAZOLIN SODIUM-DEXTROSE 2-4 GM/100ML-% IV SOLN
2.0000 g | Freq: Once | INTRAVENOUS | Status: AC
  Administered 2018-10-26: 2 g via INTRAVENOUS

## 2018-10-26 MED ORDER — MIDAZOLAM HCL 2 MG/2ML IJ SOLN
INTRAMUSCULAR | Status: AC | PRN
  Administered 2018-10-26 (×3): 1 mg via INTRAVENOUS

## 2018-10-26 MED ORDER — FENTANYL CITRATE (PF) 100 MCG/2ML IJ SOLN
INTRAMUSCULAR | Status: AC
  Filled 2018-10-26: qty 2

## 2018-10-26 MED ORDER — GADOBUTROL 1 MMOL/ML IV SOLN
7.0000 mL | Freq: Once | INTRAVENOUS | Status: AC | PRN
  Administered 2018-10-26: 7 mL via INTRAVENOUS

## 2018-10-26 MED ORDER — LIDOCAINE-EPINEPHRINE (PF) 2 %-1:200000 IJ SOLN
INTRAMUSCULAR | Status: AC
  Filled 2018-10-26: qty 20

## 2018-10-26 MED ORDER — CEFAZOLIN SODIUM-DEXTROSE 2-4 GM/100ML-% IV SOLN
INTRAVENOUS | Status: AC
  Administered 2018-10-26: 2 g via INTRAVENOUS
  Filled 2018-10-26: qty 100

## 2018-10-26 MED ORDER — HEPARIN SOD (PORK) LOCK FLUSH 100 UNIT/ML IV SOLN
INTRAVENOUS | Status: AC
  Filled 2018-10-26: qty 5

## 2018-10-26 MED ORDER — SODIUM CHLORIDE 0.9 % IV SOLN
INTRAVENOUS | Status: DC
  Administered 2018-10-26: 08:00:00 via INTRAVENOUS

## 2018-10-26 MED ORDER — LIDOCAINE-EPINEPHRINE (PF) 2 %-1:200000 IJ SOLN
INTRAMUSCULAR | Status: AC | PRN
  Administered 2018-10-26: 5 mL

## 2018-10-26 NOTE — Discharge Instructions (Signed)
Moderate Conscious Sedation, Adult, Care After  These instructions provide you with information about caring for yourself after your procedure. Your health care provider may also give you more specific instructions. Your treatment has been planned according to current medical practices, but problems sometimes occur. Call your health care provider if you have any problems or questions after your procedure.  What can I expect after the procedure?  After your procedure, it is common:  · To feel sleepy for several hours.  · To feel clumsy and have poor balance for several hours.  · To have poor judgment for several hours.  · To vomit if you eat too soon.  Follow these instructions at home:  For at least 24 hours after the procedure:    · Do not:  ? Participate in activities where you could fall or become injured.  ? Drive.  ? Use heavy machinery.  ? Drink alcohol.  ? Take sleeping pills or medicines that cause drowsiness.  ? Make important decisions or sign legal documents.  ? Take care of children on your own.  · Rest.  Eating and drinking  · Follow the diet recommended by your health care provider.  · If you vomit:  ? Drink water, juice, or soup when you can drink without vomiting.  ? Make sure you have little or no nausea before eating solid foods.  General instructions  · Have a responsible adult stay with you until you are awake and alert.  · Take over-the-counter and prescription medicines only as told by your health care provider.  · If you smoke, do not smoke without supervision.  · Keep all follow-up visits as told by your health care provider. This is important.  Contact a health care provider if:  · You keep feeling nauseous or you keep vomiting.  · You feel light-headed.  · You develop a rash.  · You have a fever.  Get help right away if:  · You have trouble breathing.  This information is not intended to replace advice given to you by your health care provider. Make sure you discuss any questions you have  with your health care provider.  Document Released: 08/10/2013 Document Revised: 03/24/2016 Document Reviewed: 02/09/2016  Elsevier Interactive Patient Education © 2019 Elsevier Inc.  Implanted Port Insertion, Care After  This sheet gives you information about how to care for yourself after your procedure. Your health care provider may also give you more specific instructions. If you have problems or questions, contact your health care provider.  What can I expect after the procedure?  After the procedure, it is common to have:  · Discomfort at the port insertion site.  · Bruising on the skin over the port. This should improve over 3-4 days.  Follow these instructions at home:  Port care  · After your port is placed, you will get a manufacturer's information card. The card has information about your port. Keep this card with you at all times.  · Take care of the port as told by your health care provider. Ask your health care provider if you or a family member can get training for taking care of the port at home. A home health care nurse may also take care of the port.  · Make sure to remember what type of port you have.  Incision care         · Follow instructions from your health care provider about how to take care of your port insertion   site. Make sure you:  ? Wash your hands with soap and water before and after you change your bandage (dressing). If soap and water are not available, use hand sanitizer.  ? Change your dressing as told by your health care provider.  ? Leave stitches (sutures), skin glue, or adhesive strips in place. These skin closures may need to stay in place for 2 weeks or longer. If adhesive strip edges start to loosen and curl up, you may trim the loose edges. Do not remove adhesive strips completely unless your health care provider tells you to do that.  · Check your port insertion site every day for signs of infection. Check for:  ? Redness, swelling, or pain.  ? Fluid or  blood.  ? Warmth.  ? Pus or a bad smell.  Activity  · Return to your normal activities as told by your health care provider. Ask your health care provider what activities are safe for you.  · Do not lift anything that is heavier than 10 lb (4.5 kg), or the limit that you are told, until your health care provider says that it is safe.  General instructions  · Take over-the-counter and prescription medicines only as told by your health care provider.  · Do not take baths, swim, or use a hot tub until your health care provider approves. Ask your health care provider if you may take showers. You may only be allowed to take sponge baths.  · Do not drive for 24 hours if you were given a sedative during your procedure.  · Wear a medical alert bracelet in case of an emergency. This will tell any health care providers that you have a port.  · Keep all follow-up visits as told by your health care provider. This is important.  Contact a health care provider if:  · You cannot flush your port with saline as directed, or you cannot draw blood from the port.  · You have a fever or chills.  · You have redness, swelling, or pain around your port insertion site.  · You have fluid or blood coming from your port insertion site.  · Your port insertion site feels warm to the touch.  · You have pus or a bad smell coming from the port insertion site.  Get help right away if:  · You have chest pain or shortness of breath.  · You have bleeding from your port that you cannot control.  Summary  · Take care of the port as told by your health care provider. Keep the manufacturer's information card with you at all times.  · Change your dressing as told by your health care provider.  · Contact a health care provider if you have a fever or chills or if you have redness, swelling, or pain around your port insertion site.  · Keep all follow-up visits as told by your health care provider.  This information is not intended to replace advice given to  you by your health care provider. Make sure you discuss any questions you have with your health care provider.  Document Released: 08/10/2013 Document Revised: 05/18/2018 Document Reviewed: 05/18/2018  Elsevier Interactive Patient Education © 2019 Elsevier Inc.

## 2018-10-26 NOTE — Progress Notes (Signed)
Pt was d/c to [facility], has appointment at 1200 today.  Accompanied by family.  Left IV site intact, flushed with 42ml NS and locked off.  MRI stated, when called, that they would need an IV.

## 2018-10-26 NOTE — Procedures (Signed)
Pre Procedure Dx: Metastatic neuroendocrine cancer.  Post Procedural Dx: Same  Successful placement of right IJ approach port-a-cath with tip at the superior caval atrial junction. The catheter is ready for immediate use.  Estimated Blood Loss: Minimal  Complications: None immediate.  Ronny Bacon, MD Pager #: [phone]

## 2018-10-26 NOTE — Telephone Encounter (Signed)
Cancelled appt per 12/24 sch message - unable to reschedule due to cap. - logged .  Left message for patient that appts for 12/27 have been cancelled and we will call him to set up new appts.

## 2018-10-26 NOTE — H&P (Signed)
Chief Complaint: Patient was seen in consultation today for port placement  Referring Physician(s): Brunetta Genera  Supervising Physician: Sandi Mariscal  Patient Status: [facility] - Out-pt  History of Present Illness: [name] is a 57 y.o. male with a past medical history significant for arthritis, HTN, gout, ETOH abuse (remote) and recently diagnosed high grade neuroendocrine carcinoma followed by Dr. Irene Limbo with plans to begin chemotherapy on 12/27. Patient previously seen in IR on 10/08/18 for liver lesion biopsy. Request has been made to IR for port placement today.  Patient reports left hip pain that comes and goes, it improves with movement and worsens with laying down or sitting for prolonged periods of time. He does use oxycodone for pain which helps as well. He also c/o dyspnea which is worse with exertion. He denies any other complaints. He understands requested procedure and wishes to proceed.   Past Medical History:  Diagnosis Date  . Arthritis    "knees; left hand"  . Excessive drinking alcohol    Admitted to 2-3 40oz beers every other night 02/2012  . Gout   . Heart murmur    Longstanding. 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 Hypertension   . Near syncope    05/2011 with tachypalpitations & with echo showing mild LVH, normal EF 55-60%, trivial AR    Past Surgical History:  Procedure Laterality Date  . NO PAST SURGERIES      Allergies: Patient has no known allergies.  Medications: Prior to Admission medications   Medication Sig Start Date End Date Taking? Authorizing Provider  oxyCODONE (OXY IR/ROXICODONE) 5 MG immediate release tablet Take 1-2 tablets (5-10 mg total) by mouth every 4 (four) hours as needed for severe pain. 10/19/18  Yes Brunetta Genera, MD  aspirin 325 MG tablet Take 325 mg by mouth daily.    [provider]  colchicine 0.6 MG tablet Take 1 tablet (0.6 mg total) by mouth See admin instructions. Take  1.2mg  (2 tablets) followed by .6mg  (1 tablet) 1 hour later. Patient not taking: Reported on 10/01/2018 03/27/18   Molpus, Jenny Reichmann, MD  lidocaine-prilocaine (EMLA) cream Apply to affected area once 10/20/18   Brunetta Genera, MD  LORazepam (ATIVAN) 0.5 MG tablet Take 1 tablet (0.5 mg total) by mouth every 6 (six) hours as needed (Nausea or vomiting). 10/20/18   Brunetta Genera, MD  ondansetron (ZOFRAN ODT) 4 MG disintegrating tablet Take 1 tablet (4 mg total) by mouth every 8 (eight) hours as needed for nausea or vomiting. 10/01/18   Francine Graven, DO  ondansetron (ZOFRAN) 8 MG tablet Take 1 tablet (8 mg total) by mouth 2 (two) times daily as needed for refractory nausea / vomiting. Start on day 3 after carboplatin chemo. 10/20/18   Brunetta Genera, MD  predniSONE (DELTASONE) 20 MG tablet Take 60 mg (three tablets) by mouth daily for 2 days, then 40mg  (two tablets) by mouth daily for 2 days, then 20mg  (one tablets) daily for 2 days Patient not taking: Reported on 10/01/2018 03/27/18   Molpus, Jenny Reichmann, MD  prochlorperazine (COMPAZINE) 10 MG tablet Take 1 tablet (10 mg total) by mouth every 6 (six) hours as needed (Nausea or vomiting). 10/20/18   Brunetta Genera, MD     Family History  Problem Relation Age of Onset  . Cancer Mother        Possibly pancreatic. Died at age 22    Social History   Socioeconomic History  . Marital  status: Married    Spouse name: Not on file  . Number of children: Not on file  . Years of education: Not on file  . Highest education level: Not on file  Occupational History  . Not on file  Social Needs  . Financial resource strain: Not on file  . Food insecurity:    Worry: Not on file    Inability: Not on file  . Transportation needs:    Medical: Not on file    Non-medical: Not on file  Tobacco Use  . Smoking status: Never Smoker  . Smokeless tobacco: Never Used  Substance and Sexual Activity  . Alcohol use: Not Currently    Comment:  states he has cut back/ every now and again  . Drug use: No  . Sexual activity: Yes  Lifestyle  . Physical activity:    Days per week: Not on file    Minutes per session: Not on file  . Stress: Not on file  Relationships  . Social connections:    Talks on phone: Not on file    Gets together: Not on file    Attends religious service: Not on file    Active member of club or organization: Not on file    Attends meetings of clubs or organizations: Not on file    Relationship status: Not on file  Other Topics Concern  . Not on file  Social History Narrative   Married. 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 Is from [place].      Review of Systems: A 12 point ROS discussed and pertinent positives are indicated in the HPI above.  All other systems are negative.  Review of Systems  Constitutional: Positive for fatigue. Negative for appetite change, chills and fever.  Respiratory: Positive for shortness of breath (with exertion). Negative for cough and wheezing.   Cardiovascular: Negative for chest pain.  Gastrointestinal: Positive for abdominal pain (comes and goes; none currently). Negative for blood in stool, diarrhea, nausea and vomiting.  Genitourinary: Negative for dysuria and hematuria.  Musculoskeletal: Positive for arthralgias (left hip pain).  Skin: Negative for rash and wound.  Neurological: Negative for dizziness, syncope and headaches.  Psychiatric/Behavioral: Negative for confusion.    Vital Signs: BP 125/71   Pulse 79   Temp 98.1 F (36.7 C) (Oral)   Resp 18   Ht 5\' 7"  (1.702 m)   Wt 157 lb 9.6 oz (71.5 kg)   SpO2 98%   BMI 24.68 kg/m   Physical Exam Vitals signs reviewed.  Constitutional:      General: He is not in acute distress.    Appearance: Normal appearance.  HENT:     Head: Normocephalic and atraumatic.  Cardiovascular:     Rate and Rhythm: Normal rate and regular rhythm.  Pulmonary:     Effort:  Pulmonary effort is normal.     Breath sounds: Normal breath sounds.  Abdominal:     General: There is no distension.     Palpations: Abdomen is soft.     Tenderness: There is no abdominal tenderness.  Skin:    General: Skin is warm and dry.  Neurological:     Mental Status: He is alert and oriented to person, place, and time.  Psychiatric:        Mood and Affect: Mood normal.        Behavior: Behavior normal.        Thought Content: Thought content normal.  Judgment: Judgment normal.      MD Evaluation Airway: WNL Heart: WNL Abdomen: WNL Chest/ Lungs: WNL ASA  Classification: 2 Mallampati/Airway Score: One   Imaging: Dg Chest 2 View  Result Date: 10/01/2018 CLINICAL DATA:  Patient c/o RUQ pain and weight loss x 1 month. Patient has lost approx 40 lbs in one month. Patient states he has nausea at times. Both images taken on full inspiration. Hx HTN EXAM: CHEST - 2 VIEW COMPARISON:  02/17/2017 FINDINGS: The heart size and mediastinal contours are within normal limits. Both lungs are clear. No pleural effusion or pneumothorax. The visualized skeletal structures are unremarkable. IMPRESSION: No active cardiopulmonary disease. Electronically Signed   By: Lajean Manes M.D.   On: 10/01/2018 19:28   Ct Abdomen Pelvis W Contrast  Result Date: 10/01/2018 CLINICAL DATA:  Right upper quadrant pain and 40 pound weight loss 1 month. Intermittent nausea. EXAM: CT ABDOMEN AND PELVIS WITH CONTRAST TECHNIQUE: Multidetector CT imaging of the abdomen and pelvis was performed using the standard protocol following bolus administration of intravenous contrast. CONTRAST:  143mL ISOVUE-300 IOPAMIDOL (ISOVUE-300) INJECTION 61% COMPARISON:  02/18/2017 and 07/10/2011 FINDINGS: Lower chest: Lung bases are normal. Tiny amount of pericardial fluid. Hepatobiliary: Stable 2.1 cm right liver cyst. Interval development of numerous ill-defined hypodense liver masses throughout the entire liver. Largest  definable mass over the lateral segment of the left lobe measuring 8.5 cm. Narrowing of the right portal vein. Gallbladder and biliary tree are normal. Pancreas: 1 cm hypodensity over the pancreatic tail not well seen on the previous noncontrast exams. Spleen: Several calcified splenic granulomas are present. Adrenals/Urinary Tract: Adrenal glands are normal. Kidneys are normal in size without hydronephrosis or nephrolithiasis. There is a 2.5 cm solid slightly enhancing mass over the superior pole of the right kidney concerning for renal cell carcinoma. Ureters and bladder are normal. Stomach/Bowel: Stomach and small bowel are normal. Appendix is normal. Mild fecal retention throughout the colon. Vascular/Lymphatic: Minimal calcified plaque over the abdominal aorta. No definite adenopathy. Reproductive: Normal. Other: Small amount of free fluid in the pelvis. Musculoskeletal: Unremarkable. IMPRESSION: 1. Interval development of numerous ill-defined hypodense masses throughout the entire liver compatible with metastatic disease. There is a 2.5 cm enhancing mass over the upper pole cortex of the right kidney suspicious for renal cell carcinoma. 2.  No acute findings in the abdomen/pelvis. 3. 1 cm hypodensity over the pancreatic tail not well seen on previous noncontrast exams. MR may be helpful for further evaluation on an elective basis. Electronically Signed   By: Marin Olp M.D.   On: 10/01/2018 21:54   Nm Pet Image Initial (pi) Skull Base To Thigh  Result Date: 10/15/2018 CLINICAL DATA:  Initial treatment strategy for numerous metastatic liver lesions. EXAM: NUCLEAR MEDICINE PET SKULL BASE TO THIGH TECHNIQUE: 8.1 mCi F-18 FDG was injected intravenously. Full-ring PET imaging was performed from the skull base to thigh after the radiotracer. CT data was obtained and used for attenuation correction and anatomic localization. Fasting blood glucose: 74 mg/dl COMPARISON:  CT scan 10/01/2018 FINDINGS: Mediastinal  blood pool activity: SUV max 1.48 NECK: No hypermetabolic lymph nodes in the neck. Incidental CT findings: none CHEST: No worrisome pulmonary lesion to suggest a primary lung neoplasm. No enlarged or hypermetabolic mediastinal or hilar lymph nodes. There are 2 sub 3 mm right upper lobe pulmonary nodules which are likely benign but attention on future scans is suggested. No enlarged or hypermetabolic supraclavicular or axillary lymph nodes. Incidental CT findings: none ABDOMEN/PELVIS: Diffuse  hepatic metastatic disease with innumerable lesions throughout the enlarged liver. SUV max is 9.62. Scattered splenic calcified granulomas but no splenic lesions or enlargement. No adrenal gland lesions or hypermetabolism. Upper pole right renal lesion is difficult to identify on this study and is better seen on the recent CT scan. No obvious hypermetabolism. MRI abdomen without and with contrast may be helpful for further evaluation of this lesion. This lesion was also present dating back to a CT scan from 2012 but has enlarged. I think it is unlikely the cause of the patient's metastatic disease. No colonic mass or hypermetabolism is identified. No enlarged or hypermetabolic mesenteric or retroperitoneal lymph nodes. Incidental CT findings: none SKELETON: Diffuse osseous metastatic disease involving the axial and appendicular skeleton. Left humeral head/neck lesion has an SUV max of 9.99. T11 lesion has a SUV max of 7.46. Left sacral lesion has a SUV max of 8.87 Left femoral head lesion has an SUV max of 4.86. Incidental CT findings: none IMPRESSION: 1. Diffuse hepatic and osseous metastatic disease without obvious primary neoplasm. 2. Upper pole right renal lesion is not definitely hypermetabolic and has been present since 2012. I think is unlikely the cause of the metastatic disease. Recommend liver biopsy for tissue diagnosis. If needed, an MRI of the abdomen without and with contrast may be helpful for further evaluation  of the renal lesion. 3. Two sub 3 mm right upper lobe pulmonary nodules are indeterminate. Electronically Signed   By: Marijo Sanes M.D.   On: 10/15/2018 17:05   US Biopsy (liver)  Result Date: 10/08/2018 INDICATION: Liver metastases, unknown primary EXAM: ULTRASOUND GUIDED CORE BIOPSY OF LEFT HEPATIC MASS MEDICATIONS: 1% LIDOCAINE LOCAL ANESTHESIA/SEDATION: Versed 2.0mg  IV; Fentanyl 164mcg IV; Moderate Sedation Time:  14 MINUTES The patient was continuously monitored during the procedure by the interventional radiology nurse under my direct supervision. FLUOROSCOPY TIME:  Fluoroscopy Time: NONE. COMPLICATIONS: None immediate. PROCEDURE: The procedure, risks, benefits, and alternatives were explained to the patient. Questions regarding the procedure were encouraged and answered. The patient understands and consents to the procedure. Previous imaging reviewed. Preliminary ultrasound performed. A left hepatic lesion was localized and marked. Under sterile conditions and local anesthesia, a 17 gauge coaxial guide needle was advanced from a subxiphoid anterior approach to the left hepatic lesion. Needle position confirmed with ultrasound. 18 gauge core biopsies obtained. Samples placed in formalin. Needle removed. Postprocedure imaging demonstrates no hemorrhage or hematoma. Patient tolerated biopsy well. FINDINGS: Imaging confirms needle placed in the left hepatic lesion for core biopsy IMPRESSION: Successful ultrasound left hepatic mass 18 gauge core biopsy Electronically Signed   By: Jerilynn Mages.  Shick M.D.   On: 10/08/2018 13:44   Vas [place] Lower Extremity Venous (dvt)  Result Date: 10/23/2018  Lower Venous Study Indications: Swelling in the left foot.  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 Anticoagulation: Aspirin. Performing Technologist: Guinevere Ferrari RVT  Examination Guidelines: A complete evaluation includes B-mode imaging, spectral Doppler, color  Doppler, and power Doppler as needed of all accessible portions of each vessel. Bilateral testing is considered an integral part of a complete examination. Limited examinations for reoccurring indications may be performed as noted.  Right Venous Findings: +---------+---------------+---------+-----------+----------+-------+          CompressibilityPhasicitySpontaneityPropertiesSummary +---------+---------------+---------+-----------+----------+-------+ CFV      Full           Yes      Yes                          +---------+---------------+---------+-----------+----------+-------+  SFJ      Full           Yes      Yes                          +---------+---------------+---------+-----------+----------+-------+ FV Prox  Full           Yes      Yes                          +---------+---------------+---------+-----------+----------+-------+ FV Mid   Full                                                 +---------+---------------+---------+-----------+----------+-------+ FV DistalFull           Yes      Yes                          +---------+---------------+---------+-----------+----------+-------+ PFV      Full                                                 +---------+---------------+---------+-----------+----------+-------+ POP      Full           Yes      Yes                          +---------+---------------+---------+-----------+----------+-------+ PTV      Full           Yes      Yes                          +---------+---------------+---------+-----------+----------+-------+ PERO     Full           Yes      Yes                          +---------+---------------+---------+-----------+----------+-------+ Gastroc  Full                                                 +---------+---------------+---------+-----------+----------+-------+ GSV      Full           Yes      Yes                           +---------+---------------+---------+-----------+----------+-------+ Rouleaux flow noted in the CFV, greater saphenous vein and popliteal vein. Incompetent valves noted in the proximal greater saphenous vein with possible superficial thrombus trying to form behind the valve flaps. Inguinal lymph nodes noted.  Left Venous Findings: +---------+---------------+---------+-----------+----------+-------+          CompressibilityPhasicitySpontaneityPropertiesSummary +---------+---------------+---------+-----------+----------+-------+ CFV      Full           Yes      Yes                          +---------+---------------+---------+-----------+----------+-------+  SFJ      Full           Yes      Yes                          +---------+---------------+---------+-----------+----------+-------+ FV Prox  Full           Yes      Yes                          +---------+---------------+---------+-----------+----------+-------+ FV Mid   Full                                                 +---------+---------------+---------+-----------+----------+-------+ FV DistalFull           Yes      Yes                          +---------+---------------+---------+-----------+----------+-------+ PFV      Full                                                 +---------+---------------+---------+-----------+----------+-------+ POP      Full           Yes      Yes                          +---------+---------------+---------+-----------+----------+-------+ PTV      Full           Yes      Yes                          +---------+---------------+---------+-----------+----------+-------+ PERO     Full           Yes      Yes                          +---------+---------------+---------+-----------+----------+-------+ Gastroc  Full                                                 +---------+---------------+---------+-----------+----------+-------+ GSV      Full            Yes      Yes                          +---------+---------------+---------+-----------+----------+-------+ Rouleaux flow noted in the CFV, femoral vein, profunda vein, gastrocnemius and popliteal vein. Incompetent valves noted in the proximal femoral vein. Unable to determine if thrombus is trying to form behind the valve flaps. Inguinal lymph nodes noted.   Findings reported to Dr. Debara Pickett, Doctor of the Day, who let the patient go home at 1:05  Summary: Right: No evidence of deep vein thrombosis in the lower extremity. No indirect evidence of obstruction proximal to the inguinal ligament. No cystic structure found in the popliteal fossa. Rouleaux flow noted in the CFV, greater  saphenous vein and popliteal vein. Incompetent valves noted in the proximal greater saphenous vein with possible superficial thrombus trying to form behind the valve flaps. Ultrasound characteristics of enlarged lymph nodes are noted in the groin. Left: No evidence of deep vein thrombosis in the lower extremity. No indirect evidence of obstruction proximal to the inguinal ligament. No cystic structure found in the popliteal fossa. Rouleaux flow noted in the CFV, femoral vein, profunda vein, gastrocnemius and popliteal vein. Incompetent valves noted in the proximal femoral vein. Unable to determine if thrombus is trying to form behind the valve flaps. Ultrasound characteristics of enlarged lymph nodes noted in the groin.  *See table(s) above for measurements and observations. CC: Dr. Lia Hopping, Oncologist Suggest repeat Lower Extremity Venous Duplex. Electronically signed by Jenkins Rouge MD on 10/23/2018 at 3:25:17 PM.    Final     Labs:  CBC: Recent Labs    10/06/18 1458 10/08/18 1053 10/19/18 1412 10/26/18 0758  WBC 6.9 7.7 7.6 7.4  HGB 13.2 13.7 13.2 13.3  HCT 40.4 43.0 40.4 41.2  PLT 156 154 143* 148*    COAGS: Recent Labs    10/01/18 1925 10/08/18 1053 10/26/18 0758  INR 1.11 1.14 1.11  APTT  --  35  --      BMP: Recent Labs    10/01/18 1656 10/06/18 1458 10/19/18 1412  NA 133* 134* 133*  K 4.6 4.6 4.7  CL 101 101 99  CO2 21* 22 23  GLUCOSE 96 96 109*  BUN 21* 13 16  CALCIUM 9.4 9.6 9.5  CREATININE 1.10 0.89 1.01  GFRNONAA >60 >60 >60  GFRAA >60 >60 >60    LIVER FUNCTION TESTS: Recent Labs    10/01/18 1656 10/06/18 1458 10/19/18 1412  BILITOT 3.4* 2.3* 2.1*  AST 90* 77* 61*  ALT 71* 63* 44  ALKPHOS 327* 392* 316*  PROT 7.6 7.6 7.7  ALBUMIN 3.2* 3.0* 2.9*    TUMOR MARKERS: No results for input(s): AFPTM, CEA, CA199, CHROMGRNA in the last 8760 hours.  Assessment and Plan:  Patient with recently diagnosed high grade neuroendocrine tumor followed by Dr. Irene Limbo with plans to start chemotherapy on 12/27. Request has been made to IR for port placement today.  Patient has been NPO since 8 pm last evening, he does not take blood thinning medications, he did not take any medications this morning. Afebrile, WBC 7.4, hgb 13.3, plt 148, INR 1.11.  Risks and benefits of image guided port-a-catheter placement was discussed with the patient including, but not limited to bleeding, infection, pneumothorax, or fibrin sheath development and need for additional procedures.  All of the patient's questions were answered, patient is agreeable to proceed.  Consent signed and in chart.  Thank you for this interesting consult.  I greatly enjoyed meeting [name] and look forward to participating in their care.  A copy of this report was sent to the requesting provider on this date.  Electronically Signed: Joaquim Nam, PA-C 10/26/2018, 8:27 AM   I spent a total of  15 Minutes in face to face in clinical consultation, greater than 50% of which was counseling/coordinating care for port placement.

## 2018-10-28 ENCOUNTER — Ambulatory Visit (HOSPITAL_COMMUNITY): Payer: Self-pay

## 2018-10-28 ENCOUNTER — Telehealth: Payer: Self-pay | Admitting: Hematology

## 2018-10-28 NOTE — Telephone Encounter (Signed)
Scheduled appt per 12/24 sch message. Left message for patient regarding appts for 1/2,1/3 and 1/4

## 2018-10-29 ENCOUNTER — Inpatient Hospital Stay: Payer: Medicaid Other

## 2018-10-31 ENCOUNTER — Emergency Department (HOSPITAL_COMMUNITY): Payer: Medicaid Other

## 2018-10-31 ENCOUNTER — Emergency Department (HOSPITAL_COMMUNITY)
Admission: EM | Admit: 2018-10-31 | Discharge: 2018-10-31 | Disposition: A | Discharge status: Home / Self Care | Payer: Medicaid Other | Attending: Emergency Medicine | Admitting: Emergency Medicine

## 2018-10-31 ENCOUNTER — Other Ambulatory Visit: Payer: Self-pay

## 2018-10-31 ENCOUNTER — Encounter (HOSPITAL_COMMUNITY): Payer: Self-pay | Admitting: Emergency Medicine

## 2018-10-31 DIAGNOSIS — I10 Essential (primary) hypertension: Secondary | ICD-10-CM | POA: Insufficient documentation

## 2018-10-31 DIAGNOSIS — Z853 Personal history of malignant neoplasm of breast: Secondary | ICD-10-CM | POA: Diagnosis not present

## 2018-10-31 DIAGNOSIS — Z79899 Other long term (current) drug therapy: Secondary | ICD-10-CM | POA: Insufficient documentation

## 2018-10-31 DIAGNOSIS — R0602 Shortness of breath: Secondary | ICD-10-CM

## 2018-10-31 DIAGNOSIS — R1010 Upper abdominal pain, unspecified: Secondary | ICD-10-CM | POA: Insufficient documentation

## 2018-10-31 HISTORY — DX: Malignant (primary) neoplasm, unspecified: C80.1

## 2018-10-31 LAB — COMPREHENSIVE METABOLIC PANEL
ALT: 32 U/L (ref 0–44)
AST: 54 U/L — ABNORMAL HIGH (ref 15–41)
Albumin: 3.1 g/dL — ABNORMAL LOW (ref 3.5–5.0)
Alkaline Phosphatase: 230 U/L — ABNORMAL HIGH (ref 38–126)
Anion gap: 10 (ref 5–15)
BUN: 19 mg/dL (ref 6–20)
CO2: 23 mmol/L (ref 22–32)
Calcium: 8.9 mg/dL (ref 8.9–10.3)
Chloride: 98 mmol/L (ref 98–111)
Creatinine, Ser: 0.87 mg/dL (ref 0.61–1.24)
GFR calc Af Amer: >60 mL/min (ref >60)
GFR calc non Af Amer: >60 mL/min (ref >60)
Glucose, Bld: 102 mg/dL — ABNORMAL HIGH (ref 70–99)
Potassium: 4.8 mmol/L (ref 3.5–5.1)
Sodium: 131 mmol/L — ABNORMAL LOW (ref 135–145)
Total Bilirubin: 1.9 mg/dL — ABNORMAL HIGH (ref 0.3–1.2)
Total Protein: 7.5 g/dL (ref 6.5–8.1)

## 2018-10-31 LAB — URINALYSIS, ROUTINE W REFLEX MICROSCOPIC
BILIRUBIN URINE: NEGATIVE
Glucose, UA: NEGATIVE mg/dL
Hgb urine dipstick: NEGATIVE
Ketones, ur: NEGATIVE mg/dL
Leukocytes, UA: NEGATIVE
Nitrite: NEGATIVE
Protein, ur: NEGATIVE mg/dL
Specific Gravity, Urine: 1.015 (ref 1.005–1.030)
pH: 6 (ref 5.0–8.0)

## 2018-10-31 LAB — CBC
HCT: 40.9 % (ref 39.0–52.0)
Hemoglobin: 13 g/dL (ref 13.0–17.0)
MCH: 27.5 pg (ref 26.0–34.0)
MCHC: 31.8 g/dL (ref 30.0–36.0)
MCV: 86.7 fL (ref 80.0–100.0)
Platelets: 163 10*3/uL (ref 150–400)
RBC: 4.72 MIL/uL (ref 4.22–5.81)
RDW: 13.6 % (ref 11.5–15.5)
WBC: 7.8 10*3/uL (ref 4.0–10.5)
nRBC: 0 % (ref 0.0–0.2)

## 2018-10-31 LAB — I-STAT TROPONIN, ED: Troponin i, poc: 0 ng/mL (ref 0.00–0.08)

## 2018-10-31 LAB — LIPASE, BLOOD: Lipase: 32 U/L (ref 11–51)

## 2018-10-31 MED ORDER — SODIUM CHLORIDE (PF) 0.9 % IJ SOLN
INTRAMUSCULAR | Status: AC
  Filled 2018-10-31: qty 50

## 2018-10-31 MED ORDER — IOPAMIDOL (ISOVUE-370) INJECTION 76%
100.0000 mL | Freq: Once | INTRAVENOUS | Status: AC | PRN
  Administered 2018-10-31: 100 mL via INTRAVENOUS

## 2018-10-31 MED ORDER — IOPAMIDOL (ISOVUE-370) INJECTION 76%
INTRAVENOUS | Status: AC
  Filled 2018-10-31: qty 100

## 2018-10-31 MED ORDER — MORPHINE SULFATE (PF) 4 MG/ML IV SOLN
4.0000 mg | Freq: Once | INTRAVENOUS | Status: AC
  Administered 2018-10-31: 4 mg via INTRAVENOUS
  Filled 2018-10-31: qty 1

## 2018-10-31 NOTE — Discharge Instructions (Signed)
You have been seen today for abdominal pain and shortness of breath. Please read and follow all provided instructions.   1. Medications: usual home medications 2. Treatment: rest, drink plenty of fluids 3. Follow Up: Please call and schedule a follow up appointment with Dr. Irene Limbo (oncologist) in the next 2 days. Please follow up with your primary doctor in 7 days for discussion of your diagnoses and further evaluation after today's visit; if you do not have a primary care doctor use the resource guide provided to find one; Please return to the ER for any new or worsening symptoms. Please obtain all of your results from medical records or have your doctors office obtain the results - share them with your doctor - you should be seen at your doctors office. Call today to arrange your follow up.   You should return to the ER if you develop severe or worsening symptoms.   Emergency Department Resource Guide 1) Find a Doctor and Pay Out of Pocket Although you won't have to find out who is covered by your insurance plan, it is a good idea to ask around and get recommendations. You will then need to call the office and see if the doctor you have chosen will accept you as a new patient and what types of options they offer for patients who are self-pay. Some doctors offer discounts or will set up payment plans for their patients who do not have insurance, but you will need to ask so you aren't surprised when you get to your appointment.  2) Contact Your Local Health Department Not all health departments have doctors that can see patients for sick visits, but many do, so it is worth a call to see if yours does. If you don't know where your local health department is, you can check in your phone book. The CDC also has a tool to help you locate your state's health department, and many state websites also have listings of all of their local health departments.  3) Find a [facility] If your illness is not likely  to be very severe or complicated, you may want to try a walk in clinic. These are popping up all over the country in pharmacies, drugstores, and shopping centers. They're usually staffed by nurse practitioners or physician assistants that have been trained to treat common illnesses and complaints. They're usually fairly quick and inexpensive. However, if you have serious medical issues or chronic medical problems, these are probably not your best option.  No Primary Care Doctor: Call Health Connect at  [phone] - they can help you locate a primary care doctor that  accepts your insurance, provides certain services, etc. Physician Referral [phone]  Emergency Department Resource Guide 1) Find a Doctor and Pay Out of Pocket Although you won't have to find out who is covered by your insurance plan, it is a good idea to ask around and get recommendations. You will then need to call the office and see if the doctor you have chosen will accept you as a new patient and what types of options they offer for patients who are self-pay. Some doctors offer discounts or will set up payment plans for their patients who do not have insurance, but you will need to ask so you aren't surprised when you get to your appointment.  2) Contact Your Local Health Department Not all health departments have doctors that can see patients for sick visits, but many do, so it is worth a call  to see if yours does. If you don't know where your local health department is, you can check in your phone book. The CDC also has a tool to help you locate your state's health department, and many state websites also have listings of all of their local health departments.  3) Find a [facility] If your illness is not likely to be very severe or complicated, you may want to try a walk in clinic. These are popping up all over the country in pharmacies, drugstores, and shopping centers. They're usually staffed by nurse  practitioners or physician assistants that have been trained to treat common illnesses and complaints. They're usually fairly quick and inexpensive. However, if you have serious medical issues or chronic medical problems, these are probably not your best option.  No Primary Care Doctor: Call Health Connect at  [phone] - they can help you locate a primary care doctor that  accepts your insurance, provides certain services, etc. Physician Referral Service- [phone]  Chronic Pain Problems: Organization         Address  Phone   Notes  [facility]  [phone] Patients need to be referred by their primary care doctor.   Medication Assistance: Organization         Address  Phone   Notes  [facility] Medication [facility] [address] [place], [place] [address] [phone] --Must be a resident of [facility] -- Must have NO insurance coverage whatsoever (no Medicaid/ Medicare, etc.) -- The pt. MUST have a primary care doctor that directs their care regularly and follows them in the community   [facility]  [phone]   [employer]  [phone]    Agencies that provide inexpensive medical care: Organization         Address  Phone   Notes  [place]  [phone]   [facility]    [phone]   [facility] [place], [place] [address] [phone]   [place] [address] [place] [phone]   Planned Parenthood    [phone]   [facility]    [phone]   [place] and [place] [address], [place] Phone:  [phone], Fax:  [phone] Hours of Operation:  9 am - 6 pm, M-F.  Also accepts Medicaid/Medicare and self-pay.  [facility] for [place], [address], [place] Phone: [phone], Fax: [phone]. Hours of Operation:  8:30 am - 5:30  pm, M-F.  Also accepts Medicaid and self-pay.  [facility] [address], [place] Phone: [phone]   [place], [place], [place] [phone], Ext. [phone] Mondays & Thursdays: 7-9 AM.  First 15 patients are seen on a first come, first serve basis.    [place] Providers:  Organization         Address  Phone   Notes  [facility] [address], [place] [phone] Also accepts self-pay patients.  [place], [place]  [phone]   [place], [address], [place] [phone]   [place] [address], [place] [phone]   Lucianne Lei [address], [place]   [phone] Only accepts [place] Access Medicaid patients after they have their name applied to their card.   Self-Pay (no insurance) in [facility]:  Organization         Address  Phone   Notes  Sickle Cell Patients, [facility] [place] [phone]   [facility] [place] [phone]   [facility] [place]  [place], [place], [place] [phone]   [facility]/Dr. Osei-Bonsu  [address], [place] or [address], [place] [phone] Phone number for both [place] and [place] locations is the same.  [facility] [address] [place] [phone]   [facility] [address], [place] or [address] [phone]   [facility] [address], [place] [phone], phone; [phone], fax Sees patients 1st and 3rd Saturday of every month.  Must not qualify for public or private insurance (i.e. Medicaid, Medicare, [place] Health Choice,  Veterans' Benefits)  Household income should be no more than 200% of the poverty level The clinic cannot treat you if you are pregnant or think you are pregnant  Sexually transmitted diseases are not treated at the clinic.

## 2018-10-31 NOTE — ED Notes (Signed)
Patient transported to CT 

## 2018-10-31 NOTE — ED Notes (Signed)
Pt cannot use restroom at this time, aware urine specimen is needed. Urinal at bedside

## 2018-10-31 NOTE — ED Triage Notes (Signed)
Pt reports abd pains with SOB that started this morning. Denies n/v/d.

## 2018-10-31 NOTE — ED Provider Notes (Signed)
[facility] DEPT Provider Note   CSN: [id number] Arrival date & time: 10/31/18  1415     History   Chief Complaint Chief Complaint  Patient presents with  . Abdominal Pain  . Shortness of Breath    HPI [name] is a 57 y.o. male with a PMH of HTN, alcohol abuse, and metastatic neuroendocrine cancer presenting with constant non radiating upper abdominal pain and shortness of breath onset today at 1:30pm. Patient describes pain as constant and states he was watching TV when the pain started. Patient denies any nausea, vomiting, diarrhea, or constipation. Patient denies chest pain, leg edema, or diaphoresis. Patient states he took oxycodone without relief. Patient states he stopped drinking alcohol 1.5 years ago. Patient denies drug or tobacco use. Patient had a CT on 11/29 and CT revealed numerous ill-defined hypodense masses throughout the entire liver and a mass over the upper pole cortex of the right kidney. Patient states he had similar symptoms 4-5 months ago, but states symptoms are worse today. Patient reports he had a port inserted on 12/24. Patient reports he Patient reports oncologist is Dr. Irene Limbo and he will start chemotherapy on 01/02. Patient also reports left leg edema and states patient was evaluated on 12/20 with a negative DVT ultrasound study. Patient states left leg edema has significantly improved and denies any pain.   HPI  Past Medical History:  Diagnosis Date  . Arthritis    "knees; left hand"  . Cancer ([place])    leukemia  . Excessive drinking alcohol    Admitted to 2-3 40oz beers every other night 02/2012  . Gout   . Heart murmur    Longstanding. 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 Hypertension   . Near syncope    05/2011 with tachypalpitations & with echo showing mild LVH, normal EF 55-60%, trivial AR    Patient Active Problem List   Diagnosis Date Noted  . Bone metastases ([place]) 10/20/2018  .  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 Counseling regarding advance care planning and goals of care 10/20/2018  . Gout 03/21/2016  . Chest pain 02/18/2012  . Alcohol abuse 02/18/2012  . Syncope 05/28/2011  . Palpitations 05/28/2011  . Murmur 05/28/2011    Past Surgical History:  Procedure Laterality Date  . IR IMAGING GUIDED PORT INSERTION  10/26/2018  . NO PAST SURGERIES          Home Medications    Prior to Admission medications   Medication Sig Start Date End Date Taking? Authorizing Provider  lidocaine-prilocaine (EMLA) cream Apply to affected area once 10/20/18  Yes Kale, Cloria Spring, MD  LORazepam (ATIVAN) 0.5 MG tablet Take 1 tablet (0.5 mg total) by mouth every 6 (six) hours as needed (Nausea or vomiting). 10/20/18  Yes Brunetta Genera, MD  ondansetron (ZOFRAN ODT) 4 MG disintegrating tablet Take 1 tablet (4 mg total) by mouth every 8 (eight) hours as needed for nausea or vomiting. 10/01/18  Yes Francine Graven, DO  ondansetron (ZOFRAN) 8 MG tablet Take 1 tablet (8 mg total) by mouth 2 (two) times daily as needed for refractory nausea / vomiting. Start on day 3 after carboplatin chemo. 10/20/18  Yes Brunetta Genera, MD  oxyCODONE (OXY IR/ROXICODONE) 5 MG immediate release tablet Take 1-2 tablets (5-10 mg total) by mouth every 4 (four) hours as needed for severe pain. 10/19/18  Yes Brunetta Genera, MD  prochlorperazine (  COMPAZINE) 10 MG tablet Take 1 tablet (10 mg total) by mouth every 6 (six) hours as needed (Nausea or vomiting). 10/20/18  Yes Brunetta Genera, MD  colchicine 0.6 MG tablet Take 1 tablet (0.6 mg total) by mouth See admin instructions. Take 1.2mg  (2 tablets) followed by .6mg  (1 tablet) 1 hour later. Patient not taking: Reported on 10/01/2018 03/27/18   Molpus, Jenny Reichmann, MD  predniSONE (DELTASONE) 20 MG tablet Take 60 mg (three tablets) by mouth daily for 2 days, then 40mg  (two tablets) by  mouth daily for 2 days, then 20mg  (one tablets) daily for 2 days Patient not taking: Reported on 10/01/2018 03/27/18   Molpus, Jenny Reichmann, MD    Family History Family History  Problem Relation Age of Onset  . Cancer Mother        Possibly pancreatic. Died at age 27    Social History Social History   Tobacco Use  . Smoking status: Never Smoker  . Smokeless tobacco: Never Used  Substance Use Topics  . Alcohol use: Not Currently    Comment: states he has cut back/ every now and again  . Drug use: No     Allergies   Patient has no known allergies.   Review of Systems Review of Systems  Constitutional: Negative for activity change, appetite change, chills and fever.  HENT: Negative for congestion, rhinorrhea and sore throat.   Eyes: Negative for visual disturbance.  Respiratory: Positive for shortness of breath. Negative for cough.   Cardiovascular: Positive for leg swelling. Negative for chest pain.  Gastrointestinal: Positive for abdominal pain. Negative for constipation, diarrhea, nausea and vomiting.  Endocrine: Negative for polydipsia, polyphagia and polyuria.  Genitourinary: Negative for dysuria, flank pain and frequency.  Musculoskeletal: Negative for back pain.  Skin: Negative for rash.  Psychiatric/Behavioral: The patient is not nervous/anxious.      Physical Exam Updated Vital Signs BP 113/67   Pulse 80   Temp 98.4 F (36.9 C) (Oral)   Resp 13   SpO2 98%   Physical Exam Vitals signs and nursing note reviewed.  Constitutional:      General: He is not in acute distress.    Appearance: He is well-developed. He is not diaphoretic.  HENT:     Head: Normocephalic and atraumatic.  Neck:     Musculoskeletal: Normal range of motion and neck supple.  Cardiovascular:     Rate and Rhythm: Normal rate and regular rhythm.     Heart sounds: Normal heart sounds. No murmur. No friction rub. No gallop.   Pulmonary:     Effort: Pulmonary effort is normal. No accessory  muscle usage or respiratory distress.     Breath sounds: Normal breath sounds. No wheezing or rales.  Abdominal:     General: Bowel sounds are normal. There is no distension.     Palpations: Abdomen is soft. Abdomen is not rigid. There is mass.     Tenderness: There is abdominal tenderness in the right upper quadrant, epigastric area and left upper quadrant. There is no guarding or rebound.     Hernia: No hernia is present.  Musculoskeletal: Normal range of motion.     Right lower leg: He exhibits no tenderness. No edema.     Left lower leg: He exhibits no tenderness. Edema (Edema noted on lateral aspect of left ankle. ) present.  Skin:    General: Skin is warm.     Findings: No rash.  Neurological:     Mental Status: He is alert  and oriented to person, place, and time.      ED Treatments / Results  Labs (all labs ordered are listed, but only abnormal results are displayed) Labs Reviewed  COMPREHENSIVE METABOLIC PANEL - Abnormal; Notable for the following components:      Result Value   Sodium 131 (*)    Glucose, Bld 102 (*)    Albumin 3.1 (*)    AST 54 (*)    Alkaline Phosphatase 230 (*)    Total Bilirubin 1.9 (*)    All other components within normal limits  LIPASE, BLOOD  CBC  URINALYSIS, ROUTINE W REFLEX MICROSCOPIC  I-STAT TROPONIN, ED   ALT  Date Value Ref Range Status  10/31/2018 32 0 - 44 U/L Final  10/19/2018 44 0 - 44 U/L Final  10/06/2018 63 (H) 0 - 44 U/L Final  10/01/2018 71 (H) 0 - 44 U/L Final  02/18/2012 14 0 - 53 U/L Final  02/17/2012 16 0 - 53 U/L Final    AST  Date Value Ref Range Status  10/31/2018 54 (H) 15 - 41 U/L Final  10/19/2018 61 (H) 15 - 41 U/L Final  10/06/2018 77 (H) 15 - 41 U/L Final  10/01/2018 90 (H) 15 - 41 U/L Final  02/18/2012 12 0 - 37 U/L Final  02/17/2012 15 0 - 37 U/L Final    Comment:    SLIGHT HEMOLYSIS   EKG EKG Interpretation  Date/Time:  Sunday October 31 2018 14:36:11 EST Ventricular Rate:  77 PR  Interval:    QRS Duration: 79 QT Interval:  370 QTC Calculation: 419 R Axis:   55 Text Interpretation:  Sinus rhythm No significant change was found Confirmed by Jola Schmidt [phone]) on 10/31/2018 8:10:06 PM   Radiology Dg Chest 2 View  Result Date: 10/31/2018 CLINICAL DATA:  Upper abdominal pain. Shortness of breath. Metastatic small cell carcinoma to the liver and bones. EXAM: CHEST - 2 VIEW COMPARISON:  Chest x-ray dated 10/01/2018 and PET-CT dated 10/15/2018 FINDINGS: New power port in place. The tip is at the cavoatrial junction. Heart size and vascularity are normal. Lungs are clear. No discrete bone abnormality. No effusions. IMPRESSION: No active cardiopulmonary disease. Electronically Signed   By: Lorriane Shire M.D.   On: 10/31/2018 16:32   Ct Angio Chest Pe W/cm &/or Wo Cm  Result Date: 10/31/2018 CLINICAL DATA:  Abdominal pain and dyspnea starting this morning. EXAM: CT ANGIOGRAPHY CHEST CT ABDOMEN AND PELVIS WITH CONTRAST TECHNIQUE: Multidetector CT imaging of the chest was performed using the standard protocol during bolus administration of intravenous contrast. Multiplanar CT image reconstructions and MIPs were obtained to evaluate the vascular anatomy. Multidetector CT imaging of the abdomen and pelvis was performed using the standard protocol during bolus administration of intravenous contrast. CONTRAST:  169mL ISOVUE-370 IOPAMIDOL (ISOVUE-370) INJECTION 76% COMPARISON:  PET CT 10/15/2018, CT abdomen and pelvis 10/01/2018 FINDINGS: CTA CHEST FINDINGS Cardiovascular: Satisfactory opacification of the pulmonary arteries to the segmental level. No evidence of pulmonary embolism. Normal heart size. No pericardial effusion. Mediastinum/Nodes: Common arterial branch point of the right brachiocephalic and left common carotid arteries. No aneurysm or dissection. Nonaneurysmal thoracic aorta. No dissection is noted. No mediastinal or hilar lymphadenopathy. The CT appearance of the esophagus  is unremarkable. Midline patent trachea. Patent mainstem bronchi. Lungs/Pleura: No dominant mass or pulmonary consolidation. Redemonstration of tiny 3 mm or less right upper lobe pulmonary nodules as seen on PET-CT. No significant progression. No effusion. Minimal right basilar atelectasis. No pneumothorax. Musculoskeletal:  No aggressive osseous lesions. No acute fracture. Port catheter is noted overlying the anterior right chest wall leads projecting into right atrium. Review of the MIP images confirms the above findings. CT ABDOMEN and PELVIS FINDINGS Hepatobiliary: Redemonstration of too numerous to count hypodense liver masses consistent with metastasis. Gallbladder is unremarkable and free of stones. There is a stable cyst in the right hepatic lobe measuring 1.8 cm. Pancreas: 12 mm hypodense lesion in the pancreatic tail is only minimally enlarged from 10 mm previously. No ductal dilatation or inflammation. Spleen: No splenomegaly. Scattered calcified granulomata are noted. Adrenals/Urinary Tract: Normal bilateral adrenal glands. Indeterminate mass arising off the upper pole the right kidney measuring up to 1.7 cm in diameter is without significant progression. No additional lesions are noted. No nephrolithiasis nor hydroureteronephrosis. Stomach/Bowel: The stomach is decompressed in appearance. No bowel obstruction or inflammation. Moderate stool retention is seen within the colon. No annular constricting lesions are identified.Normal caliber appendix with appendicular with measuring 6 mm is identified. Vascular/Lymphatic: Atherosclerosis of the abdominal aorta without aneurysm. Patent branch vessels. The splenic and portal veins are patent. Reproductive: Normal size prostate. Other: Trace free fluid in the pelvis. Mild soft tissue anasarca. Musculoskeletal: No aggressive osteo lytic or blastic lesion. 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 Small sclerotic focus in the left iliac bone is  indeterminate stable and more likely represents a bone island. Review of the MIP images confirms the above findings. IMPRESSION: 1. No acute pulmonary embolus, aortic aneurysm or dissection. 2. Stable tiny 3 mm or less right upper lobe pulmonary nodules. 3. Redemonstration of too numerous to count hypodense liver masses consistent with metastasis. 4. Indeterminate mass arising off the upper pole the right kidney measuring up to 1.7 cm in diameter without significant progression. 5. Indeterminate 12 mm hypodense lesion in the pancreatic tail only minimally enlarged from 10 mm previously. 6. Atherosclerosis of the abdominal aorta without aneurysm or dissection. Electronically Signed   By: Ashley Royalty M.D.   On: 10/31/2018 18:16   Ct Abdomen Pelvis W Contrast  Result Date: 10/31/2018 CLINICAL DATA:  Abdominal pain and dyspnea starting this morning. EXAM: CT ANGIOGRAPHY CHEST CT ABDOMEN AND PELVIS WITH CONTRAST TECHNIQUE: Multidetector CT imaging of the chest was performed using the standard protocol during bolus administration of intravenous contrast. Multiplanar CT image reconstructions and MIPs were obtained to evaluate the vascular anatomy. Multidetector CT imaging of the abdomen and pelvis was performed using the standard protocol during bolus administration of intravenous contrast. CONTRAST:  170mL ISOVUE-370 IOPAMIDOL (ISOVUE-370) INJECTION 76% COMPARISON:  PET CT 10/15/2018, CT abdomen and pelvis 10/01/2018 FINDINGS: CTA CHEST FINDINGS Cardiovascular: Satisfactory opacification of the pulmonary arteries to the segmental level. No evidence of pulmonary embolism. Normal heart size. No pericardial effusion. Mediastinum/Nodes: Common arterial branch point of the right brachiocephalic and left common carotid arteries. No aneurysm or dissection. Nonaneurysmal thoracic aorta. No dissection is noted. No mediastinal or hilar lymphadenopathy. The CT appearance of the esophagus is unremarkable. Midline patent trachea.  Patent mainstem bronchi. Lungs/Pleura: No dominant mass or pulmonary consolidation. Redemonstration of tiny 3 mm or less right upper lobe pulmonary nodules as seen on PET-CT. No significant progression. No effusion. Minimal right basilar atelectasis. No pneumothorax. Musculoskeletal: No aggressive osseous lesions. No acute fracture. Port catheter is noted overlying the anterior right chest wall leads projecting into right atrium. Review of the MIP images confirms the above findings. CT ABDOMEN and PELVIS FINDINGS Hepatobiliary: Redemonstration of too numerous to count hypodense liver masses consistent with  metastasis. Gallbladder is unremarkable and free of stones. There is a stable cyst in the right hepatic lobe measuring 1.8 cm. Pancreas: 12 mm hypodense lesion in the pancreatic tail is only minimally enlarged from 10 mm previously. No ductal dilatation or inflammation. Spleen: No splenomegaly. Scattered calcified granulomata are noted. Adrenals/Urinary Tract: Normal bilateral adrenal glands. Indeterminate mass arising off the upper pole the right kidney measuring up to 1.7 cm in diameter is without significant progression. No additional lesions are noted. No nephrolithiasis nor hydroureteronephrosis. Stomach/Bowel: The stomach is decompressed in appearance. No bowel obstruction or inflammation. Moderate stool retention is seen within the colon. No annular constricting lesions are identified.Normal caliber appendix with appendicular with measuring 6 mm is identified. Vascular/Lymphatic: Atherosclerosis of the abdominal aorta without aneurysm. Patent branch vessels. The splenic and portal veins are patent. Reproductive: Normal size prostate. Other: Trace free fluid in the pelvis. Mild soft tissue anasarca. Musculoskeletal: No aggressive osteo lytic or blastic lesion. 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 Small sclerotic focus in the left iliac bone is indeterminate stable and more likely  represents a bone island. Review of the MIP images confirms the above findings. IMPRESSION: 1. No acute pulmonary embolus, aortic aneurysm or dissection. 2. Stable tiny 3 mm or less right upper lobe pulmonary nodules. 3. Redemonstration of too numerous to count hypodense liver masses consistent with metastasis. 4. Indeterminate mass arising off the upper pole the right kidney measuring up to 1.7 cm in diameter without significant progression. 5. Indeterminate 12 mm hypodense lesion in the pancreatic tail only minimally enlarged from 10 mm previously. 6. Atherosclerosis of the abdominal aorta without aneurysm or dissection. Electronically Signed   By: Ashley Royalty M.D.   On: 10/31/2018 18:16    Procedures Procedures (including critical care time)  Medications Ordered in ED Medications  sodium chloride (PF) 0.9 % injection (0 mLs  Hold 10/31/18 1726)  iopamidol (ISOVUE-370) 76 % injection (  Hold 10/31/18 1726)  morphine 4 MG/ML injection 4 mg (4 mg Intravenous Given 10/31/18 1650)  iopamidol (ISOVUE-370) 76 % injection 100 mL (100 mLs Intravenous Contrast Given 10/31/18 1722)     Initial Impression / Assessment and Plan / ED Course  I have reviewed the triage vital signs and the nursing notes.  Pertinent labs & imaging results that were available during my care of the patient were reviewed by me and considered in my medical decision making (see chart for details).  Clinical Course as of Nov 01 2023  Sun Oct 31, 2018  1654 CXR reveals no active cardiopulmonary disease.  DG Chest 2 View [AH]  1655 Lipase is within normal limits. Do not suspect pancreatitis at this time.   Lipase: 32 [AH]  1655 CBC is unremarkable.   CBC [AH]  1931 CT reveals no acute pulmonary embolus, aortic aneurysm or dissection. Stable 3 mm or less right upper lobe pulmonary nodules. Stable multiple hypodense liver masses consistent with metastasis. Indeterminate mass arising off the upper pole the right kidney measuring  up to 1.7 cm in diameter without significant progression. Indeterminate 12 mm hypodense lesion in the pancreatic tail only minimally enlarged from 10 mm previously. Atherosclerosis of the abdominal aorta without aneurysm or dissection.    CT Angio Chest PE W/Cm &/Or Wo Cm [AH]  1950 Alkaline phosphate is elevated, however, it has improved since 12/17.   Alkaline Phosphatase(!): 230 [AH]    Clinical Course User Index [AH] Darlin Drop P, PA-C   Pt presents with upper abdominal pain  and shortness of breath. Suspect symptoms are likely due to multiple abdominal masses due metastatic neuroendocrine cancer. Pancreatic tail lesion has minimally enlarged since last CT. CTA was negative for PE. Pain has been well controlled in the ER. Patient has been stable while in the ER. Will discharge patient to home. Advised patient to call and follow up with oncologist in 2 days. Instructed patient to return if he experiences new or worsening symptoms. Advised patient to continue taking oxycodone as prescribed for pain. Patient states he understands and agrees with plan.   Findings and plan of care discussed with supervising physician Dr. Venora Maples.   Final Clinical Impressions(s) / ED Diagnoses   Final diagnoses:  Pain of upper abdomen  Shortness of breath    ED Discharge Orders    None       Julienne Kass 10/31/18 2025    Jola Schmidt, MD 10/31/18 2317

## 2018-11-01 ENCOUNTER — Encounter: Payer: Self-pay | Admitting: Pharmacy Technician

## 2018-11-01 NOTE — Progress Notes (Signed)
The patient is approved for drug assistance from [employer] for Neulasta and Xgeva. Enrollment is effective until 10/28/19. There are no current  orders for Neulasta and the first DOS for Xgeva is 11/26/18.

## 2018-11-02 ENCOUNTER — Other Ambulatory Visit: Payer: Self-pay | Admitting: *Deleted

## 2018-11-02 DIAGNOSIS — C787 Secondary malignant neoplasm of liver and intrahepatic bile duct: Secondary | ICD-10-CM

## 2018-11-02 DIAGNOSIS — C801 Malignant (primary) neoplasm, unspecified: Principal | ICD-10-CM

## 2018-11-03 ENCOUNTER — Other Ambulatory Visit: Payer: Self-pay | Admitting: *Deleted

## 2018-11-04 ENCOUNTER — Ambulatory Visit: Payer: Self-pay

## 2018-11-04 ENCOUNTER — Inpatient Hospital Stay: Payer: Medicaid Other

## 2018-11-04 ENCOUNTER — Other Ambulatory Visit: Payer: Self-pay | Admitting: Hematology

## 2018-11-04 ENCOUNTER — Inpatient Hospital Stay: Payer: Medicaid Other | Attending: Hematology

## 2018-11-04 ENCOUNTER — Telehealth: Payer: Self-pay | Admitting: Hematology

## 2018-11-04 VITALS — BP 110/65 | HR 72 | Temp 98.3°F | Resp 16

## 2018-11-04 DIAGNOSIS — C801 Malignant (primary) neoplasm, unspecified: Principal | ICD-10-CM

## 2018-11-04 DIAGNOSIS — E43 Unspecified severe protein-calorie malnutrition: Secondary | ICD-10-CM | POA: Insufficient documentation

## 2018-11-04 DIAGNOSIS — Z7189 Other specified counseling: Secondary | ICD-10-CM

## 2018-11-04 DIAGNOSIS — Z5111 Encounter for antineoplastic chemotherapy: Secondary | ICD-10-CM | POA: Insufficient documentation

## 2018-11-04 DIAGNOSIS — D649 Anemia, unspecified: Secondary | ICD-10-CM | POA: Diagnosis not present

## 2018-11-04 DIAGNOSIS — N2889 Other specified disorders of kidney and ureter: Secondary | ICD-10-CM | POA: Insufficient documentation

## 2018-11-04 DIAGNOSIS — C787 Secondary malignant neoplasm of liver and intrahepatic bile duct: Secondary | ICD-10-CM

## 2018-11-04 DIAGNOSIS — D696 Thrombocytopenia, unspecified: Secondary | ICD-10-CM | POA: Diagnosis not present

## 2018-11-04 DIAGNOSIS — C7A1 Malignant poorly differentiated neuroendocrine tumors: Secondary | ICD-10-CM | POA: Diagnosis present

## 2018-11-04 DIAGNOSIS — C7B02 Secondary carcinoid tumors of liver: Secondary | ICD-10-CM | POA: Diagnosis not present

## 2018-11-04 DIAGNOSIS — R945 Abnormal results of liver function studies: Secondary | ICD-10-CM | POA: Insufficient documentation

## 2018-11-04 DIAGNOSIS — C7951 Secondary malignant neoplasm of bone: Secondary | ICD-10-CM

## 2018-11-04 LAB — CMP (CANCER CENTER ONLY)
ALT: 30 U/L (ref 0–44)
ANION GAP: 10 (ref 5–15)
AST: 41 U/L (ref 15–41)
Albumin: 2.8 g/dL — ABNORMAL LOW (ref 3.5–5.0)
Alkaline Phosphatase: 276 U/L — ABNORMAL HIGH (ref 38–126)
BUN: 14 mg/dL (ref 6–20)
CO2: 23 mmol/L (ref 22–32)
Calcium: 9.5 mg/dL (ref 8.9–10.3)
Chloride: 100 mmol/L (ref 98–111)
Creatinine: 0.96 mg/dL (ref 0.61–1.24)
GFR, Estimated: >60 mL/min (ref >60)
Glucose, Bld: 108 mg/dL — ABNORMAL HIGH (ref 70–99)
POTASSIUM: 4.9 mmol/L (ref 3.5–5.1)
SODIUM: 133 mmol/L — AB (ref 135–145)
Total Bilirubin: 1.6 mg/dL — ABNORMAL HIGH (ref 0.3–1.2)
Total Protein: 7.7 g/dL (ref 6.5–8.1)

## 2018-11-04 LAB — CBC WITH DIFFERENTIAL (CANCER CENTER ONLY)
Abs Immature Granulocytes: 0.03 10*3/uL (ref 0.00–0.07)
BASOS ABS: 0 10*3/uL (ref 0.0–0.1)
Basophils Relative: 1 %
Eosinophils Absolute: 0.2 10*3/uL (ref 0.0–0.5)
Eosinophils Relative: 3 %
HCT: 41.1 % (ref 39.0–52.0)
Hemoglobin: 13.2 g/dL (ref 13.0–17.0)
Immature Granulocytes: 1 %
Lymphocytes Relative: 19 %
Lymphs Abs: 1.2 10*3/uL (ref 0.7–4.0)
MCH: 27.4 pg (ref 26.0–34.0)
MCHC: 32.1 g/dL (ref 30.0–36.0)
MCV: 85.3 fL (ref 80.0–100.0)
Monocytes Absolute: 1 10*3/uL (ref 0.1–1.0)
Monocytes Relative: 15 %
NEUTROS ABS: 4.1 10*3/uL (ref 1.7–7.7)
NEUTROS PCT: 61 %
NRBC: 0 % (ref 0.0–0.2)
Platelet Count: 145 10*3/uL — ABNORMAL LOW (ref 150–400)
RBC: 4.82 MIL/uL (ref 4.22–5.81)
RDW: 13.6 % (ref 11.5–15.5)
WBC Count: 6.5 10*3/uL (ref 4.0–10.5)

## 2018-11-04 MED ORDER — DENOSUMAB 120 MG/1.7ML ~~LOC~~ SOLN
SUBCUTANEOUS | Status: AC
  Filled 2018-11-04: qty 1.7

## 2018-11-04 MED ORDER — SODIUM CHLORIDE 0.9 % IV SOLN
Freq: Once | INTRAVENOUS | Status: AC
  Administered 2018-11-04: 13:00:00 via INTRAVENOUS
  Filled 2018-11-04: qty 250

## 2018-11-04 MED ORDER — DENOSUMAB 120 MG/1.7ML ~~LOC~~ SOLN
120.0000 mg | Freq: Once | SUBCUTANEOUS | Status: AC
  Administered 2018-11-04: 120 mg via SUBCUTANEOUS

## 2018-11-04 MED ORDER — SODIUM CHLORIDE 0.9 % IV SOLN
Freq: Once | INTRAVENOUS | Status: AC
  Administered 2018-11-04: 14:00:00 via INTRAVENOUS
  Filled 2018-11-04: qty 5

## 2018-11-04 MED ORDER — SODIUM CHLORIDE 0.9 % IV SOLN
549.0000 mg | Freq: Once | INTRAVENOUS | Status: AC
  Administered 2018-11-04: 550 mg via INTRAVENOUS
  Filled 2018-11-04: qty 55

## 2018-11-04 MED ORDER — PALONOSETRON HCL INJECTION 0.25 MG/5ML
0.2500 mg | Freq: Once | INTRAVENOUS | Status: AC
  Administered 2018-11-04: 0.25 mg via INTRAVENOUS

## 2018-11-04 MED ORDER — PALONOSETRON HCL INJECTION 0.25 MG/5ML
INTRAVENOUS | Status: AC
  Filled 2018-11-04: qty 5

## 2018-11-04 MED ORDER — HEPARIN SOD (PORK) LOCK FLUSH 100 UNIT/ML IV SOLN
500.0000 [IU] | Freq: Once | INTRAVENOUS | Status: AC | PRN
  Administered 2018-11-04: 500 [IU]
  Filled 2018-11-04: qty 5

## 2018-11-04 MED ORDER — SODIUM CHLORIDE 0.9 % IV SOLN
100.0000 mg/m2 | Freq: Once | INTRAVENOUS | Status: AC
  Administered 2018-11-04: 180 mg via INTRAVENOUS
  Filled 2018-11-04: qty 9

## 2018-11-04 MED ORDER — SODIUM CHLORIDE 0.9% FLUSH
10.0000 mL | INTRAVENOUS | Status: DC | PRN
  Administered 2018-11-04: 10 mL
  Filled 2018-11-04: qty 10

## 2018-11-04 NOTE — Progress Notes (Signed)
Per Dr. Irene Limbo - Ok to treat today with T. Bili 1.6

## 2018-11-04 NOTE — Telephone Encounter (Signed)
Left message for patient re 1/6 injection appointment. Other appointments remain the same.

## 2018-11-04 NOTE — Progress Notes (Signed)
Per Dr. Irene Limbo, keep Etoposide dose at 100 mg/m2 despite Tbili = 1.6 today. Kennith Center, Pharm.D., CPP 11/04/2018@1 :58 PM

## 2018-11-04 NOTE — Patient Instructions (Addendum)
[facility] Discharge Instructions for Patients Receiving Chemotherapy  Today you received the following chemotherapy agents :  Carboplatin, Etoposide,  Denosumab.  To help prevent nausea and vomiting after your treatment, we encourage you to take your nausea medication as prescribed.  TAKE  ZOFRAN 8 MG TWICE DAILY FOR NAUSEA  FOR 2 DAYS AFTER CHEMO - START ON THIRD DAY AFTER CHEMO.  TAKE COMPAZINE 10 MG EVERY 6 HOURS AS NEEDED FOR NAUSEA.  DO DRINK LOTS OF FLUIDS AS TOLERATED.  DO NOT EAT GREASY NOR SPICY FOODS.   TAKE CALCIUM + VIT D  1000 MG TO 1200 MG DAILY OR AS INSTRUCTED BY DR. Irene Limbo.   If you develop nausea and vomiting that is not controlled by your nausea medication, call the clinic.   BELOW ARE SYMPTOMS THAT SHOULD BE REPORTED IMMEDIATELY:  *FEVER GREATER THAN 100.5 F  *CHILLS WITH OR WITHOUT FEVER  NAUSEA AND VOMITING THAT IS NOT CONTROLLED WITH YOUR NAUSEA MEDICATION  *UNUSUAL SHORTNESS OF BREATH  *UNUSUAL BRUISING OR BLEEDING  TENDERNESS IN MOUTH AND THROAT WITH OR WITHOUT PRESENCE OF ULCERS  *URINARY PROBLEMS  *BOWEL PROBLEMS  UNUSUAL RASH Items with * indicate a potential emergency and should be followed up as soon as possible.  Feel free to call the clinic should you have any questions or concerns. The clinic phone number is [phone].  Please show the [place] at check-in to the Emergency Department and triage nurse.   Carboplatin injection What is this medicine? CARBOPLATIN (KAR boe pla tin) is a chemotherapy drug. It targets fast dividing cells, like cancer cells, and causes these cells to die. This medicine is used to treat ovarian cancer and many other cancers. This medicine may be used for other purposes; ask your health care provider or pharmacist if you have questions. COMMON BRAND NAME(S): Paraplatin What should I tell my health care provider before I take this medicine? They need to know if you have any of these  conditions: -blood disorders -hearing problems -kidney disease -recent or ongoing radiation therapy -an unusual or allergic reaction to carboplatin, cisplatin, other chemotherapy, other medicines, foods, dyes, or preservatives -pregnant or trying to get pregnant -breast-feeding How should I use this medicine? This drug is usually given as an infusion into a vein. It is administered in a hospital or clinic by a specially trained health care professional. Talk to your pediatrician regarding the use of this medicine in children. Special care may be needed. Overdosage: If you think you have taken too much of this medicine contact a poison control center or emergency room at once. NOTE: This medicine is only for you. Do not share this medicine with others. What if I miss a dose? It is important not to miss a dose. Call your doctor or health care professional if you are unable to keep an appointment. What may interact with this medicine? -medicines for seizures -medicines to increase blood counts like filgrastim, pegfilgrastim, sargramostim -some antibiotics like amikacin, gentamicin, neomycin, streptomycin, tobramycin -vaccines Talk to your doctor or health care professional before taking any of these medicines: -acetaminophen -aspirin -ibuprofen -ketoprofen -naproxen This list may not describe all possible interactions. Give your health care provider a list of all the medicines, herbs, non-prescription drugs, or dietary supplements you use. Also tell them if you smoke, drink alcohol, or use illegal drugs. Some items may interact with your medicine. What should I watch for while using this medicine? Your condition will be monitored carefully while you  are receiving this medicine. You will need important blood work done while you are taking this medicine. This drug may make you feel generally unwell. This is not uncommon, as chemotherapy can affect healthy cells as well as cancer cells. Report  any side effects. Continue your course of treatment even though you feel ill unless your doctor tells you to stop. In some cases, you may be given additional medicines to help with side effects. Follow all directions for their use. Call your doctor or health care professional for advice if you get a fever, chills or sore throat, or other symptoms of a cold or flu. Do not treat yourself. This drug decreases your body's ability to fight infections. Try to avoid being around people who are sick. This medicine may increase your risk to bruise or bleed. Call your doctor or health care professional if you notice any unusual bleeding. Be careful brushing and flossing your teeth or using a toothpick because you may get an infection or bleed more easily. If you have any dental work done, tell your dentist you are receiving this medicine. Avoid taking products that contain aspirin, acetaminophen, ibuprofen, naproxen, or ketoprofen unless instructed by your doctor. These medicines may hide a fever. Do not become pregnant while taking this medicine. Women should inform their doctor if they wish to become pregnant or think they might be pregnant. There is a potential for serious side effects to an unborn child. Talk to your health care professional or pharmacist for more information. Do not breast-feed an infant while taking this medicine. What side effects may I notice from receiving this medicine? Side effects that you should report to your doctor or health care professional as soon as possible: -allergic reactions like skin rash, itching or hives, swelling of the face, lips, or tongue -signs of infection - fever or chills, cough, sore throat, pain or difficulty passing urine -signs of decreased platelets or bleeding - bruising, pinpoint red spots on the skin, black, tarry stools, nosebleeds -signs of decreased red blood cells - unusually weak or tired, fainting spells, lightheadedness -breathing  problems -changes in hearing -changes in vision -chest pain -high blood pressure -low blood counts - This drug may decrease the number of white blood cells, red blood cells and platelets. You may be at increased risk for infections and bleeding. -nausea and vomiting -pain, swelling, redness or irritation at the injection site -pain, tingling, numbness in the hands or feet -problems with balance, talking, walking -trouble passing urine or change in the amount of urine Side effects that usually do not require medical attention (report to your doctor or health care professional if they continue or are bothersome): -hair loss -loss of appetite -metallic taste in the mouth or changes in taste This list may not describe all possible side effects. Call your doctor for medical advice about side effects. You may report side effects to FDA at 1-800-FDA-1088. Where should I keep my medicine? This drug is given in a hospital or clinic and will not be stored at home. NOTE: This sheet is a summary. It may not cover all possible information. If you have questions about this medicine, talk to your doctor, pharmacist, or health care provider.  2019 Elsevier/Gold Standard (2008-01-25 14:38:05)   Etoposide, VP-16 injection What is this medicine? ETOPOSIDE, VP-16 (e toe POE side) is a chemotherapy drug. It is used to treat testicular cancer, lung cancer, and other cancers. This medicine may be used for other purposes; ask your health care provider  or pharmacist if you have questions. COMMON BRAND NAME(S): Etopophos, Toposar, VePesid What should I tell my health care provider before I take this medicine? They need to know if you have any of these conditions: -infection -kidney disease -liver disease -low blood counts, like low white cell, platelet, or red cell counts -an unusual or allergic reaction to etoposide, other medicines, foods, dyes, or preservatives -pregnant or trying to get  pregnant -breast-feeding How should I use this medicine? This medicine is for infusion into a vein. It is administered in a hospital or clinic by a specially trained health care professional. Talk to your pediatrician regarding the use of this medicine in children. Special care may be needed. Overdosage: If you think you have taken too much of this medicine contact a poison control center or emergency room at once. NOTE: This medicine is only for you. Do not share this medicine with others. What if I miss a dose? It is important not to miss your dose. Call your doctor or health care professional if you are unable to keep an appointment. What may interact with this medicine? -aspirin -certain medications for seizures like carbamazepine, phenobarbital, phenytoin, valproic acid -cyclosporine -levamisole -warfarin This list may not describe all possible interactions. Give your health care provider a list of all the medicines, herbs, non-prescription drugs, or dietary supplements you use. Also tell them if you smoke, drink alcohol, or use illegal drugs. Some items may interact with your medicine. What should I watch for while using this medicine? Visit your doctor for checks on your progress. This drug may make you feel generally unwell. This is not uncommon, as chemotherapy can affect healthy cells as well as cancer cells. Report any side effects. Continue your course of treatment even though you feel ill unless your doctor tells you to stop. In some cases, you may be given additional medicines to help with side effects. Follow all directions for their use. Call your doctor or health care professional for advice if you get a fever, chills or sore throat, or other symptoms of a cold or flu. Do not treat yourself. This drug decreases your body's ability to fight infections. Try to avoid being around people who are sick. This medicine may increase your risk to bruise or bleed. Call your doctor or health  care professional if you notice any unusual bleeding. Talk to your doctor about your risk of cancer. You may be more at risk for certain types of cancers if you take this medicine. Do not become pregnant while taking this medicine or for at least 6 months after stopping it. Women should inform their doctor if they wish to become pregnant or think they might be pregnant. Women of child-bearing potential will need to have a negative pregnancy test before starting this medicine. There is a potential for serious side effects to an unborn child. Talk to your health care professional or pharmacist for more information. Do not breast-feed an infant while taking this medicine. Men must use a latex condom during sexual contact with a woman while taking this medicine and for at least 4 months after stopping it. A latex condom is needed even if you have had a vasectomy. Contact your doctor right away if your partner becomes pregnant. Do not donate sperm while taking this medicine and for at least 4 months after you stop taking this medicine. Men should inform their doctors if they wish to father a child. This medicine may lower sperm counts. What side effects may  I notice from receiving this medicine? Side effects that you should report to your doctor or health care professional as soon as possible: -allergic reactions like skin rash, itching or hives, swelling of the face, lips, or tongue -low blood counts - this medicine may decrease the number of white blood cells, red blood cells and platelets. You may be at increased risk for infections and bleeding. -signs of infection - fever or chills, cough, sore throat, pain or difficulty passing urine -signs of decreased platelets or bleeding - bruising, pinpoint red spots on the skin, black, tarry stools, blood in the urine -signs of decreased red blood cells - unusually weak or tired, fainting spells, lightheadedness -breathing problems -changes in vision -mouth or  throat sores or ulcers -pain, redness, swelling or irritation at the injection site -pain, tingling, numbness in the hands or feet -redness, blistering, peeling or loosening of the skin, including inside the mouth -seizures -vomiting Side effects that usually do not require medical attention (report to your doctor or health care professional if they continue or are bothersome): -diarrhea -hair loss -loss of appetite -nausea -stomach pain This list may not describe all possible side effects. Call your doctor for medical advice about side effects. You may report side effects to FDA at 1-800-FDA-1088. Where should I keep my medicine? This drug is given in a hospital or clinic and will not be stored at home. NOTE: This sheet is a summary. It may not cover all possible information. If you have questions about this medicine, talk to your doctor, pharmacist, or health care provider.  2019 Elsevier/Gold Standard (2015-10-12 11:53:23)   Denosumab injection What is this medicine? DENOSUMAB (den oh sue mab) slows bone breakdown. Prolia is used to treat osteoporosis in women after menopause and in men, and in people who are taking corticosteroids for 6 months or more. Delton See is used to treat a high calcium level due to cancer and to prevent bone fractures and other bone problems caused by multiple myeloma or cancer bone metastases. Delton See is also used to treat giant cell tumor of the bone. This medicine may be used for other purposes; ask your health care provider or pharmacist if you have questions. COMMON BRAND NAME(S): Prolia, XGEVA What should I tell my health care provider before I take this medicine? They need to know if you have any of these conditions: -dental disease -having surgery or tooth extraction -infection -kidney disease -low levels of calcium or Vitamin D in the blood -malnutrition -on hemodialysis -skin conditions or sensitivity -thyroid or parathyroid disease -an unusual  reaction to denosumab, other medicines, foods, dyes, or preservatives -pregnant or trying to get pregnant -breast-feeding How should I use this medicine? This medicine is for injection under the skin. It is given by a health care professional in a hospital or clinic setting. A special MedGuide will be given to you before each treatment. Be sure to read this information carefully each time. For Prolia, talk to your pediatrician regarding the use of this medicine in children. Special care may be needed. For Delton See, talk to your pediatrician regarding the use of this medicine in children. While this drug may be prescribed for children as young as 13 years for selected conditions, precautions do apply. Overdosage: If you think you have taken too much of this medicine contact a poison control center or emergency room at once. NOTE: This medicine is only for you. Do not share this medicine with others. What if I miss a dose? It is  important not to miss your dose. Call your doctor or health care professional if you are unable to keep an appointment. What may interact with this medicine? Do not take this medicine with any of the following medications: -other medicines containing denosumab This medicine may also interact with the following medications: -medicines that lower your chance of fighting infection -steroid medicines like prednisone or cortisone This list may not describe all possible interactions. Give your health care provider a list of all the medicines, herbs, non-prescription drugs, or dietary supplements you use. Also tell them if you smoke, drink alcohol, or use illegal drugs. Some items may interact with your medicine. What should I watch for while using this medicine? Visit your doctor or health care professional for regular checks on your progress. Your doctor or health care professional may order blood tests and other tests to see how you are doing. Call your doctor or health care  professional for advice if you get a fever, chills or sore throat, or other symptoms of a cold or flu. Do not treat yourself. This drug may decrease your body's ability to fight infection. Try to avoid being around people who are sick. You should make sure you get enough calcium and vitamin D while you are taking this medicine, unless your doctor tells you not to. Discuss the foods you eat and the vitamins you take with your health care professional. See your dentist regularly. Brush and floss your teeth as directed. Before you have any dental work done, tell your dentist you are receiving this medicine. Do not become pregnant while taking this medicine or for 5 months after stopping it. Talk with your doctor or health care professional about your birth control options while taking this medicine. Women should inform their doctor if they wish to become pregnant or think they might be pregnant. There is a potential for serious side effects to an unborn child. Talk to your health care professional or pharmacist for more information. What side effects may I notice from receiving this medicine? Side effects that you should report to your doctor or health care professional as soon as possible: -allergic reactions like skin rash, itching or hives, swelling of the face, lips, or tongue -bone pain -breathing problems -dizziness -jaw pain, especially after dental work -redness, blistering, peeling of the skin -signs and symptoms of infection like fever or chills; cough; sore throat; pain or trouble passing urine -signs of low calcium like fast heartbeat, muscle cramps or muscle pain; pain, tingling, numbness in the hands or feet; seizures -unusual bleeding or bruising -unusually weak or tired Side effects that usually do not require medical attention (report to your doctor or health care professional if they continue or are bothersome): -constipation -diarrhea -headache -joint pain -loss of  appetite -muscle pain -runny nose -tiredness -upset stomach This list may not describe all possible side effects. Call your doctor for medical advice about side effects. You may report side effects to FDA at 1-800-FDA-1088. Where should I keep my medicine? This medicine is only given in a clinic, doctor's office, or other health care setting and will not be stored at home. NOTE: This sheet is a summary. It may not cover all possible information. If you have questions about this medicine, talk to your doctor, pharmacist, or health care provider.  2019 Elsevier/Gold Standard (2018-02-26 16:10:44)

## 2018-11-05 ENCOUNTER — Inpatient Hospital Stay: Payer: Medicaid Other

## 2018-11-05 VITALS — BP 121/59 | HR 69 | Temp 97.7°F | Resp 16

## 2018-11-05 DIAGNOSIS — C787 Secondary malignant neoplasm of liver and intrahepatic bile duct: Secondary | ICD-10-CM

## 2018-11-05 DIAGNOSIS — Z7189 Other specified counseling: Secondary | ICD-10-CM

## 2018-11-05 DIAGNOSIS — Z5111 Encounter for antineoplastic chemotherapy: Secondary | ICD-10-CM | POA: Diagnosis not present

## 2018-11-05 DIAGNOSIS — C801 Malignant (primary) neoplasm, unspecified: Principal | ICD-10-CM

## 2018-11-05 MED ORDER — SODIUM CHLORIDE 0.9% FLUSH
10.0000 mL | INTRAVENOUS | Status: DC | PRN
  Administered 2018-11-05: 10 mL
  Filled 2018-11-05: qty 10

## 2018-11-05 MED ORDER — DEXAMETHASONE SODIUM PHOSPHATE 10 MG/ML IJ SOLN
INTRAMUSCULAR | Status: AC
  Filled 2018-11-05: qty 1

## 2018-11-05 MED ORDER — SODIUM CHLORIDE 0.9 % IV SOLN
Freq: Once | INTRAVENOUS | Status: AC
  Administered 2018-11-05: 14:00:00 via INTRAVENOUS
  Filled 2018-11-05: qty 250

## 2018-11-05 MED ORDER — HEPARIN SOD (PORK) LOCK FLUSH 100 UNIT/ML IV SOLN
500.0000 [IU] | Freq: Once | INTRAVENOUS | Status: AC | PRN
  Administered 2018-11-05: 500 [IU]
  Filled 2018-11-05: qty 5

## 2018-11-05 MED ORDER — SODIUM CHLORIDE 0.9 % IV SOLN
100.0000 mg/m2 | Freq: Once | INTRAVENOUS | Status: AC
  Administered 2018-11-05: 180 mg via INTRAVENOUS
  Filled 2018-11-05: qty 9

## 2018-11-05 MED ORDER — DEXAMETHASONE SODIUM PHOSPHATE 10 MG/ML IJ SOLN
10.0000 mg | Freq: Once | INTRAMUSCULAR | Status: AC
  Administered 2018-11-05: 10 mg via INTRAVENOUS

## 2018-11-05 NOTE — Patient Instructions (Signed)
[facility] Discharge Instructions for Patients Receiving Chemotherapy  Today you received the following chemotherapy agents :  Carboplatin  To help prevent nausea and vomiting after your treatment, we encourage you to take your nausea medication as prescribed.  TAKE  ZOFRAN 8 MG TWICE DAILY FOR NAUSEA  FOR 2 DAYS AFTER CHEMO - START ON THIRD DAY AFTER CHEMO.  TAKE COMPAZINE 10 MG EVERY 6 HOURS AS NEEDED FOR NAUSEA.  DO DRINK LOTS OF FLUIDS AS TOLERATED.  DO NOT EAT GREASY NOR SPICY FOODS.   TAKE CALCIUM + VIT D  1000 MG TO 1200 MG DAILY OR AS INSTRUCTED BY DR. Irene Limbo.   If you develop nausea and vomiting that is not controlled by your nausea medication, call the clinic.   BELOW ARE SYMPTOMS THAT SHOULD BE REPORTED IMMEDIATELY:  *FEVER GREATER THAN 100.5 F  *CHILLS WITH OR WITHOUT FEVER  NAUSEA AND VOMITING THAT IS NOT CONTROLLED WITH YOUR NAUSEA MEDICATION  *UNUSUAL SHORTNESS OF BREATH  *UNUSUAL BRUISING OR BLEEDING  TENDERNESS IN MOUTH AND THROAT WITH OR WITHOUT PRESENCE OF ULCERS  *URINARY PROBLEMS  *BOWEL PROBLEMS  UNUSUAL RASH Items with * indicate a potential emergency and should be followed up as soon as possible.  Feel free to call the clinic should you have any questions or concerns. The clinic phone number is [phone].  Please show the [facility] at check-in to the Emergency Department and triage nurse.

## 2018-11-06 ENCOUNTER — Inpatient Hospital Stay: Payer: Medicaid Other

## 2018-11-06 VITALS — BP 115/56 | HR 71 | Temp 97.7°F | Resp 18

## 2018-11-06 DIAGNOSIS — C801 Malignant (primary) neoplasm, unspecified: Principal | ICD-10-CM

## 2018-11-06 DIAGNOSIS — Z7189 Other specified counseling: Secondary | ICD-10-CM

## 2018-11-06 DIAGNOSIS — C787 Secondary malignant neoplasm of liver and intrahepatic bile duct: Secondary | ICD-10-CM

## 2018-11-06 DIAGNOSIS — Z5111 Encounter for antineoplastic chemotherapy: Secondary | ICD-10-CM | POA: Diagnosis not present

## 2018-11-06 MED ORDER — DEXAMETHASONE SODIUM PHOSPHATE 10 MG/ML IJ SOLN
10.0000 mg | Freq: Once | INTRAMUSCULAR | Status: AC
  Administered 2018-11-06: 10 mg via INTRAVENOUS

## 2018-11-06 MED ORDER — SODIUM CHLORIDE 0.9% FLUSH
10.0000 mL | INTRAVENOUS | Status: DC | PRN
  Administered 2018-11-06: 10 mL
  Filled 2018-11-06: qty 10

## 2018-11-06 MED ORDER — SODIUM CHLORIDE 0.9 % IV SOLN
Freq: Once | INTRAVENOUS | Status: AC
  Administered 2018-11-06: 11:00:00 via INTRAVENOUS
  Filled 2018-11-06: qty 250

## 2018-11-06 MED ORDER — SODIUM CHLORIDE 0.9 % IV SOLN
100.0000 mg/m2 | Freq: Once | INTRAVENOUS | Status: AC
  Administered 2018-11-06: 180 mg via INTRAVENOUS
  Filled 2018-11-06: qty 9

## 2018-11-06 MED ORDER — HEPARIN SOD (PORK) LOCK FLUSH 100 UNIT/ML IV SOLN
500.0000 [IU] | Freq: Once | INTRAVENOUS | Status: AC | PRN
  Administered 2018-11-06: 500 [IU]
  Filled 2018-11-06: qty 5

## 2018-11-06 MED ORDER — DEXAMETHASONE SODIUM PHOSPHATE 10 MG/ML IJ SOLN
INTRAMUSCULAR | Status: AC
  Filled 2018-11-06: qty 1

## 2018-11-06 NOTE — Progress Notes (Signed)
Patient c/o stomach cramping during infusion.  VS stable.  Patient stated he was having hard time having BM's lately.  Small BM this AM. Cramping comes and goes per patient.  Pain present in all four quadrants.  Nurse encouraged fluids, and educated use of stool softeners and Miralax. Patient voiced understanding and agreement.

## 2018-11-06 NOTE — Patient Instructions (Signed)
[facility] Discharge Instructions for Patients Receiving Chemotherapy  Today you received the following chemotherapy agents: Etoposide.     To help prevent nausea and vomiting after your treatment, we encourage you to take your nausea medication as directed.  If you develop nausea and vomiting that is not controlled by your nausea medication, call the clinic.   BELOW ARE SYMPTOMS THAT SHOULD BE REPORTED IMMEDIATELY:  *FEVER GREATER THAN 100.5 F  *CHILLS WITH OR WITHOUT FEVER  NAUSEA AND VOMITING THAT IS NOT CONTROLLED WITH YOUR NAUSEA MEDICATION  *UNUSUAL SHORTNESS OF BREATH  *UNUSUAL BRUISING OR BLEEDING  TENDERNESS IN MOUTH AND THROAT WITH OR WITHOUT PRESENCE OF ULCERS  *URINARY PROBLEMS  *BOWEL PROBLEMS  UNUSUAL RASH Items with * indicate a potential emergency and should be followed up as soon as possible.  Feel free to call the clinic should you have any questions or concerns. The clinic phone number is [phone].  Please show the [facility] at check-in to the Emergency Department and triage nurse.    Constipation, Adult Constipation is when a person:  Poops (has a bowel movement) fewer times in a week than normal.  Has a hard time pooping.  Has poop that is dry, hard, or bigger than normal. Follow these instructions at home: Eating and drinking   Eat foods that have a lot of fiber, such as: ? Fresh fruits and vegetables. ? Whole grains. ? Beans.  Eat less of foods that are high in fat, low in fiber, or overly processed, such as: ? [place] fries. ? Hamburgers. ? Cookies. ? Candy. ? Soda.  Drink enough fluid to keep your pee (urine) clear or pale yellow. General instructions  Exercise regularly or as told by your doctor.  Go to the restroom when you feel like you need to poop. Do not hold it in.  Take over-the-counter and prescription medicines only as told by your doctor. These include any fiber supplements.  Do pelvic  floor retraining exercises, such as: ? Doing deep breathing while relaxing your lower belly (abdomen). ? Relaxing your pelvic floor while pooping.  Watch your condition for any changes.  Keep all follow-up visits as told by your doctor. This is important. Contact a doctor if:  You have pain that gets worse.  You have a fever.  You have not pooped for 4 days.  You throw up (vomit).  You are not hungry.  You lose weight.  You are bleeding from the anus.  You have thin, pencil-like poop (stool). Get help right away if:  You have a fever, and your symptoms suddenly get worse.  You leak poop or have blood in your poop.  Your belly feels hard or bigger than normal (is bloated).  You have very bad belly pain.  You feel dizzy or you faint. This information is not intended to replace advice given to you by your health care provider. Make sure you discuss any questions you have with your health care provider. Document Released: 04/07/2008 Document Revised: 05/09/2016 Document Reviewed: 04/09/2016 Elsevier Interactive Patient Education  2019 [employer].

## 2018-11-08 ENCOUNTER — Inpatient Hospital Stay: Payer: Medicaid Other

## 2018-11-08 VITALS — BP 117/62 | HR 66 | Temp 98.3°F | Resp 16

## 2018-11-08 DIAGNOSIS — Z5111 Encounter for antineoplastic chemotherapy: Secondary | ICD-10-CM | POA: Diagnosis not present

## 2018-11-08 DIAGNOSIS — C787 Secondary malignant neoplasm of liver and intrahepatic bile duct: Secondary | ICD-10-CM

## 2018-11-08 DIAGNOSIS — C801 Malignant (primary) neoplasm, unspecified: Principal | ICD-10-CM

## 2018-11-08 DIAGNOSIS — Z7189 Other specified counseling: Secondary | ICD-10-CM

## 2018-11-08 MED ORDER — PEGFILGRASTIM INJECTION 6 MG/0.6ML ~~LOC~~
PREFILLED_SYRINGE | SUBCUTANEOUS | Status: AC
  Filled 2018-11-08: qty 0.6

## 2018-11-08 MED ORDER — PEGFILGRASTIM INJECTION 6 MG/0.6ML ~~LOC~~
6.0000 mg | PREFILLED_SYRINGE | Freq: Once | SUBCUTANEOUS | Status: AC
  Administered 2018-11-08: 6 mg via SUBCUTANEOUS

## 2018-11-08 NOTE — Patient Instructions (Signed)
Pegfilgrastim injection  What is this medicine?  PEGFILGRASTIM (PEG fil gra stim) is a long-acting granulocyte colony-stimulating factor that stimulates the growth of neutrophils, a type of white blood cell important in the body's fight against infection. It is used to reduce the incidence of fever and infection in patients with certain types of cancer who are receiving chemotherapy that affects the bone marrow, and to increase survival after being exposed to high doses of radiation.  This medicine may be used for other purposes; ask your health care provider or pharmacist if you have questions.  COMMON BRAND NAME(S): Fulphila, Neulasta, UDENYCA  What should I tell my health care provider before I take this medicine?  They need to know if you have any of these conditions:  -kidney disease  -latex allergy  -ongoing radiation therapy  -sickle cell disease  -skin reactions to acrylic adhesives (On-Body Injector only)  -an unusual or allergic reaction to pegfilgrastim, filgrastim, other medicines, foods, dyes, or preservatives  -pregnant or trying to get pregnant  -breast-feeding  How should I use this medicine?  This medicine is for injection under the skin. If you get this medicine at home, you will be taught how to prepare and give the pre-filled syringe or how to use the On-body Injector. Refer to the patient Instructions for Use for detailed instructions. Use exactly as directed. Tell your healthcare provider immediately if you suspect that the On-body Injector may not have performed as intended or if you suspect the use of the On-body Injector resulted in a missed or partial dose.  It is important that you put your used needles and syringes in a special sharps container. Do not put them in a trash can. If you do not have a sharps container, call your pharmacist or healthcare provider to get one.  Talk to your pediatrician regarding the use of this medicine in children. While this drug may be prescribed for  selected conditions, precautions do apply.  Overdosage: If you think you have taken too much of this medicine contact a poison control center or emergency room at once.  NOTE: This medicine is only for you. Do not share this medicine with others.  What if I miss a dose?  It is important not to miss your dose. Call your doctor or health care professional if you miss your dose. If you miss a dose due to an On-body Injector failure or leakage, a new dose should be administered as soon as possible using a single prefilled syringe for manual use.  What may interact with this medicine?  Interactions have not been studied.  Give your health care provider a list of all the medicines, herbs, non-prescription drugs, or dietary supplements you use. Also tell them if you smoke, drink alcohol, or use illegal drugs. Some items may interact with your medicine.  This list may not describe all possible interactions. Give your health care provider a list of all the medicines, herbs, non-prescription drugs, or dietary supplements you use. Also tell them if you smoke, drink alcohol, or use illegal drugs. Some items may interact with your medicine.  What should I watch for while using this medicine?  You may need blood work done while you are taking this medicine.  If you are going to need a MRI, CT scan, or other procedure, tell your doctor that you are using this medicine (On-Body Injector only).  What side effects may I notice from receiving this medicine?  Side effects that you should report to   your doctor or health care professional as soon as possible:  -allergic reactions like skin rash, itching or hives, swelling of the face, lips, or tongue  -back pain  -dizziness  -fever  -pain, redness, or irritation at site where injected  -pinpoint red spots on the skin  -red or dark-brown urine  -shortness of breath or breathing problems  -stomach or side pain, or pain at the shoulder  -swelling  -tiredness  -trouble passing urine or  change in the amount of urine  Side effects that usually do not require medical attention (report to your doctor or health care professional if they continue or are bothersome):  -bone pain  -muscle pain  This list may not describe all possible side effects. Call your doctor for medical advice about side effects. You may report side effects to FDA at 1-800-FDA-1088.  Where should I keep my medicine?  Keep out of the reach of children.  If you are using this medicine at home, you will be instructed on how to store it. Throw away any unused medicine after the expiration date on the label.  NOTE: This sheet is a summary. It may not cover all possible information. If you have questions about this medicine, talk to your doctor, pharmacist, or health care provider.   2019 [employer] (2018-01-25 16:57:08)

## 2018-11-09 ENCOUNTER — Encounter: Payer: Self-pay | Admitting: Gastroenterology

## 2018-11-09 ENCOUNTER — Other Ambulatory Visit: Payer: Self-pay | Admitting: *Deleted

## 2018-11-09 DIAGNOSIS — C801 Malignant (primary) neoplasm, unspecified: Principal | ICD-10-CM

## 2018-11-09 DIAGNOSIS — C787 Secondary malignant neoplasm of liver and intrahepatic bile duct: Secondary | ICD-10-CM

## 2018-11-10 ENCOUNTER — Inpatient Hospital Stay: Payer: Medicaid Other

## 2018-11-10 ENCOUNTER — Inpatient Hospital Stay (HOSPITAL_BASED_OUTPATIENT_CLINIC_OR_DEPARTMENT_OTHER): Payer: Medicaid Other | Admitting: Hematology

## 2018-11-10 VITALS — BP 114/66 | HR 59 | Temp 98.1°F | Resp 18 | Ht 67.0 in | Wt 148.7 lb

## 2018-11-10 DIAGNOSIS — Z5111 Encounter for antineoplastic chemotherapy: Secondary | ICD-10-CM | POA: Diagnosis not present

## 2018-11-10 DIAGNOSIS — D649 Anemia, unspecified: Secondary | ICD-10-CM

## 2018-11-10 DIAGNOSIS — C787 Secondary malignant neoplasm of liver and intrahepatic bile duct: Secondary | ICD-10-CM

## 2018-11-10 DIAGNOSIS — C7951 Secondary malignant neoplasm of bone: Secondary | ICD-10-CM

## 2018-11-10 DIAGNOSIS — C801 Malignant (primary) neoplasm, unspecified: Principal | ICD-10-CM

## 2018-11-10 DIAGNOSIS — E43 Unspecified severe protein-calorie malnutrition: Secondary | ICD-10-CM

## 2018-11-10 DIAGNOSIS — E46 Unspecified protein-calorie malnutrition: Secondary | ICD-10-CM

## 2018-11-10 DIAGNOSIS — C7A1 Malignant poorly differentiated neuroendocrine tumors: Secondary | ICD-10-CM

## 2018-11-10 DIAGNOSIS — C7B8 Other secondary neuroendocrine tumors: Secondary | ICD-10-CM

## 2018-11-10 DIAGNOSIS — R630 Anorexia: Secondary | ICD-10-CM

## 2018-11-10 DIAGNOSIS — D696 Thrombocytopenia, unspecified: Secondary | ICD-10-CM

## 2018-11-10 LAB — CMP (CANCER CENTER ONLY)
ALBUMIN: 2.6 g/dL — AB (ref 3.5–5.0)
ALT: 19 U/L (ref 0–44)
AST: 27 U/L (ref 15–41)
Alkaline Phosphatase: 203 U/L — ABNORMAL HIGH (ref 38–126)
Anion gap: 6 (ref 5–15)
BUN: 11 mg/dL (ref 6–20)
CALCIUM: 7.7 mg/dL — AB (ref 8.9–10.3)
CO2: 23 mmol/L (ref 22–32)
Chloride: 105 mmol/L (ref 98–111)
Creatinine: 0.63 mg/dL (ref 0.61–1.24)
GFR, Est AFR Am: >60 mL/min (ref >60)
GFR, Estimated: >60 mL/min (ref >60)
Glucose, Bld: 97 mg/dL (ref 70–99)
Potassium: 4.6 mmol/L (ref 3.5–5.1)
Sodium: 134 mmol/L — ABNORMAL LOW (ref 135–145)
Total Bilirubin: 1.1 mg/dL (ref 0.3–1.2)
Total Protein: 6.4 g/dL — ABNORMAL LOW (ref 6.5–8.1)

## 2018-11-10 LAB — CBC WITH DIFFERENTIAL (CANCER CENTER ONLY)
Abs Immature Granulocytes: 0.3 10*3/uL — ABNORMAL HIGH (ref 0.00–0.07)
Band Neutrophils: 9 %
Basophils Absolute: 0 10*3/uL (ref 0.0–0.1)
Basophils Relative: 0 %
EOS PCT: 0 %
Eosinophils Absolute: 0 10*3/uL (ref 0.0–0.5)
HCT: 35.7 % — ABNORMAL LOW (ref 39.0–52.0)
Hemoglobin: 11.4 g/dL — ABNORMAL LOW (ref 13.0–17.0)
LYMPHS PCT: 7 %
Lymphs Abs: 0.8 10*3/uL (ref 0.7–4.0)
MCH: 27.3 pg (ref 26.0–34.0)
MCHC: 31.9 g/dL (ref 30.0–36.0)
MCV: 85.4 fL (ref 80.0–100.0)
Metamyelocytes Relative: 3 %
Monocytes Absolute: 0.3 10*3/uL (ref 0.1–1.0)
Monocytes Relative: 3 %
Neutro Abs: 9.8 10*3/uL (ref 1.7–17.7)
Neutrophils Relative %: 78 %
Platelet Count: 57 10*3/uL — ABNORMAL LOW (ref 150–400)
RBC: 4.18 MIL/uL — ABNORMAL LOW (ref 4.22–5.81)
RDW: 13.2 % (ref 11.5–15.5)
WBC Count: 11.3 10*3/uL — ABNORMAL HIGH (ref 4.0–10.5)
nRBC: 0 % (ref 0.0–0.2)

## 2018-11-10 LAB — LACTATE DEHYDROGENASE: LDH: 228 U/L — ABNORMAL HIGH (ref 98–192)

## 2018-11-10 MED ORDER — DRONABINOL 5 MG PO CAPS: 5.0000 mg | ORAL_CAPSULE | Freq: Two times a day (BID) | ORAL | 0 refills | Status: DC

## 2018-11-10 NOTE — Progress Notes (Signed)
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  HISTORY OF PRESENTING ILLNESS:   [name] is a wonderful 58 y.o. male who has been referred to [place] by Dr. Francine Graven for evaluation and management of Liver masses. He is accompanied today by his wife and aunt. The pt reports that he is doing well overall.   The pt presented to the ED on 10/01/18 regarding persistent RUQ abdominal pain which began a month prior, and noted associations with nausea and 40 pound weight loss. He was evaluated with a CT A/P, as noted below, which revealed liver masses.   The pt reports that he first began feeling RUQ abdominal pain about 3-4 weeks ago, but began losing weight about 2 months ago. He notes that he began feeling more fatigue about 2 months ago as well.  The pt notes that the color of his bowels became dark black and denies taking Iron pills or bismuth medications. He notes that he began feeling constipated as well, feeling backed up. He notes that his stools began looking thinner in caliber. The pt notes that his appetite began decreasing over the last 3-4 weeks ago, and some of this he attributes to intermittent nausea. He denies problems swallowing food. He endorses RUQ pain, and denies vomiting and new cough, new bone pains, testicular pain or swelling, problems passing urine, blood in the urine. The pt has never had a colonoscopy and does not have a PCP, and cites his lack of health insurance as the reason.  The pt was released from the ED with Oxycodone and has been using this to treat his RUQ pain successfully.   The pt notes gout and a heart murmur as previous medical history. He notes that he hasn't consumed alcohol in the past 2 years whatsoever, but prior to this alcohol was  a problem for him.   Of note prior to the patient's visit today, pt has had a CT A/P completed on 10/01/18 with results revealing Interval development of numerous ill-defined hypodense masses throughout the entire liver compatible with metastatic disease. There is a 2.5 cm enhancing mass over the upper pole cortex of the right kidney suspicious for renal cell carcinoma. 2.  No acute findings in the abdomen/pelvis. 3. 1 cm hypodensity over the pancreatic tail not well seen on previous noncontrast exams. MR may be helpful for further evaluation on an elective basis.   Most recent lab results (10/06/18) of CBC w/diff and CMP is as follows: all values are WNL except for Monocytes abs at 1.2k, Sodium at 134, Albumin at 3.0, AST at 77, ALT at 63, Alk Phos at 392, Total Bilirubin at 2.3   On review of systems, pt reports new constipation, smaller caliber stools, dark stools, unexpected weight loss, RUQ pain, new fatigue, and denies vomiting, cough, new SOB, changes in breathing, new bone pains, leg swelling, blood in the urine, changes in urination, fevers, pain along the spine, and any other symptoms.   On PMHx the pt reports Gout, heart murmur, previous alcohol abuse. He denies any surgeries.  On Social Hx the pt reports that he hasn't consumed alcohol in the past 2 years whatsoever. He notes that alcohol abuse was previously a concern. He denies ever smoking cigarettes or doing drugs. He denies any radiation or chemical exposure.  On Family  Hx the pt reports mother with pancreatic cancer in 18s, maternal aunts with high blood pressure and heart failure, maternal aunt with breast cancer.  Interval History:  [name] returns today for management, evaluation, during C1 treatment of his newly diagnosed High grade neuroendocrine carcinoma. The patient's last visit with [place] was on 10/19/18. He is accompanied today by his wife, sister, and his brother is on the phone. The pt reports that he is doing well  overall.  The pt began C1 Carboplatin and Etoposide on 11/04/18. The pt obtained his neulasta injection on 11/08/18.   The pt reports that he is eating better since his last visit, and he is now probably eating about 50% of what he used to eat. He is not consuming Boost or Ensure. The pt notes that he has moved to a plant based diet. He notes that his abdominal pain is controlled and is "much better," and he has not had to use very much of his pain medication. The pt notes that he is moving his bowels well.  The pt notes that his left leg has been swollen in the last two weeks, and presented to the ED on 10/22/18 for this, and his [place] was negative for any clots.   Of note since the patient's last visit, pt has had an MRI Brain completed on 10/26/18 with results revealing No acute abnormality and negative for metastatic disease.  Lab results today (11/10/18) of CBC w/diff and CMP is as follows: all values are WNL except for WBC at 11.3k, RBC at 4.18, HGB at 11.4, HCT at 35.7, PLT at 57k, Sodium at 134, Calcium at 7.7, Total Protein at 6.4, Albumin at 2.6, Alk Phos at 203. 11/10/18 LDH at 228  On review of systems, pt reports left ankle swelling, controlled abdominal pains, good energy levels, eating better, moving his bowels well, and denies calf pain, SOB, and any other symptoms.   MEDICAL HISTORY:  Past Medical History:  Diagnosis Date  . Arthritis    "knees; left hand"  . Cancer ([place])    leukemia  . Excessive drinking alcohol    Admitted to 2-3 40oz beers every other night 02/2012  . Gout   . Heart murmur    Longstanding. 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 Hypertension   . Near syncope    05/2011 with tachypalpitations & with echo showing mild LVH, normal EF 55-60%, trivial AR    SURGICAL HISTORY: Past Surgical History:  Procedure Laterality Date  . IR IMAGING GUIDED PORT INSERTION  10/26/2018  . NO PAST SURGERIES      SOCIAL HISTORY: Social History    Socioeconomic History  . Marital status: Married    Spouse name: Not on file  . Number of children: Not on file  . Years of education: Not on file  . Highest education level: Not on file  Occupational History  . Not on file  Social Needs  . Financial resource strain: Not on file  . Food insecurity:    Worry: Not on file    Inability: Not on file  . Transportation needs:    Medical: Not on file    Non-medical: Not on file  Tobacco Use  . Smoking status: Never Smoker  . Smokeless tobacco: Never Used  Substance and Sexual Activity  . Alcohol use: Not Currently    Comment: states he has cut back/ every now and again  . Drug use: No  . Sexual activity:  Yes  Lifestyle  . Physical activity:    Days per week: Not on file    Minutes per session: Not on file  . Stress: Not on file  Relationships  . Social connections:    Talks on phone: Not on file    Gets together: Not on file    Attends religious service: Not on file    Active member of club or organization: Not on file    Attends meetings of clubs or organizations: Not on file    Relationship status: Not on file  . Intimate partner violence:    Fear of current or ex partner: Not on file    Emotionally abused: Not on file    Physically abused: Not on file    Forced sexual activity: Not on file  Other Topics Concern  . Not on file  Social History Narrative   Married. 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 Is from [place].     FAMILY HISTORY: Family History  Problem Relation Age of Onset  . Cancer Mother        Possibly pancreatic. Died at age 14    ALLERGIES:  has No Known Allergies.  MEDICATIONS:  Current Outpatient Medications  Medication Sig Dispense Refill  . lidocaine-prilocaine (EMLA) cream Apply to affected area once 30 g 3  . LORazepam (ATIVAN) 0.5 MG tablet Take 1 tablet (0.5 mg total) by mouth every 6 (six) hours as needed (Nausea or vomiting). 30 tablet  0  . ondansetron (ZOFRAN ODT) 4 MG disintegrating tablet Take 1 tablet (4 mg total) by mouth every 8 (eight) hours as needed for nausea or vomiting. 6 tablet 0  . ondansetron (ZOFRAN) 8 MG tablet Take 1 tablet (8 mg total) by mouth 2 (two) times daily as needed for refractory nausea / vomiting. Start on day 3 after carboplatin chemo. 30 tablet 1  . oxyCODONE (OXY IR/ROXICODONE) 5 MG immediate release tablet Take 1-2 tablets (5-10 mg total) by mouth every 4 (four) hours as needed for severe pain. 90 tablet 0  . prochlorperazine (COMPAZINE) 10 MG tablet Take 1 tablet (10 mg total) by mouth every 6 (six) hours as needed (Nausea or vomiting). 30 tablet 1  . colchicine 0.6 MG tablet Take 1 tablet (0.6 mg total) by mouth See admin instructions. Take 1.'2mg'$  (2 tablets) followed by .'6mg'$  (1 tablet) 1 hour later. (Patient not taking: Reported on 10/01/2018) 3 tablet 0  . dronabinol (MARINOL) 5 MG capsule Take 1 capsule (5 mg total) by mouth 2 (two) times daily before a meal. 60 capsule 0  . predniSONE (DELTASONE) 20 MG tablet Take 60 mg (three tablets) by mouth daily for 2 days, then '40mg'$  (two tablets) by mouth daily for 2 days, then '20mg'$  (one tablets) daily for 2 days (Patient not taking: Reported on 10/01/2018) 12 tablet 0   No current facility-administered medications for this visit.     REVIEW OF SYSTEMS:    A 10+ POINT REVIEW OF SYSTEMS WAS OBTAINED including neurology, dermatology, psychiatry, cardiac, respiratory, lymph, extremities, GI, GU, Musculoskeletal, constitutional, breasts, reproductive, HEENT.  All pertinent positives are noted in the HPI.  All others are negative.   PHYSICAL EXAMINATION: ECOG PERFORMANCE STATUS: 1 - Symptomatic but completely ambulatory  Vitals:   11/10/18 1537  BP: 114/66  Pulse: (!) 59  Resp: 18  Temp: 98.1 F (36.7 C)  SpO2: 100%   Filed Weights   11/10/18 1537  Weight: 148 lb 11.2 oz (  67.4 kg)   .Body mass index is 23.29 kg/m.  GENERAL:alert, in no acute  distress and comfortable SKIN: no acute rashes, no significant lesions EYES: conjunctiva are pink and non-injected, sclera anicteric OROPHARYNX: MMM, no exudates, no oropharyngeal erythema or ulceration NECK: supple, no JVD LYMPH:  no palpable lymphadenopathy in the cervical, axillary or inguinal regions LUNGS: clear to auscultation b/l with normal respiratory effort HEART: regular rate & rhythm ABDOMEN:  normoactive bowel sounds, tender over the RUQ. Palpable massive hepatomegaly.  Extremity: no pedal edema PSYCH: alert & oriented x 3 with fluent speech NEURO: no focal motor/sensory deficits  LABORATORY DATA:  I have reviewed the data as listed  . CBC Latest Ref Rng & Units 11/10/2018 11/04/2018 10/31/2018  WBC 4.0 - 10.5 K/uL 11.3(H) 6.5 7.8  Hemoglobin 13.0 - 17.0 g/dL 11.4(L) 13.2 13.0  Hematocrit 39.0 - 52.0 % 35.7(L) 41.1 40.9  Platelets 150 - 400 K/uL 57(L) 145(L) 163    . CMP Latest Ref Rng & Units 11/10/2018 11/04/2018 10/31/2018  Glucose 70 - 99 mg/dL 97 108(H) 102(H)  BUN 6 - 20 mg/dL '11 14 19  '$ Creatinine 0.61 - 1.24 mg/dL 0.63 0.96 0.87  Sodium 135 - 145 mmol/L 134(L) 133(L) 131(L)  Potassium 3.5 - 5.1 mmol/L 4.6 4.9 4.8  Chloride 98 - 111 mmol/L 105 100 98  CO2 22 - 32 mmol/L '23 23 23  '$ Calcium 8.9 - 10.3 mg/dL 7.7(L) 9.5 8.9  Total Protein 6.5 - 8.1 g/dL 6.4(L) 7.7 7.5  Total Bilirubin 0.3 - 1.2 mg/dL 1.1 1.6(H) 1.9(H)  Alkaline Phos 38 - 126 U/L 203(H) 276(H) 230(H)  AST 15 - 41 U/L 27 41 54(H)  ALT 0 - 44 U/L 19 30 32    10/08/18 Liver Biopsy:    RADIOGRAPHIC STUDIES: I have personally reviewed the radiological images as listed and agreed with the findings in the report. Dg Chest 2 View  Result Date: 10/31/2018 CLINICAL DATA:  Upper abdominal pain. Shortness of breath. Metastatic small cell carcinoma to the liver and bones. EXAM: CHEST - 2 VIEW COMPARISON:  Chest x-ray dated 10/01/2018 and PET-CT dated 10/15/2018 FINDINGS: New power port in place. The tip is at  the cavoatrial junction. Heart size and vascularity are normal. Lungs are clear. No discrete bone abnormality. No effusions. IMPRESSION: No active cardiopulmonary disease. Electronically Signed   By: Lorriane Shire M.D.   On: 10/31/2018 16:32   Ct Angio Chest Pe W/cm &/or Wo Cm  Result Date: 10/31/2018 CLINICAL DATA:  Abdominal pain and dyspnea starting this morning. EXAM: CT ANGIOGRAPHY CHEST CT ABDOMEN AND PELVIS WITH CONTRAST TECHNIQUE: Multidetector CT imaging of the chest was performed using the standard protocol during bolus administration of intravenous contrast. Multiplanar CT image reconstructions and MIPs were obtained to evaluate the vascular anatomy. Multidetector CT imaging of the abdomen and pelvis was performed using the standard protocol during bolus administration of intravenous contrast. CONTRAST:  140m ISOVUE-370 IOPAMIDOL (ISOVUE-370) INJECTION 76% COMPARISON:  PET CT 10/15/2018, CT abdomen and pelvis 10/01/2018 FINDINGS: CTA CHEST FINDINGS Cardiovascular: Satisfactory opacification of the pulmonary arteries to the segmental level. No evidence of pulmonary embolism. Normal heart size. No pericardial effusion. Mediastinum/Nodes: Common arterial branch point of the right brachiocephalic and left common carotid arteries. No aneurysm or dissection. Nonaneurysmal thoracic aorta. No dissection is noted. No mediastinal or hilar lymphadenopathy. The CT appearance of the esophagus is unremarkable. Midline patent trachea. Patent mainstem bronchi. Lungs/Pleura: No dominant mass or pulmonary consolidation. Redemonstration of tiny 3 mm or less  right upper lobe pulmonary nodules as seen on PET-CT. No significant progression. No effusion. Minimal right basilar atelectasis. No pneumothorax. Musculoskeletal: No aggressive osseous lesions. No acute fracture. Port catheter is noted overlying the anterior right chest wall leads projecting into right atrium. Review of the MIP images confirms the above findings.  CT ABDOMEN and PELVIS FINDINGS Hepatobiliary: Redemonstration of too numerous to count hypodense liver masses consistent with metastasis. Gallbladder is unremarkable and free of stones. There is a stable cyst in the right hepatic lobe measuring 1.8 cm. Pancreas: 12 mm hypodense lesion in the pancreatic tail is only minimally enlarged from 10 mm previously. No ductal dilatation or inflammation. Spleen: No splenomegaly. Scattered calcified granulomata are noted. Adrenals/Urinary Tract: Normal bilateral adrenal glands. Indeterminate mass arising off the upper pole the right kidney measuring up to 1.7 cm in diameter is without significant progression. No additional lesions are noted. No nephrolithiasis nor hydroureteronephrosis. Stomach/Bowel: The stomach is decompressed in appearance. No bowel obstruction or inflammation. Moderate stool retention is seen within the colon. No annular constricting lesions are identified.Normal caliber appendix with appendicular with measuring 6 mm is identified. Vascular/Lymphatic: Atherosclerosis of the abdominal aorta without aneurysm. Patent branch vessels. The splenic and portal veins are patent. Reproductive: Normal size prostate. Other: Trace free fluid in the pelvis. Mild soft tissue anasarca. Musculoskeletal: No aggressive osteo lytic or blastic lesion. 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 Small sclerotic focus in the left iliac bone is indeterminate stable and more likely represents a bone island. Review of the MIP images confirms the above findings. IMPRESSION: 1. No acute pulmonary embolus, aortic aneurysm or dissection. 2. Stable tiny 3 mm or less right upper lobe pulmonary nodules. 3. Redemonstration of too numerous to count hypodense liver masses consistent with metastasis. 4. Indeterminate mass arising off the upper pole the right kidney measuring up to 1.7 cm in diameter without significant progression. 5. Indeterminate 12 mm hypodense lesion in the  pancreatic tail only minimally enlarged from 10 mm previously. 6. Atherosclerosis of the abdominal aorta without aneurysm or dissection. Electronically Signed   By: Ashley Royalty M.D.   On: 10/31/2018 18:16   Mr Jeri Cos SH Contrast  Result Date: 10/26/2018 CLINICAL DATA:  Metastatic small-cell carcinoma unknown primary. Staging. Bony metastatic disease. EXAM: MRI HEAD WITHOUT AND WITH CONTRAST TECHNIQUE: Multiplanar, multiecho pulse sequences of the brain and surrounding structures were obtained without and with intravenous contrast. CONTRAST:  7 mL Gadovist IV COMPARISON:  None. FINDINGS: Brain: No acute infarction, hemorrhage, hydrocephalus, extra-axial collection or mass lesion. Normal enhancement. No enhancing mass lesion. Leptomeningeal enhancement is normal. Vascular: Normal arterial flow voids Skull and upper cervical spine: No focal bone marrow lesion. Sinuses/Orbits: Negative Other: None IMPRESSION: No acute abnormality and negative for metastatic disease. Electronically Signed   By: Franchot Gallo M.D.   On: 10/26/2018 13:36   Ct Abdomen Pelvis W Contrast  Result Date: 10/31/2018 CLINICAL DATA:  Abdominal pain and dyspnea starting this morning. EXAM: CT ANGIOGRAPHY CHEST CT ABDOMEN AND PELVIS WITH CONTRAST TECHNIQUE: Multidetector CT imaging of the chest was performed using the standard protocol during bolus administration of intravenous contrast. Multiplanar CT image reconstructions and MIPs were obtained to evaluate the vascular anatomy. Multidetector CT imaging of the abdomen and pelvis was performed using the standard protocol during bolus administration of intravenous contrast. CONTRAST:  162m ISOVUE-370 IOPAMIDOL (ISOVUE-370) INJECTION 76% COMPARISON:  PET CT 10/15/2018, CT abdomen and pelvis 10/01/2018 FINDINGS: CTA CHEST FINDINGS Cardiovascular: Satisfactory opacification of the pulmonary arteries  to the segmental level. No evidence of pulmonary embolism. Normal heart size. No pericardial  effusion. Mediastinum/Nodes: Common arterial branch point of the right brachiocephalic and left common carotid arteries. No aneurysm or dissection. Nonaneurysmal thoracic aorta. No dissection is noted. No mediastinal or hilar lymphadenopathy. The CT appearance of the esophagus is unremarkable. Midline patent trachea. Patent mainstem bronchi. Lungs/Pleura: No dominant mass or pulmonary consolidation. Redemonstration of tiny 3 mm or less right upper lobe pulmonary nodules as seen on PET-CT. No significant progression. No effusion. Minimal right basilar atelectasis. No pneumothorax. Musculoskeletal: No aggressive osseous lesions. No acute fracture. Port catheter is noted overlying the anterior right chest wall leads projecting into right atrium. Review of the MIP images confirms the above findings. CT ABDOMEN and PELVIS FINDINGS Hepatobiliary: Redemonstration of too numerous to count hypodense liver masses consistent with metastasis. Gallbladder is unremarkable and free of stones. There is a stable cyst in the right hepatic lobe measuring 1.8 cm. Pancreas: 12 mm hypodense lesion in the pancreatic tail is only minimally enlarged from 10 mm previously. No ductal dilatation or inflammation. Spleen: No splenomegaly. Scattered calcified granulomata are noted. Adrenals/Urinary Tract: Normal bilateral adrenal glands. Indeterminate mass arising off the upper pole the right kidney measuring up to 1.7 cm in diameter is without significant progression. No additional lesions are noted. No nephrolithiasis nor hydroureteronephrosis. Stomach/Bowel: The stomach is decompressed in appearance. No bowel obstruction or inflammation. Moderate stool retention is seen within the colon. No annular constricting lesions are identified.Normal caliber appendix with appendicular with measuring 6 mm is identified. Vascular/Lymphatic: Atherosclerosis of the abdominal aorta without aneurysm. Patent branch vessels. The splenic and portal veins are  patent. Reproductive: Normal size prostate. Other: Trace free fluid in the pelvis. Mild soft tissue anasarca. Musculoskeletal: No aggressive osteo lytic or blastic lesion. 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 Small sclerotic focus in the left iliac bone is indeterminate stable and more likely represents a bone island. Review of the MIP images confirms the above findings. IMPRESSION: 1. No acute pulmonary embolus, aortic aneurysm or dissection. 2. Stable tiny 3 mm or less right upper lobe pulmonary nodules. 3. Redemonstration of too numerous to count hypodense liver masses consistent with metastasis. 4. Indeterminate mass arising off the upper pole the right kidney measuring up to 1.7 cm in diameter without significant progression. 5. Indeterminate 12 mm hypodense lesion in the pancreatic tail only minimally enlarged from 10 mm previously. 6. Atherosclerosis of the abdominal aorta without aneurysm or dissection. Electronically Signed   By: Ashley Royalty M.D.   On: 10/31/2018 18:16   Nm Pet Image Initial (pi) Skull Base To Thigh  Result Date: 10/15/2018 CLINICAL DATA:  Initial treatment strategy for numerous metastatic liver lesions. EXAM: NUCLEAR MEDICINE PET SKULL BASE TO THIGH TECHNIQUE: 8.1 mCi F-18 FDG was injected intravenously. Full-ring PET imaging was performed from the skull base to thigh after the radiotracer. CT data was obtained and used for attenuation correction and anatomic localization. Fasting blood glucose: 74 mg/dl COMPARISON:  CT scan 10/01/2018 FINDINGS: Mediastinal blood pool activity: SUV max 1.48 NECK: No hypermetabolic lymph nodes in the neck. Incidental CT findings: none CHEST: No worrisome pulmonary lesion to suggest a primary lung neoplasm. No enlarged or hypermetabolic mediastinal or hilar lymph nodes. There are 2 sub 3 mm right upper lobe pulmonary nodules which are likely benign but attention on future scans is suggested. No enlarged or hypermetabolic  supraclavicular or axillary lymph nodes. Incidental CT findings: none ABDOMEN/PELVIS: Diffuse hepatic metastatic disease with  innumerable lesions throughout the enlarged liver. SUV max is 9.62. Scattered splenic calcified granulomas but no splenic lesions or enlargement. No adrenal gland lesions or hypermetabolism. Upper pole right renal lesion is difficult to identify on this study and is better seen on the recent CT scan. No obvious hypermetabolism. MRI abdomen without and with contrast may be helpful for further evaluation of this lesion. This lesion was also present dating back to a CT scan from 2012 but has enlarged. I think it is unlikely the cause of the patient's metastatic disease. No colonic mass or hypermetabolism is identified. No enlarged or hypermetabolic mesenteric or retroperitoneal lymph nodes. Incidental CT findings: none SKELETON: Diffuse osseous metastatic disease involving the axial and appendicular skeleton. Left humeral head/neck lesion has an SUV max of 9.99. T11 lesion has a SUV max of 7.46. Left sacral lesion has a SUV max of 8.87 Left femoral head lesion has an SUV max of 4.86. Incidental CT findings: none IMPRESSION: 1. Diffuse hepatic and osseous metastatic disease without obvious primary neoplasm. 2. Upper pole right renal lesion is not definitely hypermetabolic and has been present since 2012. I think is unlikely the cause of the metastatic disease. Recommend liver biopsy for tissue diagnosis. If needed, an MRI of the abdomen without and with contrast may be helpful for further evaluation of the renal lesion. 3. Two sub 3 mm right upper lobe pulmonary nodules are indeterminate. Electronically Signed   By: Marijo Sanes M.D.   On: 10/15/2018 17:05   Ir Imaging Guided Port Insertion  Result Date: 10/26/2018 INDICATION: History of metastatic neuroendocrine cancer. In need of durable intravenous access for chemotherapy administration. EXAM: IMPLANTED PORT A CATH PLACEMENT WITH  ULTRASOUND AND FLUOROSCOPIC GUIDANCE COMPARISON:  PET-CT-10/15/2018 MEDICATIONS: Ancef 2 gm IV; The antibiotic was administered within an appropriate time interval prior to skin puncture. ANESTHESIA/SEDATION: Moderate (conscious) sedation was employed during this procedure. A total of Versed 3 mg and Fentanyl 100 mcg was administered intravenously. Moderate Sedation Time: 27 minutes. The patient's level of consciousness and vital signs were monitored continuously by radiology nursing throughout the procedure under my direct supervision. CONTRAST:  None FLUOROSCOPY TIME:  16 seconds (4.7 mGy) COMPLICATIONS: None immediate. PROCEDURE: The procedure, risks, benefits, and alternatives were explained to the patient. Questions regarding the procedure were encouraged and answered. The patient understands and consents to the procedure. The right neck and chest were prepped with chlorhexidine in a sterile fashion, and a sterile drape was applied covering the operative field. Maximum barrier sterile technique with sterile gowns and gloves were used for the procedure. A timeout was performed prior to the initiation of the procedure. Local anesthesia was provided with 1% lidocaine with epinephrine. After creating a small venotomy incision, a micropuncture kit was utilized to access the internal jugular vein. Real-time ultrasound guidance was utilized for vascular access including the acquisition of a permanent ultrasound image documenting patency of the accessed vessel. The microwire was utilized to measure appropriate catheter length. A subcutaneous port pocket was then created along the upper chest wall utilizing a combination of sharp and blunt dissection. The pocket was irrigated with sterile saline. A single lumen thin power injectable port was chosen for placement. The 8 Fr catheter was tunneled from the port pocket site to the venotomy incision. The port was placed in the pocket. The external catheter was trimmed to  appropriate length. At the venotomy, an 8 Fr peel-away sheath was placed over a guidewire under fluoroscopic guidance. The catheter was then placed through  the sheath and the sheath was removed. Final catheter positioning was confirmed and documented with a fluoroscopic spot radiograph. The port was accessed with a Huber needle, aspirated and flushed with heparinized saline. The venotomy site was closed with an interrupted 4-0 Vicryl suture. The port pocket incision was closed with interrupted 2-0 Vicryl suture and the skin was opposed with a running subcuticular 4-0 Vicryl suture. Dermabond and Steri-strips were applied to both incisions. Dressings were placed. The patient tolerated the procedure well without immediate post procedural complication. FINDINGS: After catheter placement, the tip lies within the superior cavoatrial junction. The catheter aspirates and flushes normally and is ready for immediate use. IMPRESSION: Successful placement of a right internal jugular approach power injectable Port-A-Cath. The catheter is ready for immediate use. Electronically Signed   By: Sandi Mariscal M.D.   On: 10/26/2018 10:26   Vas [place] Lower Extremity Venous (dvt)  Result Date: 10/23/2018  Lower Venous Study Indications: Swelling in the left foot.  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 Anticoagulation: Aspirin. Performing Technologist: Guinevere Ferrari RVT  Examination Guidelines: A complete evaluation includes B-mode imaging, spectral Doppler, color Doppler, and power Doppler as needed of all accessible portions of each vessel. Bilateral testing is considered an integral part of a complete examination. Limited examinations for reoccurring indications may be performed as noted.  Right Venous Findings: +---------+---------------+---------+-----------+----------+-------+          CompressibilityPhasicitySpontaneityPropertiesSummary  +---------+---------------+---------+-----------+----------+-------+ CFV      Full           Yes      Yes                          +---------+---------------+---------+-----------+----------+-------+ SFJ      Full           Yes      Yes                          +---------+---------------+---------+-----------+----------+-------+ FV Prox  Full           Yes      Yes                          +---------+---------------+---------+-----------+----------+-------+ FV Mid   Full                                                 +---------+---------------+---------+-----------+----------+-------+ FV DistalFull           Yes      Yes                          +---------+---------------+---------+-----------+----------+-------+ PFV      Full                                                 +---------+---------------+---------+-----------+----------+-------+ POP      Full           Yes      Yes                          +---------+---------------+---------+-----------+----------+-------+  PTV      Full           Yes      Yes                          +---------+---------------+---------+-----------+----------+-------+ PERO     Full           Yes      Yes                          +---------+---------------+---------+-----------+----------+-------+ Gastroc  Full                                                 +---------+---------------+---------+-----------+----------+-------+ GSV      Full           Yes      Yes                          +---------+---------------+---------+-----------+----------+-------+ Rouleaux flow noted in the CFV, greater saphenous vein and popliteal vein. Incompetent valves noted in the proximal greater saphenous vein with possible superficial thrombus trying to form behind the valve flaps. Inguinal lymph nodes noted.  Left Venous Findings: +---------+---------------+---------+-----------+----------+-------+           CompressibilityPhasicitySpontaneityPropertiesSummary +---------+---------------+---------+-----------+----------+-------+ CFV      Full           Yes      Yes                          +---------+---------------+---------+-----------+----------+-------+ SFJ      Full           Yes      Yes                          +---------+---------------+---------+-----------+----------+-------+ FV Prox  Full           Yes      Yes                          +---------+---------------+---------+-----------+----------+-------+ FV Mid   Full                                                 +---------+---------------+---------+-----------+----------+-------+ FV DistalFull           Yes      Yes                          +---------+---------------+---------+-----------+----------+-------+ PFV      Full                                                 +---------+---------------+---------+-----------+----------+-------+ POP      Full           Yes      Yes                          +---------+---------------+---------+-----------+----------+-------+  PTV      Full           Yes      Yes                          +---------+---------------+---------+-----------+----------+-------+ PERO     Full           Yes      Yes                          +---------+---------------+---------+-----------+----------+-------+ Gastroc  Full                                                 +---------+---------------+---------+-----------+----------+-------+ GSV      Full           Yes      Yes                          +---------+---------------+---------+-----------+----------+-------+ Rouleaux flow noted in the CFV, femoral vein, profunda vein, gastrocnemius and popliteal vein. Incompetent valves noted in the proximal femoral vein. Unable to determine if thrombus is trying to form behind the valve flaps. Inguinal lymph nodes noted.   Findings reported to Dr. Debara Pickett, Doctor of the  Day, who let the patient go home at 1:05  Summary: Right: No evidence of deep vein thrombosis in the lower extremity. No indirect evidence of obstruction proximal to the inguinal ligament. No cystic structure found in the popliteal fossa. Rouleaux flow noted in the CFV, greater saphenous vein and popliteal vein. Incompetent valves noted in the proximal greater saphenous vein with possible superficial thrombus trying to form behind the valve flaps. Ultrasound characteristics of enlarged lymph nodes are noted in the groin. Left: No evidence of deep vein thrombosis in the lower extremity. No indirect evidence of obstruction proximal to the inguinal ligament. No cystic structure found in the popliteal fossa. Rouleaux flow noted in the CFV, femoral vein, profunda vein, gastrocnemius and popliteal vein. Incompetent valves noted in the proximal femoral vein. Unable to determine if thrombus is trying to form behind the valve flaps. Ultrasound characteristics of enlarged lymph nodes noted in the groin.  *See table(s) above for measurements and observations. CC: Dr. Lia Hopping, Oncologist Suggest repeat Lower Extremity Venous Duplex. Electronically signed by Jenkins Rouge MD on 10/23/2018 at 3:25:17 PM.    Final     ASSESSMENT & PLAN:  58 y.o. male with  1. 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 There is a 2.5 cm enhancing mass over the upper pole cortex of the right kidney suspicious for renal cell carcinoma. 2.  No acute findings in the abdomen/pelvis. 3. 1 cm hypodensity over the pancreatic tail not well seen on previous noncontrast exams. MR may be helpful for further evaluation on an elective basis.    10/06/18 Hep B and Hep C were negative   10/06/18 CA19-9 elevated at 652, AFP slightly elevated at 8.8, CEA normal at 2.13, LDH  slightly elevated at 215  10/08/18 US Liver Biopsy which revealed High Grade Neuroendocrine Carcinoma  10/15/18 PET/CT revealed Diffuse hepatic and osseous metastatic disease without obvious primary neoplasm. 2. Upper  pole right renal lesion is not definitely hypermetabolic and has been present since 2012. I think is unlikely the cause of the metastatic disease. Recommend liver biopsy for tissue diagnosis. If needed, an MRI of the abdomen without and with contrast may be helpful for further evaluation of the renal lesion. 3. Two sub 3 mm right upper lobe pulmonary nodules are indeterminate.   2. Abnormal LFTs - likely from liver metastases  Labs upon initial presentation from 10/06/18, some borderline monocytosis at 1.2k, otherwise normal blood counts, AST at 77, ALT at 63, Alk Phos at 392, Total Bilirubin at 2.3   3. Rt renal mass - concerning for RCC  PLAN:  -Discussed pt labwork today, 11/10/18; mild anemia with HGB at 11.4, thrombocytopenia with PLT at 57k, will watch PLT closely and will dose adjust etoposide if necessary. Transaminases and Total Bilirubin have normalized. Alk Phos decreased to 203. Albumin at 2.6.  -Will watch labs once a week during first cycle  -Discussed the 10/26/18 MRI Brain which revealed No acute abnormality and negative for metastatic disease. -Advised that the pt do his best to continue eating better -Referral to our nutritional therapist Ernestene Kiel -Begin high protein and high calorie meal supplements such as Boost or Ensure -Will order Marinol as well for appetite stimulation  -Begin using compression socks and keep feet elevated -The pt has no prohibitive toxicities from continuing C1 Carboplatin and Etoposide with G-CSF support at this time.  -Previously discussed that the PET/CT did not reveal an obvious primary but I do suspect primary is small bowel -Previously discussed that the staging indicated is Stage IV, and that the treatment has palliative intent  and will therefore not be curative  -Plan for Carboplatin and Etoposide every 3 weeks, with G-CSF support for up to 6 cycles -Did provide pt with contact information to Social Work  -Strongly recommended that the pt not lift, push, or pull anything to prevent the risk of pathologic fractures as the PET/CT does indicate bone involvement -Continue Xgeva every 4 weeks -Previously discussed that there may be a role to refer the pt to Rad Onc for RT for local pain control -Did refer the pt to our nutritional therapist -Again encouraged the patient to do his best to eat well, smaller meals more frequently -Oxycodone for pain mx, up to every 6 hours -Recommended that the pt continue to eat well, drink at least 48-64 oz of water each day, and walk 20-30 minutes each day. -Will see the pt back on 11/25/18   -Labs on Monday 11/15/2018 and 11/19/2018 to closely monitor platelets and for transfusion needs -Plz schedule C2 of Carboplatin/Etoposide as per orders on 1/23, 1/24, 1/25 and neulasta on 1/27. -RTC with Dr Irene Limbo with labs on 11/25/2018 -Port flush with each lab appointment -nutritional therapy referral to Ernestene Kiel for severe protein calorie malnutrition    All of the patients questions were answered with apparent satisfaction. The patient knows to call the clinic with any problems, questions or concerns.  The total time spent in the appt was 40 minutes and more than 50% was on counseling and direct patient cares.    Sullivan Lone MD MS AAHIVMS [facility] Hematology/Oncology Physician [facility]  (Office):       [phone] (Work cell):  [phone] (Fax):           [phone]  11/10/2018 4:30 PM  I, Baldwin Jamaica, am acting as a scribe for Dr. Sullivan Lone.   .I have reviewed  the above documentation for accuracy and completeness, and I agree with the above. Brunetta Genera MD

## 2018-11-10 NOTE — Patient Instructions (Signed)
Thank you for choosing [facility] to provide your oncology and hematology care.  To afford each patient quality time with our providers, please arrive 30 minutes before your scheduled appointment time.  If you arrive late for your appointment, you may be asked to reschedule.  We strive to give you quality time with our providers, and arriving late affects you and other patients whose appointments are after yours.    If you are a no show for multiple scheduled visits, you may be dismissed from the clinic at the providers discretion.     Again, thank you for choosing [facility], our hope is that these requests will decrease the amount of time that you wait before being seen by our physicians.  ______________________________________________________________________   Should you have questions after your visit to the [facility], please contact our office at [phone] between the hours of 8:30 and 4:30 p.m.    Voicemails left after 4:30p.m will not be returned until the following business day.     For prescription refill requests, please have your pharmacy contact us directly.  Please also try to allow 48 hours for prescription requests.     Please contact the scheduling department for questions regarding scheduling.  For scheduling of procedures such as PET scans, CT scans, MRI, Ultrasound, etc please contact central scheduling at [phone].     Resources For Cancer Patients and Caregivers:    Oncolink.org:  A wonderful resource for patients and healthcare providers for information regarding your disease, ways to tract your treatment, what to expect, etc.      American Cancer Society:  800-227-2345  Can help patients locate various types of support and financial assistance   Cancer Care: 1-800-813-HOPE (4673) Provides financial assistance, online support groups, medication/co-pay assistance.     [place] DSS:  336-641-3447 Where to apply  for food stamps, Medicaid, and utility assistance   Medicare Rights Center: 800-333-4114 Helps people with Medicare understand their rights and benefits, navigate the Medicare system, and secure the quality healthcare they deserve   SCAT: 336-333-6589 [place] [employer]'s shared-ride transportation service for eligible riders who have a disability that prevents them from riding the fixed route bus.     For additional information on assistance programs please contact our social worker:   [name]:  [phone]  

## 2018-11-11 ENCOUNTER — Telehealth: Payer: Self-pay | Admitting: Hematology

## 2018-11-11 NOTE — Telephone Encounter (Signed)
Spoke with the patient about scheduled appointments, patient stated she will stop by and get a calendar of the appointment on 01/13.

## 2018-11-15 ENCOUNTER — Inpatient Hospital Stay: Payer: Medicaid Other

## 2018-11-15 ENCOUNTER — Inpatient Hospital Stay: Payer: Medicaid Other | Admitting: Nutrition

## 2018-11-15 DIAGNOSIS — Z5111 Encounter for antineoplastic chemotherapy: Secondary | ICD-10-CM | POA: Diagnosis not present

## 2018-11-15 DIAGNOSIS — C787 Secondary malignant neoplasm of liver and intrahepatic bile duct: Secondary | ICD-10-CM

## 2018-11-15 DIAGNOSIS — Z7189 Other specified counseling: Secondary | ICD-10-CM

## 2018-11-15 DIAGNOSIS — C801 Malignant (primary) neoplasm, unspecified: Principal | ICD-10-CM

## 2018-11-15 DIAGNOSIS — Z95828 Presence of other vascular implants and grafts: Secondary | ICD-10-CM | POA: Insufficient documentation

## 2018-11-15 DIAGNOSIS — C7951 Secondary malignant neoplasm of bone: Secondary | ICD-10-CM

## 2018-11-15 DIAGNOSIS — D696 Thrombocytopenia, unspecified: Secondary | ICD-10-CM

## 2018-11-15 LAB — CBC WITH DIFFERENTIAL/PLATELET
Abs Immature Granulocytes: 0.42 10*3/uL — ABNORMAL HIGH (ref 0.00–0.07)
Basophils Absolute: 0 10*3/uL (ref 0.0–0.1)
Basophils Relative: 1 %
Eosinophils Absolute: 0 10*3/uL (ref 0.0–0.5)
Eosinophils Relative: 1 %
HEMATOCRIT: 33.4 % — AB (ref 39.0–52.0)
Hemoglobin: 10.6 g/dL — ABNORMAL LOW (ref 13.0–17.0)
Immature Granulocytes: 12 %
Lymphocytes Relative: 42 %
Lymphs Abs: 1.6 10*3/uL (ref 0.7–4.0)
MCH: 27.2 pg (ref 26.0–34.0)
MCHC: 31.7 g/dL (ref 30.0–36.0)
MCV: 85.9 fL (ref 80.0–100.0)
Monocytes Absolute: 0.8 10*3/uL (ref 0.1–1.0)
Monocytes Relative: 23 %
NEUTROS ABS: 0.8 10*3/uL — AB (ref 1.7–7.7)
Neutrophils Relative %: 21 %
Platelets: 58 10*3/uL — ABNORMAL LOW (ref 150–400)
RBC: 3.89 MIL/uL — ABNORMAL LOW (ref 4.22–5.81)
RDW: 12.8 % (ref 11.5–15.5)
WBC: 3.6 10*3/uL — ABNORMAL LOW (ref 4.0–10.5)
nRBC: 0.5 % — ABNORMAL HIGH (ref 0.0–0.2)

## 2018-11-15 LAB — SAMPLE TO BLOOD BANK

## 2018-11-15 MED ORDER — SODIUM CHLORIDE 0.9% FLUSH
10.0000 mL | INTRAVENOUS | Status: DC | PRN
  Administered 2018-11-15: 10 mL
  Filled 2018-11-15: qty 10

## 2018-11-15 MED ORDER — HEPARIN SOD (PORK) LOCK FLUSH 100 UNIT/ML IV SOLN
500.0000 [IU] | Freq: Once | INTRAVENOUS | Status: AC | PRN
  Administered 2018-11-15: 500 [IU]
  Filled 2018-11-15: qty 5

## 2018-11-15 NOTE — Progress Notes (Signed)
58 year old male diagnosed with metastatic small cell carcinoma with liver metastases. He is a patient of Dr. Irene Limbo.  Past medical history includes hypertension, heart murmur, gout, excessive alcohol intake, and leukemia.  Medications include Marinol, Ativan, Zofran, prednisone, and Compazine.  Labs include sodium 134 and albumin 2.6 on January 8.  Height: 5 feet 7 inches. Weight: 148.7 pounds January 8. Usual body weight: 200 pounds in April 2019. BMI: 23.29.  Estimated nutrition needs: 2350-2550 cal, 102-120 g protein, 2.4 L fluid.  Patient reports he has stopped eating a lot of junk food and is now trying to consume a plant-based diet. He denies nausea, vomiting, constipation, diarrhea. He endorses approximate 52 pound weight loss over 9 months. He is consuming 3 small meals daily. He has started to try oral nutrition supplements. Dietary recall reveals patient is consuming approximately 1000 cal daily.  Nutrition diagnosis:  Severe malnutrition in the context of chronic illness secondary to 26% weight loss over 9 months, less than 75% energy intake for greater than 1 month, and severe depletion of body fat and muscle mass on physical exam.  Intervention: Educated patient to consume smaller more frequent meals and snacks with higher calorie, higher protein foods. Reviewed the importance of protein intake at every meal and provided patient with specific examples that were plant-based. Recommended patient add to high-calorie, high-protein oral nutrition supplements daily between meals.  I provided samples and coupons. Provided fact sheets.  Questions were answered.  Teach back method used.  Contact information provided.  Monitoring, evaluation, goals: Patient will tolerate increased calories and protein to minimize further weight loss.  Next visit: Thursday, January 23 during infusion.  **Disclaimer: This note was dictated with voice recognition software. Similar sounding words  can inadvertently be transcribed and this note may contain transcription errors which may not have been corrected upon publication of note.**

## 2018-11-19 ENCOUNTER — Inpatient Hospital Stay: Payer: Medicaid Other

## 2018-11-19 DIAGNOSIS — Z95828 Presence of other vascular implants and grafts: Secondary | ICD-10-CM

## 2018-11-19 DIAGNOSIS — D696 Thrombocytopenia, unspecified: Secondary | ICD-10-CM

## 2018-11-19 DIAGNOSIS — C7951 Secondary malignant neoplasm of bone: Secondary | ICD-10-CM

## 2018-11-19 DIAGNOSIS — C787 Secondary malignant neoplasm of liver and intrahepatic bile duct: Secondary | ICD-10-CM

## 2018-11-19 DIAGNOSIS — Z7189 Other specified counseling: Secondary | ICD-10-CM

## 2018-11-19 DIAGNOSIS — Z5111 Encounter for antineoplastic chemotherapy: Secondary | ICD-10-CM | POA: Diagnosis not present

## 2018-11-19 DIAGNOSIS — C801 Malignant (primary) neoplasm, unspecified: Principal | ICD-10-CM

## 2018-11-19 LAB — CBC WITH DIFFERENTIAL/PLATELET
Abs Immature Granulocytes: 7.06 10*3/uL — ABNORMAL HIGH (ref 0.00–0.07)
BASOS ABS: 0 10*3/uL (ref 0.0–0.1)
Basophils Relative: 0 %
Eosinophils Absolute: 0.1 10*3/uL (ref 0.0–0.5)
Eosinophils Relative: 0 %
HCT: 31.7 % — ABNORMAL LOW (ref 39.0–52.0)
Hemoglobin: 10.3 g/dL — ABNORMAL LOW (ref 13.0–17.0)
Immature Granulocytes: 19 %
Lymphocytes Relative: 8 %
Lymphs Abs: 3.1 10*3/uL (ref 0.7–4.0)
MCH: 27.8 pg (ref 26.0–34.0)
MCHC: 32.5 g/dL (ref 30.0–36.0)
MCV: 85.4 fL (ref 80.0–100.0)
Monocytes Absolute: 3 10*3/uL — ABNORMAL HIGH (ref 0.1–1.0)
Monocytes Relative: 8 %
NEUTROS ABS: 24.4 10*3/uL — AB (ref 1.7–7.7)
Neutrophils Relative %: 65 %
Platelets: 53 10*3/uL — ABNORMAL LOW (ref 150–400)
RBC: 3.71 MIL/uL — ABNORMAL LOW (ref 4.22–5.81)
RDW: 13 % (ref 11.5–15.5)
WBC: 37.7 10*3/uL — ABNORMAL HIGH (ref 4.0–10.5)
nRBC: 0.1 % (ref 0.0–0.2)

## 2018-11-19 LAB — SAMPLE TO BLOOD BANK

## 2018-11-19 MED ORDER — SODIUM CHLORIDE 0.9% FLUSH
10.0000 mL | INTRAVENOUS | Status: DC | PRN
  Administered 2018-11-19: 10 mL
  Filled 2018-11-19: qty 10

## 2018-11-19 MED ORDER — HEPARIN SOD (PORK) LOCK FLUSH 100 UNIT/ML IV SOLN
500.0000 [IU] | Freq: Once | INTRAVENOUS | Status: AC | PRN
  Administered 2018-11-19: 500 [IU]
  Filled 2018-11-19: qty 5

## 2018-11-22 ENCOUNTER — Other Ambulatory Visit: Payer: Self-pay

## 2018-11-22 ENCOUNTER — Ambulatory Visit: Payer: Self-pay | Admitting: Hematology

## 2018-11-22 NOTE — Progress Notes (Signed)
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  HISTORY OF PRESENTING ILLNESS:   [name] is a wonderful 58 y.o. male who has been referred to [place] by Dr. Francine Graven for evaluation and management of Liver masses. He is accompanied today by his wife and aunt. The pt reports that he is doing well overall.   The pt presented to the ED on 10/01/18 regarding persistent RUQ abdominal pain which began a month prior, and noted associations with nausea and 40 pound weight loss. He was evaluated with a CT A/P, as noted below, which revealed liver masses.   The pt reports that he first began feeling RUQ abdominal pain about 3-4 weeks ago, but began losing weight about 2 months ago. He notes that he began feeling more fatigue about 2 months ago as well.  The pt notes that the color of his bowels became dark black and denies taking Iron pills or bismuth medications. He notes that he began feeling constipated as well, feeling backed up. He notes that his stools began looking thinner in caliber. The pt notes that his appetite began decreasing over the last 3-4 weeks ago, and some of this he attributes to intermittent nausea. He denies problems swallowing food. He endorses RUQ pain, and denies vomiting and new cough, new bone pains, testicular pain or swelling, problems passing urine, blood in the urine. The pt has never had a colonoscopy and does not have a PCP, and cites his lack of health insurance as the reason.  The pt was released from the ED with Oxycodone and has been using this to treat his RUQ pain successfully.   The pt notes gout and a heart murmur as previous medical history. He notes that he hasn't consumed alcohol in the past 2 years whatsoever, but prior to this alcohol was  a problem for him.   Of note prior to the patient's visit today, pt has had a CT A/P completed on 10/01/18 with results revealing Interval development of numerous ill-defined hypodense masses throughout the entire liver compatible with metastatic disease. There is a 2.5 cm enhancing mass over the upper pole cortex of the right kidney suspicious for renal cell carcinoma. 2.  No acute findings in the abdomen/pelvis. 3. 1 cm hypodensity over the pancreatic tail not well seen on previous noncontrast exams. MR may be helpful for further evaluation on an elective basis.   Most recent lab results (10/06/18) of CBC w/diff and CMP is as follows: all values are WNL except for Monocytes abs at 1.2k, Sodium at 134, Albumin at 3.0, AST at 77, ALT at 63, Alk Phos at 392, Total Bilirubin at 2.3   On review of systems, pt reports new constipation, smaller caliber stools, dark stools, unexpected weight loss, RUQ pain, new fatigue, and denies vomiting, cough, new SOB, changes in breathing, new bone pains, leg swelling, blood in the urine, changes in urination, fevers, pain along the spine, and any other symptoms.   On PMHx the pt reports Gout, heart murmur, previous alcohol abuse. He denies any surgeries.  On Social Hx the pt reports that he hasn't consumed alcohol in the past 2 years whatsoever. He notes that alcohol abuse was previously a concern. He denies ever smoking cigarettes or doing drugs. He denies any radiation or chemical exposure.  On Family  Hx the pt reports mother with pancreatic cancer in 28s, maternal aunts with high blood pressure and heart failure, maternal aunt with breast cancer.  Interval History:  [name] returns today for management, evaluation, during C1 treatment of his newly diagnosed High grade neuroendocrine carcinoma. The patient's last visit with [place] was on 11/10/18. He is accompanied today by his wife and daughter. The pt reports that he is doing well overall.   The pt reports that  he has begun eating much better in the interim, and he has gained 22 pounds in the last 13 days. The pt has been consuming 2 bottles of Ensure each day in addition to 3 meals, and did see our nutrional therapist Ernestene Kiel in the interim. The pt notes that he is now eating up to 70% of what he did prior to diagnosis. He also notes that he has been keeping more active and endorses good energy levels.   He also endorses some leg swelling bilaterally and is using compression socks. The pt denies uncontrolled abdominal pain, and is taking his pain medication less frequently. He also endorses moving his bowels well and endorses normalcy with this. He denies blood in the stools, nose bleeds or gum bleeds.  Lab results today (11/23/18) of CBC w/diff and CMP is as follows: all values are WNL except for WBC at 32.2k, RBC at 3.61, HGB at 9.9, HCT at 30.8, PLT at 77k, ANC at 22.2k, Monocytes abs at 2.2k, Abs immature granulocytes at 4.91k, Glucose at 108, Calcium at 7.5, Total Protein at 6.3, Albumin at 2.6, Alk Phos at 298. 11/23/18 Magnesium at 1.9  On review of systems, pt reports mild ankle swelling, much improved appetite, eating very well, controlled abdominal pains, desired weight gain, improved activity, good energy levels, moving his bowels well, and denies nose bleeds, gum bleeds, blood in the stools, fevers, chills, night sweats, concerns for infections, nausea, and any other symptoms.    MEDICAL HISTORY:  Past Medical History:  Diagnosis Date  . Arthritis    "knees; left hand"  . Cancer ([place])    leukemia  . Excessive drinking alcohol    Admitted to 2-3 40oz beers every other night 02/2012  . Gout   . Heart murmur    Longstanding. 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 Hypertension   . Near syncope    05/2011 with tachypalpitations & with echo showing mild LVH, normal EF 55-60%, trivial AR    SURGICAL HISTORY: Past Surgical History:  Procedure Laterality Date  . IR  IMAGING GUIDED PORT INSERTION  10/26/2018  . NO PAST SURGERIES      SOCIAL HISTORY: Social History   Socioeconomic History  . Marital status: Married    Spouse name: Not on file  . Number of children: Not on file  . Years of education: Not on file  . Highest education level: Not on file  Occupational History  . Not on file  Social Needs  . Financial resource strain: Not on file  . Food insecurity:    Worry: Not on file    Inability: Not on file  . Transportation needs:    Medical: Not on file    Non-medical: Not on file  Tobacco Use  . Smoking status: Never Smoker  . Smokeless tobacco: Never Used  Substance and Sexual Activity  . Alcohol use: Not Currently    Comment: states he has cut back/ every now and again  . Drug use: No  .  Sexual activity: Yes  Lifestyle  . Physical activity:    Days per week: Not on file    Minutes per session: Not on file  . Stress: Not on file  Relationships  . Social connections:    Talks on phone: Not on file    Gets together: Not on file    Attends religious service: Not on file    Active member of club or organization: Not on file    Attends meetings of clubs or organizations: Not on file    Relationship status: Not on file  . Intimate partner violence:    Fear of current or ex partner: Not on file    Emotionally abused: Not on file    Physically abused: Not on file    Forced sexual activity: Not on file  Other Topics Concern  . Not on file  Social History Narrative   Married. 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 Is from [place].     FAMILY HISTORY: Family History  Problem Relation Age of Onset  . Cancer Mother        Possibly pancreatic. Died at age 25    ALLERGIES:  has No Known Allergies.  MEDICATIONS:  Current Outpatient Medications  Medication Sig Dispense Refill  . colchicine 0.6 MG tablet Take 1 tablet (0.6 mg total) by mouth See admin instructions. Take 1.61m (2  tablets) followed by .635m(1 tablet) 1 hour later. 3 tablet 0  . dronabinol (MARINOL) 5 MG capsule Take 1 capsule (5 mg total) by mouth 2 (two) times daily before a meal. 60 capsule 0  . lidocaine-prilocaine (EMLA) cream Apply to affected area once 30 g 3  . LORazepam (ATIVAN) 0.5 MG tablet Take 1 tablet (0.5 mg total) by mouth every 6 (six) hours as needed (Nausea or vomiting). 30 tablet 0  . ondansetron (ZOFRAN ODT) 4 MG disintegrating tablet Take 1 tablet (4 mg total) by mouth every 8 (eight) hours as needed for nausea or vomiting. 6 tablet 0  . ondansetron (ZOFRAN) 8 MG tablet Take 1 tablet (8 mg total) by mouth 2 (two) times daily as needed for refractory nausea / vomiting. Start on day 3 after carboplatin chemo. 30 tablet 1  . oxyCODONE (OXY IR/ROXICODONE) 5 MG immediate release tablet Take 1-2 tablets (5-10 mg total) by mouth every 4 (four) hours as needed for severe pain. 90 tablet 0  . prochlorperazine (COMPAZINE) 10 MG tablet Take 1 tablet (10 mg total) by mouth every 6 (six) hours as needed (Nausea or vomiting). 30 tablet 1  . furosemide (LASIX) 20 MG tablet Take 1 tablet (20 mg total) by mouth daily. 30 tablet 1  . potassium chloride SA (K-DUR,KLOR-CON) 20 MEQ tablet Take 1 tablet (20 mEq total) by mouth daily. With lasix 30 tablet 1   No current facility-administered medications for this visit.     REVIEW OF SYSTEMS:    A 10+ POINT REVIEW OF SYSTEMS WAS OBTAINED including neurology, dermatology, psychiatry, cardiac, respiratory, lymph, extremities, GI, GU, Musculoskeletal, constitutional, breasts, reproductive, HEENT.  All pertinent positives are noted in the HPI.  All others are negative.   PHYSICAL EXAMINATION: ECOG PERFORMANCE STATUS: 1 - Symptomatic but completely ambulatory  Vitals:   11/23/18 1211  BP: 129/68  Pulse: 62  Resp: 18  Temp: 98.1 F (36.7 C)  SpO2: 100%   Filed Weights   11/23/18 1211  Weight: 170 lb 6.4 oz (77.3 kg)   .Body mass index is  26.69  kg/m.  GENERAL:alert, in no acute distress and comfortable SKIN: no acute rashes, no significant lesions EYES: conjunctiva are pink and non-injected, sclera anicteric OROPHARYNX: MMM, no exudates, no oropharyngeal erythema or ulceration NECK: supple, no JVD LYMPH:  no palpable lymphadenopathy in the cervical, axillary or inguinal regions LUNGS: some fluid at base of right lung HEART: regular rate & rhythm ABDOMEN:  normoactive bowel sounds , tender over the RUQ, not distended. Palpable massive hepatomegaly Extremity: 1-2+ pedal edema PSYCH: alert & oriented x 3 with fluent speech NEURO: no focal motor/sensory deficits   LABORATORY DATA:  I have reviewed the data as listed  . CBC Latest Ref Rng & Units 11/23/2018 11/19/2018 11/15/2018  WBC 4.0 - 10.5 K/uL 32.2(H) 37.7(H) 3.6(L)  Hemoglobin 13.0 - 17.0 g/dL 9.9(L) 10.3(L) 10.6(L)  Hematocrit 39.0 - 52.0 % 30.8(L) 31.7(L) 33.4(L)  Platelets 150 - 400 K/uL 77(L) 53(L) 58(L)    . CMP Latest Ref Rng & Units 11/23/2018 11/10/2018 11/04/2018  Glucose 70 - 99 mg/dL 108(H) 97 108(H)  BUN 6 - 20 mg/dL '8 11 14  ' Creatinine 0.61 - 1.24 mg/dL 0.69 0.63 0.96  Sodium 135 - 145 mmol/L 139 134(L) 133(L)  Potassium 3.5 - 5.1 mmol/L 3.8 4.6 4.9  Chloride 98 - 111 mmol/L 108 105 100  CO2 22 - 32 mmol/L '25 23 23  ' Calcium 8.9 - 10.3 mg/dL 7.5(L) 7.7(L) 9.5  Total Protein 6.5 - 8.1 g/dL 6.3(L) 6.4(L) 7.7  Total Bilirubin 0.3 - 1.2 mg/dL 0.6 1.1 1.6(H)  Alkaline Phos 38 - 126 U/L 298(H) 203(H) 276(H)  AST 15 - 41 U/L 17 27 41  ALT 0 - 44 U/L '11 19 30    ' 10/08/18 Liver Biopsy:    RADIOGRAPHIC STUDIES: I have personally reviewed the radiological images as listed and agreed with the findings in the report. Dg Chest 2 View  Result Date: 10/31/2018 CLINICAL DATA:  Upper abdominal pain. Shortness of breath. Metastatic small cell carcinoma to the liver and bones. EXAM: CHEST - 2 VIEW COMPARISON:  Chest x-ray dated 10/01/2018 and PET-CT dated 10/15/2018  FINDINGS: New power port in place. The tip is at the cavoatrial junction. Heart size and vascularity are normal. Lungs are clear. No discrete bone abnormality. No effusions. IMPRESSION: No active cardiopulmonary disease. Electronically Signed   By: Lorriane Shire M.D.   On: 10/31/2018 16:32   Ct Angio Chest Pe W/cm &/or Wo Cm  Result Date: 10/31/2018 CLINICAL DATA:  Abdominal pain and dyspnea starting this morning. EXAM: CT ANGIOGRAPHY CHEST CT ABDOMEN AND PELVIS WITH CONTRAST TECHNIQUE: Multidetector CT imaging of the chest was performed using the standard protocol during bolus administration of intravenous contrast. Multiplanar CT image reconstructions and MIPs were obtained to evaluate the vascular anatomy. Multidetector CT imaging of the abdomen and pelvis was performed using the standard protocol during bolus administration of intravenous contrast. CONTRAST:  13m ISOVUE-370 IOPAMIDOL (ISOVUE-370) INJECTION 76% COMPARISON:  PET CT 10/15/2018, CT abdomen and pelvis 10/01/2018 FINDINGS: CTA CHEST FINDINGS Cardiovascular: Satisfactory opacification of the pulmonary arteries to the segmental level. No evidence of pulmonary embolism. Normal heart size. No pericardial effusion. Mediastinum/Nodes: Common arterial branch point of the right brachiocephalic and left common carotid arteries. No aneurysm or dissection. Nonaneurysmal thoracic aorta. No dissection is noted. No mediastinal or hilar lymphadenopathy. The CT appearance of the esophagus is unremarkable. Midline patent trachea. Patent mainstem bronchi. Lungs/Pleura: No dominant mass or pulmonary consolidation. Redemonstration of tiny 3 mm or less right upper lobe pulmonary nodules as  seen on PET-CT. No significant progression. No effusion. Minimal right basilar atelectasis. No pneumothorax. Musculoskeletal: No aggressive osseous lesions. No acute fracture. Port catheter is noted overlying the anterior right chest wall leads projecting into right atrium.  Review of the MIP images confirms the above findings. CT ABDOMEN and PELVIS FINDINGS Hepatobiliary: Redemonstration of too numerous to count hypodense liver masses consistent with metastasis. Gallbladder is unremarkable and free of stones. There is a stable cyst in the right hepatic lobe measuring 1.8 cm. Pancreas: 12 mm hypodense lesion in the pancreatic tail is only minimally enlarged from 10 mm previously. No ductal dilatation or inflammation. Spleen: No splenomegaly. Scattered calcified granulomata are noted. Adrenals/Urinary Tract: Normal bilateral adrenal glands. Indeterminate mass arising off the upper pole the right kidney measuring up to 1.7 cm in diameter is without significant progression. No additional lesions are noted. No nephrolithiasis nor hydroureteronephrosis. Stomach/Bowel: The stomach is decompressed in appearance. No bowel obstruction or inflammation. Moderate stool retention is seen within the colon. No annular constricting lesions are identified.Normal caliber appendix with appendicular with measuring 6 mm is identified. Vascular/Lymphatic: Atherosclerosis of the abdominal aorta without aneurysm. Patent branch vessels. The splenic and portal veins are patent. Reproductive: Normal size prostate. Other: Trace free fluid in the pelvis. Mild soft tissue anasarca. Musculoskeletal: No aggressive osteo lytic or blastic lesion. 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 Small sclerotic focus in the left iliac bone is indeterminate stable and more likely represents a bone island. Review of the MIP images confirms the above findings. IMPRESSION: 1. No acute pulmonary embolus, aortic aneurysm or dissection. 2. Stable tiny 3 mm or less right upper lobe pulmonary nodules. 3. Redemonstration of too numerous to count hypodense liver masses consistent with metastasis. 4. Indeterminate mass arising off the upper pole the right kidney measuring up to 1.7 cm in diameter without significant  progression. 5. Indeterminate 12 mm hypodense lesion in the pancreatic tail only minimally enlarged from 10 mm previously. 6. Atherosclerosis of the abdominal aorta without aneurysm or dissection. Electronically Signed   By: Ashley Royalty M.D.   On: 10/31/2018 18:16   Mr Jeri Cos XY Contrast  Result Date: 10/26/2018 CLINICAL DATA:  Metastatic small-cell carcinoma unknown primary. Staging. Bony metastatic disease. EXAM: MRI HEAD WITHOUT AND WITH CONTRAST TECHNIQUE: Multiplanar, multiecho pulse sequences of the brain and surrounding structures were obtained without and with intravenous contrast. CONTRAST:  7 mL Gadovist IV COMPARISON:  None. FINDINGS: Brain: No acute infarction, hemorrhage, hydrocephalus, extra-axial collection or mass lesion. Normal enhancement. No enhancing mass lesion. Leptomeningeal enhancement is normal. Vascular: Normal arterial flow voids Skull and upper cervical spine: No focal bone marrow lesion. Sinuses/Orbits: Negative Other: None IMPRESSION: No acute abnormality and negative for metastatic disease. Electronically Signed   By: Franchot Gallo M.D.   On: 10/26/2018 13:36   Ct Abdomen Pelvis W Contrast  Result Date: 10/31/2018 CLINICAL DATA:  Abdominal pain and dyspnea starting this morning. EXAM: CT ANGIOGRAPHY CHEST CT ABDOMEN AND PELVIS WITH CONTRAST TECHNIQUE: Multidetector CT imaging of the chest was performed using the standard protocol during bolus administration of intravenous contrast. Multiplanar CT image reconstructions and MIPs were obtained to evaluate the vascular anatomy. Multidetector CT imaging of the abdomen and pelvis was performed using the standard protocol during bolus administration of intravenous contrast. CONTRAST:  171m ISOVUE-370 IOPAMIDOL (ISOVUE-370) INJECTION 76% COMPARISON:  PET CT 10/15/2018, CT abdomen and pelvis 10/01/2018 FINDINGS: CTA CHEST FINDINGS Cardiovascular: Satisfactory opacification of the pulmonary arteries to the segmental level. No  evidence of pulmonary embolism. Normal heart size. No pericardial effusion. Mediastinum/Nodes: Common arterial branch point of the right brachiocephalic and left common carotid arteries. No aneurysm or dissection. Nonaneurysmal thoracic aorta. No dissection is noted. No mediastinal or hilar lymphadenopathy. The CT appearance of the esophagus is unremarkable. Midline patent trachea. Patent mainstem bronchi. Lungs/Pleura: No dominant mass or pulmonary consolidation. Redemonstration of tiny 3 mm or less right upper lobe pulmonary nodules as seen on PET-CT. No significant progression. No effusion. Minimal right basilar atelectasis. No pneumothorax. Musculoskeletal: No aggressive osseous lesions. No acute fracture. Port catheter is noted overlying the anterior right chest wall leads projecting into right atrium. Review of the MIP images confirms the above findings. CT ABDOMEN and PELVIS FINDINGS Hepatobiliary: Redemonstration of too numerous to count hypodense liver masses consistent with metastasis. Gallbladder is unremarkable and free of stones. There is a stable cyst in the right hepatic lobe measuring 1.8 cm. Pancreas: 12 mm hypodense lesion in the pancreatic tail is only minimally enlarged from 10 mm previously. No ductal dilatation or inflammation. Spleen: No splenomegaly. Scattered calcified granulomata are noted. Adrenals/Urinary Tract: Normal bilateral adrenal glands. Indeterminate mass arising off the upper pole the right kidney measuring up to 1.7 cm in diameter is without significant progression. No additional lesions are noted. No nephrolithiasis nor hydroureteronephrosis. Stomach/Bowel: The stomach is decompressed in appearance. No bowel obstruction or inflammation. Moderate stool retention is seen within the colon. No annular constricting lesions are identified.Normal caliber appendix with appendicular with measuring 6 mm is identified. Vascular/Lymphatic: Atherosclerosis of the abdominal aorta without  aneurysm. Patent branch vessels. The splenic and portal veins are patent. Reproductive: Normal size prostate. Other: Trace free fluid in the pelvis. Mild soft tissue anasarca. Musculoskeletal: No aggressive osteo lytic or blastic lesion. 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 Small sclerotic focus in the left iliac bone is indeterminate stable and more likely represents a bone island. Review of the MIP images confirms the above findings. IMPRESSION: 1. No acute pulmonary embolus, aortic aneurysm or dissection. 2. Stable tiny 3 mm or less right upper lobe pulmonary nodules. 3. Redemonstration of too numerous to count hypodense liver masses consistent with metastasis. 4. Indeterminate mass arising off the upper pole the right kidney measuring up to 1.7 cm in diameter without significant progression. 5. Indeterminate 12 mm hypodense lesion in the pancreatic tail only minimally enlarged from 10 mm previously. 6. Atherosclerosis of the abdominal aorta without aneurysm or dissection. Electronically Signed   By: Ashley Royalty M.D.   On: 10/31/2018 18:16   Ir Imaging Guided Port Insertion  Result Date: 10/26/2018 INDICATION: History of metastatic neuroendocrine cancer. In need of durable intravenous access for chemotherapy administration. EXAM: IMPLANTED PORT A CATH PLACEMENT WITH ULTRASOUND AND FLUOROSCOPIC GUIDANCE COMPARISON:  PET-CT-10/15/2018 MEDICATIONS: Ancef 2 gm IV; The antibiotic was administered within an appropriate time interval prior to skin puncture. ANESTHESIA/SEDATION: Moderate (conscious) sedation was employed during this procedure. A total of Versed 3 mg and Fentanyl 100 mcg was administered intravenously. Moderate Sedation Time: 27 minutes. The patient's level of consciousness and vital signs were monitored continuously by radiology nursing throughout the procedure under my direct supervision. CONTRAST:  None FLUOROSCOPY TIME:  16 seconds (4.7 mGy) COMPLICATIONS: None immediate.  PROCEDURE: The procedure, risks, benefits, and alternatives were explained to the patient. Questions regarding the procedure were encouraged and answered. The patient understands and consents to the procedure. The right neck and chest were prepped with chlorhexidine in a sterile fashion, and a sterile drape was  applied covering the operative field. Maximum barrier sterile technique with sterile gowns and gloves were used for the procedure. A timeout was performed prior to the initiation of the procedure. Local anesthesia was provided with 1% lidocaine with epinephrine. After creating a small venotomy incision, a micropuncture kit was utilized to access the internal jugular vein. Real-time ultrasound guidance was utilized for vascular access including the acquisition of a permanent ultrasound image documenting patency of the accessed vessel. The microwire was utilized to measure appropriate catheter length. A subcutaneous port pocket was then created along the upper chest wall utilizing a combination of sharp and blunt dissection. The pocket was irrigated with sterile saline. A single lumen thin power injectable port was chosen for placement. The 8 Fr catheter was tunneled from the port pocket site to the venotomy incision. The port was placed in the pocket. The external catheter was trimmed to appropriate length. At the venotomy, an 8 Fr peel-away sheath was placed over a guidewire under fluoroscopic guidance. The catheter was then placed through the sheath and the sheath was removed. Final catheter positioning was confirmed and documented with a fluoroscopic spot radiograph. The port was accessed with a Huber needle, aspirated and flushed with heparinized saline. The venotomy site was closed with an interrupted 4-0 Vicryl suture. The port pocket incision was closed with interrupted 2-0 Vicryl suture and the skin was opposed with a running subcuticular 4-0 Vicryl suture. Dermabond and Steri-strips were applied to  both incisions. Dressings were placed. The patient tolerated the procedure well without immediate post procedural complication. FINDINGS: After catheter placement, the tip lies within the superior cavoatrial junction. The catheter aspirates and flushes normally and is ready for immediate use. IMPRESSION: Successful placement of a right internal jugular approach power injectable Port-A-Cath. The catheter is ready for immediate use. Electronically Signed   By: Sandi Mariscal M.D.   On: 10/26/2018 10:26    ASSESSMENT & PLAN:  58 y.o. male with  1. 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 There is a 2.5 cm enhancing mass over the upper pole cortex of the right kidney suspicious for renal cell carcinoma. 2.  No acute findings in the abdomen/pelvis. 3. 1 cm hypodensity over the pancreatic tail not well seen on previous noncontrast exams. MR may be helpful for further evaluation on an elective basis.    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 2. Upper pole right renal lesion is not definitely hypermetabolic and has been present since 2012. I think is unlikely the cause of the metastatic disease. Recommend liver biopsy for tissue diagnosis. If needed, an MRI of the abdomen without and with contrast may be helpful for further evaluation of the renal lesion. 3. Two sub 3 mm right upper lobe pulmonary nodules are indeterminate.   10/26/18 MRI Brain revealed No acute abnormality and negative for metastatic disease.  2. Abnormal LFTs - likely  from liver metastases  Labs upon initial presentation from 10/06/18, some borderline monocytosis at 1.2k, otherwise normal blood counts, AST at 77, ALT at 63, Alk Phos at 392, Total Bilirubin at 2.3   3.  Rt renal mass - concerning for RCC  PLAN:  -Discussed pt labwork today, 11/23/18; transaminases remain normal, Alk Phos at 298. ANC at 22.2k in setting of G-CSF support, HGB at 9.9, PLT at 77k -Regarding the patient's thrombocytopenia, will decrease doses of Carboplatin by 20% and Etoposide by 20% , and will watch labs closely  -The pt has no prohibitive toxicities from continuing C2 Carboplatin and Etoposide (with dose reductions) with G-CSF support at this time.   -Will begin Lasix for some fluid at base of right lung, and ankle swelling and decrease sodium rich food options . Potassium supplementation with lasix ordered. -Advised keeping legs elevated  -Continue high protein and high calorie meal supplements such as Boost or Ensure -Did order Marinol as well for appetite stimulation  -Continue using compression socks and keep feet elevated -Plan for Carboplatin and Etoposide every 3 weeks, with G-CSF support for up to 6 cycles -Strongly recommended that the pt not lift, push, or pull anything to prevent the risk of pathologic fractures as the PET/CT does indicate bone involvement -Continue Xgeva every 4 weeks -Oxycodone for pain mx, up to every 6 hours -Recommended that the pt continue to eat well, drink at least 48-64 oz of water each day, and walk 20-30 minutes each day.  -Will see the pt back in 2 weeks    Proceed with treatment as scheduled for C2 from tomorrow Labs in 8 days RTC with Dr Irene Limbo with labs in 2 weeks Please schedule C3 of treatment per orders with labs and MD visit Continue Marchelle Folks  All of the patients questions were answered with apparent satisfaction. The patient knows to call the clinic with any problems, questions or concerns.  The total time spent in the  appt was 30 minutes and more than 50% was on counseling and direct patient cares.    Sullivan Lone MD MS AAHIVMS [facility] Hematology/Oncology Physician [facility]  (Office):       [phone] (Work cell):  [phone] (Fax):           [phone]  11/23/2018 12:43 PM  I, Baldwin Jamaica, am acting as a scribe for Dr. Sullivan Lone.   .I have reviewed the above documentation for accuracy and completeness, and I agree with the above. Brunetta Genera MD

## 2018-11-23 ENCOUNTER — Telehealth: Payer: Self-pay | Admitting: Hematology

## 2018-11-23 ENCOUNTER — Inpatient Hospital Stay: Payer: Medicaid Other

## 2018-11-23 ENCOUNTER — Inpatient Hospital Stay (HOSPITAL_BASED_OUTPATIENT_CLINIC_OR_DEPARTMENT_OTHER): Payer: Medicaid Other | Admitting: Hematology

## 2018-11-23 ENCOUNTER — Ambulatory Visit: Payer: Self-pay

## 2018-11-23 VITALS — BP 129/68 | HR 62 | Temp 98.1°F | Resp 18 | Ht 67.0 in | Wt 170.4 lb

## 2018-11-23 DIAGNOSIS — C801 Malignant (primary) neoplasm, unspecified: Principal | ICD-10-CM

## 2018-11-23 DIAGNOSIS — C7A1 Malignant poorly differentiated neuroendocrine tumors: Secondary | ICD-10-CM

## 2018-11-23 DIAGNOSIS — C7951 Secondary malignant neoplasm of bone: Secondary | ICD-10-CM

## 2018-11-23 DIAGNOSIS — Z7189 Other specified counseling: Secondary | ICD-10-CM

## 2018-11-23 DIAGNOSIS — Z95828 Presence of other vascular implants and grafts: Secondary | ICD-10-CM

## 2018-11-23 DIAGNOSIS — C7B02 Secondary carcinoid tumors of liver: Secondary | ICD-10-CM

## 2018-11-23 DIAGNOSIS — R945 Abnormal results of liver function studies: Secondary | ICD-10-CM

## 2018-11-23 DIAGNOSIS — E43 Unspecified severe protein-calorie malnutrition: Secondary | ICD-10-CM

## 2018-11-23 DIAGNOSIS — C787 Secondary malignant neoplasm of liver and intrahepatic bile duct: Secondary | ICD-10-CM

## 2018-11-23 DIAGNOSIS — N2889 Other specified disorders of kidney and ureter: Secondary | ICD-10-CM

## 2018-11-23 DIAGNOSIS — Z5111 Encounter for antineoplastic chemotherapy: Secondary | ICD-10-CM | POA: Diagnosis not present

## 2018-11-23 LAB — CBC WITH DIFFERENTIAL/PLATELET
Abs Immature Granulocytes: 4.91 10*3/uL — ABNORMAL HIGH (ref 0.00–0.07)
BASOS ABS: 0 10*3/uL (ref 0.0–0.1)
Basophils Relative: 0 %
Eosinophils Absolute: 0.1 10*3/uL (ref 0.0–0.5)
Eosinophils Relative: 0 %
HEMATOCRIT: 30.8 % — AB (ref 39.0–52.0)
HEMOGLOBIN: 9.9 g/dL — AB (ref 13.0–17.0)
IMMATURE GRANULOCYTES: 15 %
LYMPHS ABS: 2.8 10*3/uL (ref 0.7–4.0)
Lymphocytes Relative: 9 %
MCH: 27.4 pg (ref 26.0–34.0)
MCHC: 32.1 g/dL (ref 30.0–36.0)
MCV: 85.3 fL (ref 80.0–100.0)
Monocytes Absolute: 2.2 10*3/uL — ABNORMAL HIGH (ref 0.1–1.0)
Monocytes Relative: 7 %
Neutro Abs: 22.2 10*3/uL — ABNORMAL HIGH (ref 1.7–7.7)
Neutrophils Relative %: 69 %
Platelets: 77 10*3/uL — ABNORMAL LOW (ref 150–400)
RBC: 3.61 MIL/uL — ABNORMAL LOW (ref 4.22–5.81)
RDW: 13.1 % (ref 11.5–15.5)
WBC: 32.2 10*3/uL — ABNORMAL HIGH (ref 4.0–10.5)
nRBC: 0.2 % (ref 0.0–0.2)

## 2018-11-23 LAB — CMP (CANCER CENTER ONLY)
ALK PHOS: 298 U/L — AB (ref 38–126)
ALT: 11 U/L (ref 0–44)
ANION GAP: 6 (ref 5–15)
AST: 17 U/L (ref 15–41)
Albumin: 2.6 g/dL — ABNORMAL LOW (ref 3.5–5.0)
BUN: 8 mg/dL (ref 6–20)
CO2: 25 mmol/L (ref 22–32)
Calcium: 7.5 mg/dL — ABNORMAL LOW (ref 8.9–10.3)
Chloride: 108 mmol/L (ref 98–111)
Creatinine: 0.69 mg/dL (ref 0.61–1.24)
GFR, Est AFR Am: >60 mL/min (ref >60)
GFR, Estimated: >60 mL/min (ref >60)
Glucose, Bld: 108 mg/dL — ABNORMAL HIGH (ref 70–99)
Potassium: 3.8 mmol/L (ref 3.5–5.1)
Sodium: 139 mmol/L (ref 135–145)
Total Bilirubin: 0.6 mg/dL (ref 0.3–1.2)
Total Protein: 6.3 g/dL — ABNORMAL LOW (ref 6.5–8.1)

## 2018-11-23 LAB — MAGNESIUM: Magnesium: 1.9 mg/dL (ref 1.7–2.4)

## 2018-11-23 MED ORDER — PALONOSETRON HCL INJECTION 0.25 MG/5ML
INTRAVENOUS | Status: AC
  Filled 2018-11-23: qty 5

## 2018-11-23 MED ORDER — HEPARIN SOD (PORK) LOCK FLUSH 100 UNIT/ML IV SOLN
500.0000 [IU] | Freq: Once | INTRAVENOUS | Status: AC | PRN
  Administered 2018-11-23: 500 [IU]
  Filled 2018-11-23: qty 5

## 2018-11-23 MED ORDER — SODIUM CHLORIDE 0.9% FLUSH
10.0000 mL | INTRAVENOUS | Status: DC | PRN
  Administered 2018-11-23: 10 mL
  Filled 2018-11-23: qty 10

## 2018-11-23 NOTE — Patient Instructions (Signed)
Thank you for choosing [facility] to provide your oncology and hematology care.  To afford each patient quality time with our providers, please arrive 30 minutes before your scheduled appointment time.  If you arrive late for your appointment, you may be asked to reschedule.  We strive to give you quality time with our providers, and arriving late affects you and other patients whose appointments are after yours.    If you are a no show for multiple scheduled visits, you may be dismissed from the clinic at the providers discretion.     Again, thank you for choosing [facility], our hope is that these requests will decrease the amount of time that you wait before being seen by our physicians.  ______________________________________________________________________   Should you have questions after your visit to the [facility], please contact our office at [phone] between the hours of 8:30 and 4:30 p.m.    Voicemails left after 4:30p.m will not be returned until the following business day.     For prescription refill requests, please have your pharmacy contact us directly.  Please also try to allow 48 hours for prescription requests.     Please contact the scheduling department for questions regarding scheduling.  For scheduling of procedures such as PET scans, CT scans, MRI, Ultrasound, etc please contact central scheduling at [phone].     Resources For Cancer Patients and Caregivers:    Oncolink.org:  A wonderful resource for patients and healthcare providers for information regarding your disease, ways to tract your treatment, what to expect, etc.      American [employer]:  800-227-2345  Can help patients locate various types of support and financial assistance   Cancer Care: 1-800-813-HOPE (4673) Provides financial assistance, online support groups, medication/co-pay assistance.     [place] DSS:  336-641-3447 Where to apply  for food stamps, Medicaid, and utility assistance   Medicare Rights Center: 800-333-4114 Helps people with Medicare understand their rights and benefits, navigate the Medicare system, and secure the quality healthcare they deserve   SCAT: 336-333-6589 [employer]'s shared-ride transportation service for eligible riders who have a disability that prevents them from riding the fixed route bus.     For additional information on assistance programs please contact our social worker:   [name]:  [phone]  

## 2018-11-23 NOTE — Telephone Encounter (Signed)
Scheduled appts per 01/21 los.  Printed calendar and avs per patient request.

## 2018-11-24 ENCOUNTER — Inpatient Hospital Stay (HOSPITAL_BASED_OUTPATIENT_CLINIC_OR_DEPARTMENT_OTHER): Payer: Medicaid Other | Admitting: Medical

## 2018-11-24 ENCOUNTER — Inpatient Hospital Stay: Payer: Medicaid Other

## 2018-11-24 VITALS — BP 169/73 | HR 54 | Temp 98.5°F | Resp 14

## 2018-11-24 DIAGNOSIS — C7A1 Malignant poorly differentiated neuroendocrine tumors: Secondary | ICD-10-CM

## 2018-11-24 DIAGNOSIS — C7B02 Secondary carcinoid tumors of liver: Secondary | ICD-10-CM

## 2018-11-24 DIAGNOSIS — C787 Secondary malignant neoplasm of liver and intrahepatic bile duct: Secondary | ICD-10-CM

## 2018-11-24 DIAGNOSIS — T8090XA Unspecified complication following infusion and therapeutic injection, initial encounter: Secondary | ICD-10-CM

## 2018-11-24 DIAGNOSIS — Z5111 Encounter for antineoplastic chemotherapy: Secondary | ICD-10-CM | POA: Diagnosis not present

## 2018-11-24 DIAGNOSIS — Z7189 Other specified counseling: Secondary | ICD-10-CM

## 2018-11-24 DIAGNOSIS — C801 Malignant (primary) neoplasm, unspecified: Principal | ICD-10-CM

## 2018-11-24 MED ORDER — SODIUM CHLORIDE 0.9 % IV SOLN
80.0000 mg/m2 | Freq: Once | INTRAVENOUS | Status: AC
  Administered 2018-11-24: 150 mg via INTRAVENOUS
  Filled 2018-11-24: qty 7.5

## 2018-11-24 MED ORDER — OXYCODONE HCL 5 MG PO TABS: 5.0000 mg | ORAL_TABLET | ORAL | 0 refills | Status: DC | PRN

## 2018-11-24 MED ORDER — FAMOTIDINE IN NACL 20-0.9 MG/50ML-% IV SOLN
20.0000 mg | Freq: Once | INTRAVENOUS | Status: AC
  Administered 2018-11-24: 20 mg via INTRAVENOUS

## 2018-11-24 MED ORDER — PALONOSETRON HCL INJECTION 0.25 MG/5ML
0.2500 mg | Freq: Once | INTRAVENOUS | Status: AC
  Administered 2018-11-24: 0.25 mg via INTRAVENOUS

## 2018-11-24 MED ORDER — SODIUM CHLORIDE 0.9 % IV SOLN
Freq: Once | INTRAVENOUS | Status: AC
  Administered 2018-11-24: 09:00:00 via INTRAVENOUS
  Filled 2018-11-24: qty 250

## 2018-11-24 MED ORDER — HEPARIN SOD (PORK) LOCK FLUSH 100 UNIT/ML IV SOLN
500.0000 [IU] | Freq: Once | INTRAVENOUS | Status: AC | PRN
  Administered 2018-11-24: 500 [IU]
  Filled 2018-11-24: qty 5

## 2018-11-24 MED ORDER — POTASSIUM CHLORIDE CRYS ER 20 MEQ PO TBCR: 20.0000 meq | EXTENDED_RELEASE_TABLET | Freq: Every day | ORAL | 1 refills | Status: DC

## 2018-11-24 MED ORDER — SODIUM CHLORIDE 0.9 % IV SOLN
Freq: Once | INTRAVENOUS | Status: AC
  Administered 2018-11-24: 09:00:00 via INTRAVENOUS
  Filled 2018-11-24: qty 5

## 2018-11-24 MED ORDER — DIPHENHYDRAMINE HCL 50 MG/ML IJ SOLN
25.0000 mg | Freq: Once | INTRAMUSCULAR | Status: AC
  Administered 2018-11-24: 25 mg via INTRAVENOUS

## 2018-11-24 MED ORDER — SODIUM CHLORIDE 0.9 % IV SOLN
500.0000 mg | Freq: Once | INTRAVENOUS | Status: AC
  Administered 2018-11-24: 500 mg via INTRAVENOUS
  Filled 2018-11-24: qty 50

## 2018-11-24 MED ORDER — SODIUM CHLORIDE 0.9% FLUSH
10.0000 mL | INTRAVENOUS | Status: DC | PRN
  Administered 2018-11-24: 10 mL
  Filled 2018-11-24: qty 10

## 2018-11-24 MED ORDER — PALONOSETRON HCL INJECTION 0.25 MG/5ML
INTRAVENOUS | Status: AC
  Filled 2018-11-24: qty 5

## 2018-11-24 MED ORDER — FUROSEMIDE 20 MG PO TABS: 20.0000 mg | ORAL_TABLET | Freq: Every day | ORAL | 1 refills | Status: DC

## 2018-11-24 MED ORDER — FAMOTIDINE IN NACL 20-0.9 MG/50ML-% IV SOLN
INTRAVENOUS | Status: AC
  Filled 2018-11-24: qty 50

## 2018-11-24 NOTE — Progress Notes (Signed)
    DATE:  11/24/2018                                          X  CHEMO/IMMUNOTHERAPY REACTION             MD:  Dr. Sullivan Lone   AGENT/BLOOD [place]:              carboplatin and etoposide    AGENT/BLOOD PRODUCT RECEIVING IMMEDIATELY PRIOR TO REACTION:          carboplatin   VS: BP:     169/73   P:       60       SPO2:       100                  REACTION(S):           Shortness of breath   PREMEDS:     Aloxi and Emend    INTERVENTION: Pepcid 20 mg IV initially. Symptoms abated. Symptoms recurred with restart of carboplatin. Patient was then given Benadryl 25 mg IV.   Review of Systems  Review of Systems  Constitutional: Negative for chills, diaphoresis and fever.  HENT: Negative for trouble swallowing and voice change.   Respiratory: Positive for shortness of breath. Negative for cough, chest tightness and wheezing.   Cardiovascular: Negative for chest pain and palpitations.  Gastrointestinal: Negative for abdominal pain, constipation, diarrhea, nausea and vomiting.  Musculoskeletal: Negative for back pain and myalgias.  Neurological: Negative for dizziness, light-headedness and headaches.     Physical Exam  Physical Exam Constitutional:      General: He is not in acute distress.    Appearance: He is not diaphoretic.  HENT:     Head: Normocephalic and atraumatic.  Cardiovascular:     Rate and Rhythm: Normal rate and regular rhythm.     Heart sounds: Normal heart sounds. No murmur. No friction rub. No gallop.   Pulmonary:     Effort: Pulmonary effort is normal. No respiratory distress.     Breath sounds: Normal breath sounds. No wheezing or rales.  Skin:    General: Skin is warm and dry.     Findings: No erythema or rash.  Neurological:     Mental Status: He is alert.     OUTCOME:                The patient's shortness of breath abated. Carboplatin was restarted with the patient completing chemotherapy without further issues of concern.   Sandi Mealy, MHS, PA-C

## 2018-11-24 NOTE — Progress Notes (Signed)
Carbo (2nd dose)today. Pt C/O SOB 10-74min into infusion. Tx paused. Lucianne Lei notified, at bedside. Pepcid 20mg  IV ordered. HR 62, O2 100%. Continuing to monitor. NO chest pain reported.

## 2018-11-24 NOTE — Patient Instructions (Signed)
[facility] Discharge Instructions for Patients Receiving Chemotherapy  Today you received the following chemotherapy agents :  Carboplatin, Etoposide,  Denosumab.  To help prevent nausea and vomiting after your treatment, we encourage you to take your nausea medication as prescribed.  TAKE  ZOFRAN 8 MG TWICE DAILY FOR NAUSEA  FOR 2 DAYS AFTER CHEMO - START ON THIRD DAY AFTER CHEMO.  TAKE COMPAZINE 10 MG EVERY 6 HOURS AS NEEDED FOR NAUSEA.  DO DRINK LOTS OF FLUIDS AS TOLERATED.  DO NOT EAT GREASY NOR SPICY FOODS.   TAKE CALCIUM + VIT D  1000 MG TO 1200 MG DAILY OR AS INSTRUCTED BY DR. Irene Limbo.   If you develop nausea and vomiting that is not controlled by your nausea medication, call the clinic.   BELOW ARE SYMPTOMS THAT SHOULD BE REPORTED IMMEDIATELY:  *FEVER GREATER THAN 100.5 F  *CHILLS WITH OR WITHOUT FEVER  NAUSEA AND VOMITING THAT IS NOT CONTROLLED WITH YOUR NAUSEA MEDICATION  *UNUSUAL SHORTNESS OF BREATH  *UNUSUAL BRUISING OR BLEEDING  TENDERNESS IN MOUTH AND THROAT WITH OR WITHOUT PRESENCE OF ULCERS  *URINARY PROBLEMS  *BOWEL PROBLEMS  UNUSUAL RASH Items with * indicate a potential emergency and should be followed up as soon as possible.  Feel free to call the clinic should you have any questions or concerns. The clinic phone number is [phone].  Please show the [place] at check-in to the Emergency Department and triage nurse.   Carboplatin injection What is this medicine? CARBOPLATIN (KAR boe pla tin) is a chemotherapy drug. It targets fast dividing cells, like cancer cells, and causes these cells to die. This medicine is used to treat ovarian cancer and many other cancers. This medicine may be used for other purposes; ask your health care provider or pharmacist if you have questions. COMMON BRAND NAME(S): Paraplatin What should I tell my health care provider before I take this medicine? They need to know if you have any of these  conditions: -blood disorders -hearing problems -kidney disease -recent or ongoing radiation therapy -an unusual or allergic reaction to carboplatin, cisplatin, other chemotherapy, other medicines, foods, dyes, or preservatives -pregnant or trying to get pregnant -breast-feeding How should I use this medicine? This drug is usually given as an infusion into a vein. It is administered in a hospital or clinic by a specially trained health care professional. Talk to your pediatrician regarding the use of this medicine in children. Special care may be needed. Overdosage: If you think you have taken too much of this medicine contact a poison control center or emergency room at once. NOTE: This medicine is only for you. Do not share this medicine with others. What if I miss a dose? It is important not to miss a dose. Call your doctor or health care professional if you are unable to keep an appointment. What may interact with this medicine? -medicines for seizures -medicines to increase blood counts like filgrastim, pegfilgrastim, sargramostim -some antibiotics like amikacin, gentamicin, neomycin, streptomycin, tobramycin -vaccines Talk to your doctor or health care professional before taking any of these medicines: -acetaminophen -aspirin -ibuprofen -ketoprofen -naproxen This list may not describe all possible interactions. Give your health care provider a list of all the medicines, herbs, non-prescription drugs, or dietary supplements you use. Also tell them if you smoke, drink alcohol, or use illegal drugs. Some items may interact with your medicine. What should I watch for while using this medicine? Your condition will be monitored carefully while you  are receiving this medicine. You will need important blood work done while you are taking this medicine. This drug may make you feel generally unwell. This is not uncommon, as chemotherapy can affect healthy cells as well as cancer cells. Report  any side effects. Continue your course of treatment even though you feel ill unless your doctor tells you to stop. In some cases, you may be given additional medicines to help with side effects. Follow all directions for their use. Call your doctor or health care professional for advice if you get a fever, chills or sore throat, or other symptoms of a cold or flu. Do not treat yourself. This drug decreases your body's ability to fight infections. Try to avoid being around people who are sick. This medicine may increase your risk to bruise or bleed. Call your doctor or health care professional if you notice any unusual bleeding. Be careful brushing and flossing your teeth or using a toothpick because you may get an infection or bleed more easily. If you have any dental work done, tell your dentist you are receiving this medicine. Avoid taking products that contain aspirin, acetaminophen, ibuprofen, naproxen, or ketoprofen unless instructed by your doctor. These medicines may hide a fever. Do not become pregnant while taking this medicine. Women should inform their doctor if they wish to become pregnant or think they might be pregnant. There is a potential for serious side effects to an unborn child. Talk to your health care professional or pharmacist for more information. Do not breast-feed an infant while taking this medicine. What side effects may I notice from receiving this medicine? Side effects that you should report to your doctor or health care professional as soon as possible: -allergic reactions like skin rash, itching or hives, swelling of the face, lips, or tongue -signs of infection - fever or chills, cough, sore throat, pain or difficulty passing urine -signs of decreased platelets or bleeding - bruising, pinpoint red spots on the skin, black, tarry stools, nosebleeds -signs of decreased red blood cells - unusually weak or tired, fainting spells, lightheadedness -breathing  problems -changes in hearing -changes in vision -chest pain -high blood pressure -low blood counts - This drug may decrease the number of white blood cells, red blood cells and platelets. You may be at increased risk for infections and bleeding. -nausea and vomiting -pain, swelling, redness or irritation at the injection site -pain, tingling, numbness in the hands or feet -problems with balance, talking, walking -trouble passing urine or change in the amount of urine Side effects that usually do not require medical attention (report to your doctor or health care professional if they continue or are bothersome): -hair loss -loss of appetite -metallic taste in the mouth or changes in taste This list may not describe all possible side effects. Call your doctor for medical advice about side effects. You may report side effects to FDA at 1-800-FDA-1088. Where should I keep my medicine? This drug is given in a hospital or clinic and will not be stored at home. NOTE: This sheet is a summary. It may not cover all possible information. If you have questions about this medicine, talk to your doctor, pharmacist, or health care provider.  2019 Elsevier/Gold Standard (2008-01-25 14:38:05)   Etoposide, VP-16 injection What is this medicine? ETOPOSIDE, VP-16 (e toe POE side) is a chemotherapy drug. It is used to treat testicular cancer, lung cancer, and other cancers. This medicine may be used for other purposes; ask your health care provider  or pharmacist if you have questions. COMMON BRAND NAME(S): Etopophos, Toposar, VePesid What should I tell my health care provider before I take this medicine? They need to know if you have any of these conditions: -infection -kidney disease -liver disease -low blood counts, like low white cell, platelet, or red cell counts -an unusual or allergic reaction to etoposide, other medicines, foods, dyes, or preservatives -pregnant or trying to get  pregnant -breast-feeding How should I use this medicine? This medicine is for infusion into a vein. It is administered in a hospital or clinic by a specially trained health care professional. Talk to your pediatrician regarding the use of this medicine in children. Special care may be needed. Overdosage: If you think you have taken too much of this medicine contact a poison control center or emergency room at once. NOTE: This medicine is only for you. Do not share this medicine with others. What if I miss a dose? It is important not to miss your dose. Call your doctor or health care professional if you are unable to keep an appointment. What may interact with this medicine? -aspirin -certain medications for seizures like carbamazepine, phenobarbital, phenytoin, valproic acid -cyclosporine -levamisole -warfarin This list may not describe all possible interactions. Give your health care provider a list of all the medicines, herbs, non-prescription drugs, or dietary supplements you use. Also tell them if you smoke, drink alcohol, or use illegal drugs. Some items may interact with your medicine. What should I watch for while using this medicine? Visit your doctor for checks on your progress. This drug may make you feel generally unwell. This is not uncommon, as chemotherapy can affect healthy cells as well as cancer cells. Report any side effects. Continue your course of treatment even though you feel ill unless your doctor tells you to stop. In some cases, you may be given additional medicines to help with side effects. Follow all directions for their use. Call your doctor or health care professional for advice if you get a fever, chills or sore throat, or other symptoms of a cold or flu. Do not treat yourself. This drug decreases your body's ability to fight infections. Try to avoid being around people who are sick. This medicine may increase your risk to bruise or bleed. Call your doctor or health  care professional if you notice any unusual bleeding. Talk to your doctor about your risk of cancer. You may be more at risk for certain types of cancers if you take this medicine. Do not become pregnant while taking this medicine or for at least 6 months after stopping it. Women should inform their doctor if they wish to become pregnant or think they might be pregnant. Women of child-bearing potential will need to have a negative pregnancy test before starting this medicine. There is a potential for serious side effects to an unborn child. Talk to your health care professional or pharmacist for more information. Do not breast-feed an infant while taking this medicine. Men must use a latex condom during sexual contact with a woman while taking this medicine and for at least 4 months after stopping it. A latex condom is needed even if you have had a vasectomy. Contact your doctor right away if your partner becomes pregnant. Do not donate sperm while taking this medicine and for at least 4 months after you stop taking this medicine. Men should inform their doctors if they wish to father a child. This medicine may lower sperm counts. What side effects may  I notice from receiving this medicine? Side effects that you should report to your doctor or health care professional as soon as possible: -allergic reactions like skin rash, itching or hives, swelling of the face, lips, or tongue -low blood counts - this medicine may decrease the number of white blood cells, red blood cells and platelets. You may be at increased risk for infections and bleeding. -signs of infection - fever or chills, cough, sore throat, pain or difficulty passing urine -signs of decreased platelets or bleeding - bruising, pinpoint red spots on the skin, black, tarry stools, blood in the urine -signs of decreased red blood cells - unusually weak or tired, fainting spells, lightheadedness -breathing problems -changes in vision -mouth or  throat sores or ulcers -pain, redness, swelling or irritation at the injection site -pain, tingling, numbness in the hands or feet -redness, blistering, peeling or loosening of the skin, including inside the mouth -seizures -vomiting Side effects that usually do not require medical attention (report to your doctor or health care professional if they continue or are bothersome): -diarrhea -hair loss -loss of appetite -nausea -stomach pain This list may not describe all possible side effects. Call your doctor for medical advice about side effects. You may report side effects to FDA at 1-800-FDA-1088. Where should I keep my medicine? This drug is given in a hospital or clinic and will not be stored at home. NOTE: This sheet is a summary. It may not cover all possible information. If you have questions about this medicine, talk to your doctor, pharmacist, or health care provider.  2019 Elsevier/Gold Standard (2015-10-12 11:53:23)   Denosumab injection What is this medicine? DENOSUMAB (den oh sue mab) slows bone breakdown. Prolia is used to treat osteoporosis in women after menopause and in men, and in people who are taking corticosteroids for 6 months or more. Delton See is used to treat a high calcium level due to cancer and to prevent bone fractures and other bone problems caused by multiple myeloma or cancer bone metastases. Delton See is also used to treat giant cell tumor of the bone. This medicine may be used for other purposes; ask your health care provider or pharmacist if you have questions. COMMON BRAND NAME(S): Prolia, XGEVA What should I tell my health care provider before I take this medicine? They need to know if you have any of these conditions: -dental disease -having surgery or tooth extraction -infection -kidney disease -low levels of calcium or Vitamin D in the blood -malnutrition -on hemodialysis -skin conditions or sensitivity -thyroid or parathyroid disease -an unusual  reaction to denosumab, other medicines, foods, dyes, or preservatives -pregnant or trying to get pregnant -breast-feeding How should I use this medicine? This medicine is for injection under the skin. It is given by a health care professional in a hospital or clinic setting. A special MedGuide will be given to you before each treatment. Be sure to read this information carefully each time. For Prolia, talk to your pediatrician regarding the use of this medicine in children. Special care may be needed. For Delton See, talk to your pediatrician regarding the use of this medicine in children. While this drug may be prescribed for children as young as 13 years for selected conditions, precautions do apply. Overdosage: If you think you have taken too much of this medicine contact a poison control center or emergency room at once. NOTE: This medicine is only for you. Do not share this medicine with others. What if I miss a dose? It is  important not to miss your dose. Call your doctor or health care professional if you are unable to keep an appointment. What may interact with this medicine? Do not take this medicine with any of the following medications: -other medicines containing denosumab This medicine may also interact with the following medications: -medicines that lower your chance of fighting infection -steroid medicines like prednisone or cortisone This list may not describe all possible interactions. Give your health care provider a list of all the medicines, herbs, non-prescription drugs, or dietary supplements you use. Also tell them if you smoke, drink alcohol, or use illegal drugs. Some items may interact with your medicine. What should I watch for while using this medicine? Visit your doctor or health care professional for regular checks on your progress. Your doctor or health care professional may order blood tests and other tests to see how you are doing. Call your doctor or health care  professional for advice if you get a fever, chills or sore throat, or other symptoms of a cold or flu. Do not treat yourself. This drug may decrease your body's ability to fight infection. Try to avoid being around people who are sick. You should make sure you get enough calcium and vitamin D while you are taking this medicine, unless your doctor tells you not to. Discuss the foods you eat and the vitamins you take with your health care professional. See your dentist regularly. Brush and floss your teeth as directed. Before you have any dental work done, tell your dentist you are receiving this medicine. Do not become pregnant while taking this medicine or for 5 months after stopping it. Talk with your doctor or health care professional about your birth control options while taking this medicine. Women should inform their doctor if they wish to become pregnant or think they might be pregnant. There is a potential for serious side effects to an unborn child. Talk to your health care professional or pharmacist for more information. What side effects may I notice from receiving this medicine? Side effects that you should report to your doctor or health care professional as soon as possible: -allergic reactions like skin rash, itching or hives, swelling of the face, lips, or tongue -bone pain -breathing problems -dizziness -jaw pain, especially after dental work -redness, blistering, peeling of the skin -signs and symptoms of infection like fever or chills; cough; sore throat; pain or trouble passing urine -signs of low calcium like fast heartbeat, muscle cramps or muscle pain; pain, tingling, numbness in the hands or feet; seizures -unusual bleeding or bruising -unusually weak or tired Side effects that usually do not require medical attention (report to your doctor or health care professional if they continue or are bothersome): -constipation -diarrhea -headache -joint pain -loss of  appetite -muscle pain -runny nose -tiredness -upset stomach This list may not describe all possible side effects. Call your doctor for medical advice about side effects. You may report side effects to FDA at 1-800-FDA-1088. Where should I keep my medicine? This medicine is only given in a clinic, doctor's office, or other health care setting and will not be stored at home. NOTE: This sheet is a summary. It may not cover all possible information. If you have questions about this medicine, talk to your doctor, pharmacist, or health care provider.  2019 Elsevier/Gold Standard (2018-02-26 16:10:44)

## 2018-11-24 NOTE — Progress Notes (Signed)
Per Dr. Irene Limbo: Ok to treat today with WBC 32k, Plts 77k, and ANC 22.2k.

## 2018-11-25 ENCOUNTER — Inpatient Hospital Stay: Payer: Medicaid Other

## 2018-11-25 ENCOUNTER — Inpatient Hospital Stay: Payer: Medicaid Other | Admitting: Nutrition

## 2018-11-25 VITALS — BP 128/60 | HR 55 | Temp 98.6°F | Resp 18

## 2018-11-25 DIAGNOSIS — Z7189 Other specified counseling: Secondary | ICD-10-CM

## 2018-11-25 DIAGNOSIS — C787 Secondary malignant neoplasm of liver and intrahepatic bile duct: Secondary | ICD-10-CM

## 2018-11-25 DIAGNOSIS — C801 Malignant (primary) neoplasm, unspecified: Principal | ICD-10-CM

## 2018-11-25 DIAGNOSIS — Z5111 Encounter for antineoplastic chemotherapy: Secondary | ICD-10-CM | POA: Diagnosis not present

## 2018-11-25 MED ORDER — SODIUM CHLORIDE 0.9 % IV SOLN
Freq: Once | INTRAVENOUS | Status: AC
  Administered 2018-11-25: 09:00:00 via INTRAVENOUS
  Filled 2018-11-25: qty 250

## 2018-11-25 MED ORDER — SODIUM CHLORIDE 0.9% FLUSH
10.0000 mL | INTRAVENOUS | Status: DC | PRN
  Filled 2018-11-25: qty 10

## 2018-11-25 MED ORDER — SODIUM CHLORIDE 0.9 % IV SOLN
80.0000 mg/m2 | Freq: Once | INTRAVENOUS | Status: AC
  Administered 2018-11-25: 150 mg via INTRAVENOUS
  Filled 2018-11-25: qty 7.5

## 2018-11-25 MED ORDER — DEXAMETHASONE SODIUM PHOSPHATE 10 MG/ML IJ SOLN
INTRAMUSCULAR | Status: AC
  Filled 2018-11-25: qty 1

## 2018-11-25 MED ORDER — HEPARIN SOD (PORK) LOCK FLUSH 100 UNIT/ML IV SOLN
500.0000 [IU] | Freq: Once | INTRAVENOUS | Status: AC | PRN
  Administered 2018-11-25: 500 [IU]
  Filled 2018-11-25: qty 5

## 2018-11-25 MED ORDER — DEXAMETHASONE SODIUM PHOSPHATE 10 MG/ML IJ SOLN
10.0000 mg | Freq: Once | INTRAMUSCULAR | Status: AC
  Administered 2018-11-25: 10 mg via INTRAVENOUS

## 2018-11-25 NOTE — Progress Notes (Signed)
Brief nutrition follow up completed with patient after infusion. He reports increased oral intake and notes weight documented as 170 pounds. Patient has lower extremity edema and was given Lasix. He is drinking Ensure original twice daily. He denies questions.  Nutrition diagnosis: Severe malnutrition improving.  Intervention: Recommended patient continue strategies for increased calorie and protein intake. Continue Ensure original twice daily. Provided coupons. Encourage patient to contact me with further questions or concerns.  Monitoring, evaluation, goals: Patient will tolerate increased calories and protein to minimize loss of lean body mass.  Follow-up to be scheduled as needed.  **Disclaimer: This note was dictated with voice recognition software. Similar sounding words can inadvertently be transcribed and this note may contain transcription errors which may not have been corrected upon publication of note.**

## 2018-11-25 NOTE — Patient Instructions (Signed)
[facility] Discharge Instructions for Patients Receiving Chemotherapy  Today you received the following chemotherapy agents: Etoposide.     To help prevent nausea and vomiting after your treatment, we encourage you to take your nausea medication as directed.  If you develop nausea and vomiting that is not controlled by your nausea medication, call the clinic.   BELOW ARE SYMPTOMS THAT SHOULD BE REPORTED IMMEDIATELY:  *FEVER GREATER THAN 100.5 F  *CHILLS WITH OR WITHOUT FEVER  NAUSEA AND VOMITING THAT IS NOT CONTROLLED WITH YOUR NAUSEA MEDICATION  *UNUSUAL SHORTNESS OF BREATH  *UNUSUAL BRUISING OR BLEEDING  TENDERNESS IN MOUTH AND THROAT WITH OR WITHOUT PRESENCE OF ULCERS  *URINARY PROBLEMS  *BOWEL PROBLEMS  UNUSUAL RASH Items with * indicate a potential emergency and should be followed up as soon as possible.  Feel free to call the clinic should you have any questions or concerns. The clinic phone number is [phone].  Please show the [place] at check-in to the Emergency Department and triage nurse.    Constipation, Adult Constipation is when a person:  Poops (has a bowel movement) fewer times in a week than normal.  Has a hard time pooping.  Has poop that is dry, hard, or bigger than normal. Follow these instructions at home: Eating and drinking   Eat foods that have a lot of fiber, such as: ? Fresh fruits and vegetables. ? Whole grains. ? Beans.  Eat less of foods that are high in fat, low in fiber, or overly processed, such as: ? [place] fries. ? Hamburgers. ? Cookies. ? Candy. ? Soda.  Drink enough fluid to keep your pee (urine) clear or pale yellow. General instructions  Exercise regularly or as told by your doctor.  Go to the restroom when you feel like you need to poop. Do not hold it in.  Take over-the-counter and prescription medicines only as told by your doctor. These include any fiber supplements.  Do pelvic  floor retraining exercises, such as: ? Doing deep breathing while relaxing your lower belly (abdomen). ? Relaxing your pelvic floor while pooping.  Watch your condition for any changes.  Keep all follow-up visits as told by your doctor. This is important. Contact a doctor if:  You have pain that gets worse.  You have a fever.  You have not pooped for 4 days.  You throw up (vomit).  You are not hungry.  You lose weight.  You are bleeding from the anus.  You have thin, pencil-like poop (stool). Get help right away if:  You have a fever, and your symptoms suddenly get worse.  You leak poop or have blood in your poop.  Your belly feels hard or bigger than normal (is bloated).  You have very bad belly pain.  You feel dizzy or you faint. This information is not intended to replace advice given to you by your health care provider. Make sure you discuss any questions you have with your health care provider. Document Released: 04/07/2008 Document Revised: 05/09/2016 Document Reviewed: 04/09/2016 Elsevier Interactive Patient Education  2019 [employer].

## 2018-11-26 ENCOUNTER — Inpatient Hospital Stay: Payer: Medicaid Other

## 2018-11-26 ENCOUNTER — Ambulatory Visit: Payer: Self-pay

## 2018-11-26 ENCOUNTER — Other Ambulatory Visit: Payer: Self-pay

## 2018-11-26 VITALS — BP 126/73 | HR 64 | Temp 98.4°F | Resp 18

## 2018-11-26 DIAGNOSIS — C801 Malignant (primary) neoplasm, unspecified: Principal | ICD-10-CM

## 2018-11-26 DIAGNOSIS — Z7189 Other specified counseling: Secondary | ICD-10-CM

## 2018-11-26 DIAGNOSIS — C787 Secondary malignant neoplasm of liver and intrahepatic bile duct: Secondary | ICD-10-CM

## 2018-11-26 DIAGNOSIS — Z5111 Encounter for antineoplastic chemotherapy: Secondary | ICD-10-CM | POA: Diagnosis not present

## 2018-11-26 MED ORDER — DEXAMETHASONE SODIUM PHOSPHATE 10 MG/ML IJ SOLN
INTRAMUSCULAR | Status: AC
  Filled 2018-11-26: qty 1

## 2018-11-26 MED ORDER — SODIUM CHLORIDE 0.9 % IV SOLN
Freq: Once | INTRAVENOUS | Status: AC
  Administered 2018-11-26: 08:00:00 via INTRAVENOUS
  Filled 2018-11-26: qty 250

## 2018-11-26 MED ORDER — DEXAMETHASONE SODIUM PHOSPHATE 10 MG/ML IJ SOLN
10.0000 mg | Freq: Once | INTRAMUSCULAR | Status: AC
  Administered 2018-11-26: 10 mg via INTRAVENOUS

## 2018-11-26 MED ORDER — SODIUM CHLORIDE 0.9% FLUSH
10.0000 mL | INTRAVENOUS | Status: DC | PRN
  Administered 2018-11-26: 10 mL
  Filled 2018-11-26: qty 10

## 2018-11-26 MED ORDER — SODIUM CHLORIDE 0.9 % IV SOLN
80.0000 mg/m2 | Freq: Once | INTRAVENOUS | Status: AC
  Administered 2018-11-26: 150 mg via INTRAVENOUS
  Filled 2018-11-26: qty 7.5

## 2018-11-26 MED ORDER — HEPARIN SOD (PORK) LOCK FLUSH 100 UNIT/ML IV SOLN
500.0000 [IU] | Freq: Once | INTRAVENOUS | Status: AC | PRN
  Administered 2018-11-26: 500 [IU]
  Filled 2018-11-26: qty 5

## 2018-11-26 NOTE — Patient Instructions (Signed)
[facility] Discharge Instructions for Patients Receiving Chemotherapy  Today you received the following chemotherapy agents Etoposide.   To help prevent nausea and vomiting after your treatment, we encourage you to take your nausea medication as instructed.  If you develop nausea and vomiting that is not controlled by your nausea medication, call the clinic.   BELOW ARE SYMPTOMS THAT SHOULD BE REPORTED IMMEDIATELY:  *FEVER GREATER THAN 100.5 F  *CHILLS WITH OR WITHOUT FEVER  NAUSEA AND VOMITING THAT IS NOT CONTROLLED WITH YOUR NAUSEA MEDICATION  *UNUSUAL SHORTNESS OF BREATH  *UNUSUAL BRUISING OR BLEEDING  TENDERNESS IN MOUTH AND THROAT WITH OR WITHOUT PRESENCE OF ULCERS  *URINARY PROBLEMS  *BOWEL PROBLEMS  UNUSUAL RASH Items with * indicate a potential emergency and should be followed up as soon as possible.  Feel free to call the clinic should you have any questions or concerns. The clinic phone number is [phone].  Please show the [place] at check-in to the Emergency Department and triage nurse.

## 2018-11-27 ENCOUNTER — Inpatient Hospital Stay: Payer: Medicaid Other

## 2018-11-27 VITALS — BP 149/73 | HR 58 | Temp 97.8°F | Resp 16

## 2018-11-27 DIAGNOSIS — C787 Secondary malignant neoplasm of liver and intrahepatic bile duct: Secondary | ICD-10-CM

## 2018-11-27 DIAGNOSIS — Z7189 Other specified counseling: Secondary | ICD-10-CM

## 2018-11-27 DIAGNOSIS — C801 Malignant (primary) neoplasm, unspecified: Principal | ICD-10-CM

## 2018-11-27 DIAGNOSIS — Z5111 Encounter for antineoplastic chemotherapy: Secondary | ICD-10-CM | POA: Diagnosis not present

## 2018-11-27 MED ORDER — PEGFILGRASTIM INJECTION 6 MG/0.6ML ~~LOC~~
6.0000 mg | PREFILLED_SYRINGE | Freq: Once | SUBCUTANEOUS | Status: AC
  Administered 2018-11-27: 6 mg via SUBCUTANEOUS

## 2018-11-27 MED ORDER — PEGFILGRASTIM INJECTION 6 MG/0.6ML ~~LOC~~
PREFILLED_SYRINGE | SUBCUTANEOUS | Status: AC
  Filled 2018-11-27: qty 0.6

## 2018-11-27 NOTE — Patient Instructions (Signed)
Pegfilgrastim injection  What is this medicine?  PEGFILGRASTIM (PEG fil gra stim) is a long-acting granulocyte colony-stimulating factor that stimulates the growth of neutrophils, a type of white blood cell important in the body's fight against infection. It is used to reduce the incidence of fever and infection in patients with certain types of cancer who are receiving chemotherapy that affects the bone marrow, and to increase survival after being exposed to high doses of radiation.  This medicine may be used for other purposes; ask your health care provider or pharmacist if you have questions.  COMMON BRAND NAME(S): Fulphila, Neulasta, UDENYCA  What should I tell my health care provider before I take this medicine?  They need to know if you have any of these conditions:  -kidney disease  -latex allergy  -ongoing radiation therapy  -sickle cell disease  -skin reactions to acrylic adhesives (On-Body Injector only)  -an unusual or allergic reaction to pegfilgrastim, filgrastim, other medicines, foods, dyes, or preservatives  -pregnant or trying to get pregnant  -breast-feeding  How should I use this medicine?  This medicine is for injection under the skin. If you get this medicine at home, you will be taught how to prepare and give the pre-filled syringe or how to use the On-body Injector. Refer to the patient Instructions for Use for detailed instructions. Use exactly as directed. Tell your healthcare provider immediately if you suspect that the On-body Injector may not have performed as intended or if you suspect the use of the On-body Injector resulted in a missed or partial dose.  It is important that you put your used needles and syringes in a special sharps container. Do not put them in a trash can. If you do not have a sharps container, call your pharmacist or healthcare provider to get one.  Talk to your pediatrician regarding the use of this medicine in children. While this drug may be prescribed for  selected conditions, precautions do apply.  Overdosage: If you think you have taken too much of this medicine contact a poison control center or emergency room at once.  NOTE: This medicine is only for you. Do not share this medicine with others.  What if I miss a dose?  It is important not to miss your dose. Call your doctor or health care professional if you miss your dose. If you miss a dose due to an On-body Injector failure or leakage, a new dose should be administered as soon as possible using a single prefilled syringe for manual use.  What may interact with this medicine?  Interactions have not been studied.  Give your health care provider a list of all the medicines, herbs, non-prescription drugs, or dietary supplements you use. Also tell them if you smoke, drink alcohol, or use illegal drugs. Some items may interact with your medicine.  This list may not describe all possible interactions. Give your health care provider a list of all the medicines, herbs, non-prescription drugs, or dietary supplements you use. Also tell them if you smoke, drink alcohol, or use illegal drugs. Some items may interact with your medicine.  What should I watch for while using this medicine?  You may need blood work done while you are taking this medicine.  If you are going to need a MRI, CT scan, or other procedure, tell your doctor that you are using this medicine (On-Body Injector only).  What side effects may I notice from receiving this medicine?  Side effects that you should report to   your doctor or health care professional as soon as possible:  -allergic reactions like skin rash, itching or hives, swelling of the face, lips, or tongue  -back pain  -dizziness  -fever  -pain, redness, or irritation at site where injected  -pinpoint red spots on the skin  -red or dark-brown urine  -shortness of breath or breathing problems  -stomach or side pain, or pain at the shoulder  -swelling  -tiredness  -trouble passing urine or  change in the amount of urine  Side effects that usually do not require medical attention (report to your doctor or health care professional if they continue or are bothersome):  -bone pain  -muscle pain  This list may not describe all possible side effects. Call your doctor for medical advice about side effects. You may report side effects to FDA at 1-800-FDA-1088.  Where should I keep my medicine?  Keep out of the reach of children.  If you are using this medicine at home, you will be instructed on how to store it. Throw away any unused medicine after the expiration date on the label.  NOTE: This sheet is a summary. It may not cover all possible information. If you have questions about this medicine, talk to your doctor, pharmacist, or health care provider.   2019 [employer] (2018-01-25 16:57:08)

## 2018-12-01 ENCOUNTER — Inpatient Hospital Stay: Payer: Medicaid Other

## 2018-12-01 DIAGNOSIS — E43 Unspecified severe protein-calorie malnutrition: Secondary | ICD-10-CM

## 2018-12-01 DIAGNOSIS — C787 Secondary malignant neoplasm of liver and intrahepatic bile duct: Secondary | ICD-10-CM

## 2018-12-01 DIAGNOSIS — C801 Malignant (primary) neoplasm, unspecified: Principal | ICD-10-CM

## 2018-12-01 DIAGNOSIS — Z5111 Encounter for antineoplastic chemotherapy: Secondary | ICD-10-CM | POA: Diagnosis not present

## 2018-12-01 LAB — CBC WITH DIFFERENTIAL/PLATELET
Abs Immature Granulocytes: 0.51 10*3/uL — ABNORMAL HIGH (ref 0.00–0.07)
Basophils Absolute: 0.1 10*3/uL (ref 0.0–0.1)
Basophils Relative: 1 %
Eosinophils Absolute: 0 10*3/uL (ref 0.0–0.5)
Eosinophils Relative: 0 %
HCT: 31 % — ABNORMAL LOW (ref 39.0–52.0)
Hemoglobin: 9.7 g/dL — ABNORMAL LOW (ref 13.0–17.0)
Immature Granulocytes: 2 %
Lymphocytes Relative: 8 %
Lymphs Abs: 1.7 10*3/uL (ref 0.7–4.0)
MCH: 27.2 pg (ref 26.0–34.0)
MCHC: 31.3 g/dL (ref 30.0–36.0)
MCV: 86.8 fL (ref 80.0–100.0)
MONO ABS: 0.7 10*3/uL (ref 0.1–1.0)
Monocytes Relative: 3 %
Neutro Abs: 18.8 10*3/uL — ABNORMAL HIGH (ref 1.7–7.7)
Neutrophils Relative %: 86 %
Platelets: 129 10*3/uL — ABNORMAL LOW (ref 150–400)
RBC: 3.57 MIL/uL — ABNORMAL LOW (ref 4.22–5.81)
RDW: 13 % (ref 11.5–15.5)
WBC: 21.8 10*3/uL — AB (ref 4.0–10.5)
nRBC: 0 % (ref 0.0–0.2)

## 2018-12-01 LAB — SAMPLE TO BLOOD BANK

## 2018-12-07 ENCOUNTER — Encounter: Payer: Self-pay | Admitting: Gastroenterology

## 2018-12-07 ENCOUNTER — Ambulatory Visit (INDEPENDENT_AMBULATORY_CARE_PROVIDER_SITE_OTHER): Payer: Self-pay | Admitting: Gastroenterology

## 2018-12-07 VITALS — BP 142/68 | HR 74 | Ht 67.0 in | Wt 158.0 lb

## 2018-12-07 DIAGNOSIS — C787 Secondary malignant neoplasm of liver and intrahepatic bile duct: Secondary | ICD-10-CM

## 2018-12-07 DIAGNOSIS — Z1211 Encounter for screening for malignant neoplasm of colon: Secondary | ICD-10-CM

## 2018-12-07 NOTE — Progress Notes (Signed)
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 Sullivan Lone for consideration of endoscopic evaluation in light of neuroendocrine tumor.   A few months ago the patient presented with right upper quadrant pain and weight loss. He had a CT scan done to further evaluate which showed numerous ill-defined hypodense masses throughout the entire liver compatible with metastatic disease. He also had a 2-1/2 cm mass over the right kidney. He had a CA-19-9 level that was elevated at 652. 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 He had a PET scan showing diffuse hepatic and osseous metastatic disease without primary neoplasm. CT scan on 12/29 showed a 64mm indeterminate hypodense lesion on the pancreatic tail  He's had some mildly elevated liver enzymes during this time presumed to metastatic disease. Hepatitis B and C serologies negative. He is currently being managed with chemotherapy, Carboplatin and Etoposide with G-CSF for up to 6 cycles,. He is also on Xgeva. He has experienced some leukopenia and thrombocytopenia with his therapy, most recent labs show improvement.   He has never had a colonoscopy before. He denies any problems with his bowels at present. She previously had constipation prior to chemotherapy, since being on chemotherapy he's had normal bowel habits. No blood in the stools. He denies any history of reflux. No dysphagia. He is eating well. No nausea or vomiting. He does have some occasional left mid abdominal pain with eating. Overall he states he is lost 40 pounds since his diagnosis, although he thinks is gaining some weight back. He has never had a prior upper endoscopy.     Past Medical History:  Diagnosis Date  . Arthritis    "knees; left hand"  . Cancer ([place])   . Gout   . Heart  murmur    Longstanding. 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 Hypertension   . Near syncope    05/2011 with tachypalpitations & with echo showing mild LVH, normal EF 55-60%, trivial AR     Past Surgical History:  Procedure Laterality Date  . IR IMAGING GUIDED PORT INSERTION  10/26/2018  . NO PAST SURGERIES     Family History  Problem Relation Age of Onset  . Cancer Mother        Possibly pancreatic. Died at age 49  . Prostate cancer Father   . Breast cancer Cousin   . Diabetes Paternal Uncle   . Heart disease Paternal Uncle    Social History   Tobacco Use  . Smoking status: Never Smoker  . Smokeless tobacco: Never Used  Substance Use Topics  . Alcohol use: Not Currently    Comment: states he has cut back/ every now and again  . Drug use: No   Current Outpatient Medications  Medication Sig Dispense Refill  . colchicine 0.6 MG tablet Take 1 tablet (0.6 mg total) by mouth See admin instructions. Take 1.2mg  (2 tablets) followed by .6mg  (1 tablet) 1 hour later. 3 tablet 0  . furosemide (LASIX) Herbert MG tablet Take 1 tablet (Herbert mg total) by mouth daily. 30 tablet 1  . lidocaine-prilocaine (EMLA) cream Apply to affected area once 30 g 3  . LORazepam (ATIVAN) 0.5 MG tablet Take 1 tablet (0.5 mg total) by mouth every 6 (six) hours as needed (Nausea or vomiting). 30 tablet 0  .  ondansetron (ZOFRAN ODT) 4 MG disintegrating tablet Take 1 tablet (4 mg total) by mouth every 8 (eight) hours as needed for nausea or vomiting. 6 tablet 0  . oxyCODONE (OXY IR/ROXICODONE) 5 MG immediate release tablet Take 1-2 tablets (5-10 mg total) by mouth every 4 (four) hours as needed for severe pain. 90 tablet 0  . potassium chloride SA (K-DUR,KLOR-CON) Herbert MEQ tablet Take 1 tablet (Herbert mEq total) by mouth daily. With lasix 30 tablet 1  . prochlorperazine (COMPAZINE) 10 MG tablet Take 1 tablet (10 mg total) by mouth every 6 (six) hours as needed (Nausea or vomiting). 30 tablet 1   No  current facility-administered medications for this visit.    No Known Allergies   Review of Systems: All systems reviewed and negative except where noted in HPI.   Lab Results  Component Value Date   WBC 21.8 (H) 12/01/2018   HGB 9.7 (L) 12/01/2018   HCT 31.0 (L) 12/01/2018   MCV 86.8 12/01/2018   PLT 129 (L) 12/01/2018    Lab Results  Component Value Date   CREATININE 0.69 11/23/2018   BUN 8 11/23/2018   NA 139 11/23/2018   K 3.8 11/23/2018   CL 108 11/23/2018   CO2 25 11/23/2018    Lab Results  Component Value Date   ALT 11 11/23/2018   AST 17 11/23/2018   ALKPHOS 298 (H) 11/23/2018   BILITOT 0.6 11/23/2018     Physical Exam: BP (!) 142/68   Pulse 74   Ht 5\' 7"  (1.702 m)   Wt 158 lb (71.7 kg)   BMI 24.75 kg/m  Constitutional: Pleasant Hernandez in no acute distress. HEENT: Normocephalic and atraumatic. Conjunctivae are normal. No scleral icterus. Neck supple.  Cardiovascular: Normal rate, regular rhythm.  Pulmonary/chest: Effort normal and breath sounds normal. No wheezing, rales or rhonchi. Abdominal: Soft, nondistended, nontender.  Extremities: no edema Lymphadenopathy: No cervical adenopathy noted. Neurological: Alert and oriented to person place and time. Skin: Skin is warm and dry. No rashes noted. Psychiatric: Normal mood and affect. Behavior is normal.   ASSESSMENT AND PLAN: 58 year old Hernandez here for new patient consultation regarding the following:  Metastatic neuroendocrine / small cell carcinoma to the liver / colon cancer screening - history and course as outlined above. Unclear primary source for his malignancy. There is an small indeterminate lesion at the pancreatic tail otherwise noted on imaging. Otherwise while his bowel looks okay on PET scan, neuroendocrine tumors can arise from the GI tract. I offered him an upper endoscopy and colonoscopy if he wanted further evaluation for this. I discussed with him with those exams in detail and the risks  of the procedure and anesthesia. We need to discuss the timing of when we should do this based on his anticipated abnormality in blood counts with chemotherapy, it's possible we could do it this week, however his preference is to finish his chemotherapy first, however that will not be until April. I will touch base with Dr. Irene Limbo to determine timing of these in regards to how long he will be on chemotherapy. He is agreeable to proceed with scheduling after this conversation. Will get back to him shortly, all questions answered.   [place] Cellar, MD [facility]  CC: Brunetta Genera, MD

## 2018-12-07 NOTE — Progress Notes (Signed)
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  HISTORY OF PRESENTING ILLNESS:   [name] is a wonderful 58 y.o. male who has been referred to [place] by Dr. Francine Graven for evaluation and management of Liver masses. He is accompanied today by his wife and aunt. The pt reports that he is doing well overall.   The pt presented to the ED on 10/01/18 regarding persistent RUQ abdominal pain which began a month prior, and noted associations with nausea and 40 pound weight loss. He was evaluated with a CT A/P, as noted below, which revealed liver masses.   The pt reports that he first began feeling RUQ abdominal pain about 3-4 weeks ago, but began losing weight about 2 months ago. He notes that he began feeling more fatigue about 2 months ago as well.  The pt notes that the color of his bowels became dark black and denies taking Iron pills or bismuth medications. He notes that he began feeling constipated as well, feeling backed up. He notes that his stools began looking thinner in caliber. The pt notes that his appetite began decreasing over the last 3-4 weeks ago, and some of this he attributes to intermittent nausea. He denies problems swallowing food. He endorses RUQ pain, and denies vomiting and new cough, new bone pains, testicular pain or swelling, problems passing urine, blood in the urine. The pt has never had a colonoscopy and does not have a PCP, and cites his lack of health insurance as the reason.  The pt was released from the ED with Oxycodone and has been using this to treat his RUQ pain successfully.   The pt notes gout and a heart murmur as previous medical history. He notes that he hasn't consumed alcohol in the past 2 years whatsoever, but prior to this alcohol was  a problem for him.   Of note prior to the patient's visit today, pt has had a CT A/P completed on 10/01/18 with results revealing Interval development of numerous ill-defined hypodense masses throughout the entire liver compatible with metastatic disease. There is a 2.5 cm enhancing mass over the upper pole cortex of the right kidney suspicious for renal cell carcinoma. 2.  No acute findings in the abdomen/pelvis. 3. 1 cm hypodensity over the pancreatic tail not well seen on previous noncontrast exams. MR may be helpful for further evaluation on an elective basis.   Most recent lab results (10/06/18) of CBC w/diff and CMP is as follows: all values are WNL except for Monocytes abs at 1.2k, Sodium at 134, Albumin at 3.0, AST at 77, ALT at 63, Alk Phos at 392, Total Bilirubin at 2.3   On review of systems, pt reports new constipation, smaller caliber stools, dark stools, unexpected weight loss, RUQ pain, new fatigue, and denies vomiting, cough, new SOB, changes in breathing, new bone pains, leg swelling, blood in the urine, changes in urination, fevers, pain along the spine, and any other symptoms.   On PMHx the pt reports Gout, heart murmur, previous alcohol abuse. He denies any surgeries.  On Social Hx the pt reports that he hasn't consumed alcohol in the past 2 years whatsoever. He notes that alcohol abuse was previously a concern. He denies ever smoking cigarettes or doing drugs. He denies any radiation or chemical exposure.  On Family  Hx the pt reports mother with pancreatic cancer in 43s, maternal aunts with high blood pressure and heart failure, maternal aunt with breast cancer.  Interval History:  [name] returns today for management, evaluation, during C1 treatment of his newly diagnosed High grade neuroendocrine carcinoma. The patient's last visit with [place] was on 11/23/18. He is accompanied today by his wife and daughter. The pt reports that he is doing well overall.   The pt reports that  his leg swelling has resolved, after he regularly took 51m Lasix in the interim. He notes that his ankle swelling is resolving after a night's rest.  The pt was unable to purchase Marinol but notes that he has continued eating well and consuming 2 Ensure each day. He notes that he has a little abdominal pain, "every now and then," but endorses controlled pain overall. His activity levels continue to improve.  Lab results today (12/08/18) of CBC w/diff is as follows: all values are WNL except for WBC at 14.3k, RBC at 3.48, HGB at 9.7, HCT at 31.2, RDW at 15.7, PLT at 112k, nRBC at 0.4%, ANC at 9.2k, Monocytes abs at 1.4k, Abs immature granulocytes at 1.89k.  On review of systems, pt reports resolved leg swelling, eating well, increasing activity, stable energy levels,  and denies nose bleeds, gum bleeds, fevers, chills, current abdominal pain, and any other symptoms.    MEDICAL HISTORY:  Past Medical History:  Diagnosis Date  . Arthritis    "knees; left hand"  [place]   . Gout   . Heart murmur    Longstanding. 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 Hypertension   . Near syncope    05/2011 with tachypalpitations & with echo showing mild LVH, normal EF 55-60%, trivial AR    SURGICAL HISTORY: Past Surgical History:  Procedure Laterality Date  . IR IMAGING GUIDED PORT INSERTION  10/26/2018  . NO PAST SURGERIES      SOCIAL HISTORY: Social History   Socioeconomic History  . Marital status: Married    Spouse name: Not on file  . Number of children: 1  . Years of education: Not on file  . Highest education level: Not on file  Occupational History  . Occupation: Unemployed  Social Needs  . Financial resource strain: Not on file  . Food insecurity:    Worry: Not on file    Inability: Not on file  . Transportation needs:    Medical: Not on file    Non-medical: Not on file  Tobacco Use  . Smoking status: Never Smoker  . Smokeless tobacco: Never Used    Substance and Sexual Activity  . Alcohol use: Not Currently    Comment: states he has cut back/ every now and again  . Drug use: No  . Sexual activity: Yes    Partners: Female  Lifestyle  . Physical activity:    Days per week: Not on file    Minutes per session: Not on file  . Stress: Not on file  Relationships  . Social connections:    Talks on phone: Not on file    Gets together: Not on file    Attends religious service: Not on file    Active member of club or organization: Not on file    Attends meetings of clubs or organizations: Not on file    Relationship status: Not on file  . Intimate partner violence:    Fear of current or ex partner:  Not on file    Emotionally abused: Not on file    Physically abused: Not on file    Forced sexual activity: Not on file  Other Topics Concern  . Not on file  Social History Narrative   Married. 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 Is from [place].     FAMILY HISTORY: Family History  Problem Relation Age of Onset  . Cancer Mother        Possibly pancreatic. Died at age 68  . Prostate cancer Father   . Breast cancer Cousin   . Diabetes Paternal Uncle   . Heart disease Paternal Uncle     ALLERGIES:  has No Known Allergies.  MEDICATIONS:  Current Outpatient Medications  Medication Sig Dispense Refill  . colchicine 0.6 MG tablet Take 1 tablet (0.6 mg total) by mouth See admin instructions. Take 1.41m (2 tablets) followed by .689m(1 tablet) 1 hour later. 3 tablet 0  . furosemide (LASIX) 20 MG tablet Take 1 tablet (20 mg total) by mouth daily. 30 tablet 1  . lidocaine-prilocaine (EMLA) cream Apply to affected area once 30 g 3  . LORazepam (ATIVAN) 0.5 MG tablet Take 1 tablet (0.5 mg total) by mouth every 6 (six) hours as needed (Nausea or vomiting). 30 tablet 0  . ondansetron (ZOFRAN ODT) 4 MG disintegrating tablet Take 1 tablet (4 mg total) by mouth every 8 (eight) hours as needed  for nausea or vomiting. 6 tablet 0  . oxyCODONE (OXY IR/ROXICODONE) 5 MG immediate release tablet Take 1-2 tablets (5-10 mg total) by mouth every 4 (four) hours as needed for severe pain. 90 tablet 0  . potassium chloride SA (K-DUR,KLOR-CON) 20 MEQ tablet Take 1 tablet (20 mEq total) by mouth daily. With lasix 30 tablet 1  . prochlorperazine (COMPAZINE) 10 MG tablet Take 1 tablet (10 mg total) by mouth every 6 (six) hours as needed (Nausea or vomiting). 30 tablet 1   No current facility-administered medications for this visit.     REVIEW OF SYSTEMS:    A 10+ POINT REVIEW OF SYSTEMS WAS OBTAINED including neurology, dermatology, psychiatry, cardiac, respiratory, lymph, extremities, GI, GU, Musculoskeletal, constitutional, breasts, reproductive, HEENT.  All pertinent positives are noted in the HPI.  All others are negative.   PHYSICAL EXAMINATION: ECOG PERFORMANCE STATUS: 1 - Symptomatic but completely ambulatory  Vitals:   12/08/18 0905  BP: 132/66  Pulse: (!) 58  Resp: 18  Temp: 98.3 F (36.8 C)  SpO2: 100%   Filed Weights   12/08/18 0905  Weight: 157 lb 6.4 oz (71.4 kg)   .Body mass index is 24.65 kg/m.  GENERAL:alert, in no acute distress and comfortable SKIN: no acute rashes, no significant lesions EYES: conjunctiva are pink and non-injected, sclera anicteric OROPHARYNX: MMM, no exudates, no oropharyngeal erythema or ulceration NECK: supple, no JVD LYMPH:  no palpable lymphadenopathy in the cervical, axillary or inguinal regions LUNGS: clear to auscultation b/l with normal respiratory effort HEART: regular rate & rhythm ABDOMEN:  normoactive bowel sounds , tender over the RUQ, not distended. Improving hepatomegaly, now palpable 3cm under right mid- costal line Extremity: trace pedal edema PSYCH: alert & oriented x 3 with fluent speech NEURO: no focal motor/sensory deficits  LABORATORY DATA:  I have reviewed the data as listed  . CBC Latest Ref Rng & Units 12/08/2018  12/01/2018 11/23/2018  WBC 4.0 - 10.5 K/uL 14.3(H) 21.8(H) 32.2(H)  Hemoglobin 13.0 - 17.0 g/dL 9.7(L) 9.7(L) 9.9(L)  Hematocrit 39.0 - 52.0 % 31.2(L) 31.0(L) 30.8(L)  Platelets 150 - 400 K/uL 112(L) 129(L) 77(L)    . CMP Latest Ref Rng & Units 11/23/2018 11/10/2018 11/04/2018  Glucose 70 - 99 mg/dL 108(H) 97 108(H)  BUN 6 - 20 mg/dL '8 11 14  ' Creatinine 0.61 - 1.24 mg/dL 0.69 0.63 0.96  Sodium 135 - 145 mmol/L 139 134(L) 133(L)  Potassium 3.5 - 5.1 mmol/L 3.8 4.6 4.9  Chloride 98 - 111 mmol/L 108 105 100  CO2 22 - 32 mmol/L '25 23 23  ' Calcium 8.9 - 10.3 mg/dL 7.5(L) 7.7(L) 9.5  Total Protein 6.5 - 8.1 g/dL 6.3(L) 6.4(L) 7.7  Total Bilirubin 0.3 - 1.2 mg/dL 0.6 1.1 1.6(H)  Alkaline Phos 38 - 126 U/L 298(H) 203(H) 276(H)  AST 15 - 41 U/L 17 27 41  ALT 0 - 44 U/L '11 19 30    ' 10/08/18 Liver Biopsy:    RADIOGRAPHIC STUDIES: I have personally reviewed the radiological images as listed and agreed with the findings in the report. No results found.  ASSESSMENT & PLAN:  58 y.o. male with  1. 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 There is a 2.5 cm enhancing mass over the upper pole cortex of the right kidney suspicious for renal cell carcinoma. 2.  No acute findings in the abdomen/pelvis. 3. 1 cm hypodensity over the pancreatic tail not well seen on previous noncontrast exams. MR may be helpful for further evaluation on an elective basis.    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 2. Upper pole right renal lesion is  not definitely hypermetabolic and has been present since 2012. I think is unlikely the cause of the metastatic disease. Recommend liver biopsy for tissue diagnosis. If needed, an MRI of the abdomen without and with contrast may be helpful for further evaluation of the renal lesion. 3. Two sub 3 mm right upper lobe pulmonary nodules are indeterminate.   10/26/18 MRI Brain revealed No acute abnormality and negative for metastatic disease.  2. Abnormal LFTs - likely from liver metastases  Labs upon initial presentation from 10/06/18, some borderline monocytosis at 1.2k, otherwise normal blood counts, AST at 77, ALT at 63, Alk Phos at 392, Total Bilirubin at 2.3   3. Rt renal mass - concerning for RCC  PLAN:  -Discussed pt labwork today, 12/08/18; WBC at 14.3k in setting of G-CSF support, PLT holding at 112k, HGB stable at 9.7 -Regarding the patient's thrombocytopenia, did decrease doses of Carboplatin by 20% and Etoposide by 20% for C2 -Will consider slight increase from 20% dose reduction to 10% dose reduction for C3 Carboplatin and Etoposide if PLT continue to normalize or remain stable. C3 infusion in one week on 12/15/18 -Continue with compression socks and leg elevation  -Lasix as needed, with Potassium replacement  -Continue high protein and high calorie meal supplements such as Boost or Ensure -Plan for Carboplatin and Etoposide every 3 weeks, with G-CSF support for up to 6 cycles -Strongly recommended that the pt not lift, push, or pull anything to prevent the risk of pathologic fractures as the PET/CT  does indicate bone involvement -Continue Xgeva every 4 weeks -Oxycodone for pain mx, up to every 6 hours -Recommended that the pt continue to eat well, drink at least 48-64 oz of water each day, and walk 20-30 minutes each day. -Will see the pt back in one week with C3   F/u as per appointment on 2/12 for C3 of chemotherapy, labs and MD visit    All of the patients questions were answered  with apparent satisfaction. The patient knows to call the clinic with any problems, questions or concerns.  The total time spent in the appt was 25 minutes and more than 50% was on counseling and direct patient cares.    Sullivan Lone MD MS AAHIVMS [facility] Hematology/Oncology Physician [facility]  (Office):       [phone] (Work cell):  [phone] (Fax):           [phone]  12/08/2018 9:21 AM  I, Baldwin [place], am acting as a scribe for Dr. Sullivan Lone.   .I have reviewed the above documentation for accuracy and completeness, and I agree with the above. Brunetta Genera MD

## 2018-12-08 ENCOUNTER — Inpatient Hospital Stay: Payer: Medicaid Other | Attending: Hematology

## 2018-12-08 ENCOUNTER — Telehealth: Payer: Self-pay

## 2018-12-08 ENCOUNTER — Inpatient Hospital Stay (HOSPITAL_BASED_OUTPATIENT_CLINIC_OR_DEPARTMENT_OTHER): Payer: Medicaid Other | Admitting: Hematology

## 2018-12-08 VITALS — BP 132/66 | HR 58 | Temp 98.3°F | Resp 18 | Ht 67.0 in | Wt 157.4 lb

## 2018-12-08 DIAGNOSIS — D72821 Monocytosis (symptomatic): Secondary | ICD-10-CM

## 2018-12-08 DIAGNOSIS — C7A1 Malignant poorly differentiated neuroendocrine tumors: Secondary | ICD-10-CM | POA: Diagnosis not present

## 2018-12-08 DIAGNOSIS — R945 Abnormal results of liver function studies: Secondary | ICD-10-CM

## 2018-12-08 DIAGNOSIS — R918 Other nonspecific abnormal finding of lung field: Secondary | ICD-10-CM

## 2018-12-08 DIAGNOSIS — C801 Malignant (primary) neoplasm, unspecified: Secondary | ICD-10-CM

## 2018-12-08 DIAGNOSIS — Z79899 Other long term (current) drug therapy: Secondary | ICD-10-CM | POA: Diagnosis not present

## 2018-12-08 DIAGNOSIS — C7B02 Secondary carcinoid tumors of liver: Secondary | ICD-10-CM | POA: Insufficient documentation

## 2018-12-08 DIAGNOSIS — N2889 Other specified disorders of kidney and ureter: Secondary | ICD-10-CM

## 2018-12-08 DIAGNOSIS — E43 Unspecified severe protein-calorie malnutrition: Secondary | ICD-10-CM

## 2018-12-08 DIAGNOSIS — M109 Gout, unspecified: Secondary | ICD-10-CM | POA: Insufficient documentation

## 2018-12-08 DIAGNOSIS — Z5111 Encounter for antineoplastic chemotherapy: Secondary | ICD-10-CM | POA: Diagnosis present

## 2018-12-08 DIAGNOSIS — C787 Secondary malignant neoplasm of liver and intrahepatic bile duct: Secondary | ICD-10-CM

## 2018-12-08 DIAGNOSIS — D696 Thrombocytopenia, unspecified: Secondary | ICD-10-CM | POA: Diagnosis not present

## 2018-12-08 LAB — CBC WITH DIFFERENTIAL/PLATELET
Abs Immature Granulocytes: 1.89 10*3/uL — ABNORMAL HIGH (ref 0.00–0.07)
Basophils Absolute: 0.1 10*3/uL (ref 0.0–0.1)
Basophils Relative: 1 %
Eosinophils Absolute: 0 10*3/uL (ref 0.0–0.5)
Eosinophils Relative: 0 %
HCT: 31.2 % — ABNORMAL LOW (ref 39.0–52.0)
Hemoglobin: 9.7 g/dL — ABNORMAL LOW (ref 13.0–17.0)
IMMATURE GRANULOCYTES: 13 %
Lymphocytes Relative: 13 %
Lymphs Abs: 1.8 10*3/uL (ref 0.7–4.0)
MCH: 27.9 pg (ref 26.0–34.0)
MCHC: 31.1 g/dL (ref 30.0–36.0)
MCV: 89.7 fL (ref 80.0–100.0)
MONO ABS: 1.4 10*3/uL — AB (ref 0.1–1.0)
Monocytes Relative: 10 %
Neutro Abs: 9.2 10*3/uL — ABNORMAL HIGH (ref 1.7–7.7)
Neutrophils Relative %: 63 %
Platelets: 112 10*3/uL — ABNORMAL LOW (ref 150–400)
RBC: 3.48 MIL/uL — ABNORMAL LOW (ref 4.22–5.81)
RDW: 15.7 % — ABNORMAL HIGH (ref 11.5–15.5)
WBC: 14.3 10*3/uL — ABNORMAL HIGH (ref 4.0–10.5)
nRBC: 0.4 % — ABNORMAL HIGH (ref 0.0–0.2)

## 2018-12-08 LAB — SAMPLE TO BLOOD BANK

## 2018-12-08 NOTE — Telephone Encounter (Signed)
Printed avs and calender of upcoming appointment. Per 2/5 los 

## 2018-12-10 ENCOUNTER — Telehealth: Payer: Self-pay | Admitting: Gastroenterology

## 2018-12-10 NOTE — Telephone Encounter (Signed)
Spoke with Dr. Irene Limbo about this patient's situation. He did not feel endoscopic evaluation is warranted at this time given diffuse metastatic nature and on palliative chemo right now, will not change management.  Jan can you let the patient know change of plan. No plans for EGD and colonoscopy right now. Thanks

## 2018-12-11 ENCOUNTER — Other Ambulatory Visit: Payer: Self-pay

## 2018-12-11 ENCOUNTER — Inpatient Hospital Stay (HOSPITAL_COMMUNITY)
Admission: EM | Admit: 2018-12-11 | Discharge: 2018-12-11 | DRG: 603 | Discharge status: Against Medical Advice | Payer: Medicaid Other | Attending: Internal Medicine | Admitting: Internal Medicine

## 2018-12-11 ENCOUNTER — Encounter (HOSPITAL_COMMUNITY): Payer: Self-pay

## 2018-12-11 DIAGNOSIS — C787 Secondary malignant neoplasm of liver and intrahepatic bile duct: Secondary | ICD-10-CM | POA: Diagnosis present

## 2018-12-11 DIAGNOSIS — Z8249 Family history of ischemic heart disease and other diseases of the circulatory system: Secondary | ICD-10-CM | POA: Diagnosis not present

## 2018-12-11 DIAGNOSIS — Z8042 Family history of malignant neoplasm of prostate: Secondary | ICD-10-CM | POA: Diagnosis not present

## 2018-12-11 DIAGNOSIS — M109 Gout, unspecified: Secondary | ICD-10-CM | POA: Diagnosis present

## 2018-12-11 DIAGNOSIS — I10 Essential (primary) hypertension: Secondary | ICD-10-CM | POA: Diagnosis present

## 2018-12-11 DIAGNOSIS — L03116 Cellulitis of left lower limb: Secondary | ICD-10-CM | POA: Diagnosis not present

## 2018-12-11 DIAGNOSIS — Z79899 Other long term (current) drug therapy: Secondary | ICD-10-CM | POA: Diagnosis not present

## 2018-12-11 DIAGNOSIS — C801 Malignant (primary) neoplasm, unspecified: Secondary | ICD-10-CM | POA: Diagnosis present

## 2018-12-11 DIAGNOSIS — Z833 Family history of diabetes mellitus: Secondary | ICD-10-CM | POA: Diagnosis not present

## 2018-12-11 DIAGNOSIS — Z803 Family history of malignant neoplasm of breast: Secondary | ICD-10-CM

## 2018-12-11 DIAGNOSIS — Z8505 Personal history of malignant neoplasm of liver: Secondary | ICD-10-CM

## 2018-12-11 DIAGNOSIS — L0390 Cellulitis, unspecified: Secondary | ICD-10-CM | POA: Diagnosis present

## 2018-12-11 LAB — CBC WITH DIFFERENTIAL/PLATELET
Abs Immature Granulocytes: 7.26 10*3/uL — ABNORMAL HIGH (ref 0.00–0.07)
Basophils Absolute: 0.1 10*3/uL (ref 0.0–0.1)
Basophils Relative: 0 %
Eosinophils Absolute: 0 10*3/uL (ref 0.0–0.5)
Eosinophils Relative: 0 %
HCT: 31.1 % — ABNORMAL LOW (ref 39.0–52.0)
Hemoglobin: 9.5 g/dL — ABNORMAL LOW (ref 13.0–17.0)
Immature Granulocytes: 21 %
LYMPHS PCT: 7 %
Lymphs Abs: 2.4 10*3/uL (ref 0.7–4.0)
MCH: 27.9 pg (ref 26.0–34.0)
MCHC: 30.5 g/dL (ref 30.0–36.0)
MCV: 91.5 fL (ref 80.0–100.0)
Monocytes Absolute: 2.4 10*3/uL — ABNORMAL HIGH (ref 0.1–1.0)
Monocytes Relative: 7 %
Neutro Abs: 22.2 10*3/uL — ABNORMAL HIGH (ref 1.7–7.7)
Neutrophils Relative %: 65 %
Platelets: 157 10*3/uL (ref 150–400)
RBC: 3.4 MIL/uL — ABNORMAL LOW (ref 4.22–5.81)
RDW: 16.1 % — ABNORMAL HIGH (ref 11.5–15.5)
WBC: 34.4 10*3/uL — ABNORMAL HIGH (ref 4.0–10.5)
nRBC: 0.2 % (ref 0.0–0.2)

## 2018-12-11 LAB — BASIC METABOLIC PANEL
Anion gap: 8 (ref 5–15)
BUN: 11 mg/dL (ref 6–20)
CHLORIDE: 103 mmol/L (ref 98–111)
CO2: 24 mmol/L (ref 22–32)
Calcium: 7.9 mg/dL — ABNORMAL LOW (ref 8.9–10.3)
Creatinine, Ser: 0.57 mg/dL — ABNORMAL LOW (ref 0.61–1.24)
GFR calc Af Amer: >60 mL/min (ref >60)
GFR calc non Af Amer: >60 mL/min (ref >60)
Glucose, Bld: 126 mg/dL — ABNORMAL HIGH (ref 70–99)
Potassium: 3.9 mmol/L (ref 3.5–5.1)
Sodium: 135 mmol/L (ref 135–145)

## 2018-12-11 MED ORDER — HEPARIN SOD (PORK) LOCK FLUSH 100 UNIT/ML IV SOLN
INTRAVENOUS | Status: AC
  Administered 2018-12-11: 06:00:00
  Filled 2018-12-11: qty 5

## 2018-12-11 MED ORDER — CLINDAMYCIN PHOSPHATE 600 MG/50ML IV SOLN
600.0000 mg | Freq: Once | INTRAVENOUS | Status: AC
  Administered 2018-12-11: 600 mg via INTRAVENOUS
  Filled 2018-12-11: qty 50

## 2018-12-11 MED ORDER — CLINDAMYCIN HCL 300 MG PO CAPS: 300.0000 mg | ORAL_CAPSULE | Freq: Four times a day (QID) | ORAL | 0 refills | Status: DC

## 2018-12-11 NOTE — ED Triage Notes (Signed)
Pt reports L foot pain and swelling for the last several days. States that swelling is primarily on the top of his foot. Cancer pt

## 2018-12-11 NOTE — ED Notes (Signed)
ED TO INPATIENT HANDOFF REPORT  Name/Age/Gender [name] 58 y.o. male  Code Status Code Status History    Date Active Date Inactive Code Status Order ID Comments User Context   02/18/2012 0139 02/18/2012 2343 Full Code [id number]  Reche Dixon, RN Inpatient      Home/SNF/Other Home  Chief Complaint CA pt;left foot pain and swelling  Level of Care/Admitting Diagnosis ED Disposition    ED Disposition Condition [place] [facility] Area: [facility] [[id number]]  Level of Care: Med-Surg [16]  Diagnosis: Cellulitis [619509]  Admitting Physician: Rise Patience [[id number]]  Attending Physician: Rise Patience [phone]  Estimated length of stay: past midnight tomorrow  Certification:: I certify this patient will need inpatient services for at least 2 midnights  PT Class (Do Not Modify): Inpatient [101]  PT Acc Code (Do Not Modify): Private [1]       Medical History Past Medical History:  Diagnosis Date  . Arthritis    "knees; left hand"  . Cancer ([place])   . Gout   . Heart murmur    Longstanding. 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 Hypertension   . Near syncope    05/2011 with tachypalpitations & with echo showing mild LVH, normal EF 55-60%, trivial AR    Allergies No Known Allergies  IV Location/Drains/Wounds Patient Lines/Drains/Airways Status   Active Line/Drains/Airways    Name:   Placement date:   Placement time:   Site:   Days:   Implanted Port 10/26/18 Right Chest   10/26/18    0951    Chest   46          Labs/Imaging Results for orders placed or performed during the hospital encounter of 12/11/18 (from the past 48 hour(s))  Basic metabolic panel     Status: Abnormal   Collection Time: 12/11/18  2:54 AM  Result Value Ref Range   Sodium 135 135 - 145 mmol/L   Potassium 3.9 3.5 - 5.1 mmol/L   Chloride 103 98 - 111 mmol/L   CO2 24 22 - 32 mmol/L   Glucose, Bld 126 (H) 70 - 99 mg/dL   BUN  11 6 - 20 mg/dL   Creatinine, Ser 0.57 (L) 0.61 - 1.24 mg/dL   Calcium 7.9 (L) 8.9 - 10.3 mg/dL   GFR calc non Af Amer >60 >60 mL/min   GFR calc Af Amer >60 >60 mL/min   Anion gap 8 5 - 15    Comment: Performed at [facility], [place] [address] [place], [place] [address]  CBC with Differential     Status: Abnormal   Collection Time: 12/11/18  2:54 AM  Result Value Ref Range   WBC 34.4 (H) 4.0 - 10.5 K/uL   RBC 3.40 (L) 4.22 - 5.81 MIL/uL   Hemoglobin 9.5 (L) 13.0 - 17.0 g/dL   HCT 31.1 (L) 39.0 - 52.0 %   MCV 91.5 80.0 - 100.0 fL   MCH 27.9 26.0 - 34.0 pg   MCHC 30.5 30.0 - 36.0 g/dL   RDW 16.1 (H) 11.5 - 15.5 %   Platelets 157 150 - 400 K/uL   nRBC 0.2 0.0 - 0.2 %   Neutrophils Relative % 65 %   Neutro Abs 22.2 (H) 1.7 - 7.7 K/uL   Lymphocytes Relative 7 %   Lymphs Abs 2.4 0.7 - 4.0 K/uL   Monocytes Relative 7 %   Monocytes Absolute 2.4 (H) 0.1 -  1.0 K/uL   Eosinophils Relative 0 %   Eosinophils Absolute 0.0 0.0 - 0.5 K/uL   Basophils Relative 0 %   Basophils Absolute 0.1 0.0 - 0.1 K/uL   Immature Granulocytes 21 %   Abs Immature Granulocytes 7.26 (H) 0.00 - 0.07 K/uL    Comment: Performed at [facility], [place] [address], [place], [place] [address]   No results found.  Pending Labs Unresulted Labs (From admission, onward)   None      Vitals/Pain Today's Vitals   12/11/18 0046 12/11/18 0051 12/11/18 0300 12/11/18 0500  BP: (!) 174/81  (!) 170/85 (!) 150/78  Pulse: 75  85 69  Resp:   17 18  Temp: 99.2 F (37.3 C)     TempSrc: Oral     SpO2: 98%  99% 99%  PainSc:  10-Worst pain ever      Isolation Precautions No active isolations  Medications Medications  clindamycin (CLEOCIN) IVPB 600 mg (0 mg Intravenous Stopped 12/11/18 0326)    Mobility walks

## 2018-12-11 NOTE — ED Provider Notes (Addendum)
[facility] DEPT Provider Note   CSN: [id number] Arrival date & time: 12/11/18  0037     History   Chief Complaint  HPI [name] is a 58 y.o. male.  Patient is a 58 year old male with past medical history of liver cancer currently undergoing chemotherapy.  He presents today for a several day history of left foot, ankle, and leg pain.  This is worsened to the point he is having difficulty ambulating.  He denies any fevers or chills.  He does say that the leg feels hot.  He denies any specific injury or trauma.  The history is provided by the patient.    Past Medical History:  Diagnosis Date  . Arthritis    "knees; left hand"  . Cancer ([place])   . Gout   . Heart murmur    Longstanding. 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 Hypertension   . Near syncope    05/2011 with tachypalpitations & with echo showing mild LVH, normal EF 55-60%, trivial AR    Patient Active Problem List   Diagnosis Date Noted  . Port-A-Cath in place 11/15/2018  . Bone metastases ([place]) 10/20/2018  . 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 Counseling regarding advance care planning and goals of care 10/20/2018  . Gout 03/21/2016  . Chest pain 02/18/2012  . Alcohol abuse 02/18/2012  . Syncope 05/28/2011  . Palpitations 05/28/2011  . Murmur 05/28/2011    Past Surgical History:  Procedure Laterality Date  . IR IMAGING GUIDED PORT INSERTION  10/26/2018  . NO PAST SURGERIES          Home Medications    Prior to Admission medications   Medication Sig Start Date End Date Taking? Authorizing Provider  colchicine 0.6 MG tablet Take 1 tablet (0.6 mg total) by mouth See admin instructions. Take 1.2mg  (2 tablets) followed by .6mg  (1 tablet) 1 hour later. 03/27/18   Molpus, John, MD  furosemide (LASIX) 20 MG tablet Take 1 tablet (20 mg total) by mouth daily. 11/24/18   Brunetta Genera, MD    lidocaine-prilocaine (EMLA) cream Apply to affected area once 10/20/18   Brunetta Genera, MD  LORazepam (ATIVAN) 0.5 MG tablet Take 1 tablet (0.5 mg total) by mouth every 6 (six) hours as needed (Nausea or vomiting). 10/20/18   Brunetta Genera, MD  ondansetron (ZOFRAN ODT) 4 MG disintegrating tablet Take 1 tablet (4 mg total) by mouth every 8 (eight) hours as needed for nausea or vomiting. 10/01/18   Francine Graven, DO  oxyCODONE (OXY IR/ROXICODONE) 5 MG immediate release tablet Take 1-2 tablets (5-10 mg total) by mouth every 4 (four) hours as needed for severe pain. 11/24/18   Brunetta Genera, MD  potassium chloride SA (K-DUR,KLOR-CON) 20 MEQ tablet Take 1 tablet (20 mEq total) by mouth daily. With lasix 11/24/18   Brunetta Genera, MD  prochlorperazine (COMPAZINE) 10 MG tablet Take 1 tablet (10 mg total) by mouth every 6 (six) hours as needed (Nausea or vomiting). 10/20/18   Brunetta Genera, MD    Family History Family History  Problem Relation Age of Onset  . Cancer Mother        Possibly pancreatic. Died at age 63  . Prostate cancer Father   . Breast cancer Cousin   . Diabetes Paternal Uncle   . Heart disease Paternal Uncle     Social History Social History  Tobacco Use  . Smoking status: Never Smoker  . Smokeless tobacco: Never Used  Substance Use Topics  . Alcohol use: Not Currently    Comment: states he has cut back/ every now and again  . Drug use: No     Allergies   Patient has no known allergies.   Review of Systems Review of Systems  All other systems reviewed and are negative.    Physical Exam Updated Vital Signs BP (!) 174/81 (BP Location: Right Arm)   Pulse 75   Temp 99.2 F (37.3 C) (Oral)   SpO2 98%   Physical Exam Vitals signs and nursing note reviewed.  Constitutional:      General: He is not in acute distress.    Appearance: He is well-developed. He is not diaphoretic.  HENT:     Head: Normocephalic and atraumatic.   Neck:     Musculoskeletal: Normal range of motion and neck supple.  Cardiovascular:     Rate and Rhythm: Normal rate and regular rhythm.     Heart sounds: No murmur. No friction rub.  Pulmonary:     Effort: Pulmonary effort is normal. No respiratory distress.     Breath sounds: Normal breath sounds. No wheezing or rales.  Abdominal:     General: Bowel sounds are normal. There is no distension.     Palpations: Abdomen is soft.     Tenderness: There is no abdominal tenderness.  Musculoskeletal: Normal range of motion.     Comments: Both lower extremities have 1-2+ pitting edema.  The left lower extremity has redness, warmth, and tenderness in a stocking distribution.  DP pulses are palpable.    Skin:    General: Skin is warm and dry.  Neurological:     Mental Status: He is alert and oriented to person, place, and time.     Coordination: Coordination normal.      ED Treatments / Results  Labs (all labs ordered are listed, but only abnormal results are displayed) Labs Reviewed  BASIC METABOLIC PANEL  CBC WITH DIFFERENTIAL/PLATELET    EKG None  Radiology No results found.  Procedures Procedures (including critical care time)  Medications Ordered in ED Medications  clindamycin (CLEOCIN) IVPB 600 mg (has no administration in time range)     Initial Impression / Assessment and Plan / ED Course  I have reviewed the triage vital signs and the nursing notes.  Pertinent labs & imaging results that were available during my care of the patient were reviewed by me and considered in my medical decision making (see chart for details).  Patient presents here with complaints of left leg pain.  He has a significant cellulitis.  Laboratory studies show a white count of 34,000, however he does receive immunostimulant medications during his chemotherapy.  Patient was given clindamycin and I believe will require repeat doses of this.  I have discussed the care with Dr. Hal Hope who  agrees to admit.  Patient seen by the hospitalist who was informed by the patient he was refusing to stay in the hospital.  He will be treated with oral antibiotics and discharged to home at his request.  Patient tells me he will return if his symptoms are not improving or worsen.  Final Clinical Impressions(s) / ED Diagnoses   Final diagnoses:  None    ED Discharge Orders    None       Veryl Speak, MD 12/11/18 5956    Veryl Speak, MD 12/11/18 [phone]

## 2018-12-11 NOTE — Discharge Instructions (Addendum)
Clindamycin as prescribed.  Follow-up with your primary doctor in the next 2 to 3 days, and return to the ER if you develop worsening pain, swelling, high fever, or other new and concerning symptoms.

## 2018-12-13 ENCOUNTER — Telehealth: Payer: Self-pay

## 2018-12-13 NOTE — Telephone Encounter (Signed)
error 

## 2018-12-13 NOTE — Telephone Encounter (Signed)
Called pt and left message with recommendation from Dr. Havery Moros

## 2018-12-13 NOTE — Telephone Encounter (Signed)
Called and left message for pt that Dr. Irene Limbo and Dr. Loni Muse talked and due to his metastatic condition they agreed that an EGD/Colon is not advised at this time.

## 2018-12-14 ENCOUNTER — Telehealth: Payer: Self-pay

## 2018-12-14 NOTE — Telephone Encounter (Signed)
Lm for pt to call back

## 2018-12-14 NOTE — Progress Notes (Signed)
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  HISTORY OF PRESENTING ILLNESS:   [name] is a wonderful 58 y.o. male who has been referred to [place] by Dr. Francine Graven for evaluation and management of Liver masses. He is accompanied today by his wife and aunt. The pt reports that he is doing well overall.   The pt presented to the ED on 10/01/18 regarding persistent RUQ abdominal pain which began a month prior, and noted associations with nausea and 40 pound weight loss. He was evaluated with a CT A/P, as noted below, which revealed liver masses.   The pt reports that he first began feeling RUQ abdominal pain about 3-4 weeks ago, but began losing weight about 2 months ago. He notes that he began feeling more fatigue about 2 months ago as well.  The pt notes that the color of his bowels became dark black and denies taking Iron pills or bismuth medications. He notes that he began feeling constipated as well, feeling backed up. He notes that his stools began looking thinner in caliber. The pt notes that his appetite began decreasing over the last 3-4 weeks ago, and some of this he attributes to intermittent nausea. He denies problems swallowing food. He endorses RUQ pain, and denies vomiting and new cough, new bone pains, testicular pain or swelling, problems passing urine, blood in the urine. The pt has never had a colonoscopy and does not have a PCP, and cites his lack of health insurance as the reason.  The pt was released from the ED with Oxycodone and has been using this to treat his RUQ pain successfully.   The pt notes gout and a heart murmur as previous medical history. He notes that he hasn't consumed alcohol in the past 2 years whatsoever, but prior to this alcohol was  a problem for him.   Of note prior to the patient's visit today, pt has had a CT A/P completed on 10/01/18 with results revealing Interval development of numerous ill-defined hypodense masses throughout the entire liver compatible with metastatic disease. There is a 2.5 cm enhancing mass over the upper pole cortex of the right kidney suspicious for renal cell carcinoma. 2.  No acute findings in the abdomen/pelvis. 3. 1 cm hypodensity over the pancreatic tail not well seen on previous noncontrast exams. MR may be helpful for further evaluation on an elective basis.   Most recent lab results (10/06/18) of CBC w/diff and CMP is as follows: all values are WNL except for Monocytes abs at 1.2k, Sodium at 134, Albumin at 3.0, AST at 77, ALT at 63, Alk Phos at 392, Total Bilirubin at 2.3   On review of systems, pt reports new constipation, smaller caliber stools, dark stools, unexpected weight loss, RUQ pain, new fatigue, and denies vomiting, cough, new SOB, changes in breathing, new bone pains, leg swelling, blood in the urine, changes in urination, fevers, pain along the spine, and any other symptoms.   On PMHx the pt reports Gout, heart murmur, previous alcohol abuse. He denies any surgeries.  On Social Hx the pt reports that he hasn't consumed alcohol in the past 2 years whatsoever. He notes that alcohol abuse was previously a concern. He denies ever smoking cigarettes or doing drugs. He denies any radiation or chemical exposure.  On Family  Hx the pt reports mother with pancreatic cancer in 30s, maternal aunts with high blood pressure and heart failure, maternal aunt with breast cancer.  Interval History:  [name] returns today for management, evaluation, prior to C3 treatment of his newly diagnosed High grade neuroendocrine carcinoma. The patient's last visit with [place] was on 12/08/18. He is accompanied today by his wife and sister. The pt reports that he is doing well overall.   The pt reports that  he fell a couple days ago, and hit the back of his hand on a door knob, and also hurt his left elbow. He has swelling in his left hand which is also warm to the touch. He notes that he fell "because he lost his balance." He has been icing his left wrist as well.   The pt is also having left ankle pain, which is warm to the touch. He notes that he has had gout for several years.  Besides this the pt denies any new concerns. He denies leg swelling and has not been using his lasix.   Lab results today (12/15/18) of CBC w/diff and CMP is as follows: all values are WNL except for WBC at 25.6k, RBC at 3.68, HGB at 10.1, HCT at 32.5, RDW at 15.8, ANC at 18.1k, Monocytes abs at 1.9k, Basophils abs at 200, Abs immature granulocytes at 3.50k, Glucose at 176, Calcium at 8.0, Albumin at 3.2, AST at 13, Alk Phos at 166. 12/15/18 Magnesium at 2.0  On review of systems, pt reports left wrist pain, left elbow pain, left ankle pain, good energy levels, eating well, and denies and any other symptoms.  MEDICAL HISTORY:  Past Medical History:  Diagnosis Date  . Arthritis    "knees; left hand"  . Cancer ([place])   . Gout   . Heart murmur    Longstanding. 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 Hypertension   . Near syncope    05/2011 with tachypalpitations & with echo showing mild LVH, normal EF 55-60%, trivial AR    SURGICAL HISTORY: Past Surgical History:  Procedure Laterality Date  . IR IMAGING GUIDED PORT INSERTION  10/26/2018  . NO PAST SURGERIES      SOCIAL HISTORY: Social History   Socioeconomic History  . Marital status: Married    Spouse name: Not on file  . Number of children: 1  . Years of education: Not on file  . Highest education level: Not on file  Occupational History  . Occupation: Unemployed  Social Needs  . Financial resource strain: Not on file  . Food insecurity:    Worry: Not on file    Inability: Not on file  . Transportation needs:    Medical: Not  on file    Non-medical: Not on file  Tobacco Use  . Smoking status: Never Smoker  . Smokeless tobacco: Never Used  Substance and Sexual Activity  . Alcohol use: Not Currently    Comment: states he has cut back/ every now and again  . Drug use: No  . Sexual activity: Yes    Partners: Female  Lifestyle  . Physical activity:    Days per week: Not on file    Minutes per session: Not on file  . Stress: Not on file  Relationships  . Social connections:    Talks on phone: Not on file    Gets together: Not on file    Attends religious service: Not on file  Active member of club or organization: Not on file    Attends meetings of clubs or organizations: Not on file    Relationship status: Not on file  . Intimate partner violence:    Fear of current or ex partner: Not on file    Emotionally abused: Not on file    Physically abused: Not on file    Forced sexual activity: Not on file  Other Topics Concern  . Not on file  Social History Narrative   Married. 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 Is from [place].     FAMILY HISTORY: Family History  Problem Relation Age of Onset  . Cancer Mother        Possibly pancreatic. Died at age 22  . Prostate cancer Father   . Breast cancer Cousin   . Diabetes Paternal Uncle   . Heart disease Paternal Uncle     ALLERGIES:  has No Known Allergies.  MEDICATIONS:  Current Outpatient Medications  Medication Sig Dispense Refill  . clindamycin (CLEOCIN) 300 MG capsule Take 1 capsule (300 mg total) by mouth 4 (four) times daily. X 7 days 28 capsule 0  . colchicine 0.6 MG tablet Take 1 tablet (0.6 mg total) by mouth See admin instructions. Take 1.31m (2 tablets) followed by .632m(1 tablet) 1 hour later. (Patient not taking: Reported on 12/11/2018) 3 tablet 0  . furosemide (LASIX) 20 MG tablet Take 1 tablet (20 mg total) by mouth daily. 30 tablet 1  . lidocaine-prilocaine (EMLA) cream Apply to  affected area once (Patient not taking: Reported on 12/11/2018) 30 g 3  . LORazepam (ATIVAN) 0.5 MG tablet Take 1 tablet (0.5 mg total) by mouth every 6 (six) hours as needed (Nausea or vomiting). 30 tablet 0  . ondansetron (ZOFRAN ODT) 4 MG disintegrating tablet Take 1 tablet (4 mg total) by mouth every 8 (eight) hours as needed for nausea or vomiting. 6 tablet 0  . oxyCODONE (OXY IR/ROXICODONE) 5 MG immediate release tablet Take 1-2 tablets (5-10 mg total) by mouth every 4 (four) hours as needed for severe pain. 90 tablet 0  . potassium chloride SA (K-DUR,KLOR-CON) 20 MEQ tablet Take 1 tablet (20 mEq total) by mouth daily. With lasix 30 tablet 1  . prochlorperazine (COMPAZINE) 10 MG tablet Take 1 tablet (10 mg total) by mouth every 6 (six) hours as needed (Nausea or vomiting). 30 tablet 1   No current facility-administered medications for this visit.     REVIEW OF SYSTEMS:    A 10+ POINT REVIEW OF SYSTEMS WAS OBTAINED including neurology, dermatology, psychiatry, cardiac, respiratory, lymph, extremities, GI, GU, Musculoskeletal, constitutional, breasts, reproductive, HEENT.  All pertinent positives are noted in the HPI.  All others are negative.   PHYSICAL EXAMINATION: ECOG PERFORMANCE STATUS: 1 - Symptomatic but completely ambulatory  Vitals:   12/15/18 0942  BP: (!) 183/78  Pulse: 83  Resp: 18  Temp: 98.3 F (36.8 C)  SpO2: 100%   Filed Weights   12/15/18 0942  Weight: 152 lb 4.8 oz (69.1 kg)   .Body mass index is 23.85 kg/m.  GENERAL:alert, in no acute distress and comfortable SKIN: no acute rashes, no significant lesions EYES: conjunctiva are pink and non-injected, sclera anicteric OROPHARYNX: MMM, no exudates, no oropharyngeal erythema or ulceration NECK: supple, no JVD LYMPH:  no palpable lymphadenopathy in the cervical, axillary or inguinal regions LUNGS: clear to auscultation b/l with normal respiratory effort HEART: regular rate & rhythm ABDOMEN:  normoactive bowel  sounds , tender over the RUQ, not distended. Improving hepatomegaly, now palpable 3cm under right mid-costal line.  Extremity: no pedal edema, left wrist, elbow and ankle tender to palpation  PSYCH: alert & oriented x 3 with fluent speech NEURO: no focal motor/sensory deficits   LABORATORY DATA:  I have reviewed the data as listed  . CBC Latest Ref Rng & Units 12/15/2018 12/11/2018 12/08/2018  WBC 4.0 - 10.5 K/uL 25.6(H) 34.4(H) 14.3(H)  Hemoglobin 13.0 - 17.0 g/dL 10.1(L) 9.5(L) 9.7(L)  Hematocrit 39.0 - 52.0 % 32.5(L) 31.1(L) 31.2(L)  Platelets 150 - 400 K/uL 209 157 112(L)    . CMP Latest Ref Rng & Units 12/15/2018 12/11/2018 11/23/2018  Glucose 70 - 99 mg/dL 176(H) 126(H) 108(H)  BUN 6 - 20 mg/dL '9 11 8  ' Creatinine 0.61 - 1.24 mg/dL 0.77 0.57(L) 0.69  Sodium 135 - 145 mmol/L 135 135 139  Potassium 3.5 - 5.1 mmol/L 4.2 3.9 3.8  Chloride 98 - 111 mmol/L 102 103 108  CO2 22 - 32 mmol/L '25 24 25  ' Calcium 8.9 - 10.3 mg/dL 8.0(L) 7.9(L) 7.5(L)  Total Protein 6.5 - 8.1 g/dL 7.6 - 6.3(L)  Total Bilirubin 0.3 - 1.2 mg/dL 0.7 - 0.6  Alkaline Phos 38 - 126 U/L 166(H) - 298(H)  AST 15 - 41 U/L 13(L) - 17  ALT 0 - 44 U/L 6 - 11    10/08/18 Liver Biopsy:    RADIOGRAPHIC STUDIES: I have personally reviewed the radiological images as listed and agreed with the findings in the report. No results found.  ASSESSMENT & PLAN:  58 y.o. male with  1. 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 There is a 2.5 cm enhancing mass over the upper pole cortex of the right kidney suspicious for renal cell carcinoma. 2.  No acute findings in the abdomen/pelvis. 3. 1 cm hypodensity over the pancreatic tail not well seen on previous noncontrast exams. MR may be helpful for further evaluation on an elective basis.    10/06/18 Hep B  and Hep C were negative   10/06/18 CA19-9 elevated at 652, AFP slightly elevated at 8.8, CEA normal at 2.13, LDH slightly elevated at 215  10/08/18 US Liver Biopsy which revealed High Grade Neuroendocrine Carcinoma  10/15/18 PET/CT revealed Diffuse hepatic and osseous metastatic disease without obvious primary neoplasm. 2. Upper pole right renal lesion is not definitely hypermetabolic and has been present since 2012. I think is unlikely the cause of the metastatic disease. Recommend liver biopsy for tissue diagnosis. If needed, an MRI of the abdomen without and with contrast may be helpful for further evaluation of the renal lesion. 3. Two sub 3 mm right upper lobe pulmonary nodules are indeterminate.   10/26/18 MRI Brain revealed No acute abnormality and negative for metastatic disease.  2. Abnormal LFTs - likely from liver metastases  Labs upon initial presentation from 10/06/18, some borderline monocytosis at 1.2k, otherwise normal blood counts, AST at 77, ALT at 63, Alk Phos at 392, Total Bilirubin at 2.3   3. Rt renal mass - concerning for RCC  PLAN:  -Discussed pt labwork today, 12/15/18; liver functions improved, other blood counts and chemistries are stable, Magnesium stable at 2.0 -The pt has no prohibitive toxicities from continuing C3 Carboplatin and 20% reduced Etoposide at this time.   -XR of left elbow and wrist, concerned  for left wrist fracture. - reviewed - no fractures. -Keep left wrist elevated -Concerned that trauma from fall has induced a gout attack -Short course of Prednisone for one week -Will repeat scans after C4 -Regarding the patient's C1 thrombocytopenia, did decrease dose of Etoposide by 20% for C2 treatment -Continue with compression socks and leg elevation  -Lasix as needed, with Potassium replacement  -Continue high protein and high calorie meal supplements such as Boost or Ensure -Plan for Carboplatin and Etoposide every 3 weeks, with G-CSF support for up to  6 cycles -Strongly recommended that the pt not lift, push, or pull anything to prevent the risk of pathologic fractures as the PET/CT does indicate bone involvement -Continue Xgeva every 4 weeks -Oxycodone for pain mx, up to every 6 hours -Recommended that the pt continue to eat well, drink at least 48-64 oz of water each day, and walk 20-30 minutes each day -Will see the pt back in 10-12 days   X ray left wrist/hand and left elbow today RTC with Dr Irene Limbo with labs in 10-12 days Please schedule C4 of chemotherapy as ordered with labs and MD visit    All of the patients questions were answered with apparent satisfaction. The patient knows to call the clinic with any problems, questions or concerns.  The total time spent in the appt was 30 minutes and more than 50% was on counseling and direct patient cares.     Sullivan Lone MD MS AAHIVMS [facility] Hematology/Oncology Physician [facility]  (Office):       [phone] (Work cell):  [phone] (Fax):           [phone]  12/15/2018 10:43 AM  I, Baldwin Jamaica, am acting as a scribe for Dr. Sullivan Lone.   .I have reviewed the above documentation for accuracy and completeness, and I agree with the above. Brunetta Genera MD

## 2018-12-15 ENCOUNTER — Inpatient Hospital Stay: Payer: Medicaid Other

## 2018-12-15 ENCOUNTER — Inpatient Hospital Stay (HOSPITAL_BASED_OUTPATIENT_CLINIC_OR_DEPARTMENT_OTHER): Payer: Medicaid Other | Admitting: Hematology

## 2018-12-15 ENCOUNTER — Ambulatory Visit (HOSPITAL_COMMUNITY)
Admission: RE | Admit: 2018-12-15 | Discharge: 2018-12-15 | Disposition: A | Discharge status: Home / Self Care | Payer: Medicaid Other | Source: Ambulatory Visit | Attending: Hematology | Admitting: Hematology

## 2018-12-15 ENCOUNTER — Telehealth: Payer: Self-pay

## 2018-12-15 VITALS — BP 183/78 | HR 83 | Temp 98.3°F | Resp 18 | Ht 67.0 in | Wt 152.3 lb

## 2018-12-15 DIAGNOSIS — R945 Abnormal results of liver function studies: Secondary | ICD-10-CM

## 2018-12-15 DIAGNOSIS — M25522 Pain in left elbow: Secondary | ICD-10-CM | POA: Diagnosis present

## 2018-12-15 DIAGNOSIS — C7951 Secondary malignant neoplasm of bone: Secondary | ICD-10-CM

## 2018-12-15 DIAGNOSIS — C787 Secondary malignant neoplasm of liver and intrahepatic bile duct: Secondary | ICD-10-CM | POA: Insufficient documentation

## 2018-12-15 DIAGNOSIS — Z5111 Encounter for antineoplastic chemotherapy: Secondary | ICD-10-CM | POA: Diagnosis present

## 2018-12-15 DIAGNOSIS — N2889 Other specified disorders of kidney and ureter: Secondary | ICD-10-CM | POA: Diagnosis not present

## 2018-12-15 DIAGNOSIS — Z79899 Other long term (current) drug therapy: Secondary | ICD-10-CM | POA: Diagnosis not present

## 2018-12-15 DIAGNOSIS — M25532 Pain in left wrist: Secondary | ICD-10-CM | POA: Diagnosis present

## 2018-12-15 DIAGNOSIS — Z7189 Other specified counseling: Secondary | ICD-10-CM

## 2018-12-15 DIAGNOSIS — M109 Gout, unspecified: Secondary | ICD-10-CM

## 2018-12-15 DIAGNOSIS — D72821 Monocytosis (symptomatic): Secondary | ICD-10-CM

## 2018-12-15 DIAGNOSIS — C801 Malignant (primary) neoplasm, unspecified: Principal | ICD-10-CM

## 2018-12-15 DIAGNOSIS — R918 Other nonspecific abnormal finding of lung field: Secondary | ICD-10-CM

## 2018-12-15 DIAGNOSIS — M25432 Effusion, left wrist: Secondary | ICD-10-CM

## 2018-12-15 DIAGNOSIS — C7B02 Secondary carcinoid tumors of liver: Secondary | ICD-10-CM

## 2018-12-15 DIAGNOSIS — D696 Thrombocytopenia, unspecified: Secondary | ICD-10-CM

## 2018-12-15 DIAGNOSIS — C7A1 Malignant poorly differentiated neuroendocrine tumors: Secondary | ICD-10-CM

## 2018-12-15 DIAGNOSIS — Z95828 Presence of other vascular implants and grafts: Secondary | ICD-10-CM

## 2018-12-15 DIAGNOSIS — M25422 Effusion, left elbow: Secondary | ICD-10-CM

## 2018-12-15 LAB — CBC WITH DIFFERENTIAL/PLATELET
Abs Immature Granulocytes: 3.5 10*3/uL — ABNORMAL HIGH (ref 0.00–0.07)
BASOS PCT: 1 %
Basophils Absolute: 0.2 10*3/uL — ABNORMAL HIGH (ref 0.0–0.1)
Eosinophils Absolute: 0.1 10*3/uL (ref 0.0–0.5)
Eosinophils Relative: 0 %
HCT: 32.5 % — ABNORMAL LOW (ref 39.0–52.0)
Hemoglobin: 10.1 g/dL — ABNORMAL LOW (ref 13.0–17.0)
Immature Granulocytes: 14 %
Lymphocytes Relative: 7 %
Lymphs Abs: 1.9 10*3/uL (ref 0.7–4.0)
MCH: 27.4 pg (ref 26.0–34.0)
MCHC: 31.1 g/dL (ref 30.0–36.0)
MCV: 88.3 fL (ref 80.0–100.0)
MONOS PCT: 7 %
Monocytes Absolute: 1.9 10*3/uL — ABNORMAL HIGH (ref 0.1–1.0)
Neutro Abs: 18.1 10*3/uL — ABNORMAL HIGH (ref 1.7–7.7)
Neutrophils Relative %: 71 %
PLATELETS: 209 10*3/uL (ref 150–400)
RBC: 3.68 MIL/uL — ABNORMAL LOW (ref 4.22–5.81)
RDW: 15.8 % — ABNORMAL HIGH (ref 11.5–15.5)
WBC: 25.6 10*3/uL — ABNORMAL HIGH (ref 4.0–10.5)
nRBC: 0.2 % (ref 0.0–0.2)

## 2018-12-15 LAB — CMP (CANCER CENTER ONLY)
ALT: 6 U/L (ref 0–44)
AST: 13 U/L — ABNORMAL LOW (ref 15–41)
Albumin: 3.2 g/dL — ABNORMAL LOW (ref 3.5–5.0)
Alkaline Phosphatase: 166 U/L — ABNORMAL HIGH (ref 38–126)
Anion gap: 8 (ref 5–15)
BUN: 9 mg/dL (ref 6–20)
CO2: 25 mmol/L (ref 22–32)
Calcium: 8 mg/dL — ABNORMAL LOW (ref 8.9–10.3)
Chloride: 102 mmol/L (ref 98–111)
Creatinine: 0.77 mg/dL (ref 0.61–1.24)
GFR, Estimated: >60 mL/min (ref >60)
Glucose, Bld: 176 mg/dL — ABNORMAL HIGH (ref 70–99)
Potassium: 4.2 mmol/L (ref 3.5–5.1)
Sodium: 135 mmol/L (ref 135–145)
Total Bilirubin: 0.7 mg/dL (ref 0.3–1.2)
Total Protein: 7.6 g/dL (ref 6.5–8.1)

## 2018-12-15 LAB — SAMPLE TO BLOOD BANK

## 2018-12-15 LAB — MAGNESIUM: MAGNESIUM: 2 mg/dL (ref 1.7–2.4)

## 2018-12-15 MED ORDER — SODIUM CHLORIDE 0.9% FLUSH
10.0000 mL | INTRAVENOUS | Status: DC | PRN
  Administered 2018-12-15: 10 mL
  Filled 2018-12-15: qty 10

## 2018-12-15 MED ORDER — SODIUM CHLORIDE 0.9 % IV SOLN
500.0000 mg | Freq: Once | INTRAVENOUS | Status: AC
  Administered 2018-12-15: 500 mg via INTRAVENOUS
  Filled 2018-12-15: qty 50

## 2018-12-15 MED ORDER — SODIUM CHLORIDE 0.9 % IV SOLN
Freq: Once | INTRAVENOUS | Status: AC
  Administered 2018-12-15: 11:00:00 via INTRAVENOUS
  Filled 2018-12-15: qty 250

## 2018-12-15 MED ORDER — PALONOSETRON HCL INJECTION 0.25 MG/5ML
0.2500 mg | Freq: Once | INTRAVENOUS | Status: AC
  Administered 2018-12-15: 0.25 mg via INTRAVENOUS

## 2018-12-15 MED ORDER — FAMOTIDINE IN NACL 20-0.9 MG/50ML-% IV SOLN
INTRAVENOUS | Status: AC
  Filled 2018-12-15: qty 50

## 2018-12-15 MED ORDER — HEPARIN SOD (PORK) LOCK FLUSH 100 UNIT/ML IV SOLN
500.0000 [IU] | Freq: Once | INTRAVENOUS | Status: AC | PRN
  Administered 2018-12-15: 500 [IU]
  Filled 2018-12-15: qty 5

## 2018-12-15 MED ORDER — FAMOTIDINE IN NACL 20-0.9 MG/50ML-% IV SOLN
20.0000 mg | Freq: Once | INTRAVENOUS | Status: AC
  Administered 2018-12-15: 20 mg via INTRAVENOUS

## 2018-12-15 MED ORDER — DENOSUMAB 120 MG/1.7ML ~~LOC~~ SOLN
120.0000 mg | Freq: Once | SUBCUTANEOUS | Status: AC
  Administered 2018-12-15: 120 mg via SUBCUTANEOUS

## 2018-12-15 MED ORDER — SODIUM CHLORIDE 0.9 % IV SOLN
Freq: Once | INTRAVENOUS | Status: AC
  Administered 2018-12-15: 12:00:00 via INTRAVENOUS
  Filled 2018-12-15: qty 5

## 2018-12-15 MED ORDER — ACETAMINOPHEN 325 MG PO TABS
650.0000 mg | ORAL_TABLET | Freq: Once | ORAL | Status: AC
  Administered 2018-12-15: 650 mg via ORAL

## 2018-12-15 MED ORDER — PALONOSETRON HCL INJECTION 0.25 MG/5ML
INTRAVENOUS | Status: AC
  Filled 2018-12-15: qty 5

## 2018-12-15 MED ORDER — SODIUM CHLORIDE 0.9 % IV SOLN
80.0000 mg/m2 | Freq: Once | INTRAVENOUS | Status: AC
  Administered 2018-12-15: 150 mg via INTRAVENOUS
  Filled 2018-12-15: qty 7.5

## 2018-12-15 MED ORDER — OXYCODONE HCL 5 MG PO TABS: 5.0000 mg | ORAL_TABLET | ORAL | 0 refills | Status: DC | PRN

## 2018-12-15 MED ORDER — DIPHENHYDRAMINE HCL 25 MG PO CAPS
25.0000 mg | ORAL_CAPSULE | Freq: Once | ORAL | Status: AC
  Administered 2018-12-15: 25 mg via ORAL

## 2018-12-15 MED ORDER — ACETAMINOPHEN 325 MG PO TABS
ORAL_TABLET | ORAL | Status: AC
  Filled 2018-12-15: qty 2

## 2018-12-15 MED ORDER — DIPHENHYDRAMINE HCL 25 MG PO CAPS
ORAL_CAPSULE | ORAL | Status: AC
  Filled 2018-12-15: qty 1

## 2018-12-15 MED ORDER — PREDNISONE 20 MG PO TABS: 40.0000 mg | ORAL_TABLET | Freq: Every day | ORAL | 0 refills | Status: AC

## 2018-12-15 MED ORDER — DENOSUMAB 120 MG/1.7ML ~~LOC~~ SOLN
SUBCUTANEOUS | Status: AC
  Filled 2018-12-15: qty 1.7

## 2018-12-15 NOTE — Telephone Encounter (Signed)
Printed avs and calender of upcoming appointment. Per 2/12 los

## 2018-12-15 NOTE — Patient Instructions (Signed)
[facility] Discharge Instructions for Patients Receiving Chemotherapy  Today you received the following chemotherapy agent: etoposide and carboplatin  To help prevent nausea and vomiting after your treatment, we encourage you to take your nausea medication as directed   If you develop nausea and vomiting that is not controlled by your nausea medication, call the clinic.   BELOW ARE SYMPTOMS THAT SHOULD BE REPORTED IMMEDIATELY:  *FEVER GREATER THAN 100.5 F  *CHILLS WITH OR WITHOUT FEVER  NAUSEA AND VOMITING THAT IS NOT CONTROLLED WITH YOUR NAUSEA MEDICATION  *UNUSUAL SHORTNESS OF BREATH  *UNUSUAL BRUISING OR BLEEDING  TENDERNESS IN MOUTH AND THROAT WITH OR WITHOUT PRESENCE OF ULCERS  *URINARY PROBLEMS  *BOWEL PROBLEMS  UNUSUAL RASH Items with * indicate a potential emergency and should be followed up as soon as possible.  Feel free to call the clinic should you have any questions or concerns. The clinic phone number is [phone].  Please show the [place] at check-in to the Emergency Department and triage nurse.

## 2018-12-15 NOTE — Progress Notes (Signed)
Spoke w/ Dr. Irene Limbo - pt had some SOB/rxn w/ C2 carboplatin infusion, received verbal order to add diphenhydramine, famotidine, and APAP to premedications.   Also, CorCa ~8.3 today - give Xgeva today per MD. Pt needs to start taking calcium supplements (600mg  PO BID).  Demetrius Charity, PharmD, [place] Oncology Pharmacist Pharmacy Phone: [phone] 12/15/2018

## 2018-12-15 NOTE — Telephone Encounter (Signed)
Sent letter to pt advising him that Dr. Havery Moros and Dr. Irene Limbo spoke and have concluded that due to his current medical condition, endoscopic procedures are not advised.

## 2018-12-16 ENCOUNTER — Inpatient Hospital Stay: Payer: Medicaid Other

## 2018-12-16 VITALS — BP 139/71 | HR 65 | Temp 97.9°F | Resp 20

## 2018-12-16 DIAGNOSIS — Z7189 Other specified counseling: Secondary | ICD-10-CM

## 2018-12-16 DIAGNOSIS — C801 Malignant (primary) neoplasm, unspecified: Principal | ICD-10-CM

## 2018-12-16 DIAGNOSIS — C787 Secondary malignant neoplasm of liver and intrahepatic bile duct: Secondary | ICD-10-CM

## 2018-12-16 DIAGNOSIS — Z5111 Encounter for antineoplastic chemotherapy: Secondary | ICD-10-CM | POA: Diagnosis not present

## 2018-12-16 MED ORDER — HEPARIN SOD (PORK) LOCK FLUSH 100 UNIT/ML IV SOLN
500.0000 [IU] | Freq: Once | INTRAVENOUS | Status: AC | PRN
  Administered 2018-12-16: 500 [IU]
  Filled 2018-12-16: qty 5

## 2018-12-16 MED ORDER — SODIUM CHLORIDE 0.9 % IV SOLN
Freq: Once | INTRAVENOUS | Status: AC
  Administered 2018-12-16: 09:00:00 via INTRAVENOUS
  Filled 2018-12-16: qty 250

## 2018-12-16 MED ORDER — SODIUM CHLORIDE 0.9% FLUSH
10.0000 mL | INTRAVENOUS | Status: DC | PRN
  Administered 2018-12-16: 10 mL
  Filled 2018-12-16: qty 10

## 2018-12-16 MED ORDER — DEXAMETHASONE SODIUM PHOSPHATE 10 MG/ML IJ SOLN
10.0000 mg | Freq: Once | INTRAMUSCULAR | Status: AC
  Administered 2018-12-16: 10 mg via INTRAVENOUS

## 2018-12-16 MED ORDER — SODIUM CHLORIDE 0.9 % IV SOLN
80.0000 mg/m2 | Freq: Once | INTRAVENOUS | Status: AC
  Administered 2018-12-16: 150 mg via INTRAVENOUS
  Filled 2018-12-16: qty 7.5

## 2018-12-16 MED ORDER — DEXAMETHASONE SODIUM PHOSPHATE 10 MG/ML IJ SOLN
INTRAMUSCULAR | Status: AC
  Filled 2018-12-16: qty 1

## 2018-12-16 NOTE — Patient Instructions (Signed)
[facility] Discharge Instructions for Patients Receiving Chemotherapy  Today you received the following chemotherapy agents:  Etoposide  To help prevent nausea and vomiting after your treatment, we encourage you to take your nausea medication as prescibed   If you develop nausea and vomiting that is not controlled by your nausea medication, call the clinic.   BELOW ARE SYMPTOMS THAT SHOULD BE REPORTED IMMEDIATELY:  *FEVER GREATER THAN 100.5 F  *CHILLS WITH OR WITHOUT FEVER  NAUSEA AND VOMITING THAT IS NOT CONTROLLED WITH YOUR NAUSEA MEDICATION  *UNUSUAL SHORTNESS OF BREATH  *UNUSUAL BRUISING OR BLEEDING  TENDERNESS IN MOUTH AND THROAT WITH OR WITHOUT PRESENCE OF ULCERS  *URINARY PROBLEMS  *BOWEL PROBLEMS  UNUSUAL RASH Items with * indicate a potential emergency and should be followed up as soon as possible.  Feel free to call the clinic should you have any questions or concerns. The clinic phone number is [phone].  Please show the [place] at check-in to the Emergency Department and triage nurse.

## 2018-12-17 ENCOUNTER — Inpatient Hospital Stay: Payer: Medicaid Other

## 2018-12-17 VITALS — BP 146/65 | HR 61 | Temp 97.6°F | Resp 18

## 2018-12-17 DIAGNOSIS — C787 Secondary malignant neoplasm of liver and intrahepatic bile duct: Secondary | ICD-10-CM

## 2018-12-17 DIAGNOSIS — C801 Malignant (primary) neoplasm, unspecified: Principal | ICD-10-CM

## 2018-12-17 DIAGNOSIS — Z5111 Encounter for antineoplastic chemotherapy: Secondary | ICD-10-CM | POA: Diagnosis not present

## 2018-12-17 DIAGNOSIS — Z7189 Other specified counseling: Secondary | ICD-10-CM

## 2018-12-17 MED ORDER — SODIUM CHLORIDE 0.9 % IV SOLN
Freq: Once | INTRAVENOUS | Status: AC
  Administered 2018-12-17: 09:00:00 via INTRAVENOUS
  Filled 2018-12-17: qty 250

## 2018-12-17 MED ORDER — DEXAMETHASONE SODIUM PHOSPHATE 10 MG/ML IJ SOLN
INTRAMUSCULAR | Status: AC
  Filled 2018-12-17: qty 1

## 2018-12-17 MED ORDER — DEXAMETHASONE SODIUM PHOSPHATE 10 MG/ML IJ SOLN
10.0000 mg | Freq: Once | INTRAMUSCULAR | Status: AC
  Administered 2018-12-17: 10 mg via INTRAVENOUS

## 2018-12-17 MED ORDER — HEPARIN SOD (PORK) LOCK FLUSH 100 UNIT/ML IV SOLN
500.0000 [IU] | Freq: Once | INTRAVENOUS | Status: AC | PRN
  Administered 2018-12-17: 500 [IU]
  Filled 2018-12-17: qty 5

## 2018-12-17 MED ORDER — SODIUM CHLORIDE 0.9% FLUSH
10.0000 mL | INTRAVENOUS | Status: DC | PRN
  Administered 2018-12-17: 10 mL
  Filled 2018-12-17: qty 10

## 2018-12-17 MED ORDER — SODIUM CHLORIDE 0.9 % IV SOLN
80.0000 mg/m2 | Freq: Once | INTRAVENOUS | Status: AC
  Administered 2018-12-17: 150 mg via INTRAVENOUS
  Filled 2018-12-17: qty 7.5

## 2018-12-17 NOTE — Patient Instructions (Signed)
[facility] Discharge Instructions for Patients Receiving Chemotherapy  Today you received the following chemotherapy agents:  Etoposide  To help prevent nausea and vomiting after your treatment, we encourage you to take your nausea medication as prescibed   If you develop nausea and vomiting that is not controlled by your nausea medication, call the clinic.   BELOW ARE SYMPTOMS THAT SHOULD BE REPORTED IMMEDIATELY:  *FEVER GREATER THAN 100.5 F  *CHILLS WITH OR WITHOUT FEVER  NAUSEA AND VOMITING THAT IS NOT CONTROLLED WITH YOUR NAUSEA MEDICATION  *UNUSUAL SHORTNESS OF BREATH  *UNUSUAL BRUISING OR BLEEDING  TENDERNESS IN MOUTH AND THROAT WITH OR WITHOUT PRESENCE OF ULCERS  *URINARY PROBLEMS  *BOWEL PROBLEMS  UNUSUAL RASH Items with * indicate a potential emergency and should be followed up as soon as possible.  Feel free to call the clinic should you have any questions or concerns. The clinic phone number is [phone].  Please show the [place] at check-in to the Emergency Department and triage nurse.

## 2018-12-18 ENCOUNTER — Inpatient Hospital Stay: Payer: Medicaid Other

## 2018-12-18 VITALS — BP 139/63 | HR 60 | Temp 98.0°F | Resp 19

## 2018-12-18 DIAGNOSIS — Z5111 Encounter for antineoplastic chemotherapy: Secondary | ICD-10-CM | POA: Diagnosis not present

## 2018-12-18 MED ORDER — PEGFILGRASTIM INJECTION 6 MG/0.6ML ~~LOC~~
PREFILLED_SYRINGE | SUBCUTANEOUS | Status: AC
  Filled 2018-12-18: qty 0.6

## 2018-12-18 MED ORDER — PEGFILGRASTIM INJECTION 6 MG/0.6ML ~~LOC~~
6.0000 mg | PREFILLED_SYRINGE | Freq: Once | SUBCUTANEOUS | Status: AC
  Administered 2018-12-18: 6 mg via SUBCUTANEOUS

## 2018-12-18 NOTE — Patient Instructions (Signed)
Pegfilgrastim injection  What is this medicine?  PEGFILGRASTIM (PEG fil gra stim) is a long-acting granulocyte colony-stimulating factor that stimulates the growth of neutrophils, a type of white blood cell important in the body's fight against infection. It is used to reduce the incidence of fever and infection in patients with certain types of cancer who are receiving chemotherapy that affects the bone marrow, and to increase survival after being exposed to high doses of radiation.  This medicine may be used for other purposes; ask your health care provider or pharmacist if you have questions.  COMMON BRAND NAME(S): Fulphila, Neulasta, UDENYCA  What should I tell my health care provider before I take this medicine?  They need to know if you have any of these conditions:  -kidney disease  -latex allergy  -ongoing radiation therapy  -sickle cell disease  -skin reactions to acrylic adhesives (On-Body Injector only)  -an unusual or allergic reaction to pegfilgrastim, filgrastim, other medicines, foods, dyes, or preservatives  -pregnant or trying to get pregnant  -breast-feeding  How should I use this medicine?  This medicine is for injection under the skin. If you get this medicine at home, you will be taught how to prepare and give the pre-filled syringe or how to use the On-body Injector. Refer to the patient Instructions for Use for detailed instructions. Use exactly as directed. Tell your healthcare provider immediately if you suspect that the On-body Injector may not have performed as intended or if you suspect the use of the On-body Injector resulted in a missed or partial dose.  It is important that you put your used needles and syringes in a special sharps container. Do not put them in a trash can. If you do not have a sharps container, call your pharmacist or healthcare provider to get one.  Talk to your pediatrician regarding the use of this medicine in children. While this drug may be prescribed for  selected conditions, precautions do apply.  Overdosage: If you think you have taken too much of this medicine contact a poison control center or emergency room at once.  NOTE: This medicine is only for you. Do not share this medicine with others.  What if I miss a dose?  It is important not to miss your dose. Call your doctor or health care professional if you miss your dose. If you miss a dose due to an On-body Injector failure or leakage, a new dose should be administered as soon as possible using a single prefilled syringe for manual use.  What may interact with this medicine?  Interactions have not been studied.  Give your health care provider a list of all the medicines, herbs, non-prescription drugs, or dietary supplements you use. Also tell them if you smoke, drink alcohol, or use illegal drugs. Some items may interact with your medicine.  This list may not describe all possible interactions. Give your health care provider a list of all the medicines, herbs, non-prescription drugs, or dietary supplements you use. Also tell them if you smoke, drink alcohol, or use illegal drugs. Some items may interact with your medicine.  What should I watch for while using this medicine?  You may need blood work done while you are taking this medicine.  If you are going to need a MRI, CT scan, or other procedure, tell your doctor that you are using this medicine (On-Body Injector only).  What side effects may I notice from receiving this medicine?  Side effects that you should report to   your doctor or health care professional as soon as possible:  -allergic reactions like skin rash, itching or hives, swelling of the face, lips, or tongue  -back pain  -dizziness  -fever  -pain, redness, or irritation at site where injected  -pinpoint red spots on the skin  -red or dark-brown urine  -shortness of breath or breathing problems  -stomach or side pain, or pain at the shoulder  -swelling  -tiredness  -trouble passing urine or  change in the amount of urine  Side effects that usually do not require medical attention (report to your doctor or health care professional if they continue or are bothersome):  -bone pain  -muscle pain  This list may not describe all possible side effects. Call your doctor for medical advice about side effects. You may report side effects to FDA at 1-800-FDA-1088.  Where should I keep my medicine?  Keep out of the reach of children.  If you are using this medicine at home, you will be instructed on how to store it. Throw away any unused medicine after the expiration date on the label.  NOTE: This sheet is a summary. It may not cover all possible information. If you have questions about this medicine, talk to your doctor, pharmacist, or health care provider.   2019 [employer] (2018-01-25 16:57:08)

## 2018-12-19 ENCOUNTER — Other Ambulatory Visit: Payer: Self-pay | Admitting: Hematology

## 2018-12-23 NOTE — Progress Notes (Signed)
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  HISTORY OF PRESENTING ILLNESS:   [name] is a wonderful 58 y.o. male who has been referred to [place] by Dr. Francine Graven for evaluation and management of Liver masses. He is accompanied today by his wife and aunt. The pt reports that he is doing well overall.   The pt presented to the ED on 10/01/18 regarding persistent RUQ abdominal pain which began a month prior, and noted associations with nausea and 40 pound weight loss. He was evaluated with a CT A/P, as noted below, which revealed liver masses.   The pt reports that he first began feeling RUQ abdominal pain about 3-4 weeks ago, but began losing weight about 2 months ago. He notes that he began feeling more fatigue about 2 months ago as well.  The pt notes that the color of his bowels became dark black and denies taking Iron pills or bismuth medications. He notes that he began feeling constipated as well, feeling backed up. He notes that his stools began looking thinner in caliber. The pt notes that his appetite began decreasing over the last 3-4 weeks ago, and some of this he attributes to intermittent nausea. He denies problems swallowing food. He endorses RUQ pain, and denies vomiting and new cough, new bone pains, testicular pain or swelling, problems passing urine, blood in the urine. The pt has never had a colonoscopy and does not have a PCP, and cites his lack of health insurance as the reason.  The pt was released from the ED with Oxycodone and has been using this to treat his RUQ pain successfully.   The pt notes gout and a heart murmur as previous medical history. He notes that he hasn't consumed alcohol in the past 2 years whatsoever, but prior to this alcohol was  a problem for him.   Of note prior to the patient's visit today, pt has had a CT A/P completed on 10/01/18 with results revealing Interval development of numerous ill-defined hypodense masses throughout the entire liver compatible with metastatic disease. There is a 2.5 cm enhancing mass over the upper pole cortex of the right kidney suspicious for renal cell carcinoma. 2.  No acute findings in the abdomen/pelvis. 3. 1 cm hypodensity over the pancreatic tail not well seen on previous noncontrast exams. MR may be helpful for further evaluation on an elective basis.   Most recent lab results (10/06/18) of CBC w/diff and CMP is as follows: all values are WNL except for Monocytes abs at 1.2k, Sodium at 134, Albumin at 3.0, AST at 77, ALT at 63, Alk Phos at 392, Total Bilirubin at 2.3   On review of systems, pt reports new constipation, smaller caliber stools, dark stools, unexpected weight loss, RUQ pain, new fatigue, and denies vomiting, cough, new SOB, changes in breathing, new bone pains, leg swelling, blood in the urine, changes in urination, fevers, pain along the spine, and any other symptoms.   On PMHx the pt reports Gout, heart murmur, previous alcohol abuse. He denies any surgeries.  On Social Hx the pt reports that he hasn't consumed alcohol in the past 2 years whatsoever. He notes that alcohol abuse was previously a concern. He denies ever smoking cigarettes or doing drugs. He denies any radiation or chemical exposure.  On Family  Hx the pt reports mother with pancreatic cancer in 28s, maternal aunts with high blood pressure and heart failure, maternal aunt with breast cancer.  Interval History:  [name] returns today for management, evaluation, prior to C3 treatment of his newly diagnosed High grade neuroendocrine carcinoma. The patient's last visit with [place] was on 12/15/18. He is accompanied today by his wife and daughter. The pt reports that he is doing well overall.   The pt reports  that his left elbow, left wrist and left ankle are all improved since our last visit, but retain some slight warmth to the touch. He notes that he has had gout flairs fairly infrequently, about 2 per year. He denies any limitation in his ROM at these joints.   The pt also notes that he is eating very well and his leg swelling has also resolved so he is no longer taking lasix. He denies any nausea or other difficulties tolerating his most recent C3 infusion.  Lab results today (12/24/18) of CBC w/diff and CMP is as follows: all values are WNL except for WBC at 23.5k, RBC at 3.21, HGB at 9.0, PLT at 114k, ANC at 19.1k, Monocytes abs at 2.0k, Glucose at 124, Calcium at 8.1, Albumin at 3.2, AST at 14, Alk Phos at 207. 12/24/18 Uric acid at 8.8  On review of systems, pt reports good energy levels, eating well, full joint ROM, and denies falls, abdominal pain, leg swelling, and any other symptoms.   MEDICAL HISTORY:  Past Medical History:  Diagnosis Date  . Arthritis    "knees; left hand"  . Cancer ([place])   . Gout   . Heart murmur    Longstanding. 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 Hypertension   . Near syncope    05/2011 with tachypalpitations & with echo showing mild LVH, normal EF 55-60%, trivial AR    SURGICAL HISTORY: Past Surgical History:  Procedure Laterality Date  . IR IMAGING GUIDED PORT INSERTION  10/26/2018  . NO PAST SURGERIES      SOCIAL HISTORY: Social History   Socioeconomic History  . Marital status: Married    Spouse name: Not on file  . Number of children: 1  . Years of education: Not on file  . Highest education level: Not on file  Occupational History  . Occupation: Unemployed  Social Needs  . Financial resource strain: Not on file  . Food insecurity:    Worry: Not on file    Inability: Not on file  . Transportation needs:    Medical: Not on file    Non-medical: Not on file  Tobacco Use  . Smoking status: Never Smoker  .  Smokeless tobacco: Never Used  Substance and Sexual Activity  . Alcohol use: Not Currently    Comment: states he has cut back/ every now and again  . Drug use: No  . Sexual activity: Yes    Partners: Female  Lifestyle  . Physical activity:    Days per week: Not on file    Minutes per session: Not on file  . Stress: Not on file  Relationships  . Social connections:    Talks on phone: Not on file    Gets together: Not on file    Attends religious service: Not on file    Active member of club or organization: Not on file    Attends meetings of clubs or organizations: Not on file    Relationship status: Not on  file  . Intimate partner violence:    Fear of current or ex partner: Not on file    Emotionally abused: Not on file    Physically abused: Not on file    Forced sexual activity: Not on file  Other Topics Concern  . Not on file  Social History Narrative   Married. 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 Is from [place].     FAMILY HISTORY: Family History  Problem Relation Age of Onset  . Cancer Mother        Possibly pancreatic. Died at age 63  . Prostate cancer Father   . Breast cancer Cousin   . Diabetes Paternal Uncle   . Heart disease Paternal Uncle     ALLERGIES:  has No Known Allergies.  MEDICATIONS:  Current Outpatient Medications  Medication Sig Dispense Refill  . allopurinol (ZYLOPRIM) 100 MG tablet Take 1 tablet (100 mg total) by mouth daily. 30 tablet 1  . clindamycin (CLEOCIN) 300 MG capsule Take 1 capsule (300 mg total) by mouth 4 (four) times daily. X 7 days 28 capsule 0  . colchicine 0.6 MG tablet Take 1 tablet (0.6 mg total) by mouth See admin instructions. Take 1.66m (2 tablets) followed by .657m(1 tablet) 1 hour later. (Patient not taking: Reported on 12/11/2018) 3 tablet 0  . dexamethasone (DECADRON) 4 MG tablet 29m72mo daily with breakfast for 2 weeks then every other day for 2 weeks 30 tablet 0  .  furosemide (LASIX) 20 MG tablet Take 1 tablet (20 mg total) by mouth daily. 30 tablet 1  . lidocaine-prilocaine (EMLA) cream Apply to affected area once (Patient not taking: Reported on 12/11/2018) 30 g 3  . LORazepam (ATIVAN) 0.5 MG tablet Take 1 tablet (0.5 mg total) by mouth every 6 (six) hours as needed (Nausea or vomiting). 30 tablet 0  . ondansetron (ZOFRAN ODT) 4 MG disintegrating tablet Take 1 tablet (4 mg total) by mouth every 8 (eight) hours as needed for nausea or vomiting. 6 tablet 0  . oxyCODONE (OXY IR/ROXICODONE) 5 MG immediate release tablet Take 1-2 tablets (5-10 mg total) by mouth every 4 (four) hours as needed for severe pain. 90 tablet 0  . potassium chloride SA (K-DUR,KLOR-CON) 20 MEQ tablet Take 1 tablet (20 mEq total) by mouth daily. With lasix 30 tablet 1  . prochlorperazine (COMPAZINE) 10 MG tablet Take 1 tablet (10 mg total) by mouth every 6 (six) hours as needed (Nausea or vomiting). 30 tablet 1   No current facility-administered medications for this visit.     REVIEW OF SYSTEMS:    A 10+ POINT REVIEW OF SYSTEMS WAS OBTAINED including neurology, dermatology, psychiatry, cardiac, respiratory, lymph, extremities, GI, GU, Musculoskeletal, constitutional, breasts, reproductive, HEENT.  All pertinent positives are noted in the HPI.  All others are negative.    PHYSICAL EXAMINATION: ECOG PERFORMANCE STATUS: 1 - Symptomatic but completely ambulatory  Vitals:   12/24/18 1052  BP: 135/66  Pulse: 65  Resp: 17  Temp: 98.2 F (36.8 C)  SpO2: 100%   Filed Weights   12/24/18 1052  Weight: 156 lb 9.6 oz (71 kg)   .Body mass index is 24.53 kg/m.  GENERAL:alert, in no acute distress and comfortable SKIN: no acute rashes, no significant lesions EYES: conjunctiva are pink and non-injected, sclera anicteric OROPHARYNX: MMM, no exudates, no oropharyngeal erythema or ulceration NECK: supple, no JVD LYMPH:  no palpable lymphadenopathy in the cervical, axillary or inguinal  regions LUNGS: clear to auscultation b/l with normal respiratory effort HEART: regular rate & rhythm ABDOMEN:  normoactive bowel sounds , tender over the RUQ, not distended. Improving hepatomegaly, now palpable 3cm under right mid-costal margin line Extremity: no pedal edema PSYCH: alert & oriented x 3 with fluent speech NEURO: no focal motor/sensory deficits   LABORATORY DATA:  I have reviewed the data as listed  . CBC Latest Ref Rng & Units 12/24/2018 12/15/2018 12/11/2018  WBC 4.0 - 10.5 K/uL 23.5(H) 25.6(H) 34.4(H)  Hemoglobin 13.0 - 17.0 g/dL 9.0(L) 10.1(L) 9.5(L)  Hematocrit 39.0 - 52.0 % 28.5(L) 32.5(L) 31.1(L)  Platelets 150 - 400 K/uL 114(L) 209 157    . CMP Latest Ref Rng & Units 12/24/2018 12/15/2018 12/11/2018  Glucose 70 - 99 mg/dL 124(H) 176(H) 126(H)  BUN 6 - 20 mg/dL _0 Creatinine 0.61 - 1.24 mg/dL 0.67 0.77 0.57(L)  Sodium 135 - 145 mmol/L 141 135 135  Potassium 3.5 - 5.1 mmol/L 3.9 4.2 3.9  Chloride 98 - 111 mmol/L 106 102 103  CO2 22 - 32 mmol/L _1 Calcium 8.9 - 10.3 mg/dL 8.1(L) 8.0(L) 7.9(L)  Total Protein 6.5 - 8.1 g/dL 6.6 7.6 -  Total Bilirubin 0.3 - 1.2 mg/dL 0.4 0.7 -  Alkaline Phos 38 - 126 U/L 207(H) 166(H) -  AST 15 - 41 U/L 14(L) 13(L) -  ALT 0 - 44 U/L 14 6 -    10/08/18 Liver Biopsy:    RADIOGRAPHIC STUDIES: I have personally reviewed the radiological images as listed and agreed with the findings in the report. Dg Elbow 2 Views Left  Result Date: 12/15/2018 CLINICAL DATA:  Posterior elbow pain.  Trauma. EXAM: LEFT ELBOW - 2 VIEW COMPARISON:  Radiographs 12/27/2011. FINDINGS: The mineralization and alignment are normal. There is no evidence of acute fracture or dislocation. The joint spaces are preserved. No significant joint effusion. There is spurring of the olecranon and lateral humeral epicondyle, similar to previous study. IMPRESSION: No acute osseous findings.  Stable osseous spurring. Electronically Signed   By: Richardean Sale  M.D.   On: 12/15/2018 15:24   Dg Wrist Complete Left  Result Date: 12/15/2018 CLINICAL DATA:  Trauma with pain and swelling in the left wrist and hand. EXAM: LEFT WRIST - COMPLETE 3+ VIEW COMPARISON:  Radiographs 02/28/2018. FINDINGS: The mineralization and alignment are normal. There is no evidence of acute fracture or dislocation. Stable mild degenerative changes in the radial aspect of the wrist. Possible mild dorsal soft tissue swelling without foreign body. IMPRESSION: No acute osseous findings. Possible mild dorsal soft tissue swelling. Electronically Signed   By: Richardean Sale M.D.   On: 12/15/2018 15:19   Dg Hand Complete Left  Result Date: 12/15/2018 CLINICAL DATA:  Trauma with pain and swelling in the left wrist and hand. EXAM: LEFT HAND - COMPLETE 3+ VIEW COMPARISON:  Radiographs 02/28/2018. FINDINGS: The mineralization and alignment are normal. There is no evidence of acute fracture or dislocation. There are chronic arthropathic changes at the PIP joint of the small finger. There is also mild subchondral sclerosis and cyst formation in the 2nd and 3rd metacarpal heads. There are possible small erosions involving the base and head of the 2nd proximal phalanx. Possible mild dorsal soft tissue swelling without foreign body. IMPRESSION: 1. No acute osseous findings. 2. Mildly progressive arthropathic changes as described with questionable new erosion involving the medial base of the 2nd proximal phalanx. Electronically Signed   By: Caryl Comes.D.  On: 12/15/2018 15:22    ASSESSMENT & PLAN:  58 y.o. male with  1. 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 There is a 2.5 cm enhancing mass over the upper pole cortex of the right kidney suspicious for renal cell carcinoma. 2.  No acute findings in the  abdomen/pelvis. 3. 1 cm hypodensity over the pancreatic tail not well seen on previous noncontrast exams. MR may be helpful for further evaluation on an elective basis.    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 2. Upper pole right renal lesion is not definitely hypermetabolic and has been present since 2012. I think is unlikely the cause of the metastatic disease. Recommend liver biopsy for tissue diagnosis. If needed, an MRI of the abdomen without and with contrast may be helpful for further evaluation of the renal lesion. 3. Two sub 3 mm right upper lobe pulmonary nodules are indeterminate.   10/26/18 MRI Brain revealed No acute abnormality and negative for metastatic disease.  2. Abnormal LFTs - likely from liver metastases  Labs upon initial presentation from 10/06/18, some borderline monocytosis at 1.2k, otherwise normal blood counts, AST at 77, ALT at 63, Alk Phos at 392, Total Bilirubin at 2.3   3. Rt renal mass - concerning for RCC  PLAN:  -Discussed pt labwork today, 12/24/18; WBC at 23.5k in setting of G-CSF support, HGB holding at 9.0, PLT holding at 114k. Chemistries stable. Uric acid higher at 8.8 -Begin 123m allopurinol every day -Low dose dexamethasone for another couple weeks for gout and appetite stimulation -The pt has no prohibitive toxicities from C3 Carboplatin and 20% dose reduced Etoposide at this time.   -Will repeat scans after C4 -Regarding the patient's C1 thrombocytopenia, did decrease dose of Etoposide by 20% for C2 treatment -Continue with compression socks and leg elevation  -Lasix as needed, with Potassium replacement  -Continue high protein and high calorie meal  supplements such as Boost or Ensure -Plan for Carboplatin and Etoposide every 3 weeks, with G-CSF support for up to 6 cycles -Strongly recommended that the pt not lift, push, or pull anything to prevent the risk of pathologic fractures as the PET/CT does indicate bone involvement -Continue Xgeva every 4 weeks -Oxycodone for pain mx, up to every 6 hours -Recommended that the pt continue to eat well, drink at least 48-64 oz of water each day, and walk 20-30 minutes each day.  -Will see the pt back in 2 weeks   F/u for C4 of treatment with labs and MD visit as scheduled for 01/05/19   All of the patients questions were answered with apparent satisfaction. The patient knows to call the clinic with any problems, questions or concerns.  The total time spent in the appt was 25 minutes and more than 50% was on counseling and direct patient cares.    GSullivan LoneMD MHeardAAHIVMS SGood Shepherd Specialty HospitalCSaint Anne'S HospitalHematology/Oncology Physician [facility]ele[facility] (Office):       [phone](Work cell):  [phone](Fax):           [phone] 12/24/2018 11:26 AM  I, SDanise Mina  Bain, am acting as a scribe for Dr. Sullivan Lone.   .I have reviewed the above documentation for accuracy and completeness, and I agree with the above. Brunetta Genera MD

## 2018-12-24 ENCOUNTER — Telehealth: Payer: Self-pay | Admitting: Hematology

## 2018-12-24 ENCOUNTER — Inpatient Hospital Stay: Payer: Medicaid Other

## 2018-12-24 ENCOUNTER — Inpatient Hospital Stay (HOSPITAL_BASED_OUTPATIENT_CLINIC_OR_DEPARTMENT_OTHER): Payer: Medicaid Other | Admitting: Hematology

## 2018-12-24 VITALS — BP 135/66 | HR 65 | Temp 98.2°F | Resp 17 | Ht 67.0 in | Wt 156.6 lb

## 2018-12-24 DIAGNOSIS — C7A1 Malignant poorly differentiated neuroendocrine tumors: Secondary | ICD-10-CM

## 2018-12-24 DIAGNOSIS — C787 Secondary malignant neoplasm of liver and intrahepatic bile duct: Secondary | ICD-10-CM

## 2018-12-24 DIAGNOSIS — D696 Thrombocytopenia, unspecified: Secondary | ICD-10-CM

## 2018-12-24 DIAGNOSIS — C7B02 Secondary carcinoid tumors of liver: Secondary | ICD-10-CM | POA: Diagnosis not present

## 2018-12-24 DIAGNOSIS — C801 Malignant (primary) neoplasm, unspecified: Secondary | ICD-10-CM

## 2018-12-24 DIAGNOSIS — M109 Gout, unspecified: Secondary | ICD-10-CM

## 2018-12-24 DIAGNOSIS — Z95828 Presence of other vascular implants and grafts: Secondary | ICD-10-CM

## 2018-12-24 DIAGNOSIS — D72821 Monocytosis (symptomatic): Secondary | ICD-10-CM

## 2018-12-24 DIAGNOSIS — R918 Other nonspecific abnormal finding of lung field: Secondary | ICD-10-CM

## 2018-12-24 DIAGNOSIS — Z5111 Encounter for antineoplastic chemotherapy: Secondary | ICD-10-CM | POA: Diagnosis not present

## 2018-12-24 DIAGNOSIS — R945 Abnormal results of liver function studies: Secondary | ICD-10-CM

## 2018-12-24 DIAGNOSIS — R630 Anorexia: Secondary | ICD-10-CM

## 2018-12-24 DIAGNOSIS — Z7189 Other specified counseling: Secondary | ICD-10-CM

## 2018-12-24 DIAGNOSIS — N2889 Other specified disorders of kidney and ureter: Secondary | ICD-10-CM

## 2018-12-24 DIAGNOSIS — C7951 Secondary malignant neoplasm of bone: Secondary | ICD-10-CM

## 2018-12-24 DIAGNOSIS — Z79899 Other long term (current) drug therapy: Secondary | ICD-10-CM

## 2018-12-24 LAB — CMP (CANCER CENTER ONLY)
ALT: 14 U/L (ref 0–44)
AST: 14 U/L — ABNORMAL LOW (ref 15–41)
Albumin: 3.2 g/dL — ABNORMAL LOW (ref 3.5–5.0)
Alkaline Phosphatase: 207 U/L — ABNORMAL HIGH (ref 38–126)
Anion gap: 9 (ref 5–15)
BUN: 10 mg/dL (ref 6–20)
CO2: 26 mmol/L (ref 22–32)
Calcium: 8.1 mg/dL — ABNORMAL LOW (ref 8.9–10.3)
Chloride: 106 mmol/L (ref 98–111)
Creatinine: 0.67 mg/dL (ref 0.61–1.24)
GFR, Est AFR Am: >60 mL/min (ref >60)
GFR, Estimated: >60 mL/min (ref >60)
Glucose, Bld: 124 mg/dL — ABNORMAL HIGH (ref 70–99)
Potassium: 3.9 mmol/L (ref 3.5–5.1)
Sodium: 141 mmol/L (ref 135–145)
Total Bilirubin: 0.4 mg/dL (ref 0.3–1.2)
Total Protein: 6.6 g/dL (ref 6.5–8.1)

## 2018-12-24 LAB — CBC WITH DIFFERENTIAL/PLATELET
Abs Immature Granulocytes: 0.52 10*3/uL — ABNORMAL HIGH (ref 0.00–0.07)
Basophils Absolute: 0.1 10*3/uL (ref 0.0–0.1)
Basophils Relative: 1 %
EOS PCT: 0 %
Eosinophils Absolute: 0.1 10*3/uL (ref 0.0–0.5)
HCT: 28.5 % — ABNORMAL LOW (ref 39.0–52.0)
Hemoglobin: 9 g/dL — ABNORMAL LOW (ref 13.0–17.0)
Immature Granulocytes: 2 %
Lymphocytes Relative: 7 %
Lymphs Abs: 1.7 10*3/uL (ref 0.7–4.0)
MCH: 28 pg (ref 26.0–34.0)
MCHC: 31.6 g/dL (ref 30.0–36.0)
MCV: 88.8 fL (ref 80.0–100.0)
MONO ABS: 2 10*3/uL — AB (ref 0.1–1.0)
MONOS PCT: 9 %
Neutro Abs: 19.1 10*3/uL — ABNORMAL HIGH (ref 1.7–7.7)
Neutrophils Relative %: 81 %
Platelets: 114 10*3/uL — ABNORMAL LOW (ref 150–400)
RBC: 3.21 MIL/uL — ABNORMAL LOW (ref 4.22–5.81)
RDW: 15.1 % (ref 11.5–15.5)
WBC: 23.5 10*3/uL — ABNORMAL HIGH (ref 4.0–10.5)
nRBC: 0.1 % (ref 0.0–0.2)

## 2018-12-24 LAB — URIC ACID: Uric Acid, Serum: 8.8 mg/dL — ABNORMAL HIGH (ref 3.7–8.6)

## 2018-12-24 MED ORDER — ALLOPURINOL 100 MG PO TABS: 100.0000 mg | ORAL_TABLET | Freq: Every day | ORAL | 1 refills | Status: DC

## 2018-12-24 MED ORDER — SODIUM CHLORIDE 0.9% FLUSH
10.0000 mL | INTRAVENOUS | Status: DC | PRN
  Administered 2018-12-24: 10 mL
  Filled 2018-12-24: qty 10

## 2018-12-24 MED ORDER — DEXAMETHASONE 4 MG PO TABS: ORAL_TABLET | ORAL | 0 refills | Status: DC

## 2018-12-24 MED ORDER — HEPARIN SOD (PORK) LOCK FLUSH 100 UNIT/ML IV SOLN
500.0000 [IU] | Freq: Once | INTRAVENOUS | Status: AC | PRN
  Administered 2018-12-24: 500 [IU]
  Filled 2018-12-24: qty 5

## 2018-12-24 NOTE — Telephone Encounter (Signed)
Scheduled appt per 2/21 los.  Printed calendar.

## 2019-01-03 ENCOUNTER — Encounter: Payer: Self-pay | Admitting: Pharmacist

## 2019-01-04 NOTE — Progress Notes (Signed)
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  HISTORY OF PRESENTING ILLNESS:   [name] is a wonderful 58 y.o. male who has been referred to [place] by Dr. Francine Graven for evaluation and management of Liver masses. He is accompanied today by his wife and aunt. The pt reports that he is doing well overall.   The pt presented to the ED on 10/01/18 regarding persistent RUQ abdominal pain which began a month prior, and noted associations with nausea and 40 pound weight loss. He was evaluated with a CT A/P, as noted below, which revealed liver masses.   The pt reports that he first began feeling RUQ abdominal pain about 3-4 weeks ago, but began losing weight about 2 months ago. He notes that he began feeling more fatigue about 2 months ago as well.  The pt notes that the color of his bowels became dark black and denies taking Iron pills or bismuth medications. He notes that he began feeling constipated as well, feeling backed up. He notes that his stools began looking thinner in caliber. The pt notes that his appetite began decreasing over the last 3-4 weeks ago, and some of this he attributes to intermittent nausea. He denies problems swallowing food. He endorses RUQ pain, and denies vomiting and new cough, new bone pains, testicular pain or swelling, problems passing urine, blood in the urine. The pt has never had a colonoscopy and does not have a PCP, and cites his lack of health insurance as the reason.  The pt was released from the ED with Oxycodone and has been using this to treat his RUQ pain successfully.   The pt notes gout and a heart murmur as previous medical history. He notes that he hasn't consumed alcohol in the past 2 years whatsoever, but prior to this alcohol was  a problem for him.   Of note prior to the patient's visit today, pt has had a CT A/P completed on 10/01/18 with results revealing Interval development of numerous ill-defined hypodense masses throughout the entire liver compatible with metastatic disease. There is a 2.5 cm enhancing mass over the upper pole cortex of the right kidney suspicious for renal cell carcinoma. 2.  No acute findings in the abdomen/pelvis. 3. 1 cm hypodensity over the pancreatic tail not well seen on previous noncontrast exams. MR may be helpful for further evaluation on an elective basis.   Most recent lab results (10/06/18) of CBC w/diff and CMP is as follows: all values are WNL except for Monocytes abs at 1.2k, Sodium at 134, Albumin at 3.0, AST at 77, ALT at 63, Alk Phos at 392, Total Bilirubin at 2.3   On review of systems, pt reports new constipation, smaller caliber stools, dark stools, unexpected weight loss, RUQ pain, new fatigue, and denies vomiting, cough, new SOB, changes in breathing, new bone pains, leg swelling, blood in the urine, changes in urination, fevers, pain along the spine, and any other symptoms.   On PMHx the pt reports Gout, heart murmur, previous alcohol abuse. He denies any surgeries.  On Social Hx the pt reports that he hasn't consumed alcohol in the past 2 years whatsoever. He notes that alcohol abuse was previously a concern. He denies ever smoking cigarettes or doing drugs. He denies any radiation or chemical exposure.  On Family  Hx the pt reports mother with pancreatic cancer in 41s, maternal aunts with high blood pressure and heart failure, maternal aunt with breast cancer.  Interval History:  [name] returns today for management, evaluation, prior to C3 treatment of his newly diagnosed High grade neuroendocrine carcinoma. The patient's last visit with [place] was on 12/24/18. He is accompanied today by his wife. The pt reports that he is doing well overall.   The pt reports that he had some  nausea/heartburn after taking steroids for his previous gout flare. He notes that he stopped taking this a couple days ago.  The pt notes that his leg swelling has resolved. He has continued to eat very well and notes that he has gained 10 pounds in the interim. He denies concerns of infections.  Lab results today (01/05/19) of CBC w/diff and CMP is as follows: all values are WNL except for WBC at 20.9k, RBC at 3.43, HGB at 9.8, HCT at 32.1, RDW at 18.8, PLT at 123k, ANC at 13.3k, Monocytes abs at 1.6k, Glucose at 125, Calcium at 8.2, Albumin at 3.2. 01/05/19 Magnesium at 1.8  On review of systems, pt reports eating well, resolved leg swelling, weight gain, and denies vomiting, diarrhea, abdominal pain, concern for infections, concern for gout flares, and any other symptoms.   MEDICAL HISTORY:  Past Medical History:  Diagnosis Date   Arthritis    "knees; left hand"   Cancer ([place])    Gout    Heart murmur    Longstanding. Mild aortic valve thickening with trivial AR by echo 02/2012   Hypertension    Near syncope    05/2011 with tachypalpitations & with echo showing mild LVH, normal EF 55-60%, trivial AR    SURGICAL HISTORY: Past Surgical History:  Procedure Laterality Date   IR IMAGING GUIDED PORT INSERTION  10/26/2018   NO PAST SURGERIES      SOCIAL HISTORY: Social History   Socioeconomic History   Marital status: Married    Spouse name: Not on file   Number of children: 1   Years of education: Not on file   Highest education level: Not on file  Occupational History   Occupation: Unemployed  Scientist, product/process development strain: Not on file   Food insecurity:    Worry: Not on file    Inability: Not on file   Transportation needs:    Medical: Not on file    Non-medical: Not on file  Tobacco Use   Smoking status: Never Smoker   Smokeless tobacco: Never Used  Substance and Sexual Activity   Alcohol use: Not Currently    Comment: states he has cut  back/ every now and again   Drug use: No   Sexual activity: Yes    Partners: Female  Lifestyle   Physical activity:    Days per week: Not on file    Minutes per session: Not on file   Stress: Not on file  Relationships   Social connections:    Talks on phone: Not on file    Gets together: Not on file    Attends religious service: Not on file    Active member of club or organization: Not on file    Attends meetings of clubs or organizations: Not on file    Relationship status: Not on file   Intimate partner violence:    Fear of current or ex partner: Not on file    Emotionally abused: Not on file  Physically abused: Not on file    Forced sexual activity: Not on file  Other Topics Concern   Not on file  Social History Narrative   Married. 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 Is from [place].     FAMILY HISTORY: Family History  Problem Relation Age of Onset   Cancer Mother        Possibly pancreatic. Died at age 62   Prostate cancer Father    Breast cancer Cousin    Diabetes Paternal Uncle    Heart disease Paternal Uncle     ALLERGIES:  has No Known Allergies.  MEDICATIONS:  Current Outpatient Medications  Medication Sig Dispense Refill   allopurinol (ZYLOPRIM) 100 MG tablet Take 1 tablet (100 mg total) by mouth daily. 30 tablet 1   clindamycin (CLEOCIN) 300 MG capsule Take 1 capsule (300 mg total) by mouth 4 (four) times daily. X 7 days 28 capsule 0   colchicine 0.6 MG tablet Take 1 tablet (0.6 mg total) by mouth See admin instructions. Take 1.660m (2 tablets) followed by .668m(1 tablet) 1 hour later. (Patient not taking: Reported on 12/11/2018) 3 tablet 0   dexamethasone (DECADRON) 4 MG tablet 60m67mo daily with breakfast for 2 weeks then every other day for 2 weeks 30 tablet 0   furosemide (LASIX) 20 MG tablet Take 1 tablet (20 mg total) by mouth daily. 30 tablet 1   lidocaine-prilocaine (EMLA) cream  Apply to affected area once (Patient not taking: Reported on 12/11/2018) 30 g 3   LORazepam (ATIVAN) 0.5 MG tablet Take 1 tablet (0.5 mg total) by mouth every 6 (six) hours as needed (Nausea or vomiting). 30 tablet 0   ondansetron (ZOFRAN ODT) 4 MG disintegrating tablet Take 1 tablet (4 mg total) by mouth every 8 (eight) hours as needed for nausea or vomiting. 6 tablet 0   oxyCODONE (OXY IR/ROXICODONE) 5 MG immediate release tablet Take 1-2 tablets (5-10 mg total) by mouth every 4 (four) hours as needed for severe pain. 90 tablet 0   potassium chloride SA (K-DUR,KLOR-CON) 20 MEQ tablet Take 1 tablet (20 mEq total) by mouth daily. With lasix 30 tablet 1   prochlorperazine (COMPAZINE) 10 MG tablet Take 1 tablet (10 mg total) by mouth every 6 (six) hours as needed (Nausea or vomiting). 30 tablet 1   No current facility-administered medications for this visit.     REVIEW OF SYSTEMS:    A 10+ POINT REVIEW OF SYSTEMS WAS OBTAINED including neurology, dermatology, psychiatry, cardiac, respiratory, lymph, extremities, GI, GU, Musculoskeletal, constitutional, breasts, reproductive, HEENT.  All pertinent positives are noted in the HPI.  All others are negative.   PHYSICAL EXAMINATION: ECOG PERFORMANCE STATUS: 1 - Symptomatic but completely ambulatory  Vitals:   01/05/19 0847  BP: 139/71  Pulse: 66  Resp: 18  Temp: 98.7 F (37.1 C)  SpO2: 100%   Filed Weights   01/05/19 0847  Weight: 169 lb 9.6 oz (76.9 kg)   .Body mass index is 26.56 kg/m.  GENERAL:alert, in no acute distress and comfortable SKIN: no acute rashes, no significant lesions EYES: conjunctiva are pink and non-injected, sclera anicteric OROPHARYNX: MMM, no exudates, no oropharyngeal erythema or ulceration NECK: supple, no JVD LYMPH:  no palpable lymphadenopathy in the cervical, axillary or inguinal regions LUNGS: clear to auscultation b/l with normal respiratory effort HEART: regular rate & rhythm ABDOMEN:  normoactive  bowel sounds , tender over the RUQ, not distended. Improving hepatomegaly, now  palpable 3cm under right mid-costal margin line.  Extremity: no pedal edema PSYCH: alert & oriented x 3 with fluent speech NEURO: no focal motor/sensory deficits   LABORATORY DATA:  I have reviewed the data as listed  . CBC Latest Ref Rng & Units 01/05/2019 12/24/2018 12/15/2018  WBC 4.0 - 10.5 K/uL 20.9(H) 23.5(H) 25.6(H)  Hemoglobin 13.0 - 17.0 g/dL 9.8(L) 9.0(L) 10.1(L)  Hematocrit 39.0 - 52.0 % 32.1(L) 28.5(L) 32.5(L)  Platelets 150 - 400 K/uL 123(L) 114(L) 209    . CMP Latest Ref Rng & Units 01/05/2019 12/24/2018 12/15/2018  Glucose 70 - 99 mg/dL 125(H) 124(H) 176(H)  BUN 6 - 20 mg/dL '12 10 9  ' Creatinine 0.61 - 1.24 mg/dL 0.74 0.67 0.77  Sodium 135 - 145 mmol/L 138 141 135  Potassium 3.5 - 5.1 mmol/L 3.9 3.9 4.2  Chloride 98 - 111 mmol/L 102 106 102  CO2 22 - 32 mmol/L '29 26 25  ' Calcium 8.9 - 10.3 mg/dL 8.2(L) 8.1(L) 8.0(L)  Total Protein 6.5 - 8.1 g/dL 6.6 6.6 7.6  Total Bilirubin 0.3 - 1.2 mg/dL 0.5 0.4 0.7  Alkaline Phos 38 - 126 U/L 111 207(H) 166(H)  AST 15 - 41 U/L 15 14(L) 13(L)  ALT 0 - 44 U/L '15 14 6    ' 10/08/18 Liver Biopsy:    RADIOGRAPHIC STUDIES: I have personally reviewed the radiological images as listed and agreed with the findings in the report. Dg Elbow 2 Views Left  Result Date: 12/15/2018 CLINICAL DATA:  Posterior elbow pain.  Trauma. EXAM: LEFT ELBOW - 2 VIEW COMPARISON:  Radiographs 12/27/2011. FINDINGS: The mineralization and alignment are normal. There is no evidence of acute fracture or dislocation. The joint spaces are preserved. No significant joint effusion. There is spurring of the olecranon and lateral humeral epicondyle, similar to previous study. IMPRESSION: No acute osseous findings.  Stable osseous spurring. Electronically Signed   By: Richardean Sale M.D.   On: 12/15/2018 15:24   Dg Wrist Complete Left  Result Date: 12/15/2018 CLINICAL DATA:  Trauma with pain and  swelling in the left wrist and hand. EXAM: LEFT WRIST - COMPLETE 3+ VIEW COMPARISON:  Radiographs 02/28/2018. FINDINGS: The mineralization and alignment are normal. There is no evidence of acute fracture or dislocation. Stable mild degenerative changes in the radial aspect of the wrist. Possible mild dorsal soft tissue swelling without foreign body. IMPRESSION: No acute osseous findings. Possible mild dorsal soft tissue swelling. Electronically Signed   By: Richardean Sale M.D.   On: 12/15/2018 15:19   Dg Hand Complete Left  Result Date: 12/15/2018 CLINICAL DATA:  Trauma with pain and swelling in the left wrist and hand. EXAM: LEFT HAND - COMPLETE 3+ VIEW COMPARISON:  Radiographs 02/28/2018. FINDINGS: The mineralization and alignment are normal. There is no evidence of acute fracture or dislocation. There are chronic arthropathic changes at the PIP joint of the small finger. There is also mild subchondral sclerosis and cyst formation in the 2nd and 3rd metacarpal heads. There are possible small erosions involving the base and head of the 2nd proximal phalanx. Possible mild dorsal soft tissue swelling without foreign body. IMPRESSION: 1. No acute osseous findings. 2. Mildly progressive arthropathic changes as described with questionable new erosion involving the medial base of the 2nd proximal phalanx. Electronically Signed   By: Richardean Sale M.D.   On: 12/15/2018 15:22    ASSESSMENT & PLAN:  58 y.o. male with  1. Metastatic High Grade Neuroendocrine Carcinoma with Multiple liver masses  10/01/18  CT A/P revealed Interval development of numerous ill-defined hypodense masses throughout the entire liver compatible with metastatic disease. There is a 2.5 cm enhancing mass over the upper pole cortex of the right kidney suspicious for renal cell carcinoma. 2.  No acute findings in the abdomen/pelvis. 3. 1 cm hypodensity over the pancreatic tail not well seen on previous noncontrast exams. MR may be helpful  for further evaluation on an elective basis.    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 2. Upper pole right renal lesion is not definitely hypermetabolic and has been present since 2012. I think is unlikely the cause of the metastatic disease. Recommend liver biopsy for tissue diagnosis. If needed, an MRI of the abdomen without and with contrast may be helpful for further evaluation of the renal lesion. 3. Two sub 3 mm right upper lobe pulmonary nodules are indeterminate.   10/26/18 MRI Brain revealed No acute abnormality and negative for metastatic disease.  2. Abnormal LFTs - likely from liver metastases  Labs upon initial presentation from 10/06/18, some borderline monocytosis at 1.2k, otherwise normal blood counts, AST at 77, ALT at 63, Alk Phos at 392, Total Bilirubin at 2.3   3. Rt renal mass - concerning for RCC  PLAN:  -Discussed pt labwork today, 01/05/19; liver functions have normalized, ANC at 13.3k in setting of G-CSF support, other blood counts are stable -The pt has no prohibitive toxicities from continuing C4 Carboplatin and 20% dose reduced Etoposide at this time. -Will repeat PET/CT after C4 to evaluate treatment response and for further treatment planning -Regarding the patient's C1 thrombocytopenia, did decrease dose of Etoposide by 20% for C2 treatment -Continue with compression socks and leg elevation  -Lasix as needed, with Potassium replacement  -Continue high protein and high calorie meal supplements such as Boost or Ensure -Plan for Carboplatin and Etoposide every 3 weeks, with G-CSF support for up to 6 cycles -Strongly recommended that the pt not lift, push, or pull anything to  prevent the risk of pathologic fractures as the PET/CT does indicate bone involvement -Continue Xgeva every 4 weeks -Oxycodone for pain mx, up to every 6 hours -Recommended that the pt continue to eat well, drink at least 48-64 oz of water each day, and walk 20-30 minutes each day. -Will see the pt back in 3 weeks   PET/CT in 2 weeks Please schedule C5 of treatment per orders with labs and MD visit Continue Xgeva q4 weeks   All of the patients questions were answered with apparent satisfaction. The patient knows to call the clinic with any problems, questions or concerns.  The total time spent in the appt was 25 minutes and more than 50% was on counseling and direct patient cares.    Sullivan Lone MD MS AAHIVMS [facility] Hematology/Oncology Physician [facility]  (Office):       [phone] (Work cell):  [phone] (Fax):           [phone]  01/05/2019 9:48 AM  I, Baldwin Jamaica, am acting as a scribe for Dr. Sullivan Lone.   .I have reviewed the above documentation for accuracy and completeness, and I agree with the above. Brunetta Genera MD

## 2019-01-05 ENCOUNTER — Telehealth: Payer: Self-pay | Admitting: Hematology

## 2019-01-05 ENCOUNTER — Inpatient Hospital Stay: Payer: Medicaid Other | Attending: Hematology

## 2019-01-05 ENCOUNTER — Inpatient Hospital Stay: Payer: Medicaid Other

## 2019-01-05 ENCOUNTER — Inpatient Hospital Stay (HOSPITAL_BASED_OUTPATIENT_CLINIC_OR_DEPARTMENT_OTHER): Payer: Medicaid Other | Admitting: Hematology

## 2019-01-05 VITALS — BP 139/71 | HR 66 | Temp 98.7°F | Resp 18 | Ht 67.0 in | Wt 169.6 lb

## 2019-01-05 DIAGNOSIS — C7A1 Malignant poorly differentiated neuroendocrine tumors: Secondary | ICD-10-CM | POA: Insufficient documentation

## 2019-01-05 DIAGNOSIS — C801 Malignant (primary) neoplasm, unspecified: Secondary | ICD-10-CM

## 2019-01-05 DIAGNOSIS — D696 Thrombocytopenia, unspecified: Secondary | ICD-10-CM | POA: Diagnosis not present

## 2019-01-05 DIAGNOSIS — Z7189 Other specified counseling: Secondary | ICD-10-CM

## 2019-01-05 DIAGNOSIS — Z95828 Presence of other vascular implants and grafts: Secondary | ICD-10-CM

## 2019-01-05 DIAGNOSIS — N2889 Other specified disorders of kidney and ureter: Secondary | ICD-10-CM | POA: Diagnosis not present

## 2019-01-05 DIAGNOSIS — Z79899 Other long term (current) drug therapy: Secondary | ICD-10-CM | POA: Insufficient documentation

## 2019-01-05 DIAGNOSIS — Z5189 Encounter for other specified aftercare: Secondary | ICD-10-CM | POA: Diagnosis not present

## 2019-01-05 DIAGNOSIS — C787 Secondary malignant neoplasm of liver and intrahepatic bile duct: Secondary | ICD-10-CM

## 2019-01-05 DIAGNOSIS — C7B8 Other secondary neuroendocrine tumors: Secondary | ICD-10-CM | POA: Insufficient documentation

## 2019-01-05 DIAGNOSIS — C7951 Secondary malignant neoplasm of bone: Secondary | ICD-10-CM

## 2019-01-05 DIAGNOSIS — Z5111 Encounter for antineoplastic chemotherapy: Secondary | ICD-10-CM | POA: Insufficient documentation

## 2019-01-05 DIAGNOSIS — R945 Abnormal results of liver function studies: Secondary | ICD-10-CM | POA: Diagnosis not present

## 2019-01-05 LAB — CBC WITH DIFFERENTIAL/PLATELET
Abs Immature Granulocytes: 3.65 10*3/uL — ABNORMAL HIGH (ref 0.00–0.07)
Basophils Absolute: 0.1 10*3/uL (ref 0.0–0.1)
Basophils Relative: 1 %
Eosinophils Absolute: 0.1 10*3/uL (ref 0.0–0.5)
Eosinophils Relative: 1 %
HCT: 32.1 % — ABNORMAL LOW (ref 39.0–52.0)
Hemoglobin: 9.8 g/dL — ABNORMAL LOW (ref 13.0–17.0)
IMMATURE GRANULOCYTES: 17 %
Lymphocytes Relative: 10 %
Lymphs Abs: 2.1 10*3/uL (ref 0.7–4.0)
MCH: 28.6 pg (ref 26.0–34.0)
MCHC: 30.5 g/dL (ref 30.0–36.0)
MCV: 93.6 fL (ref 80.0–100.0)
Monocytes Absolute: 1.6 10*3/uL — ABNORMAL HIGH (ref 0.1–1.0)
Monocytes Relative: 8 %
Neutro Abs: 13.3 10*3/uL — ABNORMAL HIGH (ref 1.7–7.7)
Neutrophils Relative %: 63 %
Platelets: 123 10*3/uL — ABNORMAL LOW (ref 150–400)
RBC: 3.43 MIL/uL — ABNORMAL LOW (ref 4.22–5.81)
RDW: 18.8 % — ABNORMAL HIGH (ref 11.5–15.5)
WBC: 20.9 10*3/uL — AB (ref 4.0–10.5)
nRBC: 0.2 % (ref 0.0–0.2)

## 2019-01-05 LAB — CMP (CANCER CENTER ONLY)
ALT: 15 U/L (ref 0–44)
AST: 15 U/L (ref 15–41)
Albumin: 3.2 g/dL — ABNORMAL LOW (ref 3.5–5.0)
Alkaline Phosphatase: 111 U/L (ref 38–126)
Anion gap: 7 (ref 5–15)
BUN: 12 mg/dL (ref 6–20)
CO2: 29 mmol/L (ref 22–32)
Calcium: 8.2 mg/dL — ABNORMAL LOW (ref 8.9–10.3)
Chloride: 102 mmol/L (ref 98–111)
Creatinine: 0.74 mg/dL (ref 0.61–1.24)
GFR, Estimated: >60 mL/min (ref >60)
Glucose, Bld: 125 mg/dL — ABNORMAL HIGH (ref 70–99)
Potassium: 3.9 mmol/L (ref 3.5–5.1)
Sodium: 138 mmol/L (ref 135–145)
Total Bilirubin: 0.5 mg/dL (ref 0.3–1.2)
Total Protein: 6.6 g/dL (ref 6.5–8.1)

## 2019-01-05 LAB — MAGNESIUM: Magnesium: 1.8 mg/dL (ref 1.7–2.4)

## 2019-01-05 MED ORDER — SODIUM CHLORIDE 0.9 % IV SOLN
80.0000 mg/m2 | Freq: Once | INTRAVENOUS | Status: AC
  Administered 2019-01-05: 150 mg via INTRAVENOUS
  Filled 2019-01-05: qty 7.5

## 2019-01-05 MED ORDER — ACETAMINOPHEN 325 MG PO TABS
ORAL_TABLET | ORAL | Status: AC
  Filled 2019-01-05: qty 2

## 2019-01-05 MED ORDER — SODIUM CHLORIDE 0.9 % IV SOLN
500.0000 mg | Freq: Once | INTRAVENOUS | Status: AC
  Administered 2019-01-05: 500 mg via INTRAVENOUS
  Filled 2019-01-05: qty 50

## 2019-01-05 MED ORDER — HEPARIN SOD (PORK) LOCK FLUSH 100 UNIT/ML IV SOLN
500.0000 [IU] | Freq: Once | INTRAVENOUS | Status: AC | PRN
  Administered 2019-01-05: 500 [IU]
  Filled 2019-01-05: qty 5

## 2019-01-05 MED ORDER — PALONOSETRON HCL INJECTION 0.25 MG/5ML
INTRAVENOUS | Status: AC
  Filled 2019-01-05: qty 5

## 2019-01-05 MED ORDER — ACETAMINOPHEN 325 MG PO TABS
650.0000 mg | ORAL_TABLET | Freq: Once | ORAL | Status: AC
  Administered 2019-01-05: 650 mg via ORAL

## 2019-01-05 MED ORDER — DIPHENHYDRAMINE HCL 25 MG PO CAPS
25.0000 mg | ORAL_CAPSULE | Freq: Once | ORAL | Status: AC
  Administered 2019-01-05: 25 mg via ORAL

## 2019-01-05 MED ORDER — PALONOSETRON HCL INJECTION 0.25 MG/5ML
0.2500 mg | Freq: Once | INTRAVENOUS | Status: AC
  Administered 2019-01-05: 0.25 mg via INTRAVENOUS

## 2019-01-05 MED ORDER — SODIUM CHLORIDE 0.9% FLUSH
10.0000 mL | INTRAVENOUS | Status: DC | PRN
  Administered 2019-01-05: 10 mL
  Filled 2019-01-05: qty 10

## 2019-01-05 MED ORDER — SODIUM CHLORIDE 0.9 % IV SOLN
20.0000 mg | Freq: Once | INTRAVENOUS | Status: AC
  Administered 2019-01-05: 20 mg via INTRAVENOUS
  Filled 2019-01-05: qty 2

## 2019-01-05 MED ORDER — SODIUM CHLORIDE 0.9% FLUSH
10.0000 mL | INTRAVENOUS | Status: DC | PRN
  Filled 2019-01-05: qty 10

## 2019-01-05 MED ORDER — DIPHENHYDRAMINE HCL 25 MG PO CAPS
ORAL_CAPSULE | ORAL | Status: AC
  Filled 2019-01-05: qty 1

## 2019-01-05 MED ORDER — SODIUM CHLORIDE 0.9 % IV SOLN
Freq: Once | INTRAVENOUS | Status: AC
  Administered 2019-01-05: 11:00:00 via INTRAVENOUS
  Filled 2019-01-05: qty 5

## 2019-01-05 MED ORDER — SODIUM CHLORIDE 0.9 % IV SOLN
Freq: Once | INTRAVENOUS | Status: AC
  Administered 2019-01-05: 10:00:00 via INTRAVENOUS
  Filled 2019-01-05: qty 250

## 2019-01-05 NOTE — Telephone Encounter (Signed)
Scheduled appt per 3/4 los.  Printed calendar and avs.

## 2019-01-05 NOTE — Patient Instructions (Signed)
[place] Discharge Instructions for Patients Receiving Chemotherapy  Today you received the following chemotherapy agents:  Etoposide, Carboplatin  To help prevent nausea and vomiting after your treatment, we encourage you to take your nausea medication as prescribed.   If you develop nausea and vomiting that is not controlled by your nausea medication, call the clinic.   BELOW ARE SYMPTOMS THAT SHOULD BE REPORTED IMMEDIATELY:  *FEVER GREATER THAN 100.5 F  *CHILLS WITH OR WITHOUT FEVER  NAUSEA AND VOMITING THAT IS NOT CONTROLLED WITH YOUR NAUSEA MEDICATION  *UNUSUAL SHORTNESS OF BREATH  *UNUSUAL BRUISING OR BLEEDING  TENDERNESS IN MOUTH AND THROAT WITH OR WITHOUT PRESENCE OF ULCERS  *URINARY PROBLEMS  *BOWEL PROBLEMS  UNUSUAL RASH Items with * indicate a potential emergency and should be followed up as soon as possible.  Feel free to call the clinic should you have any questions or concerns. The clinic phone number is [phone].  Please show the [place] at check-in to the Emergency Department and triage nurse.

## 2019-01-06 ENCOUNTER — Inpatient Hospital Stay: Payer: Medicaid Other

## 2019-01-06 VITALS — BP 138/67 | HR 53 | Temp 97.7°F | Resp 18

## 2019-01-06 DIAGNOSIS — Z5111 Encounter for antineoplastic chemotherapy: Secondary | ICD-10-CM | POA: Diagnosis not present

## 2019-01-06 DIAGNOSIS — Z7189 Other specified counseling: Secondary | ICD-10-CM

## 2019-01-06 DIAGNOSIS — C801 Malignant (primary) neoplasm, unspecified: Principal | ICD-10-CM

## 2019-01-06 DIAGNOSIS — C787 Secondary malignant neoplasm of liver and intrahepatic bile duct: Secondary | ICD-10-CM

## 2019-01-06 MED ORDER — SODIUM CHLORIDE 0.9% FLUSH
10.0000 mL | INTRAVENOUS | Status: DC | PRN
  Administered 2019-01-06: 10 mL
  Filled 2019-01-06: qty 10

## 2019-01-06 MED ORDER — DEXAMETHASONE SODIUM PHOSPHATE 10 MG/ML IJ SOLN
10.0000 mg | Freq: Once | INTRAMUSCULAR | Status: AC
  Administered 2019-01-06: 10 mg via INTRAVENOUS

## 2019-01-06 MED ORDER — DEXAMETHASONE SODIUM PHOSPHATE 10 MG/ML IJ SOLN
INTRAMUSCULAR | Status: AC
  Filled 2019-01-06: qty 1

## 2019-01-06 MED ORDER — HEPARIN SOD (PORK) LOCK FLUSH 100 UNIT/ML IV SOLN
500.0000 [IU] | Freq: Once | INTRAVENOUS | Status: AC | PRN
  Administered 2019-01-06: 500 [IU]
  Filled 2019-01-06: qty 5

## 2019-01-06 MED ORDER — SODIUM CHLORIDE 0.9 % IV SOLN
Freq: Once | INTRAVENOUS | Status: AC
  Administered 2019-01-06: 09:00:00 via INTRAVENOUS
  Filled 2019-01-06: qty 250

## 2019-01-06 MED ORDER — SODIUM CHLORIDE 0.9 % IV SOLN
80.0000 mg/m2 | Freq: Once | INTRAVENOUS | Status: AC
  Administered 2019-01-06: 150 mg via INTRAVENOUS
  Filled 2019-01-06: qty 7.5

## 2019-01-06 NOTE — Patient Instructions (Signed)
[facility] Discharge Instructions for Patients Receiving Chemotherapy  Today you received the following chemotherapy agents:  Etoposide  To help prevent nausea and vomiting after your treatment, we encourage you to take your nausea medication as prescibed   If you develop nausea and vomiting that is not controlled by your nausea medication, call the clinic.   BELOW ARE SYMPTOMS THAT SHOULD BE REPORTED IMMEDIATELY:  *FEVER GREATER THAN 100.5 F  *CHILLS WITH OR WITHOUT FEVER  NAUSEA AND VOMITING THAT IS NOT CONTROLLED WITH YOUR NAUSEA MEDICATION  *UNUSUAL SHORTNESS OF BREATH  *UNUSUAL BRUISING OR BLEEDING  TENDERNESS IN MOUTH AND THROAT WITH OR WITHOUT PRESENCE OF ULCERS  *URINARY PROBLEMS  *BOWEL PROBLEMS  UNUSUAL RASH Items with * indicate a potential emergency and should be followed up as soon as possible.  Feel free to call the clinic should you have any questions or concerns. The clinic phone number is [phone].  Please show the [place] at check-in to the Emergency Department and triage nurse.

## 2019-01-07 ENCOUNTER — Inpatient Hospital Stay: Payer: Medicaid Other

## 2019-01-07 ENCOUNTER — Other Ambulatory Visit: Payer: Self-pay | Admitting: *Deleted

## 2019-01-07 VITALS — BP 147/75 | HR 70 | Temp 98.7°F | Resp 18

## 2019-01-07 DIAGNOSIS — C7951 Secondary malignant neoplasm of bone: Secondary | ICD-10-CM

## 2019-01-07 DIAGNOSIS — Z7189 Other specified counseling: Secondary | ICD-10-CM

## 2019-01-07 DIAGNOSIS — Z5111 Encounter for antineoplastic chemotherapy: Secondary | ICD-10-CM | POA: Diagnosis not present

## 2019-01-07 DIAGNOSIS — C787 Secondary malignant neoplasm of liver and intrahepatic bile duct: Secondary | ICD-10-CM

## 2019-01-07 DIAGNOSIS — C801 Malignant (primary) neoplasm, unspecified: Principal | ICD-10-CM

## 2019-01-07 DIAGNOSIS — Z95828 Presence of other vascular implants and grafts: Secondary | ICD-10-CM

## 2019-01-07 MED ORDER — SODIUM CHLORIDE 0.9 % IV SOLN
Freq: Once | INTRAVENOUS | Status: AC
  Administered 2019-01-07: 10:00:00 via INTRAVENOUS
  Filled 2019-01-07: qty 250

## 2019-01-07 MED ORDER — DEXAMETHASONE SODIUM PHOSPHATE 10 MG/ML IJ SOLN
INTRAMUSCULAR | Status: AC
  Filled 2019-01-07: qty 1

## 2019-01-07 MED ORDER — DEXAMETHASONE SODIUM PHOSPHATE 10 MG/ML IJ SOLN
10.0000 mg | Freq: Once | INTRAMUSCULAR | Status: AC
  Administered 2019-01-07: 10 mg via INTRAVENOUS

## 2019-01-07 MED ORDER — SODIUM CHLORIDE 0.9 % IV SOLN
80.0000 mg/m2 | Freq: Once | INTRAVENOUS | Status: AC
  Administered 2019-01-07: 150 mg via INTRAVENOUS
  Filled 2019-01-07: qty 7.5

## 2019-01-07 MED ORDER — SODIUM CHLORIDE 0.9% FLUSH
10.0000 mL | INTRAVENOUS | Status: DC | PRN
  Administered 2019-01-07: 10 mL
  Filled 2019-01-07: qty 10

## 2019-01-07 MED ORDER — HEPARIN SOD (PORK) LOCK FLUSH 100 UNIT/ML IV SOLN
500.0000 [IU] | Freq: Once | INTRAVENOUS | Status: AC | PRN
  Administered 2019-01-07: 500 [IU]
  Filled 2019-01-07: qty 5

## 2019-01-07 NOTE — Progress Notes (Signed)
Pt prefers to receive Xgeva injection with Neulasta on Saturday  01/08/2019 . Per Burman Nieves, pharmacist,  [place] to give with labs done 01/05/2019.

## 2019-01-07 NOTE — Patient Instructions (Signed)
[place] Discharge Instructions for Patients Receiving Chemotherapy  Today you received the following chemotherapy agents :  Etoposide.  To help prevent nausea and vomiting after your treatment, we encourage you to take your nausea medication as prescribed.   If you develop nausea and vomiting that is not controlled by your nausea medication, call the clinic.   BELOW ARE SYMPTOMS THAT SHOULD BE REPORTED IMMEDIATELY:  *FEVER GREATER THAN 100.5 F  *CHILLS WITH OR WITHOUT FEVER  NAUSEA AND VOMITING THAT IS NOT CONTROLLED WITH YOUR NAUSEA MEDICATION  *UNUSUAL SHORTNESS OF BREATH  *UNUSUAL BRUISING OR BLEEDING  TENDERNESS IN MOUTH AND THROAT WITH OR WITHOUT PRESENCE OF ULCERS  *URINARY PROBLEMS  *BOWEL PROBLEMS  UNUSUAL RASH Items with * indicate a potential emergency and should be followed up as soon as possible.  Feel free to call the clinic should you have any questions or concerns. The clinic phone number is [phone].  Please show the [place] at check-in to the Emergency Department and triage nurse.

## 2019-01-07 NOTE — Progress Notes (Signed)
Okay to give Xgeva today (3/6) w/ borderline low corrected calcium today per MD. Patient being educated to take calcium supplements at home while one Xgeva.  Demetrius Charity, PharmD, [place] Oncology Pharmacist Pharmacy Phone: [phone] 01/07/2019

## 2019-01-08 ENCOUNTER — Inpatient Hospital Stay: Payer: Medicaid Other

## 2019-01-08 VITALS — BP 142/61 | HR 76 | Temp 97.9°F | Resp 17

## 2019-01-08 DIAGNOSIS — Z7189 Other specified counseling: Secondary | ICD-10-CM

## 2019-01-08 DIAGNOSIS — Z5111 Encounter for antineoplastic chemotherapy: Secondary | ICD-10-CM | POA: Diagnosis not present

## 2019-01-08 DIAGNOSIS — C787 Secondary malignant neoplasm of liver and intrahepatic bile duct: Secondary | ICD-10-CM

## 2019-01-08 DIAGNOSIS — C801 Malignant (primary) neoplasm, unspecified: Principal | ICD-10-CM

## 2019-01-08 MED ORDER — PEGFILGRASTIM INJECTION 6 MG/0.6ML ~~LOC~~
PREFILLED_SYRINGE | SUBCUTANEOUS | Status: AC
  Filled 2019-01-08: qty 0.6

## 2019-01-08 MED ORDER — DENOSUMAB 120 MG/1.7ML ~~LOC~~ SOLN
SUBCUTANEOUS | Status: AC
  Filled 2019-01-08: qty 1.7

## 2019-01-08 MED ORDER — PEGFILGRASTIM INJECTION 6 MG/0.6ML ~~LOC~~
6.0000 mg | PREFILLED_SYRINGE | Freq: Once | SUBCUTANEOUS | Status: AC
  Administered 2019-01-08: 6 mg via SUBCUTANEOUS

## 2019-01-12 ENCOUNTER — Inpatient Hospital Stay: Payer: Medicaid Other

## 2019-01-12 ENCOUNTER — Other Ambulatory Visit: Payer: Self-pay

## 2019-01-12 DIAGNOSIS — C7951 Secondary malignant neoplasm of bone: Secondary | ICD-10-CM

## 2019-01-12 DIAGNOSIS — C801 Malignant (primary) neoplasm, unspecified: Secondary | ICD-10-CM

## 2019-01-12 DIAGNOSIS — Z5111 Encounter for antineoplastic chemotherapy: Secondary | ICD-10-CM | POA: Diagnosis not present

## 2019-01-12 DIAGNOSIS — Z7189 Other specified counseling: Secondary | ICD-10-CM

## 2019-01-12 DIAGNOSIS — C787 Secondary malignant neoplasm of liver and intrahepatic bile duct: Secondary | ICD-10-CM

## 2019-01-12 DIAGNOSIS — Z95828 Presence of other vascular implants and grafts: Secondary | ICD-10-CM

## 2019-01-12 MED ORDER — DENOSUMAB 120 MG/1.7ML ~~LOC~~ SOLN
120.0000 mg | Freq: Once | SUBCUTANEOUS | Status: AC
  Administered 2019-01-12: 120 mg via SUBCUTANEOUS

## 2019-01-12 MED ORDER — DENOSUMAB 120 MG/1.7ML ~~LOC~~ SOLN: 120.0000 mg | Freq: Once | SUBCUTANEOUS | Status: AC

## 2019-01-12 MED ORDER — DENOSUMAB 120 MG/1.7ML ~~LOC~~ SOLN
SUBCUTANEOUS | Status: AC
  Filled 2019-01-12: qty 1.7

## 2019-01-12 NOTE — Addendum Note (Signed)
Addended by: Lenox Ponds E on: 01/12/2019 04:07 PM   Modules accepted: Orders

## 2019-01-12 NOTE — Patient Instructions (Signed)

## 2019-01-14 ENCOUNTER — Other Ambulatory Visit: Payer: Self-pay | Admitting: Hematology

## 2019-01-14 ENCOUNTER — Encounter: Payer: Self-pay | Admitting: Hematology

## 2019-01-14 NOTE — Telephone Encounter (Signed)
Wife called - husband needs refill of oxycodone

## 2019-01-15 ENCOUNTER — Other Ambulatory Visit: Payer: Self-pay | Admitting: Hematology

## 2019-01-17 MED ORDER — OXYCODONE HCL 5 MG PO TABS: 5.0000 mg | ORAL_TABLET | ORAL | 0 refills | Status: DC | PRN

## 2019-01-19 ENCOUNTER — Ambulatory Visit (HOSPITAL_COMMUNITY): Payer: Medicaid Other

## 2019-01-25 NOTE — Progress Notes (Signed)
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  HISTORY OF PRESENTING ILLNESS:   [name] is a wonderful 58 y.o. male who has been referred to [place] by Dr. Francine Graven for evaluation and management of Liver masses. He is accompanied today by his wife and aunt. The pt reports that he is doing well overall.   The pt presented to the ED on 10/01/18 regarding persistent RUQ abdominal pain which began a month prior, and noted associations with nausea and 40 pound weight loss. He was evaluated with a CT A/P, as noted below, which revealed liver masses.   The pt reports that he first began feeling RUQ abdominal pain about 3-4 weeks ago, but began losing weight about 2 months ago. He notes that he began feeling more fatigue about 2 months ago as well.  The pt notes that the color of his bowels became dark black and denies taking Iron pills or bismuth medications. He notes that he began feeling constipated as well, feeling backed up. He notes that his stools began looking thinner in caliber. The pt notes that his appetite began decreasing over the last 3-4 weeks ago, and some of this he attributes to intermittent nausea. He denies problems swallowing food. He endorses RUQ pain, and denies vomiting and new cough, new bone pains, testicular pain or swelling, problems passing urine, blood in the urine. The pt has never had a colonoscopy and does not have a PCP, and cites his lack of health insurance as the reason.  The pt was released from the ED with Oxycodone and has been using this to treat his RUQ pain successfully.   The pt notes gout and a heart murmur as previous medical history. He notes that he hasn't consumed alcohol in the past 2 years whatsoever, but prior to this alcohol was  a problem for him.   Of note prior to the patient's visit today, pt has had a CT A/P completed on 10/01/18 with results revealing Interval development of numerous ill-defined hypodense masses throughout the entire liver compatible with metastatic disease. There is a 2.5 cm enhancing mass over the upper pole cortex of the right kidney suspicious for renal cell carcinoma. 2.  No acute findings in the abdomen/pelvis. 3. 1 cm hypodensity over the pancreatic tail not well seen on previous noncontrast exams. MR may be helpful for further evaluation on an elective basis.   Most recent lab results (10/06/18) of CBC w/diff and CMP is as follows: all values are WNL except for Monocytes abs at 1.2k, Sodium at 134, Albumin at 3.0, AST at 77, ALT at 63, Alk Phos at 392, Total Bilirubin at 2.3   On review of systems, pt reports new constipation, smaller caliber stools, dark stools, unexpected weight loss, RUQ pain, new fatigue, and denies vomiting, cough, new SOB, changes in breathing, new bone pains, leg swelling, blood in the urine, changes in urination, fevers, pain along the spine, and any other symptoms.   On PMHx the pt reports Gout, heart murmur, previous alcohol abuse. He denies any surgeries.  On Social Hx the pt reports that he hasn't consumed alcohol in the past 2 years whatsoever. He notes that alcohol abuse was previously a concern. He denies ever smoking cigarettes or doing drugs. He denies any radiation or chemical exposure.  On Family  Hx the pt reports mother with pancreatic cancer in 38s, maternal aunts with high blood pressure and heart failure, maternal aunt with breast cancer.  Interval History:  [name] returns today for management, evaluation, prior to C5 treatment of his newly diagnosed High grade neuroendocrine carcinoma. The patient's last visit with [place] was on 01/05/19. The pt reports that he is doing well overall.   The pt reports that his right hand swelled a few days ago and is  currently swollen and tender. He notes that he has not been taking Allopurinol. The pt denies any other joint pains or swellings.  The pt notes that he has been avoiding crowds and has been staying at home to avoid individuals with infections.  The pt notes that he has been eating very well and has been gaining back some of the weight which he lost prior to diagnosis.   Lab results today (01/26/19) of CBC w/diff, Reticulocytes, and CMP is as follows: all values are WNL except for WBC at 17.1k, RBC at 3.65, HGB at 10.3, HCT at 33.3, RDW at 16.6, ANC at 11.0k, Monocytes abs at 1.6k, Abs immature granulocytes at 2.56k, Immature retic fract at 25.5%, Glucose at 147, Calcium at 8.7, Albumin at 3.4, AST at 14. 01/26/19 LDH is pending.  On review of systems, pt reports good energy levels, right hand swelling and tenderness, eating well, weight gain, and denies problems with the port, abdominal pains, leg swelling, and any other symptoms.   MEDICAL HISTORY:  Past Medical History:  Diagnosis Date   Arthritis    "knees; left hand"   Cancer ([place])    Gout    Heart murmur    Longstanding. Mild aortic valve thickening with trivial AR by echo 02/2012   Hypertension    Near syncope    05/2011 with tachypalpitations & with echo showing mild LVH, normal EF 55-60%, trivial AR    SURGICAL HISTORY: Past Surgical History:  Procedure Laterality Date   IR IMAGING GUIDED PORT INSERTION  10/26/2018   NO PAST SURGERIES      SOCIAL HISTORY: Social History   Socioeconomic History   Marital status: Married    Spouse name: Not on file   Number of children: 1   Years of education: Not on file   Highest education level: Not on file  Occupational History   Occupation: Unemployed  Scientist, product/process development strain: Not on file   Food insecurity:    Worry: Not on file    Inability: Not on file   Transportation needs:    Medical: Not on file    Non-medical: Not on file  Tobacco Use     Smoking status: Never Smoker   Smokeless tobacco: Never Used  Substance and Sexual Activity   Alcohol use: Not Currently    Comment: states he has cut back/ every now and again   Drug use: No   Sexual activity: Yes    Partners: Female  Lifestyle   Physical activity:    Days per week: Not on file    Minutes per session: Not on file   Stress: Not on file  Relationships   Social connections:    Talks on phone: Not on file    Gets together: Not on file    Attends religious service: Not on file    Active member of club or organization: Not on file    Attends meetings of clubs or organizations: Not on file    Relationship status:  Not on file   Intimate partner violence:    Fear of current or ex partner: Not on file    Emotionally abused: Not on file    Physically abused: Not on file    Forced sexual activity: Not on file  Other Topics Concern   Not on file  Social History Narrative   Married. 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 Is from [place].     FAMILY HISTORY: Family History  Problem Relation Age of Onset   Cancer Mother        Possibly pancreatic. Died at age 42   Prostate cancer Father    Breast cancer Cousin    Diabetes Paternal Uncle    Heart disease Paternal Uncle     ALLERGIES:  has No Known Allergies.  MEDICATIONS:  Current Outpatient Medications  Medication Sig Dispense Refill   allopurinol (ZYLOPRIM) 100 MG tablet Take 2 tablets (200 mg total) by mouth daily. 60 tablet 1   clindamycin (CLEOCIN) 300 MG capsule Take 1 capsule (300 mg total) by mouth 4 (four) times daily. X 7 days 28 capsule 0   colchicine 0.6 MG tablet Take 1 tablet (0.6 mg total) by mouth See admin instructions. Take 1.81m (2 tablets) followed by .6656m(1 tablet) 1 hour later. (Patient not taking: Reported on 12/11/2018) 3 tablet 0   dexamethasone (DECADRON) 4 MG tablet 56m72mo daily with breakfast for 2 weeks then every other  day for 2 weeks 30 tablet 0   furosemide (LASIX) 20 MG tablet Take 1 tablet (20 mg total) by mouth daily. 30 tablet 1   lidocaine-prilocaine (EMLA) cream Apply to affected area once (Patient not taking: Reported on 12/11/2018) 30 g 3   LORazepam (ATIVAN) 0.5 MG tablet Take 1 tablet (0.5 mg total) by mouth every 6 (six) hours as needed (Nausea or vomiting). 30 tablet 0   ondansetron (ZOFRAN ODT) 4 MG disintegrating tablet Take 1 tablet (4 mg total) by mouth every 8 (eight) hours as needed for nausea or vomiting. 6 tablet 0   oxyCODONE (OXY IR/ROXICODONE) 5 MG immediate release tablet Take 1-2 tablets (5-10 mg total) by mouth every 4 (four) hours as needed for severe pain. 90 tablet 0   potassium chloride SA (K-DUR,KLOR-CON) 20 MEQ tablet Take 1 tablet (20 mEq total) by mouth daily. With lasix 30 tablet 1   predniSONE (DELTASONE) 20 MG tablet Take 2 tablets (40 mg total) by mouth daily with breakfast for 5 days. 10 tablet 0   prochlorperazine (COMPAZINE) 10 MG tablet Take 1 tablet (10 mg total) by mouth every 6 (six) hours as needed (Nausea or vomiting). 30 tablet 1   No current facility-administered medications for this visit.    Facility-Administered Medications Ordered in Other Visits  Medication Dose Route Frequency Provider Last Rate Last Dose   denosumab (XGEVA) injection 120 mg  120 mg Subcutaneous Once KalIrene LimboauCloria SpringD        REVIEW OF SYSTEMS:    A 10+ POINT REVIEW OF SYSTEMS WAS OBTAINED including neurology, dermatology, psychiatry, cardiac, respiratory, lymph, extremities, GI, GU, Musculoskeletal, constitutional, breasts, reproductive, HEENT.  All pertinent positives are noted in the HPI.  All others are negative.   PHYSICAL EXAMINATION: ECOG PERFORMANCE STATUS: 1 - Symptomatic but completely ambulatory  Vitals:   01/26/19 0934  BP: (!) 168/78  Pulse: 71  Resp: 18  Temp: 98.1 F (36.7 C)  SpO2: 100%   Filed Weights   01/26/19 0934  Weight: 177 lb 4.8 oz  (80.4 kg)   .Body mass index is 27.77 kg/m.   GENERAL:alert, in no acute distress and comfortable SKIN: no acute rashes, no significant lesions EYES: conjunctiva are pink and non-injected, sclera anicteric OROPHARYNX: MMM, no exudates, no oropharyngeal erythema or ulceration NECK: supple, no JVD LYMPH:  no palpable lymphadenopathy in the cervical, axillary or inguinal regions LUNGS: clear to auscultation b/l with normal respiratory effort HEART: regular rate & rhythm ABDOMEN:  normoactive bowel sounds, not tender, not distended. Improving hepatomegaly, now palpable 3cm under right mid-costal margin line. Extremity: no pedal edema PSYCH: alert & oriented x 3 with fluent speech NEURO: no focal motor/sensory deficits   LABORATORY DATA:  I have reviewed the data as listed  . CBC Latest Ref Rng & Units 01/26/2019 01/05/2019 12/24/2018  WBC 4.0 - 10.5 K/uL 17.1(H) 20.9(H) 23.5(H)  Hemoglobin 13.0 - 17.0 g/dL 10.3(L) 9.8(L) 9.0(L)  Hematocrit 39.0 - 52.0 % 33.3(L) 32.1(L) 28.5(L)  Platelets 150 - 400 K/uL 166 123(L) 114(L)    . CMP Latest Ref Rng & Units 01/26/2019 01/05/2019 12/24/2018  Glucose 70 - 99 mg/dL 147(H) 125(H) 124(H)  BUN 6 - 20 mg/dL '10 12 10  ' Creatinine 0.61 - 1.24 mg/dL 0.77 0.74 0.67  Sodium 135 - 145 mmol/L 142 138 141  Potassium 3.5 - 5.1 mmol/L 3.8 3.9 3.9  Chloride 98 - 111 mmol/L 105 102 106  CO2 22 - 32 mmol/L '28 29 26  ' Calcium 8.9 - 10.3 mg/dL 8.7(L) 8.2(L) 8.1(L)  Total Protein 6.5 - 8.1 g/dL 6.8 6.6 6.6  Total Bilirubin 0.3 - 1.2 mg/dL 0.4 0.5 0.4  Alkaline Phos 38 - 126 U/L 111 111 207(H)  AST 15 - 41 U/L 14(L) 15 14(L)  ALT 0 - 44 U/L '14 15 14    ' 10/08/18 Liver Biopsy:    RADIOGRAPHIC STUDIES: I have personally reviewed the radiological images as listed and agreed with the findings in the report. No results found.  ASSESSMENT & PLAN:  58 y.o. male with  1. Metastatic High Grade Neuroendocrine Carcinoma with Multiple liver masses  10/01/18 CT A/P  revealed Interval development of numerous ill-defined hypodense masses throughout the entire liver compatible with metastatic disease. There is a 2.5 cm enhancing mass over the upper pole cortex of the right kidney suspicious for renal cell carcinoma. 2.  No acute findings in the abdomen/pelvis. 3. 1 cm hypodensity over the pancreatic tail not well seen on previous noncontrast exams. MR may be helpful for further evaluation on an elective basis.    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 2. Upper pole right renal lesion is not definitely hypermetabolic and has been present since 2012. I think is unlikely the cause of the metastatic disease. Recommend liver biopsy for tissue diagnosis. If needed, an MRI of the abdomen without and with contrast may be helpful for further evaluation of the renal lesion. 3. Two sub 3 mm right upper lobe pulmonary nodules are indeterminate.   10/26/18 MRI Brain revealed No acute abnormality and negative for metastatic disease.  2. Abnormal LFTs - likely from liver metastases  Labs upon initial presentation from 10/06/18, some borderline monocytosis at 1.2k, otherwise normal blood counts, AST at 77, ALT  at 63, Alk Phos at 392, Total Bilirubin at 2.3   3. Rt renal mass - concerning for RCC  PLAN:  -Discussed pt labwork today, 01/26/19; HGB improved to 10.3, PLT normalized to 166k, ANC at 11.0k in setting of G-CSF support -The pt has no prohibitive toxicities from continuing C5 Carboplatin and 20% dose reduced Etoposide, at this time. -Begin 68m Prednisone for 5 days, and resume 2034mAllopurinol for gout in right hand. Also receiving IV dexamethasone with treatment. -Previously  ordered PET/CT for completion at end of C4 to evaluate treatment efficacy, which was denied. Will now evaluate treatment efficacy after concluding C6 treatment. -Regarding the patient's C1 thrombocytopenia, did decrease dose of Etoposide by 20% for C2 treatment -Continue with compression socks and leg elevation  -Lasix as needed, with Potassium replacement  -Continue high protein and high calorie meal supplements such as Boost or Ensure -Plan for Carboplatin and Etoposide every 3 weeks, with G-CSF support for up to 6 cycles -Strongly recommended that the pt not lift, push, or pull anything to prevent the risk of pathologic fractures as the PET/CT does indicate bone involvement -Continue Xgeva every 4 weeks -Oxycodone for pain mx, up to every 6 hours -Recommended that the pt continue to eat well, drink at least 48-64 oz of water each day, and walk 20-30 minutes each day. -Will see the pt back with C6   Please schedule C6 of chemotherapy as ordered with labs and MD visit   All of the patients questions were answered with apparent satisfaction. The patient knows to call the clinic with any problems, questions or concerns.  The total time spent in the appt was 25 minutes and more than 50% was on counseling and direct patient cares.    GaSullivan LoneD MS AAHIVMS [facility] Physiat[facility] Centerematology/Oncology Physician [facility](Office):       [phone] cell):  [phone]):           [phone]/25/2020 10:22 AM  I, ScBaldwin Jamaicaam acting as a scribe for Dr. GaSullivan Lone  .I have reviewed the above documentation for accuracy and completeness, and I agree with the above. .GBrunetta GeneraD

## 2019-01-26 ENCOUNTER — Other Ambulatory Visit: Payer: Self-pay

## 2019-01-26 ENCOUNTER — Telehealth: Payer: Self-pay | Admitting: Hematology

## 2019-01-26 ENCOUNTER — Inpatient Hospital Stay: Payer: Medicaid Other

## 2019-01-26 ENCOUNTER — Inpatient Hospital Stay (HOSPITAL_BASED_OUTPATIENT_CLINIC_OR_DEPARTMENT_OTHER): Payer: Medicaid Other | Admitting: Hematology

## 2019-01-26 VITALS — BP 168/78 | HR 71 | Temp 98.1°F | Resp 18 | Ht 67.0 in | Wt 177.3 lb

## 2019-01-26 DIAGNOSIS — Z5111 Encounter for antineoplastic chemotherapy: Secondary | ICD-10-CM | POA: Diagnosis not present

## 2019-01-26 DIAGNOSIS — C801 Malignant (primary) neoplasm, unspecified: Principal | ICD-10-CM

## 2019-01-26 DIAGNOSIS — C7951 Secondary malignant neoplasm of bone: Secondary | ICD-10-CM

## 2019-01-26 DIAGNOSIS — C7B8 Other secondary neuroendocrine tumors: Secondary | ICD-10-CM | POA: Diagnosis not present

## 2019-01-26 DIAGNOSIS — C7A1 Malignant poorly differentiated neuroendocrine tumors: Secondary | ICD-10-CM | POA: Diagnosis not present

## 2019-01-26 DIAGNOSIS — R945 Abnormal results of liver function studies: Secondary | ICD-10-CM

## 2019-01-26 DIAGNOSIS — C787 Secondary malignant neoplasm of liver and intrahepatic bile duct: Secondary | ICD-10-CM

## 2019-01-26 DIAGNOSIS — M109 Gout, unspecified: Secondary | ICD-10-CM

## 2019-01-26 DIAGNOSIS — Z7189 Other specified counseling: Secondary | ICD-10-CM

## 2019-01-26 DIAGNOSIS — Z95828 Presence of other vascular implants and grafts: Secondary | ICD-10-CM

## 2019-01-26 DIAGNOSIS — D696 Thrombocytopenia, unspecified: Secondary | ICD-10-CM

## 2019-01-26 DIAGNOSIS — N2889 Other specified disorders of kidney and ureter: Secondary | ICD-10-CM | POA: Diagnosis not present

## 2019-01-26 DIAGNOSIS — Z79899 Other long term (current) drug therapy: Secondary | ICD-10-CM

## 2019-01-26 LAB — CMP (CANCER CENTER ONLY)
ALT: 14 U/L (ref 0–44)
AST: 14 U/L — ABNORMAL LOW (ref 15–41)
Albumin: 3.4 g/dL — ABNORMAL LOW (ref 3.5–5.0)
Alkaline Phosphatase: 111 U/L (ref 38–126)
Anion gap: 9 (ref 5–15)
BUN: 10 mg/dL (ref 6–20)
CO2: 28 mmol/L (ref 22–32)
Calcium: 8.7 mg/dL — ABNORMAL LOW (ref 8.9–10.3)
Chloride: 105 mmol/L (ref 98–111)
Creatinine: 0.77 mg/dL (ref 0.61–1.24)
GFR, Est AFR Am: >60 mL/min (ref >60)
GFR, Estimated: >60 mL/min (ref >60)
Glucose, Bld: 147 mg/dL — ABNORMAL HIGH (ref 70–99)
Potassium: 3.8 mmol/L (ref 3.5–5.1)
Sodium: 142 mmol/L (ref 135–145)
Total Bilirubin: 0.4 mg/dL (ref 0.3–1.2)
Total Protein: 6.8 g/dL (ref 6.5–8.1)

## 2019-01-26 LAB — RETICULOCYTES
Immature Retic Fract: 25.5 % — ABNORMAL HIGH (ref 2.3–15.9)
RBC.: 3.55 MIL/uL — ABNORMAL LOW (ref 4.22–5.81)
Retic Count, Absolute: 92.7 10*3/uL (ref 19.0–186.0)
Retic Ct Pct: 2.6 % (ref 0.4–3.1)

## 2019-01-26 LAB — CBC WITH DIFFERENTIAL/PLATELET
Abs Immature Granulocytes: 2.56 10*3/uL — ABNORMAL HIGH (ref 0.00–0.07)
BASOS ABS: 0 10*3/uL (ref 0.0–0.1)
Basophils Relative: 0 %
Eosinophils Absolute: 0.1 10*3/uL (ref 0.0–0.5)
Eosinophils Relative: 1 %
HCT: 33.3 % — ABNORMAL LOW (ref 39.0–52.0)
Hemoglobin: 10.3 g/dL — ABNORMAL LOW (ref 13.0–17.0)
Immature Granulocytes: 15 %
Lymphocytes Relative: 11 %
Lymphs Abs: 1.8 10*3/uL (ref 0.7–4.0)
MCH: 28.2 pg (ref 26.0–34.0)
MCHC: 30.9 g/dL (ref 30.0–36.0)
MCV: 91.2 fL (ref 80.0–100.0)
Monocytes Absolute: 1.6 10*3/uL — ABNORMAL HIGH (ref 0.1–1.0)
Monocytes Relative: 9 %
NEUTROS ABS: 11 10*3/uL — AB (ref 1.7–7.7)
Neutrophils Relative %: 64 %
Platelets: 166 10*3/uL (ref 150–400)
RBC: 3.65 MIL/uL — ABNORMAL LOW (ref 4.22–5.81)
RDW: 16.6 % — ABNORMAL HIGH (ref 11.5–15.5)
WBC: 17.1 10*3/uL — ABNORMAL HIGH (ref 4.0–10.5)
nRBC: 0 % (ref 0.0–0.2)

## 2019-01-26 LAB — LACTATE DEHYDROGENASE: LDH: 265 U/L — ABNORMAL HIGH (ref 98–192)

## 2019-01-26 MED ORDER — SODIUM CHLORIDE 0.9 % IV SOLN
80.0000 mg/m2 | Freq: Once | INTRAVENOUS | Status: AC
  Administered 2019-01-26: 150 mg via INTRAVENOUS
  Filled 2019-01-26: qty 7.5

## 2019-01-26 MED ORDER — SODIUM CHLORIDE 0.9% FLUSH
10.0000 mL | INTRAVENOUS | Status: DC | PRN
  Administered 2019-01-26: 10 mL
  Filled 2019-01-26: qty 10

## 2019-01-26 MED ORDER — PALONOSETRON HCL INJECTION 0.25 MG/5ML
0.2500 mg | Freq: Once | INTRAVENOUS | Status: AC
  Administered 2019-01-26: 0.25 mg via INTRAVENOUS

## 2019-01-26 MED ORDER — SODIUM CHLORIDE 0.9 % IV SOLN
Freq: Once | INTRAVENOUS | Status: AC
  Administered 2019-01-26: 12:00:00 via INTRAVENOUS
  Filled 2019-01-26: qty 5

## 2019-01-26 MED ORDER — HEPARIN SOD (PORK) LOCK FLUSH 100 UNIT/ML IV SOLN
500.0000 [IU] | Freq: Once | INTRAVENOUS | Status: AC | PRN
  Administered 2019-01-26: 500 [IU]
  Filled 2019-01-26: qty 5

## 2019-01-26 MED ORDER — PALONOSETRON HCL INJECTION 0.25 MG/5ML
INTRAVENOUS | Status: AC
  Filled 2019-01-26: qty 5

## 2019-01-26 MED ORDER — DIPHENHYDRAMINE HCL 25 MG PO CAPS
ORAL_CAPSULE | ORAL | Status: AC
  Filled 2019-01-26: qty 1

## 2019-01-26 MED ORDER — SODIUM CHLORIDE 0.9 % IV SOLN
550.0000 mg | Freq: Once | INTRAVENOUS | Status: AC
  Administered 2019-01-26: 550 mg via INTRAVENOUS
  Filled 2019-01-26: qty 55

## 2019-01-26 MED ORDER — ALLOPURINOL 100 MG PO TABS: 200.0000 mg | ORAL_TABLET | Freq: Every day | ORAL | 1 refills | Status: DC

## 2019-01-26 MED ORDER — FAMOTIDINE IN NACL 20-0.9 MG/50ML-% IV SOLN: 20.0000 mg | Freq: Once | INTRAVENOUS | Status: DC

## 2019-01-26 MED ORDER — SODIUM CHLORIDE 0.9 % IV SOLN
20.0000 mg | Freq: Once | INTRAVENOUS | Status: AC
  Administered 2019-01-26: 20 mg via INTRAVENOUS
  Filled 2019-01-26: qty 2

## 2019-01-26 MED ORDER — ACETAMINOPHEN 325 MG PO TABS
ORAL_TABLET | ORAL | Status: AC
  Filled 2019-01-26: qty 2

## 2019-01-26 MED ORDER — ACETAMINOPHEN 325 MG PO TABS
650.0000 mg | ORAL_TABLET | Freq: Once | ORAL | Status: AC
  Administered 2019-01-26: 650 mg via ORAL

## 2019-01-26 MED ORDER — DIPHENHYDRAMINE HCL 25 MG PO CAPS
25.0000 mg | ORAL_CAPSULE | Freq: Once | ORAL | Status: AC
  Administered 2019-01-26: 25 mg via ORAL

## 2019-01-26 MED ORDER — SODIUM CHLORIDE 0.9 % IV SOLN
Freq: Once | INTRAVENOUS | Status: AC
  Administered 2019-01-26: 11:00:00 via INTRAVENOUS
  Filled 2019-01-26: qty 250

## 2019-01-26 MED ORDER — PREDNISONE 20 MG PO TABS: 40.0000 mg | ORAL_TABLET | Freq: Every day | ORAL | 0 refills | Status: AC

## 2019-01-26 NOTE — Patient Instructions (Signed)
[facility] Discharge Instructions for Patients Receiving Chemotherapy  Today you received the following chemotherapy agents:  Etoposide, Carboplatin  To help prevent nausea and vomiting after your treatment, we encourage you to take your nausea medication as prescribed.   If you develop nausea and vomiting that is not controlled by your nausea medication, call the clinic.   BELOW ARE SYMPTOMS THAT SHOULD BE REPORTED IMMEDIATELY:  *FEVER GREATER THAN 100.5 F  *CHILLS WITH OR WITHOUT FEVER  NAUSEA AND VOMITING THAT IS NOT CONTROLLED WITH YOUR NAUSEA MEDICATION  *UNUSUAL SHORTNESS OF BREATH  *UNUSUAL BRUISING OR BLEEDING  TENDERNESS IN MOUTH AND THROAT WITH OR WITHOUT PRESENCE OF ULCERS  *URINARY PROBLEMS  *BOWEL PROBLEMS  UNUSUAL RASH Items with * indicate a potential emergency and should be followed up as soon as possible.  Feel free to call the clinic should you have any questions or concerns. The clinic phone number is [phone].  Please show the [place] at check-in to the Emergency Department and triage nurse.

## 2019-01-26 NOTE — Telephone Encounter (Signed)
Per 3/25 los appts already scheduled.

## 2019-01-26 NOTE — Progress Notes (Signed)
Dr. Irene Limbo would like dose of Carboplatin increased using today's weight.  Continue dose reduction (20%; AUC = 4) --> 550 mg today. Kennith Center, Pharm.D., CPP 01/26/2019@11 :43 AM

## 2019-01-27 ENCOUNTER — Inpatient Hospital Stay: Payer: Medicaid Other

## 2019-01-27 ENCOUNTER — Other Ambulatory Visit: Payer: Self-pay

## 2019-01-27 VITALS — BP 141/57 | HR 63 | Temp 98.7°F | Resp 18

## 2019-01-27 DIAGNOSIS — Z7189 Other specified counseling: Secondary | ICD-10-CM

## 2019-01-27 DIAGNOSIS — C801 Malignant (primary) neoplasm, unspecified: Principal | ICD-10-CM

## 2019-01-27 DIAGNOSIS — Z5111 Encounter for antineoplastic chemotherapy: Secondary | ICD-10-CM | POA: Diagnosis not present

## 2019-01-27 DIAGNOSIS — C787 Secondary malignant neoplasm of liver and intrahepatic bile duct: Secondary | ICD-10-CM

## 2019-01-27 MED ORDER — SODIUM CHLORIDE 0.9% FLUSH
10.0000 mL | INTRAVENOUS | Status: DC | PRN
  Administered 2019-01-27: 10 mL
  Filled 2019-01-27: qty 10

## 2019-01-27 MED ORDER — HEPARIN SOD (PORK) LOCK FLUSH 100 UNIT/ML IV SOLN
500.0000 [IU] | Freq: Once | INTRAVENOUS | Status: AC | PRN
  Administered 2019-01-27: 500 [IU]
  Filled 2019-01-27: qty 5

## 2019-01-27 MED ORDER — SODIUM CHLORIDE 0.9 % IV SOLN
80.0000 mg/m2 | Freq: Once | INTRAVENOUS | Status: AC
  Administered 2019-01-27: 150 mg via INTRAVENOUS
  Filled 2019-01-27: qty 7.5

## 2019-01-27 MED ORDER — DEXAMETHASONE SODIUM PHOSPHATE 10 MG/ML IJ SOLN
INTRAMUSCULAR | Status: AC
  Filled 2019-01-27: qty 1

## 2019-01-27 MED ORDER — DEXAMETHASONE SODIUM PHOSPHATE 10 MG/ML IJ SOLN
10.0000 mg | Freq: Once | INTRAMUSCULAR | Status: AC
  Administered 2019-01-27: 10 mg via INTRAVENOUS

## 2019-01-27 MED ORDER — SODIUM CHLORIDE 0.9 % IV SOLN
Freq: Once | INTRAVENOUS | Status: AC
  Administered 2019-01-27: 09:00:00 via INTRAVENOUS
  Filled 2019-01-27: qty 250

## 2019-01-27 NOTE — Patient Instructions (Signed)
[facility] Discharge Instructions for Patients Receiving Chemotherapy  Today you received the following chemotherapy agents:  Etoposide.  To help prevent nausea and vomiting after your treatment, we encourage you to take your nausea medication as prescribed.   If you develop nausea and vomiting that is not controlled by your nausea medication, call the clinic.   BELOW ARE SYMPTOMS THAT SHOULD BE REPORTED IMMEDIATELY:  *FEVER GREATER THAN 100.5 F  *CHILLS WITH OR WITHOUT FEVER  NAUSEA AND VOMITING THAT IS NOT CONTROLLED WITH YOUR NAUSEA MEDICATION  *UNUSUAL SHORTNESS OF BREATH  *UNUSUAL BRUISING OR BLEEDING  TENDERNESS IN MOUTH AND THROAT WITH OR WITHOUT PRESENCE OF ULCERS  *URINARY PROBLEMS  *BOWEL PROBLEMS  UNUSUAL RASH Items with * indicate a potential emergency and should be followed up as soon as possible.  Feel free to call the clinic should you have any questions or concerns. The clinic phone number is [phone].  Please show the [place] [place] at check-in to the Emergency Department and triage nurse.

## 2019-01-28 ENCOUNTER — Other Ambulatory Visit: Payer: Self-pay

## 2019-01-28 ENCOUNTER — Inpatient Hospital Stay: Payer: Medicaid Other

## 2019-01-28 VITALS — BP 127/58 | HR 70 | Temp 98.2°F | Resp 17

## 2019-01-28 DIAGNOSIS — C801 Malignant (primary) neoplasm, unspecified: Principal | ICD-10-CM

## 2019-01-28 DIAGNOSIS — Z7189 Other specified counseling: Secondary | ICD-10-CM

## 2019-01-28 DIAGNOSIS — C787 Secondary malignant neoplasm of liver and intrahepatic bile duct: Secondary | ICD-10-CM

## 2019-01-28 DIAGNOSIS — Z5111 Encounter for antineoplastic chemotherapy: Secondary | ICD-10-CM | POA: Diagnosis not present

## 2019-01-28 MED ORDER — SODIUM CHLORIDE 0.9 % IV SOLN
80.0000 mg/m2 | Freq: Once | INTRAVENOUS | Status: AC
  Administered 2019-01-28: 150 mg via INTRAVENOUS
  Filled 2019-01-28: qty 7.5

## 2019-01-28 MED ORDER — DEXAMETHASONE SODIUM PHOSPHATE 10 MG/ML IJ SOLN
10.0000 mg | Freq: Once | INTRAMUSCULAR | Status: AC
  Administered 2019-01-28: 10 mg via INTRAVENOUS

## 2019-01-28 MED ORDER — SODIUM CHLORIDE 0.9% FLUSH
10.0000 mL | INTRAVENOUS | Status: DC | PRN
  Administered 2019-01-28: 10 mL
  Filled 2019-01-28: qty 10

## 2019-01-28 MED ORDER — SODIUM CHLORIDE 0.9 % IV SOLN
Freq: Once | INTRAVENOUS | Status: AC
  Administered 2019-01-28: 09:00:00 via INTRAVENOUS
  Filled 2019-01-28: qty 250

## 2019-01-28 MED ORDER — HEPARIN SOD (PORK) LOCK FLUSH 100 UNIT/ML IV SOLN
500.0000 [IU] | Freq: Once | INTRAVENOUS | Status: AC | PRN
  Administered 2019-01-28: 500 [IU]
  Filled 2019-01-28: qty 5

## 2019-01-28 MED ORDER — DEXAMETHASONE SODIUM PHOSPHATE 10 MG/ML IJ SOLN
INTRAMUSCULAR | Status: AC
  Filled 2019-01-28: qty 1

## 2019-01-28 NOTE — Patient Instructions (Signed)
Coronavirus (COVID-19) Are you at risk?  Are you at risk for the Coronavirus (COVID-19)?  To be considered HIGH RISK for Coronavirus (COVID-19), you have to meet the following criteria:  . Traveled to [place], [place], [place], [place] or [place]; or in the [place] to [place], [place], [place], or [place]; and have fever, cough, and shortness of breath within the last 2 weeks of travel OR . Been in close contact with a person diagnosed with COVID-19 within the last 2 weeks and have fever, cough, and shortness of breath . IF YOU DO NOT MEET THESE CRITERIA, YOU ARE CONSIDERED LOW RISK FOR COVID-19.  What to do if you are HIGH RISK for COVID-19?  . If you are having a medical emergency, call 911. . Seek medical care right away. Before you go to a doctor's office, urgent care or emergency department, call ahead and tell them about your recent travel, contact with someone diagnosed with COVID-19, and your symptoms. You should receive instructions from your physician's office regarding next steps of care.  . When you arrive at healthcare provider, tell the healthcare staff immediately you have returned from visiting [place], [place], [place], [place] or [place]; or traveled in the [place] to [place], [place], [place], or [place]; in the last two weeks or you have been in close contact with a person diagnosed with COVID-19 in the last 2 weeks.   . Tell the health care staff about your symptoms: fever, cough and shortness of breath. . After you have been seen by a medical provider, you will be either: o Tested for (COVID-19) and discharged home on quarantine except to seek medical care if symptoms worsen, and asked to  - Stay home and avoid contact with others until you get your results (4-5 days)  - Avoid travel on public transportation if possible (such as bus, train, or airplane) or o Sent to the Emergency Department by EMS for evaluation, COVID-19 testing, and possible  admission depending on your condition and test results.  What to do if you are LOW RISK for COVID-19?  Reduce your risk of any infection by using the same precautions used for avoiding the common cold or flu:  . Wash your hands often with soap and warm water for at least 20 seconds.  If soap and water are not readily available, use an alcohol-based hand sanitizer with at least 60% alcohol.  . If coughing or sneezing, cover your mouth and nose by coughing or sneezing into the elbow areas of your shirt or coat, into a tissue or into your sleeve (not your hands). . Avoid shaking hands with others and consider head nods or verbal greetings only. . Avoid touching your eyes, nose, or mouth with unwashed hands.  . Avoid close contact with people who are sick. . Avoid places or events with large numbers of people in one location, like concerts or sporting events. . Carefully consider travel plans you have or are making. . If you are planning any travel outside or inside the [place], visit the CDC's Travelers' Health webpage for the latest health notices. . If you have some symptoms but not all symptoms, continue to monitor at home and seek medical attention if your symptoms worsen. . If you are having a medical emergency, call 911.   ADDITIONAL HEALTHCARE OPTIONS FOR PATIENTS  [facility] Tele[facility] / e-Visit: [email or web address]         [facility]: [phone]  [place]   Urgent Care: [facility]: [place] Discharge Instructions for Patients Receiving Chemotherapy  Today you received the following chemotherapy agents: Etoposide (Vepesid, VP-16)  To help prevent nausea and vomiting after your treatment, we encourage you to take your nausea medication as directed.    If you develop nausea and vomiting that is not controlled by your nausea medication, call the  clinic.   BELOW ARE SYMPTOMS THAT SHOULD BE REPORTED IMMEDIATELY:  *FEVER GREATER THAN 100.5 F  *CHILLS WITH OR WITHOUT FEVER  NAUSEA AND VOMITING THAT IS NOT CONTROLLED WITH YOUR NAUSEA MEDICATION  *UNUSUAL SHORTNESS OF BREATH  *UNUSUAL BRUISING OR BLEEDING  TENDERNESS IN MOUTH AND THROAT WITH OR WITHOUT PRESENCE OF ULCERS  *URINARY PROBLEMS  *BOWEL PROBLEMS  UNUSUAL RASH Items with * indicate a potential emergency and should be followed up as soon as possible.  Feel free to call the clinic should you have any questions or concerns. The clinic phone number is [phone].  Please show the [facility] at check-in to the Emergency Department and triage nurse.

## 2019-01-29 ENCOUNTER — Other Ambulatory Visit: Payer: Self-pay

## 2019-01-29 ENCOUNTER — Inpatient Hospital Stay: Payer: Medicaid Other

## 2019-01-29 VITALS — BP 142/60 | HR 74 | Temp 98.1°F | Resp 16

## 2019-01-29 DIAGNOSIS — C801 Malignant (primary) neoplasm, unspecified: Principal | ICD-10-CM

## 2019-01-29 DIAGNOSIS — Z7189 Other specified counseling: Secondary | ICD-10-CM

## 2019-01-29 DIAGNOSIS — Z5111 Encounter for antineoplastic chemotherapy: Secondary | ICD-10-CM | POA: Diagnosis not present

## 2019-01-29 DIAGNOSIS — C787 Secondary malignant neoplasm of liver and intrahepatic bile duct: Secondary | ICD-10-CM

## 2019-01-29 MED ORDER — PEGFILGRASTIM INJECTION 6 MG/0.6ML ~~LOC~~
PREFILLED_SYRINGE | SUBCUTANEOUS | Status: AC
  Filled 2019-01-29: qty 0.6

## 2019-01-29 MED ORDER — PEGFILGRASTIM INJECTION 6 MG/0.6ML ~~LOC~~
6.0000 mg | PREFILLED_SYRINGE | Freq: Once | SUBCUTANEOUS | Status: AC
  Administered 2019-01-29: 6 mg via SUBCUTANEOUS

## 2019-01-29 NOTE — Patient Instructions (Signed)
Pegfilgrastim injection  What is this medicine?  PEGFILGRASTIM (PEG fil gra stim) is a long-acting granulocyte colony-stimulating factor that stimulates the growth of neutrophils, a type of white blood cell important in the body's fight against infection. It is used to reduce the incidence of fever and infection in patients with certain types of cancer who are receiving chemotherapy that affects the bone marrow, and to increase survival after being exposed to high doses of radiation.  This medicine may be used for other purposes; ask your health care provider or pharmacist if you have questions.  COMMON BRAND NAME(S): Fulphila, Neulasta, UDENYCA  What should I tell my health care provider before I take this medicine?  They need to know if you have any of these conditions:  -kidney disease  -latex allergy  -ongoing radiation therapy  -sickle cell disease  -skin reactions to acrylic adhesives (On-Body Injector only)  -an unusual or allergic reaction to pegfilgrastim, filgrastim, other medicines, foods, dyes, or preservatives  -pregnant or trying to get pregnant  -breast-feeding  How should I use this medicine?  This medicine is for injection under the skin. If you get this medicine at home, you will be taught how to prepare and give the pre-filled syringe or how to use the On-body Injector. Refer to the patient Instructions for Use for detailed instructions. Use exactly as directed. Tell your healthcare provider immediately if you suspect that the On-body Injector may not have performed as intended or if you suspect the use of the On-body Injector resulted in a missed or partial dose.  It is important that you put your used needles and syringes in a special sharps container. Do not put them in a trash can. If you do not have a sharps container, call your pharmacist or healthcare provider to get one.  Talk to your pediatrician regarding the use of this medicine in children. While this drug may be prescribed for  selected conditions, precautions do apply.  Overdosage: If you think you have taken too much of this medicine contact a poison control center or emergency room at once.  NOTE: This medicine is only for you. Do not share this medicine with others.  What if I miss a dose?  It is important not to miss your dose. Call your doctor or health care professional if you miss your dose. If you miss a dose due to an On-body Injector failure or leakage, a new dose should be administered as soon as possible using a single prefilled syringe for manual use.  What may interact with this medicine?  Interactions have not been studied.  Give your health care provider a list of all the medicines, herbs, non-prescription drugs, or dietary supplements you use. Also tell them if you smoke, drink alcohol, or use illegal drugs. Some items may interact with your medicine.  This list may not describe all possible interactions. Give your health care provider a list of all the medicines, herbs, non-prescription drugs, or dietary supplements you use. Also tell them if you smoke, drink alcohol, or use illegal drugs. Some items may interact with your medicine.  What should I watch for while using this medicine?  You may need blood work done while you are taking this medicine.  If you are going to need a MRI, CT scan, or other procedure, tell your doctor that you are using this medicine (On-Body Injector only).  What side effects may I notice from receiving this medicine?  Side effects that you should report to   your doctor or health care professional as soon as possible:  -allergic reactions like skin rash, itching or hives, swelling of the face, lips, or tongue  -back pain  -dizziness  -fever  -pain, redness, or irritation at site where injected  -pinpoint red spots on the skin  -red or dark-brown urine  -shortness of breath or breathing problems  -stomach or side pain, or pain at the shoulder  -swelling  -tiredness  -trouble passing urine or  change in the amount of urine  Side effects that usually do not require medical attention (report to your doctor or health care professional if they continue or are bothersome):  -bone pain  -muscle pain  This list may not describe all possible side effects. Call your doctor for medical advice about side effects. You may report side effects to FDA at 1-800-FDA-1088.  Where should I keep my medicine?  Keep out of the reach of children.  If you are using this medicine at home, you will be instructed on how to store it. Throw away any unused medicine after the expiration date on the label.  NOTE: This sheet is a summary. It may not cover all possible information. If you have questions about this medicine, talk to your doctor, pharmacist, or health care provider.   2019 [employer] (2018-01-25 16:57:08)

## 2019-01-31 ENCOUNTER — Encounter: Payer: Self-pay | Admitting: Hematology

## 2019-02-01 ENCOUNTER — Encounter: Payer: Self-pay | Admitting: Hematology

## 2019-02-03 ENCOUNTER — Telehealth: Payer: Self-pay | Admitting: *Deleted

## 2019-02-03 NOTE — Telephone Encounter (Signed)
Contacted patient in response to MyChart questions regarding lab results and work note for wife. Per Dr. Irene Limbo, CBC w/diff and reticulocytes are stable and show patient is tolerating treatment. Patient/wife verbalized understanding.  Asked wife what type of specific note she needed for work. An FMLA was completed on her behalf 10/13/2018. Encouraged her to speak with her employer to discover if any further documenation is necessary to allow her time off from work to care for husband. She verbalized understanding.

## 2019-02-04 ENCOUNTER — Encounter: Payer: Self-pay | Admitting: Hematology

## 2019-02-07 ENCOUNTER — Other Ambulatory Visit: Payer: Self-pay | Admitting: Hematology

## 2019-02-07 MED ORDER — OXYCODONE HCL 5 MG PO TABS: 5.0000 mg | ORAL_TABLET | ORAL | 0 refills | Status: DC | PRN

## 2019-02-09 ENCOUNTER — Encounter: Payer: Self-pay | Admitting: Hematology

## 2019-02-09 ENCOUNTER — Other Ambulatory Visit: Payer: Self-pay

## 2019-02-09 ENCOUNTER — Inpatient Hospital Stay: Payer: Medicaid Other | Attending: Hematology

## 2019-02-09 DIAGNOSIS — C7B8 Other secondary neuroendocrine tumors: Secondary | ICD-10-CM | POA: Diagnosis not present

## 2019-02-09 DIAGNOSIS — C7A1 Malignant poorly differentiated neuroendocrine tumors: Secondary | ICD-10-CM | POA: Diagnosis not present

## 2019-02-09 DIAGNOSIS — Z79899 Other long term (current) drug therapy: Secondary | ICD-10-CM | POA: Insufficient documentation

## 2019-02-09 DIAGNOSIS — Z5111 Encounter for antineoplastic chemotherapy: Secondary | ICD-10-CM | POA: Diagnosis not present

## 2019-02-09 DIAGNOSIS — N2889 Other specified disorders of kidney and ureter: Secondary | ICD-10-CM | POA: Insufficient documentation

## 2019-02-09 DIAGNOSIS — R945 Abnormal results of liver function studies: Secondary | ICD-10-CM | POA: Insufficient documentation

## 2019-02-09 DIAGNOSIS — C7951 Secondary malignant neoplasm of bone: Secondary | ICD-10-CM

## 2019-02-09 DIAGNOSIS — C801 Malignant (primary) neoplasm, unspecified: Secondary | ICD-10-CM

## 2019-02-09 DIAGNOSIS — C787 Secondary malignant neoplasm of liver and intrahepatic bile duct: Secondary | ICD-10-CM | POA: Diagnosis not present

## 2019-02-09 DIAGNOSIS — Z7189 Other specified counseling: Secondary | ICD-10-CM

## 2019-02-09 DIAGNOSIS — Z95828 Presence of other vascular implants and grafts: Secondary | ICD-10-CM

## 2019-02-09 MED ORDER — DENOSUMAB 120 MG/1.7ML ~~LOC~~ SOLN
120.0000 mg | Freq: Once | SUBCUTANEOUS | Status: AC
  Administered 2019-02-09: 120 mg via SUBCUTANEOUS

## 2019-02-09 NOTE — Patient Instructions (Signed)

## 2019-02-15 NOTE — Progress Notes (Signed)
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  HISTORY OF PRESENTING ILLNESS:   [name] is a wonderful 58 y.o. male who has been referred to [place] by Dr. Francine Graven for evaluation and management of Liver masses. He is accompanied today by his wife and aunt. The pt reports that he is doing well overall.   The pt presented to the ED on 10/01/18 regarding persistent RUQ abdominal pain which began a month prior, and noted associations with nausea and 40 pound weight loss. He was evaluated with a CT A/P, as noted below, which revealed liver masses.   The pt reports that he first began feeling RUQ abdominal pain about 3-4 weeks ago, but began losing weight about 2 months ago. He notes that he began feeling more fatigue about 2 months ago as well.  The pt notes that the color of his bowels became dark black and denies taking Iron pills or bismuth medications. He notes that he began feeling constipated as well, feeling backed up. He notes that his stools began looking thinner in caliber. The pt notes that his appetite began decreasing over the last 3-4 weeks ago, and some of this he attributes to intermittent nausea. He denies problems swallowing food. He endorses RUQ pain, and denies vomiting and new cough, new bone pains, testicular pain or swelling, problems passing urine, blood in the urine. The pt has never had a colonoscopy and does not have a PCP, and cites his lack of health insurance as the reason.  The pt was released from the ED with Oxycodone and has been using this to treat his RUQ pain successfully.   The pt notes gout and a heart murmur as previous medical history. He notes that he hasn't consumed alcohol in the past 2 years whatsoever, but prior to this alcohol was  a problem for him.   Of note prior to the patient's visit today, pt has had a CT A/P completed on 10/01/18 with results revealing Interval development of numerous ill-defined hypodense masses throughout the entire liver compatible with metastatic disease. There is a 2.5 cm enhancing mass over the upper pole cortex of the right kidney suspicious for renal cell carcinoma. 2.  No acute findings in the abdomen/pelvis. 3. 1 cm hypodensity over the pancreatic tail not well seen on previous noncontrast exams. MR may be helpful for further evaluation on an elective basis.   Most recent lab results (10/06/18) of CBC w/diff and CMP is as follows: all values are WNL except for Monocytes abs at 1.2k, Sodium at 134, Albumin at 3.0, AST at 77, ALT at 63, Alk Phos at 392, Total Bilirubin at 2.3   On review of systems, pt reports new constipation, smaller caliber stools, dark stools, unexpected weight loss, RUQ pain, new fatigue, and denies vomiting, cough, new SOB, changes in breathing, new bone pains, leg swelling, blood in the urine, changes in urination, fevers, pain along the spine, and any other symptoms.   On PMHx the pt reports Gout, heart murmur, previous alcohol abuse. He denies any surgeries.  On Social Hx the pt reports that he hasn't consumed alcohol in the past 2 years whatsoever. He notes that alcohol abuse was previously a concern. He denies ever smoking cigarettes or doing drugs. He denies any radiation or chemical exposure.  On Family  Hx the pt reports mother with pancreatic cancer in 24s, maternal aunts with high blood pressure and heart failure, maternal aunt with breast cancer.  Interval History:  [name] returns today for management, evaluation, prior to C6 treatment of his newly diagnosed High grade neuroendocrine carcinoma. The patient's last visit with [place] was on 01/26/19. The pt is accompanied today by his sister via cell phone. The pt reports that he is doing well overall.   The pt  reports that he has not had abdominal pains, joint aches or pains. He notes that he has continued eating well and has continued to gain weight. He denies any concerns for infections. The pt denies any mouth sores, constipation or diarrhea. He notes that he had one day of nausea in the interim, which was controlled with his available nausea medications. He has not had leg swelling and has not needed to take his lasix in the interim. He has avoided crowds and individuals with infections. The pt notes that he intends to walk more and be more active.  Lab results today (02/16/19) of CBC w/diff is as follows: all values are WNL except for RBC at 3.92, HGB at 11.1, HCT at 35.2, PLT at 122k. 02/16/19 LDH and CMP are pending  On review of systems, pt reports good energy levels, eating well, weight gain, and denies concerns for infections, joint aches or pains, abdominal pains, fevers, chills, mouth sores, diarrhea, constipation, back pains, abdominal pains, leg swelling, and any other symptoms.   MEDICAL HISTORY:  Past Medical History:  Diagnosis Date   Arthritis    "knees; left hand"   Cancer ([facility])    Gout    Heart murmur    Longstanding. Mild aortic valve thickening with trivial AR by echo 02/2012   Hypertension    Near syncope    05/2011 with tachypalpitations & with echo showing mild LVH, normal EF 55-60%, trivial AR    SURGICAL HISTORY: Past Surgical History:  Procedure Laterality Date   IR IMAGING GUIDED PORT INSERTION  10/26/2018   NO PAST SURGERIES      SOCIAL HISTORY: Social History   Socioeconomic History   Marital status: Married    Spouse name: Not on file   Number of children: 1   Years of education: Not on file   Highest education level: Not on file  Occupational History   Occupation: Unemployed  Scientist, product/process development strain: Not on file   Food insecurity:    Worry: Not on file    Inability: Not on file   Transportation needs:    Medical:  Not on file    Non-medical: Not on file  Tobacco Use   Smoking status: Never Smoker   Smokeless tobacco: Never Used  Substance and Sexual Activity   Alcohol use: Not Currently    Comment: states he has cut back/ every now and again   Drug use: No   Sexual activity: Yes    Partners: Female  Lifestyle   Physical activity:    Days per week: Not on file    Minutes per session: Not on file   Stress: Not on file  Relationships   Social connections:    Talks on phone: Not on file    Gets together: Not on file    Attends religious service: Not on file    Active member of club or organization: Not on file    Attends meetings of clubs or organizations: Not on file  Relationship status: Not on file   Intimate partner violence:    Fear of current or ex partner: Not on file    Emotionally abused: Not on file    Physically abused: Not on file    Forced sexual activity: Not on file  Other Topics Concern   Not on file  Social History Narrative   Married. 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 Is from [place].     FAMILY HISTORY: Family History  Problem Relation Age of Onset   Cancer Mother        Possibly pancreatic. Died at age 49   Prostate cancer Father    Breast cancer Cousin    Diabetes Paternal Uncle    Heart disease Paternal Uncle     ALLERGIES:  has No Known Allergies.  MEDICATIONS:  Current Outpatient Medications  Medication Sig Dispense Refill   allopurinol (ZYLOPRIM) 100 MG tablet Take 2 tablets (200 mg total) by mouth daily. 60 tablet 1   clindamycin (CLEOCIN) 300 MG capsule Take 1 capsule (300 mg total) by mouth 4 (four) times daily. X 7 days 28 capsule 0   colchicine 0.6 MG tablet Take 1 tablet (0.6 mg total) by mouth See admin instructions. Take 1.57m (2 tablets) followed by .667m(1 tablet) 1 hour later. (Patient not taking: Reported on 12/11/2018) 3 tablet 0   dexamethasone (DECADRON) 4 MG tablet  10m62mo daily with breakfast for 2 weeks then every other day for 2 weeks 30 tablet 0   furosemide (LASIX) 20 MG tablet Take 1 tablet (20 mg total) by mouth daily. 30 tablet 1   lidocaine-prilocaine (EMLA) cream Apply to affected area once (Patient not taking: Reported on 12/11/2018) 30 g 3   LORazepam (ATIVAN) 0.5 MG tablet Take 1 tablet (0.5 mg total) by mouth every 6 (six) hours as needed (Nausea or vomiting). 30 tablet 0   ondansetron (ZOFRAN ODT) 4 MG disintegrating tablet Take 1 tablet (4 mg total) by mouth every 8 (eight) hours as needed for nausea or vomiting. 6 tablet 0   oxyCODONE (OXY IR/ROXICODONE) 5 MG immediate release tablet Take 1-2 tablets (5-10 mg total) by mouth every 4 (four) hours as needed for severe pain. 90 tablet 0   potassium chloride SA (K-DUR,KLOR-CON) 20 MEQ tablet Take 1 tablet (20 mEq total) by mouth daily. With lasix 30 tablet 1   prochlorperazine (COMPAZINE) 10 MG tablet Take 1 tablet (10 mg total) by mouth every 6 (six) hours as needed (Nausea or vomiting). 30 tablet 1   No current facility-administered medications for this visit.    Facility-Administered Medications Ordered in Other Visits  Medication Dose Route Frequency Provider Last Rate Last Dose   denosumab (XGEVA) injection 120 mg  120 mg Subcutaneous Once KalIrene LimboauCloria SpringD        REVIEW OF SYSTEMS:    A 10+ POINT REVIEW OF SYSTEMS WAS OBTAINED including neurology, dermatology, psychiatry, cardiac, respiratory, lymph, extremities, GI, GU, Musculoskeletal, constitutional, breasts, reproductive, HEENT.  All pertinent positives are noted in the HPI.  All others are negative.   PHYSICAL EXAMINATION: ECOG PERFORMANCE STATUS: 1 - Symptomatic but completely ambulatory  Vitals:   02/16/19 1054  BP: (!) 147/70  Pulse: 64  Resp: 17  Temp: 98.1 F (36.7 C)  SpO2: 100%   Filed Weights   02/16/19 1054  Weight: 178 lb 3.2 oz (80.8 kg)   .Body mass index is 27.91 kg/m.   GENERAL:alert, in  no  acute distress and comfortable SKIN: no acute rashes, no significant lesions EYES: conjunctiva are pink and non-injected, sclera anicteric OROPHARYNX: MMM, no exudates, no oropharyngeal erythema or ulceration NECK: supple, no JVD LYMPH:  no palpable lymphadenopathy in the cervical, axillary or inguinal regions LUNGS: clear to auscultation b/l with normal respiratory effort HEART: regular rate & rhythm ABDOMEN:  normoactive bowel sounds , non tender, not distended. Improving hepatomegaly, now palpable 3cm under right mid-costal margin line. Extremity: no pedal edema PSYCH: alert & oriented x 3 with fluent speech NEURO: no focal motor/sensory deficits   LABORATORY DATA:  I have reviewed the data as listed  . CBC Latest Ref Rng & Units 02/16/2019 01/26/2019 01/05/2019  WBC 4.0 - 10.5 K/uL 10.0 17.1(H) 20.9(H)  Hemoglobin 13.0 - 17.0 g/dL 11.1(L) 10.3(L) 9.8(L)  Hematocrit 39.0 - 52.0 % 35.2(L) 33.3(L) 32.1(L)  Platelets 150 - 400 K/uL 122(L) 166 123(L)    . CMP Latest Ref Rng & Units 01/26/2019 01/05/2019 12/24/2018  Glucose 70 - 99 mg/dL 147(H) 125(H) 124(H)  BUN 6 - 20 mg/dL '10 12 10  ' Creatinine 0.61 - 1.24 mg/dL 0.77 0.74 0.67  Sodium 135 - 145 mmol/L 142 138 141  Potassium 3.5 - 5.1 mmol/L 3.8 3.9 3.9  Chloride 98 - 111 mmol/L 105 102 106  CO2 22 - 32 mmol/L '28 29 26  ' Calcium 8.9 - 10.3 mg/dL 8.7(L) 8.2(L) 8.1(L)  Total Protein 6.5 - 8.1 g/dL 6.8 6.6 6.6  Total Bilirubin 0.3 - 1.2 mg/dL 0.4 0.5 0.4  Alkaline Phos 38 - 126 U/L 111 111 207(H)  AST 15 - 41 U/L 14(L) 15 14(L)  ALT 0 - 44 U/L '14 15 14    ' 10/08/18 Liver Biopsy:    RADIOGRAPHIC STUDIES: I have personally reviewed the radiological images as listed and agreed with the findings in the report. No results found.  ASSESSMENT & PLAN:  58 y.o. male with  1. Metastatic High Grade Neuroendocrine Carcinoma with Multiple liver masses  10/01/18 CT A/P revealed Interval development of numerous ill-defined hypodense masses  throughout the entire liver compatible with metastatic disease. There is a 2.5 cm enhancing mass over the upper pole cortex of the right kidney suspicious for renal cell carcinoma. 2.  No acute findings in the abdomen/pelvis. 3. 1 cm hypodensity over the pancreatic tail not well seen on previous noncontrast exams. MR may be helpful for further evaluation on an elective basis.    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 2. Upper pole right renal lesion is not definitely hypermetabolic and has been present since 2012. I think is unlikely the cause of the metastatic disease. Recommend liver biopsy for tissue diagnosis. If needed, an MRI of the abdomen without and with contrast may be helpful for further evaluation of the renal lesion. 3. Two sub 3 mm right upper lobe pulmonary nodules are indeterminate.   10/26/18 MRI Brain revealed No acute abnormality and negative for metastatic disease.  2. Abnormal LFTs - likely from liver metastases  Labs upon initial presentation from 10/06/18, some borderline monocytosis at 1.2k, otherwise normal blood counts, AST at 77, ALT at 63, Alk Phos at 392, Total Bilirubin at 2.3   3. Rt renal mass - concerning for RCC  PLAN:  -Discussed pt labwork today, 02/16/19; WBC normal at 10k, HGB improved to 11.1, PLT at 122k. -The pt has no prohibitive toxicities from continuing C6 Carboplatin and 20% dose reduced Etoposide at this time. -Will repeat CT after completing C6 for treatment efficacy evaluation, further treatment planning and RT considerations, with attention to previously seen questionable right upper pole renal involvement. Will reassess on upcoming imaging. -Previously  ordered PET/CT for completion at end of C4 to evaluate treatment efficacy, which was denied. Will now evaluate treatment efficacy after concluding C6 treatment. -Regarding the patient's C1 thrombocytopenia, did decrease dose of Etoposide by 20% for subsequent cycles -Continue with compression socks and leg elevation  -Lasix as needed, with Potassium replacement  -Continue high protein and high calorie meal supplements such as Boost or Ensure -Plan for Carboplatin and Etoposide every 3 weeks, with G-CSF support for up to 6 cycles -Strongly recommended that the pt not lift, push, or pull anything to prevent the risk of pathologic fractures as the PET/CT does indicate bone involvement -Continue Xgeva every 4 weeks -Oxycodone for pain mx, up to every 6 hours -Recommended that the pt continue to eat well, drink at least 48-64 oz of water each day, and walk 20-30 minutes each day.  -Will see the pt back in 5 weeks   Labs and CT chest/Abd/pelvis in 4 weeks RTC with Dr Irene Limbo in 5 weeks   All of the patients questions were answered with apparent satisfaction. The patient knows to call the clinic with any problems, questions or concerns.  The total time spent in the appt was 25 minutes and more than 50% was on counseling and direct patient cares.    Sullivan Lone MD MS AAHIVMS [facility] Hematology/Oncology Physician [facility]  (Office):       [phone] (Work cell):  [phone] (Fax):           [phone]  02/16/2019 11:26 AM  I, Baldwin Jamaica, am acting as a scribe for Dr. Sullivan Lone.   .I have reviewed the above documentation for accuracy and completeness, and I agree with the above. Brunetta Genera MD

## 2019-02-16 ENCOUNTER — Inpatient Hospital Stay: Payer: Medicaid Other

## 2019-02-16 ENCOUNTER — Inpatient Hospital Stay (HOSPITAL_BASED_OUTPATIENT_CLINIC_OR_DEPARTMENT_OTHER): Payer: Medicaid Other | Admitting: Hematology

## 2019-02-16 ENCOUNTER — Other Ambulatory Visit: Payer: Self-pay

## 2019-02-16 ENCOUNTER — Telehealth: Payer: Self-pay | Admitting: Hematology

## 2019-02-16 VITALS — BP 147/70 | HR 64 | Temp 98.1°F | Resp 17 | Ht 67.0 in | Wt 178.2 lb

## 2019-02-16 DIAGNOSIS — C787 Secondary malignant neoplasm of liver and intrahepatic bile duct: Secondary | ICD-10-CM

## 2019-02-16 DIAGNOSIS — C801 Malignant (primary) neoplasm, unspecified: Secondary | ICD-10-CM

## 2019-02-16 DIAGNOSIS — Z5111 Encounter for antineoplastic chemotherapy: Secondary | ICD-10-CM | POA: Diagnosis not present

## 2019-02-16 DIAGNOSIS — Z79899 Other long term (current) drug therapy: Secondary | ICD-10-CM

## 2019-02-16 DIAGNOSIS — Z7189 Other specified counseling: Secondary | ICD-10-CM

## 2019-02-16 DIAGNOSIS — N2889 Other specified disorders of kidney and ureter: Secondary | ICD-10-CM | POA: Diagnosis not present

## 2019-02-16 DIAGNOSIS — C7B8 Other secondary neuroendocrine tumors: Secondary | ICD-10-CM | POA: Diagnosis not present

## 2019-02-16 DIAGNOSIS — Z95828 Presence of other vascular implants and grafts: Secondary | ICD-10-CM

## 2019-02-16 DIAGNOSIS — R945 Abnormal results of liver function studies: Secondary | ICD-10-CM

## 2019-02-16 DIAGNOSIS — C7A1 Malignant poorly differentiated neuroendocrine tumors: Secondary | ICD-10-CM | POA: Diagnosis not present

## 2019-02-16 DIAGNOSIS — C7951 Secondary malignant neoplasm of bone: Secondary | ICD-10-CM

## 2019-02-16 LAB — CBC WITH DIFFERENTIAL/PLATELET
Abs Immature Granulocytes: 1.28 10*3/uL — ABNORMAL HIGH (ref 0.00–0.07)
Basophils Absolute: 0 10*3/uL (ref 0.0–0.1)
Basophils Relative: 0 %
Eosinophils Absolute: 0.1 10*3/uL (ref 0.0–0.5)
Eosinophils Relative: 1 %
HCT: 35.2 % — ABNORMAL LOW (ref 39.0–52.0)
Hemoglobin: 11.1 g/dL — ABNORMAL LOW (ref 13.0–17.0)
Immature Granulocytes: 13 %
Lymphocytes Relative: 14 %
Lymphs Abs: 1.4 10*3/uL (ref 0.7–4.0)
MCH: 28.3 pg (ref 26.0–34.0)
MCHC: 31.5 g/dL (ref 30.0–36.0)
MCV: 89.8 fL (ref 80.0–100.0)
Monocytes Absolute: 1 10*3/uL (ref 0.1–1.0)
Monocytes Relative: 10 %
Neutro Abs: 6.2 10*3/uL (ref 1.7–7.7)
Neutrophils Relative %: 62 %
Platelets: 122 10*3/uL — ABNORMAL LOW (ref 150–400)
RBC: 3.92 MIL/uL — ABNORMAL LOW (ref 4.22–5.81)
RDW: 15.2 % (ref 11.5–15.5)
WBC: 10 10*3/uL (ref 4.0–10.5)
nRBC: 0 % (ref 0.0–0.2)

## 2019-02-16 LAB — CMP (CANCER CENTER ONLY)
ALT: 15 U/L (ref 0–44)
AST: 16 U/L (ref 15–41)
Albumin: 3.6 g/dL (ref 3.5–5.0)
Alkaline Phosphatase: 105 U/L (ref 38–126)
Anion gap: 9 (ref 5–15)
BUN: 9 mg/dL (ref 6–20)
CO2: 26 mmol/L (ref 22–32)
Calcium: 8.9 mg/dL (ref 8.9–10.3)
Chloride: 104 mmol/L (ref 98–111)
Creatinine: 0.85 mg/dL (ref 0.61–1.24)
GFR, Est AFR Am: >60 mL/min (ref >60)
GFR, Estimated: >60 mL/min (ref >60)
Glucose, Bld: 226 mg/dL — ABNORMAL HIGH (ref 70–99)
Potassium: 4 mmol/L (ref 3.5–5.1)
Sodium: 139 mmol/L (ref 135–145)
Total Bilirubin: 0.3 mg/dL (ref 0.3–1.2)
Total Protein: 7 g/dL (ref 6.5–8.1)

## 2019-02-16 LAB — LACTATE DEHYDROGENASE: LDH: 209 U/L — ABNORMAL HIGH (ref 98–192)

## 2019-02-16 MED ORDER — SODIUM CHLORIDE 0.9% FLUSH
10.0000 mL | INTRAVENOUS | Status: DC | PRN
  Administered 2019-02-16: 10 mL
  Filled 2019-02-16: qty 10

## 2019-02-16 MED ORDER — SODIUM CHLORIDE 0.9 % IV SOLN
80.0000 mg/m2 | Freq: Once | INTRAVENOUS | Status: AC
  Administered 2019-02-16: 150 mg via INTRAVENOUS
  Filled 2019-02-16: qty 7.5

## 2019-02-16 MED ORDER — HEPARIN SOD (PORK) LOCK FLUSH 100 UNIT/ML IV SOLN
500.0000 [IU] | Freq: Once | INTRAVENOUS | Status: AC | PRN
  Administered 2019-02-16: 500 [IU]
  Filled 2019-02-16: qty 5

## 2019-02-16 MED ORDER — DIPHENHYDRAMINE HCL 25 MG PO CAPS
ORAL_CAPSULE | ORAL | Status: AC
  Filled 2019-02-16: qty 1

## 2019-02-16 MED ORDER — PALONOSETRON HCL INJECTION 0.25 MG/5ML
INTRAVENOUS | Status: AC
  Filled 2019-02-16: qty 5

## 2019-02-16 MED ORDER — SODIUM CHLORIDE 0.9 % IV SOLN
Freq: Once | INTRAVENOUS | Status: AC
  Administered 2019-02-16: 12:00:00 via INTRAVENOUS
  Filled 2019-02-16: qty 5

## 2019-02-16 MED ORDER — SODIUM CHLORIDE 0.9 % IV SOLN
Freq: Once | INTRAVENOUS | Status: AC
  Administered 2019-02-16: 12:00:00 via INTRAVENOUS
  Filled 2019-02-16: qty 250

## 2019-02-16 MED ORDER — ACETAMINOPHEN 325 MG PO TABS
ORAL_TABLET | ORAL | Status: AC
  Filled 2019-02-16: qty 2

## 2019-02-16 MED ORDER — SODIUM CHLORIDE 0.9 % IV SOLN
500.0000 mg | Freq: Once | INTRAVENOUS | Status: AC
  Administered 2019-02-16: 500 mg via INTRAVENOUS
  Filled 2019-02-16: qty 50

## 2019-02-16 MED ORDER — PALONOSETRON HCL INJECTION 0.25 MG/5ML
0.2500 mg | Freq: Once | INTRAVENOUS | Status: AC
  Administered 2019-02-16: 0.25 mg via INTRAVENOUS

## 2019-02-16 MED ORDER — ACETAMINOPHEN 325 MG PO TABS
650.0000 mg | ORAL_TABLET | Freq: Once | ORAL | Status: AC
  Administered 2019-02-16: 650 mg via ORAL

## 2019-02-16 MED ORDER — SODIUM CHLORIDE 0.9 % IV SOLN
20.0000 mg | Freq: Once | INTRAVENOUS | Status: AC
  Administered 2019-02-16: 20 mg via INTRAVENOUS
  Filled 2019-02-16: qty 2

## 2019-02-16 MED ORDER — FAMOTIDINE IN NACL 20-0.9 MG/50ML-% IV SOLN: 20.0000 mg | Freq: Once | INTRAVENOUS | Status: DC

## 2019-02-16 MED ORDER — DIPHENHYDRAMINE HCL 25 MG PO CAPS
25.0000 mg | ORAL_CAPSULE | Freq: Once | ORAL | Status: AC
  Administered 2019-02-16: 25 mg via ORAL

## 2019-02-16 NOTE — Telephone Encounter (Signed)
Scheduled appt per 4/15 los.

## 2019-02-16 NOTE — Progress Notes (Signed)
Pt prefers Neulasta per Georgette Dover, RN.   Kennith Center, Pharm.D., CPP 02/16/2019@12 :39 PM

## 2019-02-16 NOTE — Patient Instructions (Signed)
[facility] Discharge Instructions for Patients Receiving Chemotherapy  Today you received the following chemotherapy agents:  Etoposide, Carboplatin  To help prevent nausea and vomiting after your treatment, we encourage you to take your nausea medication as prescribed.   If you develop nausea and vomiting that is not controlled by your nausea medication, call the clinic.   BELOW ARE SYMPTOMS THAT SHOULD BE REPORTED IMMEDIATELY:  *FEVER GREATER THAN 100.5 F  *CHILLS WITH OR WITHOUT FEVER  NAUSEA AND VOMITING THAT IS NOT CONTROLLED WITH YOUR NAUSEA MEDICATION  *UNUSUAL SHORTNESS OF BREATH  *UNUSUAL BRUISING OR BLEEDING  TENDERNESS IN MOUTH AND THROAT WITH OR WITHOUT PRESENCE OF ULCERS  *URINARY PROBLEMS  *BOWEL PROBLEMS  UNUSUAL RASH Items with * indicate a potential emergency and should be followed up as soon as possible.  Feel free to call the clinic should you have any questions or concerns. The clinic phone number is [phone].  Please show the [facility] at check-in to the Emergency Department and triage nurse.  Coronavirus (COVID-19) Are you at risk?  Are you at risk for the Coronavirus (COVID-19)?  To be considered HIGH RISK for Coronavirus (COVID-19), you have to meet the following criteria:  . Traveled to [place], [place], [place], [place] or [place]; or in the [place] to [place], [place], [place], or [place]; and have fever, cough, and shortness of breath within the last 2 weeks of travel OR . Been in close contact with a person diagnosed with COVID-19 within the last 2 weeks and have fever, cough, and shortness of breath . IF YOU DO NOT MEET THESE CRITERIA, YOU ARE CONSIDERED LOW RISK FOR COVID-19.  What to do if you are HIGH RISK for COVID-19?  Marland Kitchen If you are having a medical emergency, call 911. . Seek medical care right away. Before you go to a doctor's office, urgent care or emergency department, call ahead and tell them  about your recent travel, contact with someone diagnosed with COVID-19, and your symptoms. You should receive instructions from your physician's office regarding next steps of care.  . When you arrive at healthcare provider, tell the healthcare staff immediately you have returned from visiting [place], [place], [place], [place] or [place]; or traveled in the [place] to [place], [place], [place], or [place]; in the last two weeks or you have been in close contact with a person diagnosed with COVID-19 in the last 2 weeks.   . Tell the health care staff about your symptoms: fever, cough and shortness of breath. . After you have been seen by a medical provider, you will be either: o Tested for (COVID-19) and discharged home on quarantine except to seek medical care if symptoms worsen, and asked to  - Stay home and avoid contact with others until you get your results (4-5 days)  - Avoid travel on public transportation if possible (such as bus, train, or airplane) or o Sent to the Emergency Department by EMS for evaluation, COVID-19 testing, and possible admission depending on your condition and test results.  What to do if you are LOW RISK for COVID-19?  Reduce your risk of any infection by using the same precautions used for avoiding the common cold or flu:  Marland Kitchen Wash your hands often with soap and warm water for at least 20 seconds.  If soap and water are not readily available, use an alcohol-based hand sanitizer with at least 60% alcohol.  . If coughing  or sneezing, cover your mouth and nose by coughing or sneezing into the elbow areas of your shirt or coat, into a tissue or into your sleeve (not your hands). . Avoid shaking hands with others and consider head nods or verbal greetings only. . Avoid touching your eyes, nose, or mouth with unwashed hands.  . Avoid close contact with people who are sick. . Avoid places or events with large numbers of people in one location, like concerts or  sporting events. . Carefully consider travel plans you have or are making. . If you are planning any travel outside or inside the [place], visit the CDC's Travelers' Health webpage for the latest health notices. . If you have some symptoms but not all symptoms, continue to monitor at home and seek medical attention if your symptoms worsen. . If you are having a medical emergency, call 911.   [facility] / e-Visit: [email or web address]         [facility]: [facility]: [phone]                   [facility]: [phone]

## 2019-02-17 ENCOUNTER — Other Ambulatory Visit: Payer: Self-pay

## 2019-02-17 ENCOUNTER — Inpatient Hospital Stay: Payer: Medicaid Other

## 2019-02-17 VITALS — BP 169/74 | HR 67 | Temp 99.8°F | Resp 16

## 2019-02-17 DIAGNOSIS — Z5111 Encounter for antineoplastic chemotherapy: Secondary | ICD-10-CM | POA: Diagnosis not present

## 2019-02-17 DIAGNOSIS — Z7189 Other specified counseling: Secondary | ICD-10-CM

## 2019-02-17 DIAGNOSIS — C801 Malignant (primary) neoplasm, unspecified: Principal | ICD-10-CM

## 2019-02-17 DIAGNOSIS — C787 Secondary malignant neoplasm of liver and intrahepatic bile duct: Secondary | ICD-10-CM

## 2019-02-17 MED ORDER — SODIUM CHLORIDE 0.9% FLUSH
10.0000 mL | INTRAVENOUS | Status: DC | PRN
  Filled 2019-02-17: qty 10

## 2019-02-17 MED ORDER — DEXAMETHASONE SODIUM PHOSPHATE 10 MG/ML IJ SOLN
10.0000 mg | Freq: Once | INTRAMUSCULAR | Status: AC
  Administered 2019-02-17: 10 mg via INTRAVENOUS

## 2019-02-17 MED ORDER — SODIUM CHLORIDE 0.9 % IV SOLN
Freq: Once | INTRAVENOUS | Status: AC
  Administered 2019-02-17: 15:00:00 via INTRAVENOUS
  Filled 2019-02-17: qty 250

## 2019-02-17 MED ORDER — DEXAMETHASONE SODIUM PHOSPHATE 10 MG/ML IJ SOLN
INTRAMUSCULAR | Status: AC
  Filled 2019-02-17: qty 1

## 2019-02-17 MED ORDER — HEPARIN SOD (PORK) LOCK FLUSH 100 UNIT/ML IV SOLN
500.0000 [IU] | Freq: Once | INTRAVENOUS | Status: DC | PRN
  Filled 2019-02-17: qty 5

## 2019-02-17 MED ORDER — SODIUM CHLORIDE 0.9 % IV SOLN
80.0000 mg/m2 | Freq: Once | INTRAVENOUS | Status: AC
  Administered 2019-02-17: 150 mg via INTRAVENOUS
  Filled 2019-02-17: qty 7.5

## 2019-02-17 NOTE — Patient Instructions (Signed)
[facility] Discharge Instructions for Patients Receiving Chemotherapy  Today you received the following chemotherapy agents:  Etoposide.  To help prevent nausea and vomiting after your treatment, we encourage you to take your nausea medication as prescribed.   If you develop nausea and vomiting that is not controlled by your nausea medication, call the clinic.   BELOW ARE SYMPTOMS THAT SHOULD BE REPORTED IMMEDIATELY:  *FEVER GREATER THAN 100.5 F  *CHILLS WITH OR WITHOUT FEVER  NAUSEA AND VOMITING THAT IS NOT CONTROLLED WITH YOUR NAUSEA MEDICATION  *UNUSUAL SHORTNESS OF BREATH  *UNUSUAL BRUISING OR BLEEDING  TENDERNESS IN MOUTH AND THROAT WITH OR WITHOUT PRESENCE OF ULCERS  *URINARY PROBLEMS  *BOWEL PROBLEMS  UNUSUAL RASH Items with * indicate a potential emergency and should be followed up as soon as possible.  Feel free to call the clinic should you have any questions or concerns. The clinic phone number is [phone].  Please show the [facility] at check-in to the Emergency Department and triage nurse.

## 2019-02-17 NOTE — Progress Notes (Signed)
..  Neulasta Onpro Recommendation Note  Patient was contacted on 02/16/19 in regards to switching G-CSF therapy to Neulasta Onpro (pegfilgrastim). Patient was educated on the purpose of this proposed change in therapy due to COVID-19 pandemic. Patient was educated about Neulasta Onpro on-body injector and patient will be provided with an educational video while in infusion on their next scheduled date.   Based on the following circumstances, this patient would qualify for need for Neulasta Onpro (pegfilgrastim) therapy:  [x]  G-CSF is clinically necessary and warranted for this patient based on risk of neutropenia per NCCN guidelines.   [x]  Patient would benefit from decreased exposure to healthcare facility due to COVID-19 pandemic.  [x]  Patient medical oncologist preference and recommends for patient to receive Neulasta Onpro (pegfilgrastim).   Final Recommendation:   []  Patient agrees to change in therapy. Change to Neulasta Onpro.  [x]  Patient does not agree to change in therapy. No change to Neulasta Onpro at this time.    Thank You,  Wynona Neat  02/17/2019 2:41 PM

## 2019-02-17 NOTE — Progress Notes (Signed)
Pt stated he was starting to feel dizzy. Blood pressure checked and was slightly elevated from baseline. Sandi Mealy, PA notified and ok was given to continue on with infusion. Blood pressure rechecked.  Pt stated he has not eaten anything since breakfast. Pt provided with crackers.

## 2019-02-18 ENCOUNTER — Inpatient Hospital Stay: Payer: Medicaid Other

## 2019-02-18 ENCOUNTER — Ambulatory Visit: Payer: Self-pay

## 2019-02-18 ENCOUNTER — Other Ambulatory Visit: Payer: Self-pay

## 2019-02-18 VITALS — BP 152/60 | HR 62 | Temp 98.5°F | Resp 17

## 2019-02-18 DIAGNOSIS — Z7189 Other specified counseling: Secondary | ICD-10-CM

## 2019-02-18 DIAGNOSIS — C787 Secondary malignant neoplasm of liver and intrahepatic bile duct: Secondary | ICD-10-CM

## 2019-02-18 DIAGNOSIS — Z5111 Encounter for antineoplastic chemotherapy: Secondary | ICD-10-CM | POA: Diagnosis not present

## 2019-02-18 DIAGNOSIS — C801 Malignant (primary) neoplasm, unspecified: Principal | ICD-10-CM

## 2019-02-18 MED ORDER — DEXAMETHASONE SODIUM PHOSPHATE 10 MG/ML IJ SOLN
INTRAMUSCULAR | Status: AC
  Filled 2019-02-18: qty 1

## 2019-02-18 MED ORDER — SODIUM CHLORIDE 0.9% FLUSH
10.0000 mL | INTRAVENOUS | Status: DC | PRN
  Administered 2019-02-18: 10 mL
  Filled 2019-02-18: qty 10

## 2019-02-18 MED ORDER — SODIUM CHLORIDE 0.9 % IV SOLN
80.0000 mg/m2 | Freq: Once | INTRAVENOUS | Status: AC
  Administered 2019-02-18: 11:00:00 150 mg via INTRAVENOUS
  Filled 2019-02-18: qty 7.5

## 2019-02-18 MED ORDER — DEXAMETHASONE SODIUM PHOSPHATE 10 MG/ML IJ SOLN
10.0000 mg | Freq: Once | INTRAMUSCULAR | Status: AC
  Administered 2019-02-18: 10 mg via INTRAVENOUS

## 2019-02-18 MED ORDER — HEPARIN SOD (PORK) LOCK FLUSH 100 UNIT/ML IV SOLN
500.0000 [IU] | Freq: Once | INTRAVENOUS | Status: AC | PRN
  Administered 2019-02-18: 12:00:00 500 [IU]
  Filled 2019-02-18: qty 5

## 2019-02-18 MED ORDER — SODIUM CHLORIDE 0.9 % IV SOLN
Freq: Once | INTRAVENOUS | Status: AC
  Administered 2019-02-18: 10:00:00 via INTRAVENOUS
  Filled 2019-02-18: qty 250

## 2019-02-18 NOTE — Patient Instructions (Signed)
[facility] Discharge Instructions for Patients Receiving Chemotherapy  Today you received the following chemotherapy agents:  Etoposide.  To help prevent nausea and vomiting after your treatment, we encourage you to take your nausea medication as prescribed.   If you develop nausea and vomiting that is not controlled by your nausea medication, call the clinic.   BELOW ARE SYMPTOMS THAT SHOULD BE REPORTED IMMEDIATELY:  *FEVER GREATER THAN 100.5 F  *CHILLS WITH OR WITHOUT FEVER  NAUSEA AND VOMITING THAT IS NOT CONTROLLED WITH YOUR NAUSEA MEDICATION  *UNUSUAL SHORTNESS OF BREATH  *UNUSUAL BRUISING OR BLEEDING  TENDERNESS IN MOUTH AND THROAT WITH OR WITHOUT PRESENCE OF ULCERS  *URINARY PROBLEMS  *BOWEL PROBLEMS  UNUSUAL RASH Items with * indicate a potential emergency and should be followed up as soon as possible.  Feel free to call the clinic should you have any questions or concerns. The clinic phone number is [phone].  Please show the [facility] at check-in to the Emergency Department and triage nurse.

## 2019-02-19 ENCOUNTER — Inpatient Hospital Stay: Payer: Medicaid Other

## 2019-02-19 VITALS — BP 149/67 | HR 59 | Temp 98.0°F | Resp 16

## 2019-02-19 DIAGNOSIS — Z7189 Other specified counseling: Secondary | ICD-10-CM

## 2019-02-19 DIAGNOSIS — C787 Secondary malignant neoplasm of liver and intrahepatic bile duct: Secondary | ICD-10-CM

## 2019-02-19 DIAGNOSIS — Z5111 Encounter for antineoplastic chemotherapy: Secondary | ICD-10-CM | POA: Diagnosis not present

## 2019-02-19 DIAGNOSIS — C801 Malignant (primary) neoplasm, unspecified: Principal | ICD-10-CM

## 2019-02-19 MED ORDER — PEGFILGRASTIM INJECTION 6 MG/0.6ML ~~LOC~~
6.0000 mg | PREFILLED_SYRINGE | Freq: Once | SUBCUTANEOUS | Status: AC
  Administered 2019-02-19: 6 mg via SUBCUTANEOUS

## 2019-02-19 MED ORDER — PEGFILGRASTIM INJECTION 6 MG/0.6ML ~~LOC~~
PREFILLED_SYRINGE | SUBCUTANEOUS | Status: AC
  Filled 2019-02-19: qty 0.6

## 2019-02-19 NOTE — Patient Instructions (Signed)
Pegfilgrastim injection  What is this medicine?  PEGFILGRASTIM (PEG fil gra stim) is a long-acting granulocyte colony-stimulating factor that stimulates the growth of neutrophils, a type of white blood cell important in the body's fight against infection. It is used to reduce the incidence of fever and infection in patients with certain types of cancer who are receiving chemotherapy that affects the bone marrow, and to increase survival after being exposed to high doses of radiation.  This medicine may be used for other purposes; ask your health care provider or pharmacist if you have questions.  COMMON BRAND NAME(S): Fulphila, Neulasta, UDENYCA  What should I tell my health care provider before I take this medicine?  They need to know if you have any of these conditions:  -kidney disease  -latex allergy  -ongoing radiation therapy  -sickle cell disease  -skin reactions to acrylic adhesives (On-Body Injector only)  -an unusual or allergic reaction to pegfilgrastim, filgrastim, other medicines, foods, dyes, or preservatives  -pregnant or trying to get pregnant  -breast-feeding  How should I use this medicine?  This medicine is for injection under the skin. If you get this medicine at home, you will be taught how to prepare and give the pre-filled syringe or how to use the On-body Injector. Refer to the patient Instructions for Use for detailed instructions. Use exactly as directed. Tell your healthcare provider immediately if you suspect that the On-body Injector may not have performed as intended or if you suspect the use of the On-body Injector resulted in a missed or partial dose.  It is important that you put your used needles and syringes in a special sharps container. Do not put them in a trash can. If you do not have a sharps container, call your pharmacist or healthcare provider to get one.  Talk to your pediatrician regarding the use of this medicine in children. While this drug may be prescribed for  selected conditions, precautions do apply.  Overdosage: If you think you have taken too much of this medicine contact a poison control center or emergency room at once.  NOTE: This medicine is only for you. Do not share this medicine with others.  What if I miss a dose?  It is important not to miss your dose. Call your doctor or health care professional if you miss your dose. If you miss a dose due to an On-body Injector failure or leakage, a new dose should be administered as soon as possible using a single prefilled syringe for manual use.  What may interact with this medicine?  Interactions have not been studied.  Give your health care provider a list of all the medicines, herbs, non-prescription drugs, or dietary supplements you use. Also tell them if you smoke, drink alcohol, or use illegal drugs. Some items may interact with your medicine.  This list may not describe all possible interactions. Give your health care provider a list of all the medicines, herbs, non-prescription drugs, or dietary supplements you use. Also tell them if you smoke, drink alcohol, or use illegal drugs. Some items may interact with your medicine.  What should I watch for while using this medicine?  You may need blood work done while you are taking this medicine.  If you are going to need a MRI, CT scan, or other procedure, tell your doctor that you are using this medicine (On-Body Injector only).  What side effects may I notice from receiving this medicine?  Side effects that you should report to   your doctor or health care professional as soon as possible:  -allergic reactions like skin rash, itching or hives, swelling of the face, lips, or tongue  -back pain  -dizziness  -fever  -pain, redness, or irritation at site where injected  -pinpoint red spots on the skin  -red or dark-brown urine  -shortness of breath or breathing problems  -stomach or side pain, or pain at the shoulder  -swelling  -tiredness  -trouble passing urine or  change in the amount of urine  Side effects that usually do not require medical attention (report to your doctor or health care professional if they continue or are bothersome):  -bone pain  -muscle pain  This list may not describe all possible side effects. Call your doctor for medical advice about side effects. You may report side effects to FDA at 1-800-FDA-1088.  Where should I keep my medicine?  Keep out of the reach of children.  If you are using this medicine at home, you will be instructed on how to store it. Throw away any unused medicine after the expiration date on the label.  NOTE: This sheet is a summary. It may not cover all possible information. If you have questions about this medicine, talk to your doctor, pharmacist, or health care provider.   2019 [employer] (2018-01-25 16:57:08)

## 2019-03-07 ENCOUNTER — Encounter: Payer: Self-pay | Admitting: Hematology

## 2019-03-08 ENCOUNTER — Other Ambulatory Visit: Payer: Self-pay | Admitting: *Deleted

## 2019-03-08 NOTE — Telephone Encounter (Signed)
MyChart message requesting refill of Oxycodone received 5/4

## 2019-03-09 ENCOUNTER — Encounter: Payer: Self-pay | Admitting: Hematology

## 2019-03-09 ENCOUNTER — Other Ambulatory Visit: Payer: Self-pay | Admitting: Hematology

## 2019-03-09 MED ORDER — OXYCODONE HCL 5 MG PO TABS: 5.0000 mg | ORAL_TABLET | ORAL | 0 refills | Status: DC | PRN

## 2019-03-15 ENCOUNTER — Other Ambulatory Visit: Payer: Self-pay

## 2019-03-15 ENCOUNTER — Ambulatory Visit
Admission: RE | Admit: 2019-03-15 | Discharge: 2019-03-15 | Disposition: A | Discharge status: Home / Self Care | Payer: Medicaid Other | Source: Ambulatory Visit | Attending: Hematology | Admitting: Hematology

## 2019-03-15 DIAGNOSIS — C801 Malignant (primary) neoplasm, unspecified: Secondary | ICD-10-CM

## 2019-03-15 DIAGNOSIS — C787 Secondary malignant neoplasm of liver and intrahepatic bile duct: Secondary | ICD-10-CM

## 2019-03-15 DIAGNOSIS — C7951 Secondary malignant neoplasm of bone: Secondary | ICD-10-CM

## 2019-03-15 MED ORDER — IOPAMIDOL (ISOVUE-300) INJECTION 61%
100.0000 mL | Freq: Once | INTRAVENOUS | Status: AC | PRN
  Administered 2019-03-15: 100 mL via INTRAVENOUS

## 2019-03-22 NOTE — Progress Notes (Signed)
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  HISTORY OF PRESENTING ILLNESS:   [name] is a wonderful 58 y.o. male who has been referred to [place] by Dr. Francine Graven for evaluation and management of Liver masses. He is accompanied today by his wife and aunt. The pt reports that he is doing well overall.   The pt presented to the ED on 10/01/18 regarding persistent RUQ abdominal pain which began a month prior, and noted associations with nausea and 40 pound weight loss. He was evaluated with a CT A/P, as noted below, which revealed liver masses.   The pt reports that he first began feeling RUQ abdominal pain about 3-4 weeks ago, but began losing weight about 2 months ago. He notes that he began feeling more fatigue about 2 months ago as well.  The pt notes that the color of his bowels became dark black and denies taking Iron pills or bismuth medications. He notes that he began feeling constipated as well, feeling backed up. He notes that his stools began looking thinner in caliber. The pt notes that his appetite began decreasing over the last 3-4 weeks ago, and some of this he attributes to intermittent nausea. He denies problems swallowing food. He endorses RUQ pain, and denies vomiting and new cough, new bone pains, testicular pain or swelling, problems passing urine, blood in the urine. The pt has never had a colonoscopy and does not have a PCP, and cites his lack of health insurance as the reason.  The pt was released from the ED with Oxycodone and has been using this to treat his RUQ pain successfully.   The pt notes gout and a heart murmur as previous medical history. He notes that he hasn't consumed alcohol in the past 2 years whatsoever, but prior to this alcohol was a problem for him.    Of note prior to the patient's visit today, pt has had a CT A/P completed on 10/01/18 with results revealing Interval development of numerous ill-defined hypodense masses throughout the entire liver compatible with metastatic disease. There is a 2.5 cm enhancing mass over the upper pole cortex of the right kidney suspicious for renal cell carcinoma. 2.  No acute findings in the abdomen/pelvis. 3. 1 cm hypodensity over the pancreatic tail not well seen on previous noncontrast exams. MR may be helpful for further evaluation on an elective basis.   Most recent lab results (10/06/18) of CBC w/diff and CMP is as follows: all values are WNL except for Monocytes abs at 1.2k, Sodium at 134, Albumin at 3.0, AST at 77, ALT at 63, Alk Phos at 392, Total Bilirubin at 2.3   On review of systems, pt reports new constipation, smaller caliber stools, dark stools, unexpected weight loss, RUQ pain, new fatigue, and denies vomiting, cough, new SOB, changes in breathing, new bone pains, leg swelling, blood in the urine, changes in urination, fevers, pain along the spine, and any other symptoms.   On PMHx the pt reports Gout, heart murmur, previous alcohol abuse. He denies any surgeries.  On Social Hx the pt reports that he hasn't consumed alcohol in the past 2 years whatsoever. He notes that alcohol abuse was previously a concern. He denies ever smoking cigarettes or doing drugs. He denies any radiation or chemical exposure.  On Family Hx the  pt reports mother with pancreatic cancer in 40s, maternal aunts with high blood pressure and heart failure, maternal aunt with breast cancer.  Interval History:  [name] returns today for management, evaluation, after C6 treatment of his recently diagnosed High grade neuroendocrine carcinoma. The patient's last visit with [place] was on 02/16/19. The pt reports that he is doing well overall. He is accompanied by his brother [name], via [employer].  The pt reports that he has not  developed any new concerns in the interim. He notes that he has continued eating well and has continued gaining weight. He also endorses good energy levels. The pt has continued in his absolute alcohol cessation and does not smoke.   The pt notes that about a week ago, he felt some flank discomfort on his right side which was present for 4 days and then resolved on its own. He notes that this could have been muscular but was concerned for a kidney stone, as he has had these in the past. He notes that he has not been consuming lots of water but has been consuming more sodas in general.  Of note since the patient's last visit, pt has had CT C/A/P completed on 03/15/19 with results revealing "Significant improvement in multifocal liver metastasis. 2. Interval development of multifocal areas of sclerosis within the visualized axial and proximal appendicular skeleton. These are favored to represent areas of healing bone metastases. 3. Stable lesion within distal tail of pancreas. 4.  Aortic Atherosclerosis."  On review of systems, pt reports eating well, weight gain, good energy levels, good appetite, and denies concerns for infections, and any other symptoms.   MEDICAL HISTORY:  Past Medical History:  Diagnosis Date   Arthritis    "knees; left hand"   Cancer ([place])    Gout    Heart murmur    Longstanding. Mild aortic valve thickening with trivial AR by echo 02/2012   Hypertension    Near syncope    05/2011 with tachypalpitations & with echo showing mild LVH, normal EF 55-60%, trivial AR    SURGICAL HISTORY: Past Surgical History:  Procedure Laterality Date   IR IMAGING GUIDED PORT INSERTION  10/26/2018   NO PAST SURGERIES      SOCIAL HISTORY: Social History   Socioeconomic History   Marital status: Married    Spouse name: Not on file   Number of children: 1   Years of education: Not on file   Highest education level: Not on file  Occupational History   Occupation:  Unemployed  Scientist, product/process development strain: Not on file   Food insecurity:    Worry: Not on file    Inability: Not on file   Transportation needs:    Medical: Not on file    Non-medical: Not on file  Tobacco Use   Smoking status: Never Smoker   Smokeless tobacco: Never Used  Substance and Sexual Activity   Alcohol use: Not Currently    Comment: states he has cut back/ every now and again   Drug use: No   Sexual activity: Yes    Partners: Female  Lifestyle   Physical activity:    Days per week: Not on file    Minutes per session: Not on file   Stress: Not on file  Relationships   Social connections:    Talks on phone: Not on file    Gets together: Not on file    Attends religious service: Not on file  Active member of club or organization: Not on file    Attends meetings of clubs or organizations: Not on file    Relationship status: Not on file   Intimate partner violence:    Fear of current or ex partner: Not on file    Emotionally abused: Not on file    Physically abused: Not on file    Forced sexual activity: Not on file  Other Topics Concern   Not on file  Social History Narrative   Married. 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 Is from [place].     FAMILY HISTORY: Family History  Problem Relation Age of Onset   Cancer Mother        Possibly pancreatic. Died at age 63   Prostate cancer Father    Breast cancer Cousin    Diabetes Paternal Uncle    Heart disease Paternal Uncle     ALLERGIES:  has No Known Allergies.  MEDICATIONS:  Current Outpatient Medications  Medication Sig Dispense Refill   allopurinol (ZYLOPRIM) 100 MG tablet Take 2 tablets (200 mg total) by mouth daily. 60 tablet 1   clindamycin (CLEOCIN) 300 MG capsule Take 1 capsule (300 mg total) by mouth 4 (four) times daily. X 7 days 28 capsule 0   colchicine 0.6 MG tablet Take 1 tablet (0.6 mg total) by mouth See  admin instructions. Take 1.'2mg'$  (2 tablets) followed by .'6mg'$  (1 tablet) 1 hour later. (Patient not taking: Reported on 12/11/2018) 3 tablet 0   dexamethasone (DECADRON) 4 MG tablet '4mg'$  po daily with breakfast for 2 weeks then every other day for 2 weeks 30 tablet 0   furosemide (LASIX) 20 MG tablet Take 1 tablet (20 mg total) by mouth daily. 30 tablet 1   lidocaine-prilocaine (EMLA) cream Apply to affected area once (Patient not taking: Reported on 12/11/2018) 30 g 3   LORazepam (ATIVAN) 0.5 MG tablet Take 1 tablet (0.5 mg total) by mouth every 6 (six) hours as needed (Nausea or vomiting). 30 tablet 0   ondansetron (ZOFRAN ODT) 4 MG disintegrating tablet Take 1 tablet (4 mg total) by mouth every 8 (eight) hours as needed for nausea or vomiting. 6 tablet 0   oxyCODONE (OXY IR/ROXICODONE) 5 MG immediate release tablet Take 1-2 tablets (5-10 mg total) by mouth every 4 (four) hours as needed for severe pain. 90 tablet 0   potassium chloride SA (K-DUR,KLOR-CON) 20 MEQ tablet Take 1 tablet (20 mEq total) by mouth daily. With lasix 30 tablet 1   prochlorperazine (COMPAZINE) 10 MG tablet Take 1 tablet (10 mg total) by mouth every 6 (six) hours as needed (Nausea or vomiting). 30 tablet 1   No current facility-administered medications for this visit.    Facility-Administered Medications Ordered in Other Visits  Medication Dose Route Frequency Provider Last Rate Last Dose   denosumab (XGEVA) injection 120 mg  120 mg Subcutaneous Once Irene Limbo, Cloria Spring, MD        REVIEW OF SYSTEMS:    A 10+ POINT REVIEW OF SYSTEMS WAS OBTAINED including neurology, dermatology, psychiatry, cardiac, respiratory, lymph, extremities, GI, GU, Musculoskeletal, constitutional, breasts, reproductive, HEENT.  All pertinent positives are noted in the HPI.  All others are negative.   PHYSICAL EXAMINATION: ECOG PERFORMANCE STATUS: 1 - Symptomatic but completely ambulatory  Vitals:   03/23/19 1104  BP: (!) 143/74  Pulse: 74   Resp: 17  Temp: 98.3 F (36.8 C)  SpO2: 100%   Filed Weights  03/23/19 1104  Weight: 180 lb 3.2 oz (81.7 kg)   .Body mass index is 28.22 kg/m.   GENERAL:alert, in no acute distress and comfortable SKIN: no acute rashes, no significant lesions EYES: conjunctiva are pink and non-injected, sclera anicteric OROPHARYNX: MMM, no exudates, no oropharyngeal erythema or ulceration NECK: supple, no JVD LYMPH:  no palpable lymphadenopathy in the cervical, axillary or inguinal regions LUNGS: clear to auscultation b/l with normal respiratory effort HEART: regular rate & rhythm ABDOMEN:  normoactive bowel sounds , non tender, not distended. Improving hepatomegaly, now palpable 3cm under right mid-costal margin line Extremity: no pedal edema PSYCH: alert & oriented x 3 with fluent speech NEURO: no focal motor/sensory deficits   LABORATORY DATA:  I have reviewed the data as listed  . CBC Latest Ref Rng & Units 03/23/2019 02/16/2019 01/26/2019  WBC 4.0 - 10.5 K/uL 5.3 10.0 17.1(H)  Hemoglobin 13.0 - 17.0 g/dL 12.2(L) 11.1(L) 10.3(L)  Hematocrit 39.0 - 52.0 % 38.0(L) 35.2(L) 33.3(L)  Platelets 150 - 400 K/uL 113(L) 122(L) 166    . CMP Latest Ref Rng & Units 03/23/2019 02/16/2019 01/26/2019  Glucose 70 - 99 mg/dL 155(H) 226(H) 147(H)  BUN 6 - 20 mg/dL '11 9 10  '$ Creatinine 0.61 - 1.24 mg/dL 0.93 0.85 0.77  Sodium 135 - 145 mmol/L 139 139 142  Potassium 3.5 - 5.1 mmol/L 4.0 4.0 3.8  Chloride 98 - 111 mmol/L 102 104 105  CO2 22 - 32 mmol/L '28 26 28  '$ Calcium 8.9 - 10.3 mg/dL 9.7 8.9 8.7(L)  Total Protein 6.5 - 8.1 g/dL 7.9 7.0 6.8  Total Bilirubin 0.3 - 1.2 mg/dL 0.4 0.3 0.4  Alkaline Phos 38 - 126 U/L 120 105 111  AST 15 - 41 U/L 16 16 14(L)  ALT 0 - 44 U/L '18 15 14   '$ . Lab Results  Component Value Date   LDH 176 03/23/2019    10/08/18 Liver Biopsy:    RADIOGRAPHIC STUDIES: I have personally reviewed the radiological images as listed and agreed with the findings in the report. Ct  Chest W Contrast  Result Date: 03/16/2019 CLINICAL DATA:  Follow-up neuroendocrine tumor EXAM: CT CHEST, ABDOMEN, AND PELVIS WITH CONTRAST TECHNIQUE: Multidetector CT imaging of the chest, abdomen and pelvis was performed following the standard protocol during bolus administration of intravenous contrast. CONTRAST:  177m ISOVUE-300 IOPAMIDOL (ISOVUE-300) INJECTION 61% COMPARISON:  10/31/2018 FINDINGS: CT CHEST FINDINGS Cardiovascular: Normal heart size.  No pericardial effusion. Mediastinum/Nodes: Normal appearance of the thyroid gland. The trachea appears patent and is midline. Normal appearance of the esophagus. No mediastinal or hilar adenopathy. No axillary or supraclavicular adenopathy. Lungs/Pleura: Right upper lobe lung nodule is unchanged measuring 3 mm, image 63/6. No new suspicious pulmonary nodules identified. Musculoskeletal: There are multifocal areas of increase sclerosis involving the thoracic spine, sternum and proximal left humerus. These are new when compared with 10/31/2018 and are favored to represent areas of healing bone metastases. For example, large area of sclerosis involving the right side of the T11 vertebral body is identified, image 52/2. On the previous PET-CT from 10/15/2018 there is a corresponding area of increased radiotracer uptake. Similarly, there is increased sclerosis within the left humeral head which corresponds to previously noted hypermetabolic metastasis. CT ABDOMEN PELVIS FINDINGS Hepatobiliary: There is been significant interval decrease in tumor burden within the liver. For example, the index lesion within lateral segment of left lobe of liver measures 3.6 cm on today's exam. Previously 8.3 cm. Index lesion within segment 4B of the  liver measures 2.9 cm, image 65/2. Previously 4.7 cm. Segment 5 index lesion measures 1.9 cm, image 64/2. Previously 8.2 cm. Gallbladder normal. No biliary dilatation. Pancreas: No inflammation or main duct dilatation. The distal tail of  pancreas lesion measures 1.2 cm, image 59/2. Unchanged. Spleen: Calcified granulomas again noted within the spleen. Adrenals/Urinary Tract: Normal appearance of the adrenal glands. No kidney mass or hydronephrosis. Urinary bladder normal. Stomach/Bowel: Stomach is unremarkable. The small bowel loops have a normal course and caliber without obstruction. Appendix normal. Normal appearance of the colon. Vascular/Lymphatic: Aortic atherosclerosis. No aneurysm. No abdominopelvic adenopathy. Reproductive: Prostate is unremarkable. Other: No abdominal wall hernia or abnormality. No abdominopelvic ascites. Musculoskeletal: New multifocal areas of sclerosis are identified, favored to represent areas of healing bone metastases. IMPRESSION: 1. Significant improvement in multifocal liver metastasis. 2. Interval development of multifocal areas of sclerosis within the visualized axial and proximal appendicular skeleton. These are favored to represent areas of healing bone metastases. 3. Stable lesion within distal tail of pancreas. 4.  Aortic Atherosclerosis (ICD10-I70.0). Electronically Signed   By: Kerby Moors M.D.   On: 03/16/2019 11:57   Ct Abdomen Pelvis W Contrast  Result Date: 03/16/2019 CLINICAL DATA:  Follow-up neuroendocrine tumor EXAM: CT CHEST, ABDOMEN, AND PELVIS WITH CONTRAST TECHNIQUE: Multidetector CT imaging of the chest, abdomen and pelvis was performed following the standard protocol during bolus administration of intravenous contrast. CONTRAST:  153m ISOVUE-300 IOPAMIDOL (ISOVUE-300) INJECTION 61% COMPARISON:  10/31/2018 FINDINGS: CT CHEST FINDINGS Cardiovascular: Normal heart size.  No pericardial effusion. Mediastinum/Nodes: Normal appearance of the thyroid gland. The trachea appears patent and is midline. Normal appearance of the esophagus. No mediastinal or hilar adenopathy. No axillary or supraclavicular adenopathy. Lungs/Pleura: Right upper lobe lung nodule is unchanged measuring 3 mm, image  63/6. No new suspicious pulmonary nodules identified. Musculoskeletal: There are multifocal areas of increase sclerosis involving the thoracic spine, sternum and proximal left humerus. These are new when compared with 10/31/2018 and are favored to represent areas of healing bone metastases. For example, large area of sclerosis involving the right side of the T11 vertebral body is identified, image 52/2. On the previous PET-CT from 10/15/2018 there is a corresponding area of increased radiotracer uptake. Similarly, there is increased sclerosis within the left humeral head which corresponds to previously noted hypermetabolic metastasis. CT ABDOMEN PELVIS FINDINGS Hepatobiliary: There is been significant interval decrease in tumor burden within the liver. For example, the index lesion within lateral segment of left lobe of liver measures 3.6 cm on today's exam. Previously 8.3 cm. Index lesion within segment 4B of the liver measures 2.9 cm, image 65/2. Previously 4.7 cm. Segment 5 index lesion measures 1.9 cm, image 64/2. Previously 8.2 cm. Gallbladder normal. No biliary dilatation. Pancreas: No inflammation or main duct dilatation. The distal tail of pancreas lesion measures 1.2 cm, image 59/2. Unchanged. Spleen: Calcified granulomas again noted within the spleen. Adrenals/Urinary Tract: Normal appearance of the adrenal glands. No kidney mass or hydronephrosis. Urinary bladder normal. Stomach/Bowel: Stomach is unremarkable. The small bowel loops have a normal course and caliber without obstruction. Appendix normal. Normal appearance of the colon. Vascular/Lymphatic: Aortic atherosclerosis. No aneurysm. No abdominopelvic adenopathy. Reproductive: Prostate is unremarkable. Other: No abdominal wall hernia or abnormality. No abdominopelvic ascites. Musculoskeletal: New multifocal areas of sclerosis are identified, favored to represent areas of healing bone metastases. IMPRESSION: 1. Significant improvement in multifocal  liver metastasis. 2. Interval development of multifocal areas of sclerosis within the visualized axial and proximal appendicular skeleton. These are favored  to represent areas of healing bone metastases. 3. Stable lesion within distal tail of pancreas. 4.  Aortic Atherosclerosis (ICD10-I70.0). Electronically Signed   By: Kerby Moors M.D.   On: 03/16/2019 11:57    ASSESSMENT & PLAN:  58 y.o. male with  1. 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 There is a 2.5 cm enhancing mass over the upper pole cortex of the right kidney suspicious for renal cell carcinoma. 2.  No acute findings in the abdomen/pelvis. 3. 1 cm hypodensity over the pancreatic tail not well seen on previous noncontrast exams. MR may be helpful for further evaluation on an elective basis.    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 2. Upper pole right renal lesion is not definitely hypermetabolic and has been present since 2012. I think is unlikely the cause of the metastatic disease. Recommend liver biopsy for tissue diagnosis. If needed, an MRI of the abdomen without and with contrast may be helpful for further evaluation of the renal lesion. 3. Two sub 3 mm right upper lobe pulmonary nodules are indeterminate.   10/26/18 MRI Brain revealed No acute abnormality and negative for metastatic disease.  S/p 6 cycles of Carboplatin and 20% dose reduced Etoposide completed on 02/19/19. Dose reduced as pt developed significant thrombocytopenia after C1.  2.  Abnormal LFTs - likely from liver metastases  Labs upon initial presentation from 10/06/18, some borderline monocytosis at 1.2k, otherwise normal blood counts, AST at 77, ALT at 63, Alk Phos at 392, Total Bilirubin at 2.3   3. Rt renal mass - concerning for RCC seen on 10/15/18 PET/CT but no longer observed on 03/15/19 CT C/A/P  PLAN:  -Discussed pt labwork today, 03/23/19; -Discussed the 03/15/19 CT C/A/P which revealed "Significant improvement in multifocal liver metastasis. 2. Interval development of multifocal areas of sclerosis within the visualized axial and proximal appendicular skeleton. These are favored to represent areas of healing bone metastases. 3. Stable lesion within distal tail of pancreas. 4. Aortic Atherosclerosis."  -Reviewed the NCCN guidelines with the pt and his brother which recommend clinical examinations every 2-3 months and repeat CT scans every 3-4 months. If progression is seen, would then consider second line palliative treatments and/or RT. -Discussed that there is not clear guidance for maintenance immunotherapy in this setting. Would be reasonable to consider and look for clinical trials. -Will pursue MSI MMR testing if enough sample, to consider possibility of maintenance immunotherapy -Continue with compression socks and leg elevation  -Lasix as needed, with Potassium replacement  -Continue high protein and high calorie meal supplements such as Boost or Ensure -Strongly recommended that the pt not lift, push, or pull anything to prevent the risk of pathologic fractures as the PET/CT does indicate bone involvement -Continue Xgeva every 4 weeks -Oxycodone for pain mx, up to every 6 hours -Recommended that the pt continue to eat well, drink at least 48-64 oz of water each day, and walk 20-30 minutes each day. -Will see the pt back in 2 months  Labs today RTC with Dr Irene Limbo with labs in 2 months   All of the  patients questions were answered with apparent  satisfaction. The patient knows to call the clinic with any problems, questions or concerns.  The total time spent in the appt was 25 minutes and more than 50% was on counseling and direct patient cares.    Sullivan Lone MD MS AAHIVMS [facility] Hematology/Oncology Physician [facility]  (Office):       [phone] (Work cell):  [phone] (Fax):           [phone]  03/23/2019 12:02 PM  I, Baldwin Jamaica, am acting as a scribe for Dr. Sullivan Lone.   .I have reviewed the above documentation for accuracy and completeness, and I agree with the above. Brunetta Genera MD

## 2019-03-23 ENCOUNTER — Telehealth: Payer: Self-pay | Admitting: Hematology

## 2019-03-23 ENCOUNTER — Inpatient Hospital Stay: Payer: Medicaid Other

## 2019-03-23 ENCOUNTER — Inpatient Hospital Stay: Payer: Medicaid Other | Attending: Hematology | Admitting: Hematology

## 2019-03-23 ENCOUNTER — Other Ambulatory Visit: Payer: Self-pay

## 2019-03-23 VITALS — BP 143/74 | HR 74 | Temp 98.3°F | Resp 17 | Ht 67.0 in | Wt 180.2 lb

## 2019-03-23 DIAGNOSIS — N2889 Other specified disorders of kidney and ureter: Secondary | ICD-10-CM | POA: Diagnosis not present

## 2019-03-23 DIAGNOSIS — C7951 Secondary malignant neoplasm of bone: Secondary | ICD-10-CM

## 2019-03-23 DIAGNOSIS — C7A1 Malignant poorly differentiated neuroendocrine tumors: Secondary | ICD-10-CM | POA: Diagnosis not present

## 2019-03-23 DIAGNOSIS — Z5111 Encounter for antineoplastic chemotherapy: Secondary | ICD-10-CM | POA: Insufficient documentation

## 2019-03-23 DIAGNOSIS — C801 Malignant (primary) neoplasm, unspecified: Secondary | ICD-10-CM

## 2019-03-23 DIAGNOSIS — R945 Abnormal results of liver function studies: Secondary | ICD-10-CM

## 2019-03-23 DIAGNOSIS — C787 Secondary malignant neoplasm of liver and intrahepatic bile duct: Secondary | ICD-10-CM

## 2019-03-23 DIAGNOSIS — C7B8 Other secondary neuroendocrine tumors: Secondary | ICD-10-CM | POA: Diagnosis not present

## 2019-03-23 DIAGNOSIS — Z79899 Other long term (current) drug therapy: Secondary | ICD-10-CM | POA: Diagnosis not present

## 2019-03-23 LAB — CBC WITH DIFFERENTIAL/PLATELET
Abs Immature Granulocytes: 0.02 10*3/uL (ref 0.00–0.07)
Basophils Absolute: 0 10*3/uL (ref 0.0–0.1)
Basophils Relative: 1 %
Eosinophils Absolute: 0.1 10*3/uL (ref 0.0–0.5)
Eosinophils Relative: 2 %
HCT: 38 % — ABNORMAL LOW (ref 39.0–52.0)
Hemoglobin: 12.2 g/dL — ABNORMAL LOW (ref 13.0–17.0)
Immature Granulocytes: 0 %
Lymphocytes Relative: 20 %
Lymphs Abs: 1.1 10*3/uL (ref 0.7–4.0)
MCH: 28 pg (ref 26.0–34.0)
MCHC: 32.1 g/dL (ref 30.0–36.0)
MCV: 87.4 fL (ref 80.0–100.0)
Monocytes Absolute: 0.9 10*3/uL (ref 0.1–1.0)
Monocytes Relative: 17 %
Neutro Abs: 3.2 10*3/uL (ref 1.7–7.7)
Neutrophils Relative %: 60 %
Platelets: 113 10*3/uL — ABNORMAL LOW (ref 150–400)
RBC: 4.35 MIL/uL (ref 4.22–5.81)
RDW: 13.1 % (ref 11.5–15.5)
WBC: 5.3 10*3/uL (ref 4.0–10.5)
nRBC: 0 % (ref 0.0–0.2)

## 2019-03-23 LAB — CMP (CANCER CENTER ONLY)
ALT: 18 U/L (ref 0–44)
AST: 16 U/L (ref 15–41)
Albumin: 3.9 g/dL (ref 3.5–5.0)
Alkaline Phosphatase: 120 U/L (ref 38–126)
Anion gap: 9 (ref 5–15)
BUN: 11 mg/dL (ref 6–20)
CO2: 28 mmol/L (ref 22–32)
Calcium: 9.7 mg/dL (ref 8.9–10.3)
Chloride: 102 mmol/L (ref 98–111)
Creatinine: 0.93 mg/dL (ref 0.61–1.24)
GFR, Est AFR Am: >60 mL/min (ref >60)
GFR, Estimated: >60 mL/min (ref >60)
Glucose, Bld: 155 mg/dL — ABNORMAL HIGH (ref 70–99)
Potassium: 4 mmol/L (ref 3.5–5.1)
Sodium: 139 mmol/L (ref 135–145)
Total Bilirubin: 0.4 mg/dL (ref 0.3–1.2)
Total Protein: 7.9 g/dL (ref 6.5–8.1)

## 2019-03-23 LAB — LACTATE DEHYDROGENASE: LDH: 176 U/L (ref 98–192)

## 2019-03-23 NOTE — Telephone Encounter (Signed)
Scheduled appt per 5/20 los.  Left a VM of scheduled appt date and time.

## 2019-03-29 ENCOUNTER — Encounter: Payer: Self-pay | Admitting: Hematology

## 2019-03-29 ENCOUNTER — Other Ambulatory Visit: Payer: Self-pay | Admitting: *Deleted

## 2019-03-29 MED ORDER — OXYCODONE HCL 5 MG PO TABS: 5.0000 mg | ORAL_TABLET | ORAL | 0 refills | Status: DC | PRN

## 2019-03-29 NOTE — Telephone Encounter (Signed)
Sent MyChart request for Oxycodone and prednisone

## 2019-04-04 ENCOUNTER — Telehealth: Payer: Self-pay | Admitting: *Deleted

## 2019-04-04 NOTE — Telephone Encounter (Signed)
Contacted wife. Patient needs to provide one vial of blood to complete requirements for pathology test ([facility]). Wife says she can bring him on Wednesday morning at [place] made for that time.

## 2019-04-06 ENCOUNTER — Other Ambulatory Visit: Payer: Self-pay

## 2019-04-06 ENCOUNTER — Other Ambulatory Visit (HOSPITAL_COMMUNITY)
Admission: RE | Admit: 2019-04-06 | Discharge: 2019-04-06 | Disposition: A | Discharge status: Home / Self Care | Payer: Medicaid Other | Source: Ambulatory Visit | Attending: Hematology | Admitting: Hematology

## 2019-04-06 ENCOUNTER — Inpatient Hospital Stay: Payer: Medicaid Other | Attending: Hematology

## 2019-04-06 DIAGNOSIS — C7B8 Other secondary neuroendocrine tumors: Secondary | ICD-10-CM | POA: Diagnosis not present

## 2019-04-13 ENCOUNTER — Encounter: Payer: Self-pay | Admitting: *Deleted

## 2019-04-16 ENCOUNTER — Encounter: Payer: Self-pay | Admitting: Hematology

## 2019-04-18 ENCOUNTER — Other Ambulatory Visit: Payer: Self-pay | Admitting: *Deleted

## 2019-04-18 MED ORDER — OXYCODONE HCL 5 MG PO TABS: 5.0000 mg | ORAL_TABLET | ORAL | 0 refills | Status: DC | PRN

## 2019-04-18 NOTE — Telephone Encounter (Signed)
Request for pain medication refill received via Mychart.

## 2019-04-19 ENCOUNTER — Encounter: Payer: Self-pay | Admitting: Hematology

## 2019-05-10 ENCOUNTER — Emergency Department (HOSPITAL_COMMUNITY): Payer: Medicaid Other

## 2019-05-10 ENCOUNTER — Encounter (HOSPITAL_COMMUNITY): Payer: Self-pay

## 2019-05-10 ENCOUNTER — Other Ambulatory Visit: Payer: Self-pay

## 2019-05-10 ENCOUNTER — Emergency Department (HOSPITAL_COMMUNITY)
Admission: EM | Admit: 2019-05-10 | Discharge: 2019-05-10 | Disposition: A | Discharge status: Home / Self Care | Payer: Medicaid Other | Attending: Emergency Medicine | Admitting: Emergency Medicine

## 2019-05-10 DIAGNOSIS — R0602 Shortness of breath: Secondary | ICD-10-CM | POA: Insufficient documentation

## 2019-05-10 DIAGNOSIS — I10 Essential (primary) hypertension: Secondary | ICD-10-CM | POA: Diagnosis not present

## 2019-05-10 DIAGNOSIS — Z8505 Personal history of malignant neoplasm of liver: Secondary | ICD-10-CM | POA: Diagnosis not present

## 2019-05-10 DIAGNOSIS — Z85830 Personal history of malignant neoplasm of bone: Secondary | ICD-10-CM | POA: Insufficient documentation

## 2019-05-10 DIAGNOSIS — Z79899 Other long term (current) drug therapy: Secondary | ICD-10-CM | POA: Diagnosis not present

## 2019-05-10 LAB — CBC WITH DIFFERENTIAL/PLATELET
Abs Immature Granulocytes: 0.01 10*3/uL (ref 0.00–0.07)
Basophils Absolute: 0 10*3/uL (ref 0.0–0.1)
Basophils Relative: 1 %
Eosinophils Absolute: 0.2 10*3/uL (ref 0.0–0.5)
Eosinophils Relative: 5 %
HCT: 39.2 % (ref 39.0–52.0)
Hemoglobin: 12.2 g/dL — ABNORMAL LOW (ref 13.0–17.0)
Immature Granulocytes: 0 %
Lymphocytes Relative: 28 %
Lymphs Abs: 1.3 10*3/uL (ref 0.7–4.0)
MCH: 26.4 pg (ref 26.0–34.0)
MCHC: 31.1 g/dL (ref 30.0–36.0)
MCV: 84.8 fL (ref 80.0–100.0)
Monocytes Absolute: 0.5 10*3/uL (ref 0.1–1.0)
Monocytes Relative: 12 %
Neutro Abs: 2.5 10*3/uL (ref 1.7–7.7)
Neutrophils Relative %: 54 %
Platelets: 146 10*3/uL — ABNORMAL LOW (ref 150–400)
RBC: 4.62 MIL/uL (ref 4.22–5.81)
RDW: 13.7 % (ref 11.5–15.5)
WBC: 4.6 10*3/uL (ref 4.0–10.5)
nRBC: 0 % (ref 0.0–0.2)

## 2019-05-10 LAB — BASIC METABOLIC PANEL
Anion gap: 10 (ref 5–15)
BUN: 14 mg/dL (ref 6–20)
CO2: 22 mmol/L (ref 22–32)
Calcium: 9.7 mg/dL (ref 8.9–10.3)
Chloride: 104 mmol/L (ref 98–111)
Creatinine, Ser: 0.87 mg/dL (ref 0.61–1.24)
GFR calc Af Amer: >60 mL/min (ref >60)
GFR calc non Af Amer: >60 mL/min (ref >60)
Glucose, Bld: 158 mg/dL — ABNORMAL HIGH (ref 70–99)
Potassium: 3.8 mmol/L (ref 3.5–5.1)
Sodium: 136 mmol/L (ref 135–145)

## 2019-05-10 LAB — BRAIN NATRIURETIC PEPTIDE: B Natriuretic Peptide: 40.1 pg/mL (ref 0.0–100.0)

## 2019-05-10 LAB — TROPONIN I (HIGH SENSITIVITY): Troponin I (High Sensitivity): 5 ng/L (ref <18)

## 2019-05-10 MED ORDER — IOHEXOL 350 MG/ML SOLN
100.0000 mL | Freq: Once | INTRAVENOUS | Status: AC | PRN
  Administered 2019-05-10: 100 mL via INTRAVENOUS

## 2019-05-10 MED ORDER — SODIUM CHLORIDE (PF) 0.9 % IJ SOLN
INTRAMUSCULAR | Status: AC
  Filled 2019-05-10: qty 50

## 2019-05-10 NOTE — ED Triage Notes (Signed)
Pt reports shortness of breath that worsens when he lays flat. Not in respiratory distress in triage. He states that he has felt it for a couple of days, but tonight was the worst. He denies fever or cough. Denies sick contacts. Also reports chest discomfort (4/10). Last cancer treatment was in April.

## 2019-05-10 NOTE — ED Provider Notes (Signed)
Care transferred from previous provider, Mathews Argyle.  See note for full HPI.  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  Patient states symptoms began yesterday evening he was trying to lay down to sleep.  No lower extremity edema, erythema, ecchymosis or warmth.  No history of DVT or PE.  He is not currently anticoagulated.  No tobacco use.  He is not currently on any chemotherapy and follows with Dr. Irene Limbo for oncology. No current chemo.  Known COVID contacts.  Afebrile, nonseptic and appearing.  Vital signs stable.  No evidence of respiratory distress.  Labs reassuring per previous provider.  Troponin negative, EKG without ST/T changes.  Chest x-ray without acute CP pathology.  Plan for CTA of chest to rule out PE.  If negative can DC home with close outpatient follow-up with oncology or PCP. Physical Exam  BP (!) 144/74   Pulse (!) 52   Temp 98.4 F (36.9 C) (Oral)   Resp 16   SpO2 100%   Physical Exam Vitals signs and nursing note reviewed.  Constitutional:      General: He is not in acute distress.    Appearance: He is not ill-appearing, toxic-appearing or diaphoretic.  HENT:     Head: Normocephalic and atraumatic.     Jaw: There is normal jaw occlusion.     Nose:     Comments: Clear rhinorrhea and congestion to bilateral nares.  No sinus tenderness.    Mouth/Throat:     Comments: Posterior oropharynx clear.  Mucous membranes moist.  Tonsils without erythema or exudate.  Uvula midline without deviation.  No evidence of PTA or RPA.  No drooling, dysphasia or trismus.  Phonation normal. Neck:     Trachea: Trachea and phonation normal.     Meningeal: Brudzinski's sign and Kernig's sign absent.     Comments: No Neck stiffness or neck rigidity.  No meningismus.  No cervical lymphadenopathy. Cardiovascular:     Comments:  No murmurs rubs or gallops. Pulmonary:     Comments: Clear to auscultation bilaterally without wheeze, rhonchi or rales.  No accessory muscle usage.  Able speak in full sentences. Abdominal:     Comments: Soft, nontender without rebound or guarding.  No CVA tenderness.  Musculoskeletal:     Comments: Moves all 4 extremities without difficulty.  Lower extremities without edema, erythema or warmth.  Skin:    Comments: Brisk capillary refill.  No rashes or lesions.  Neurological:     Mental Status: He is alert.     Comments: Ambulatory in department without difficulty.  Cranial nerves II through XII grossly intact.  No facial droop.  No aphasia.     ED Course/Procedures     Procedures Labs Reviewed  CBC WITH DIFFERENTIAL/PLATELET - Abnormal; Notable for the following components:      Result Value   Hemoglobin 12.2 (*)    Platelets 146 (*)    All other components within normal limits  BASIC METABOLIC PANEL - Abnormal; Notable for the following components:   Glucose, Bld 158 (*)    All other components within normal limits  TROPONIN I (HIGH SENSITIVITY)  BRAIN NATRIURETIC PEPTIDE  Dg Chest 2 View  Result Date: 05/10/2019 CLINICAL DATA:  New onset shortness of breath. Neuroendocrine tumor. Metastatic disease to the liver and bone. EXAM: CHEST - 2 VIEW COMPARISON:  CT chest 03/15/2019 FINDINGS: The heart size is  normal. Right IJ Port-A-Cath is stable. Lung volumes are low. No focal airspace disease present. There is no edema or effusion. Visualized soft tissues and bony thorax are unremarkable. IMPRESSION: 1. Low lung volumes. 2. No acute cardiopulmonary disease. Electronically Signed   By: San Morelle M.D.   On: 05/10/2019 04:55   Ct Angio Chest Pe W And/or Wo Contrast  Result Date: 05/10/2019 CLINICAL DATA:  Shortness of breath for approximately 2 days. 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 EXAM: CT ANGIOGRAPHY CHEST WITH CONTRAST  TECHNIQUE: Multidetector CT imaging of the chest was performed using the standard protocol during bolus administration of intravenous contrast. Multiplanar CT image reconstructions and MIPs were obtained to evaluate the vascular anatomy. CONTRAST:  100 mL OMNIPAQUE IOHEXOL 350 MG/ML SOLN COMPARISON:  CT chest, abdomen and pelvis 03/15/2019. FINDINGS: Cardiovascular: Satisfactory opacification of the pulmonary arteries to the segmental level. No evidence of pulmonary embolism. Normal heart size. No pericardial effusion. Bovine type aortic arch incidentally noted. Mediastinum/Nodes: No enlarged mediastinal, hilar, or axillary lymph nodes. Thyroid gland, trachea, and esophagus demonstrate no significant findings. Lungs/Pleura: No pleural effusion. 0.3 cm right upper lobe nodule on image 50 is unchanged. Lungs are otherwise clear. Upper Abdomen: Cirrhotic liver is identified. Known metastatic deposits in the liver are not as well seen on today's examination likely due to bolus timing. No new abnormality is identified. Musculoskeletal: Scattered sclerotic lesions in the thoracic spine consistent with known metastatic disease are again seen. Review of the MIP images confirms the above findings. IMPRESSION: Negative for pulmonary embolus or acute disease. No change in a 0.3 cm right upper lobe pulmonary nodule. Cirrhotic liver with metastatic disease and scattered osseous metastases as seen on prior examination. Electronically Signed   By: Inge Rise M.D.   On: 05/10/2019 07:35    MDM  Care transferred from previous provider, Mathews Argyle.  See note for full HPI.  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  Patient states symptoms began yesterday evening he was trying to lay down to sleep.  No lower extremity edema, erythema, ecchymosis or warmth.   No history of DVT or PE.  He is not currently anticoagulated.  No tobacco use.  He is not currently on any chemotherapy and follows with Dr. Irene Limbo for oncology. No current chemo.  Known COVID contacts.  Afebrile, nonseptic and appearing.  Vital signs stable.  No evidence of respiratory distress.  Labs reassuring per previous provider.  Troponin negative, EKG without ST/T changes.  Chest x-ray without acute CP pathology.  Plan for CTA of chest to rule out PE.  If negative can DC home with close outpatient follow-up with oncology.  On evaluation patient speaking in full sentences without difficulty.  He denies any current chest pain or shortness of breath.  No upper respiratory symptoms.  He is afebrile without tachycardia, tachypnea or hypoxia.  I personally ambulated patient in room with oxygen saturation between 99 and 100%.  Patient denies any shortness of breath or dyspnea with ambulation.  CT scan negative for PE or pulmonary pathology.  He does have a small nodule which was seen on previous exam.  No evidence of metastatic disease to the lungs.  BNP is not elevated.  He does not have any evidence of fluid overload on exam.  Lower extremities without edema, erythema, ecchymosis or warmth.  Abdomen soft.  He is tolerating coffee while in the room without difficulty.  Will have patient follow-up with PCP/oncology for reevaluation.  Discussed strict return precautions.  Patient to return to emergency department for any new or worsening symptoms.  The patient has been appropriately medically screened and/or stabilized in the ED. I have low suspicion for any other emergent medical condition which would require further screening, evaluation or treatment in the ED or require inpatient management.  Patient is hemodynamically stable and in no acute distress.  Patient able to ambulate in department prior to ED.  Evaluation does not show acute pathology that would require ongoing or additional emergent  interventions while in the emergency department or further inpatient treatment.  I have discussed the diagnosis with the patient and answered all questions.  Patient has no further complaints prior to discharge.  Patient is comfortable with plan discussed in room and is stable for discharge at this time.  I have discussed strict return precautions for returning to the emergency department.  Patient was encouraged to follow-up with PCP/specialist refer to at discharge.         Mirren Gest A, PA-C 05/10/19 0160    Carmin Muskrat, MD 05/10/19 [phone]

## 2019-05-10 NOTE — Discharge Instructions (Signed)
Return to the emergency department for any new or worsening symptoms.

## 2019-05-10 NOTE — ED Provider Notes (Signed)
[facility] DEPT Provider Note   CSN: [id number] Arrival date & time: 05/10/19  0349     History   Chief Complaint Chief Complaint  Patient presents with  . Shortness of Breath    HPI [name] is a 58 y.o. male.     The history is provided by the patient and medical records.  Shortness of Breath    58 year old male with history of hypertension, arthritis, metastatic high-grade neuro endocrine carcinoma with liver mets s/p 6 cycles of carboplatin, presenting to the ED with SOB.  Patient states this began last evening when he was trying to lay down to sleep.  States he tried to elevate his legs but thinks this actually made it worse.  He states he has had some little twinges of chest pain but states "it is not severe".  He denies any lower extremity edema or calf pain.  He does not have any history of DVT or PE.  He is not currently on anticoagulation.  He has no known cardiac history.  He is not a smoker.  He is not currently on any type of chemotherapy, due for follow-up with his oncologist Dr. Irene Limbo later this month.  Past Medical History:  Diagnosis Date  . Arthritis    "knees; left hand"  . Cancer ([place])   . Gout   . Heart murmur    Longstanding. 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 Hypertension   . Near syncope    05/2011 with tachypalpitations & with echo showing mild LVH, normal EF 55-60%, trivial AR    Patient Active Problem List   Diagnosis Date Noted  . Cellulitis 12/11/2018  . Port-A-Cath in place 11/15/2018  . Bone metastases ([place]) 10/20/2018  . 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 Counseling regarding advance care planning and goals of care 10/20/2018  . Gout 03/21/2016  . Chest pain 02/18/2012  . Alcohol abuse 02/18/2012  . Syncope 05/28/2011  . Palpitations 05/28/2011  . Murmur 05/28/2011    Past Surgical History:  Procedure Laterality  Date  . IR IMAGING GUIDED PORT INSERTION  10/26/2018  . NO PAST SURGERIES          Home Medications    Prior to Admission medications   Medication Sig Start Date End Date Taking? Authorizing Provider  allopurinol (ZYLOPRIM) 100 MG tablet Take 2 tablets (200 mg total) by mouth daily. 01/26/19   Brunetta Genera, MD  clindamycin (CLEOCIN) 300 MG capsule Take 1 capsule (300 mg total) by mouth 4 (four) times daily. X 7 days 12/11/18   Veryl Speak, MD  colchicine 0.6 MG tablet Take 1 tablet (0.6 mg total) by mouth See admin instructions. Take 1.2mg  (2 tablets) followed by .6mg  (1 tablet) 1 hour later. Patient not taking: Reported on 12/11/2018 03/27/18   Molpus, Jenny Reichmann, MD  dexamethasone (DECADRON) 4 MG tablet 4mg  po daily with breakfast for 2 weeks then every other day for 2 weeks 12/24/18   Brunetta Genera, MD  furosemide (LASIX) 20 MG tablet Take 1 tablet (20 mg total) by mouth daily. 11/24/18   Brunetta Genera, MD  lidocaine-prilocaine (EMLA) cream Apply to affected area once Patient not taking: Reported on 12/11/2018 10/20/18   Brunetta Genera, MD  LORazepam (ATIVAN) 0.5 MG tablet Take 1 tablet (0.5 mg total) by mouth every 6 (six) hours as needed (Nausea or vomiting). 10/20/18   Brunetta Genera, MD  ondansetron (ZOFRAN ODT) 4 MG disintegrating tablet Take 1 tablet (4 mg total) by mouth every 8 (eight) hours as needed for nausea or vomiting. 10/01/18   Francine Graven, DO  oxyCODONE (OXY IR/ROXICODONE) 5 MG immediate release tablet Take 1-2 tablets (5-10 mg total) by mouth every 4 (four) hours as needed for severe pain. 04/18/19   Brunetta Genera, MD  potassium chloride SA (K-DUR,KLOR-CON) 20 MEQ tablet Take 1 tablet (20 mEq total) by mouth daily. With lasix 11/24/18   Brunetta Genera, MD  prochlorperazine (COMPAZINE) 10 MG tablet Take 1 tablet (10 mg total) by mouth every 6 (six) hours as needed (Nausea or vomiting). 10/20/18   Brunetta Genera, MD    Family  History Family History  Problem Relation Age of Onset  . Cancer Mother        Possibly pancreatic. Died at age 51  . Prostate cancer Father   . Breast cancer Cousin   . Diabetes Paternal Uncle   . Heart disease Paternal Uncle     Social History Social History   Tobacco Use  . Smoking status: Never Smoker  . Smokeless tobacco: Never Used  Substance Use Topics  . Alcohol use: Not Currently    Comment: states he has cut back/ every now and again  . Drug use: No     Allergies   Patient has no known allergies.   Review of Systems Review of Systems  Respiratory: Positive for shortness of breath.   All other systems reviewed and are negative.    Physical Exam Updated Vital Signs BP (!) 166/81 (BP Location: Left Arm)   Pulse 76   Temp 98.4 F (36.9 C) (Oral)   Resp 16   SpO2 100%   Physical Exam Vitals signs and nursing note reviewed.  Constitutional:      Appearance: He is well-developed.  HENT:     Head: Normocephalic and atraumatic.  Eyes:     Conjunctiva/sclera: Conjunctivae normal.     Pupils: Pupils are equal, round, and reactive to light.  Neck:     Musculoskeletal: Normal range of motion.  Cardiovascular:     Rate and Rhythm: Normal rate and regular rhythm.     Heart sounds: Normal heart sounds.  Pulmonary:     Effort: Pulmonary effort is normal.     Breath sounds: Normal breath sounds. No wheezing, rhonchi or rales.     Comments: Lungs overall clear, no distress, speaking in full sentences without difficulty, no wheezes or rhonchi Abdominal:     General: Bowel sounds are normal.     Palpations: Abdomen is soft.  Musculoskeletal: Normal range of motion.     Comments: No peripheral edema, calf asymmetry, tenderness, or palpable cords  Skin:    General: Skin is warm and dry.  Neurological:     Mental Status: He is alert and oriented to person, place, and time.      ED Treatments / Results  Labs (all labs ordered are listed, but only abnormal  results are displayed) Labs Reviewed  CBC WITH DIFFERENTIAL/PLATELET - Abnormal; Notable for the following components:      Result Value   Hemoglobin 12.2 (*)    Platelets 146 (*)    All other components within normal limits  BASIC METABOLIC PANEL - Abnormal; Notable for the following components:   Glucose, Bld 158 (*)    All other components within normal limits  TROPONIN I (HIGH SENSITIVITY)  BRAIN NATRIURETIC PEPTIDE    EKG EKG  Interpretation  Date/Time:  Tuesday May 10 2019 04:34:04 EDT Ventricular Rate:  66 PR Interval:    QRS Duration: 80 QT Interval:  380 QTC Calculation: 399 R Axis:   29 Text Interpretation:  Sinus rhythm Consider left atrial enlargement No significant change since last tracing Confirmed by Pryor Curia [phone]) on 05/10/2019 4:38:40 AM   Radiology Dg Chest 2 View  Result Date: 05/10/2019 CLINICAL DATA:  New onset shortness of breath. Neuroendocrine tumor. Metastatic disease to the liver and bone. EXAM: CHEST - 2 VIEW COMPARISON:  CT chest 03/15/2019 FINDINGS: The heart size is normal. Right IJ Port-A-Cath is stable. Lung volumes are low. No focal airspace disease present. There is no edema or effusion. Visualized soft tissues and bony thorax are unremarkable. IMPRESSION: 1. Low lung volumes. 2. No acute cardiopulmonary disease. Electronically Signed   By: San Morelle M.D.   On: 05/10/2019 04:55    Procedures Procedures (including critical care time)  Medications Ordered in ED Medications - No data to display   Initial Impression / Assessment and Plan / ED Course  I have reviewed the triage vital signs and the nursing notes.  Pertinent labs & imaging results that were available during my care of the patient were reviewed by me and considered in my medical decision making (see chart for details).  58 year old male here with shortness of breath.  States he started noticing this last evening when trying to lie flat to sleep.  She had elevate  his legs but this seemed to make it worse.  He has not had any cough, fever, sick contacts, or known COVID exposures.  He is afebrile and nontoxic in appearance here.  His vitals are stable on room air.  He is in no acute respiratory distress and his lungs are clear.  He does have history of neuroendocrine tumor with mets to liver and bone.  He denies any history of DVT or PE.  No lower extremity edema or calf pain.  Given his history, will obtain labs and CTA of the chest.  His chest x-ray is grossly normal.  Labs overall reassuring, troponin is negative.  CTA pending.  If negative, I feel he is stable for discharge with close OP follow-up.  As he has no fever or other infectious symptoms, no leukocytosis, and no reported COVID exposures, I have low suspicion for this and do not feel he needs COVID screening at this time.    Care will be signed out to morning team to follow-up on CTA and disposition.  Final Clinical Impressions(s) / ED Diagnoses   Final diagnoses:  SOB (shortness of breath)    ED Discharge Orders    None       Larene Pickett, PA-C 05/10/19 [place], Delice Bison, DO 05/10/19 [phone]

## 2019-05-12 ENCOUNTER — Other Ambulatory Visit: Payer: Self-pay | Admitting: Hematology

## 2019-05-16 ENCOUNTER — Other Ambulatory Visit: Payer: Self-pay | Admitting: *Deleted

## 2019-05-16 ENCOUNTER — Encounter: Payer: Self-pay | Admitting: Hematology

## 2019-05-16 NOTE — Telephone Encounter (Signed)
Patient sent MyChart message requesting refill of oxycodone. Refill in [place]

## 2019-05-17 ENCOUNTER — Other Ambulatory Visit: Payer: Self-pay | Admitting: Hematology

## 2019-05-17 ENCOUNTER — Encounter: Payer: Self-pay | Admitting: Hematology

## 2019-05-17 MED ORDER — OXYCODONE HCL 5 MG PO TABS: 5.0000 mg | ORAL_TABLET | ORAL | 0 refills | Status: DC | PRN

## 2019-05-22 NOTE — Progress Notes (Signed)
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  HISTORY OF PRESENTING ILLNESS:   [name] is a wonderful 58 y.o. male who has been referred to [place] by Dr. Francine Graven for evaluation and management of Liver masses. He is accompanied today by his wife and aunt. The pt reports that he is doing well overall.   The pt presented to the ED on 10/01/18 regarding persistent RUQ abdominal pain which began a month prior, and noted associations with nausea and 40 pound weight loss. He was evaluated with a CT A/P, as noted below, which revealed liver masses.   The pt reports that he first began feeling RUQ abdominal pain about 3-4 weeks ago, but began losing weight about 2 months ago. He notes that he began feeling more fatigue about 2 months ago as well.  The pt notes that the color of his bowels became dark black and denies taking Iron pills or bismuth medications. He notes that he began feeling constipated as well, feeling backed up. He notes that his stools began looking thinner in caliber. The pt notes that his appetite began decreasing over the last 3-4 weeks ago, and some of this he attributes to intermittent nausea. He denies problems swallowing food. He endorses RUQ pain, and denies vomiting and new cough, new bone pains, testicular pain or swelling, problems passing urine, blood in the urine. The pt has never had a colonoscopy and does not have a PCP, and cites his lack of health insurance as the reason.  The pt was released from the ED with Oxycodone and has been using this to treat his RUQ pain successfully.   The pt notes gout and a heart murmur as previous medical history. He notes that he hasn't consumed alcohol in the past 2 years whatsoever, but prior to this alcohol was a problem for  him.   Of note prior to the patient's visit today, pt has had a CT A/P completed on 10/01/18 with results revealing Interval development of numerous ill-defined hypodense masses throughout the entire liver compatible with metastatic disease. There is a 2.5 cm enhancing mass over the upper pole cortex of the right kidney suspicious for renal cell carcinoma. 2.  No acute findings in the abdomen/pelvis. 3. 1 cm hypodensity over the pancreatic tail not well seen on previous noncontrast exams. MR may be helpful for further evaluation on an elective basis.   Most recent lab results (10/06/18) of CBC w/diff and CMP is as follows: all values are WNL except for Monocytes abs at 1.2k, Sodium at 134, Albumin at 3.0, AST at 77, ALT at 63, Alk Phos at 392, Total Bilirubin at 2.3   On review of systems, pt reports new constipation, smaller caliber stools, dark stools, unexpected weight loss, RUQ pain, new fatigue, and denies vomiting, cough, new SOB, changes in breathing, new bone pains, leg swelling, blood in the urine, changes in urination, fevers, pain along the spine, and any other symptoms.   On PMHx the pt reports Gout, heart murmur, previous alcohol abuse. He denies any surgeries.  On Social Hx the pt reports that he hasn't consumed alcohol in the past 2 years whatsoever. He notes that alcohol abuse was previously a concern. He denies ever smoking cigarettes or doing drugs. He denies any radiation or chemical exposure.  On Family Hx  the pt reports mother with pancreatic cancer in 37s, maternal aunts with high blood pressure and heart failure, maternal aunt with breast cancer.   INTERVAL HISTORY:  [name] returns today for management, evaluation, after C6 treatment of his recently diagnosed High grade neuroendocrine carcinoma. The patient's last visit with [place] was on 03/23/2019. The pt reports that he is doing well overall. He is accompanied by his brother, [name], via telephone.   The pt reports that he  gets an upset stomach when he takes Marinol with Ensure. He has also been having some upset stomach for the last week, that comes and goes. He continues to have pain. He notes that he needs the amount of pain medication to manage his pain. He declines others utilizing his prescription.   Of note since the patient's last visit, pt has had a chest xray on 05/10/2019 for shortness of breath with results revealing low lung volumes and no acute cardiopulmonary disease.  He also had a chest CT angiography with contrast on 05/10/2019 with results revealing Negative for pulmonary embolus or acute disease. No change in a 0.3 cm right upper lobe pulmonary nodule. Cirrhotic liver with metastatic disease and scattered osseous metastases as seen on prior examination.  He notes that he had some difficulty catching his breath, but that he had no other symptoms. The symptoms resolved naturally over a short time and they have not returns.   Lab results today (05/23/19) of CBC w/diff and CMP is as follows:   On review of systems, pt denies any symptoms.   MEDICAL HISTORY:  Past Medical History:  Diagnosis Date  . Arthritis    "knees; left hand"  . Cancer ([place])   . Gout   . Heart murmur    Longstanding. 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 Hypertension   . Near syncope    05/2011 with tachypalpitations & with echo showing mild LVH, normal EF 55-60%, trivial AR    SURGICAL HISTORY: Past Surgical History:  Procedure Laterality Date  . IR IMAGING GUIDED PORT INSERTION  10/26/2018  . NO PAST SURGERIES      SOCIAL HISTORY: Social History   Socioeconomic History  . Marital status: Married    Spouse name: Not on file  . Number of children: 1  . Years of education: Not on file  . Highest education level: Not on file  Occupational History  . Occupation: Unemployed  Social Needs  . Financial resource strain: Not on file  . Food insecurity    Worry: Not on file     Inability: Not on file  . Transportation needs    Medical: Not on file    Non-medical: Not on file  Tobacco Use  . Smoking status: Never Smoker  . Smokeless tobacco: Never Used  Substance and Sexual Activity  . Alcohol use: Not Currently    Comment: states he has cut back/ every now and again  . Drug use: No  . Sexual activity: Yes    Partners: Female  Lifestyle  . Physical activity    Days per week: Not on file    Minutes per session: Not on file  . Stress: Not on file  Relationships  . Social Herbalist on phone: Not on file    Gets together: Not on file    Attends religious service: Not on file    Active member of club or organization: Not on file    Attends meetings  of clubs or organizations: Not on file    Relationship status: Not on file  . Intimate partner violence    Fear of current or ex partner: Not on file    Emotionally abused: Not on file    Physically abused: Not on file    Forced sexual activity: Not on file  Other Topics Concern  . Not on file  Social History Narrative   Married. 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 Is from [place].     FAMILY HISTORY: Family History  Problem Relation Age of Onset  . Cancer Mother        Possibly pancreatic. Died at age 55  . Prostate cancer Father   . Breast cancer Cousin   . Diabetes Paternal Uncle   . Heart disease Paternal Uncle     ALLERGIES:  has No Known Allergies.  MEDICATIONS:  Current Outpatient Medications  Medication Sig Dispense Refill  . allopurinol (ZYLOPRIM) 100 MG tablet Take 2 tablets (200 mg total) by mouth daily. (Patient not taking: Reported on 05/10/2019) 60 tablet 1  . clindamycin (CLEOCIN) 300 MG capsule Take 1 capsule (300 mg total) by mouth 4 (four) times daily. X 7 days (Patient not taking: Reported on 05/10/2019) 28 capsule 0  . colchicine 0.6 MG tablet Take 1 tablet (0.6 mg total) by mouth See admin instructions. Take 1.13m (2  tablets) followed by .678m(1 tablet) 1 hour later. (Patient not taking: Reported on 05/10/2019) 3 tablet 0  . dexamethasone (DECADRON) 4 MG tablet 67m73mo daily with breakfast for 2 weeks then every other day for 2 weeks (Patient not taking: Reported on 05/10/2019) 30 tablet 0  . furosemide (LASIX) 20 MG tablet Take 1 tablet (20 mg total) by mouth daily. (Patient not taking: Reported on 05/10/2019) 30 tablet 1  . lidocaine-prilocaine (EMLA) cream Apply to affected area once (Patient not taking: Reported on 05/10/2019) 30 g 3  . LORazepam (ATIVAN) 0.5 MG tablet Take 1 tablet (0.5 mg total) by mouth every 6 (six) hours as needed (Nausea or vomiting). 30 tablet 0  . ondansetron (ZOFRAN ODT) 4 MG disintegrating tablet Take 1 tablet (4 mg total) by mouth every 8 (eight) hours as needed for nausea or vomiting. (Patient not taking: Reported on 05/10/2019) 6 tablet 0  . oxyCODONE (OXY IR/ROXICODONE) 5 MG immediate release tablet Take 1-2 tablets (5-10 mg total) by mouth every 4 (four) hours as needed for severe pain. 90 tablet 0  . potassium chloride SA (K-DUR,KLOR-CON) 20 MEQ tablet Take 1 tablet (20 mEq total) by mouth daily. With lasix (Patient not taking: Reported on 05/10/2019) 30 tablet 1  . prochlorperazine (COMPAZINE) 10 MG tablet Take 1 tablet (10 mg total) by mouth every 6 (six) hours as needed (Nausea or vomiting). (Patient not taking: Reported on 05/10/2019) 30 tablet 1   No current facility-administered medications for this visit.    Facility-Administered Medications Ordered in Other Visits  Medication Dose Route Frequency Provider Last Rate Last Dose  . denosumab (XGEVA) injection 120 mg  120 mg Subcutaneous Once KalBrunetta GeneraD        REVIEW OF SYSTEMS:   A 10+ POINT REVIEW OF SYSTEMS WAS OBTAINED including neurology, dermatology, psychiatry, cardiac, respiratory, lymph, extremities, GI, GU, Musculoskeletal, constitutional, breasts, reproductive, HEENT.  All pertinent positives are noted in the  HPI.  All others are negative.     PHYSICAL EXAMINATION: ECOG PERFORMANCE STATUS: 1 - Symptomatic but completely ambulatory  Vitals:   05/23/19 1126  BP: (!) 156/70  Pulse: 66  Resp: 18  Temp: 98.7 F (37.1 C)  SpO2: 100%   Filed Weights   05/23/19 1126  Weight: 173 lb 14.4 oz (78.9 kg)   .Body mass index is 27.24 kg/m.   GENERAL:alert, in no acute distress and comfortable SKIN: no acute rashes, no significant lesions EYES: conjunctiva are pink and non-injected, sclera anicteric OROPHARYNX: MMM, no exudates, no oropharyngeal erythema or ulceration NECK: supple, no JVD LYMPH:  no palpable lymphadenopathy in the cervical, axillary or inguinal regions LUNGS: clear to auscultation b/l with normal respiratory effort HEART: regular rate & rhythm ABDOMEN:  normoactive bowel sounds , non tender, not distended. Extremity: no pedal edema PSYCH: alert & oriented x 3 with fluent speech NEURO: no focal motor/sensory deficits    LABORATORY DATA:  I have reviewed the data as listed  . CBC Latest Ref Rng & Units 05/23/2019 05/10/2019 03/23/2019  WBC 4.0 - 10.5 K/uL 4.3 4.6 5.3  Hemoglobin 13.0 - 17.0 g/dL 12.2(L) 12.2(L) 12.2(L)  Hematocrit 39.0 - 52.0 % 39.1 39.2 38.0(L)  Platelets 150 - 400 K/uL 125(L) 146(L) 113(L)   CMP Latest Ref Rng & Units 05/23/2019 05/10/2019 03/23/2019  Glucose 70 - 99 mg/dL 126(H) 158(H) 155(H)  BUN 6 - 20 mg/dL '10 14 11  ' Creatinine 0.61 - 1.24 mg/dL 1.01 0.87 0.93  Sodium 135 - 145 mmol/L 139 136 139  Potassium 3.5 - 5.1 mmol/L 4.3 3.8 4.0  Chloride 98 - 111 mmol/L 106 104 102  CO2 22 - 32 mmol/L '23 22 28  ' Calcium 8.9 - 10.3 mg/dL 9.3 9.7 9.7  Total Protein 6.5 - 8.1 g/dL 8.6(H) - 7.9  Total Bilirubin 0.3 - 1.2 mg/dL 0.6 - 0.4  Alkaline Phos 38 - 126 U/L 149(H) - 120  AST 15 - 41 U/L 23 - 16  ALT 0 - 44 U/L 20 - 18   Lab Results  Component Value Date   LDH 176 03/23/2019    03/31/2019 Mismatch Repair Protein Report   10/08/18 Liver Biopsy:     RADIOGRAPHIC STUDIES: I have personally reviewed the radiological images as listed and agreed with the findings in the report. Dg Chest 2 View  Result Date: 05/10/2019 CLINICAL DATA:  New onset shortness of breath. Neuroendocrine tumor. Metastatic disease to the liver and bone. EXAM: CHEST - 2 VIEW COMPARISON:  CT chest 03/15/2019 FINDINGS: The heart size is normal. Right IJ Port-A-Cath is stable. Lung volumes are low. No focal airspace disease present. There is no edema or effusion. Visualized soft tissues and bony thorax are unremarkable. IMPRESSION: 1. Low lung volumes. 2. No acute cardiopulmonary disease. Electronically Signed   By: San Morelle M.D.   On: 05/10/2019 04:55   Ct Angio Chest Pe W And/or Wo Contrast  Result Date: 05/10/2019 CLINICAL DATA:  Shortness of breath for approximately 2 days. 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 EXAM: CT ANGIOGRAPHY CHEST WITH CONTRAST TECHNIQUE: Multidetector CT imaging of the chest was performed using the standard protocol during bolus administration of intravenous contrast. Multiplanar CT image reconstructions and MIPs were obtained to evaluate the vascular anatomy. CONTRAST:  100 mL OMNIPAQUE IOHEXOL 350 MG/ML SOLN COMPARISON:  CT chest, abdomen and pelvis 03/15/2019. FINDINGS: Cardiovascular: Satisfactory opacification of the pulmonary arteries to the segmental level. No evidence of pulmonary embolism. Normal heart size. No pericardial effusion. Bovine type aortic arch incidentally noted. Mediastinum/Nodes: No enlarged mediastinal, hilar, or axillary lymph  nodes. Thyroid gland, trachea, and esophagus demonstrate no significant findings. Lungs/Pleura: No pleural effusion. 0.3 cm right upper lobe nodule on image 50 is unchanged. Lungs are otherwise clear. Upper Abdomen: Cirrhotic liver is identified. Known metastatic deposits in the liver are not as well seen on today's examination likely due to bolus  timing. No new abnormality is identified. Musculoskeletal: Scattered sclerotic lesions in the thoracic spine consistent with known metastatic disease are again seen. Review of the MIP images confirms the above findings. IMPRESSION: Negative for pulmonary embolus or acute disease. No change in a 0.3 cm right upper lobe pulmonary nodule. Cirrhotic liver with metastatic disease and scattered osseous metastases as seen on prior examination. Electronically Signed   By: Inge Rise M.D.   On: 05/10/2019 07:35    ASSESSMENT & PLAN:  58 y.o. male with  1. 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 There is a 2.5 cm enhancing mass over the upper pole cortex of the right kidney suspicious for renal cell carcinoma. 2.  No acute findings in the abdomen/pelvis. 3. 1 cm hypodensity over the pancreatic tail not well seen on previous noncontrast exams. MR may be helpful for further evaluation on an elective basis.    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 2. Upper pole right renal lesion is not definitely hypermetabolic and has been present since 2012. I think is unlikely the cause of the metastatic disease. Recommend liver biopsy for tissue diagnosis. If needed, an MRI of the abdomen without and with contrast may be helpful for further evaluation of the renal lesion. 3. Two sub 3 mm right upper lobe pulmonary nodules are indeterminate.   10/26/18 MRI Brain revealed No acute abnormality and negative for metastatic disease.  03/15/19 CT  C/A/P which revealed "Significant improvement in multifocal liver metastasis. 2. Interval development of multifocal areas of sclerosis within the visualized axial and proximal appendicular skeleton. These are favored to represent areas of healing bone metastases. 3. Stable lesion within distal tail of pancreas. 4. Aortic Atherosclerosis."   S/p 6 cycles of Carboplatin and 20% dose reduced Etoposide completed on 02/19/19. Dose reduced as pt developed significant thrombocytopenia after C1.  2. Abnormal LFTs - likely from liver metastases  Labs upon initial presentation from 10/06/18, some borderline monocytosis at 1.2k, otherwise normal blood counts, AST at 77, ALT at 63, Alk Phos at 392, Total Bilirubin at 2.3   3. Rt renal mass -  seen on 10/15/18 PET/CT but no longer observed on 03/15/19 CT C/A/P   PLAN:  -Discussed pt labwork today, 05/22/19; all values are WNL except for hemoglobin at 12.2, platelet at 125K, glucose at 126, total protein at 8.6, and alkaline phosphatase at 149. Magnesium from 05/23/2019 at 1.8 -no lab or clinical symptoms suggestive of disease progression at this time. Reticulocytes panel from 05/23/2019 values are normal -Discussed pain management and using oxycodone only as needed.for pain. -Discussed repeat scan in two months -Discussed need for increase in fluid intake -See back in 2 months with labs    FOLLOW UP: -continue Xgeva q4weeks for bone mets -CT chest/abd/pelvis , bone  scan and labs in 8 weeks -RTC with Dr Irene Limbo in 2 months   All of the patients questions were answered with apparent satisfaction. The patient knows to call the clinic with any problems, questions or concerns.  The total time spent in the appt was 25 minutes and more than 50% was on counseling and direct patient cares.    Sullivan Lone MD MS AAHIVMS [facility] Hematology/Oncology Physician [facility]  (Office):       [phone] (Work cell):  [phone] (Fax):            [phone]  05/23/2019 12:13 PM  I, Jacqualyn Posey, am acting as a Education administrator for Dr. Sullivan Lone.   .I have reviewed the above documentation for accuracy and completeness, and I agree with the above. Brunetta Genera MD

## 2019-05-23 ENCOUNTER — Other Ambulatory Visit: Payer: Self-pay

## 2019-05-23 ENCOUNTER — Other Ambulatory Visit: Payer: Self-pay | Admitting: *Deleted

## 2019-05-23 ENCOUNTER — Inpatient Hospital Stay (HOSPITAL_BASED_OUTPATIENT_CLINIC_OR_DEPARTMENT_OTHER): Payer: Medicaid Other | Admitting: Hematology

## 2019-05-23 ENCOUNTER — Inpatient Hospital Stay: Payer: Medicaid Other | Attending: Hematology

## 2019-05-23 VITALS — BP 156/70 | HR 66 | Temp 98.7°F | Resp 18 | Ht 67.0 in | Wt 173.9 lb

## 2019-05-23 DIAGNOSIS — C7A1 Malignant poorly differentiated neuroendocrine tumors: Secondary | ICD-10-CM | POA: Insufficient documentation

## 2019-05-23 DIAGNOSIS — M1990 Unspecified osteoarthritis, unspecified site: Secondary | ICD-10-CM | POA: Insufficient documentation

## 2019-05-23 DIAGNOSIS — G893 Neoplasm related pain (acute) (chronic): Secondary | ICD-10-CM

## 2019-05-23 DIAGNOSIS — K7460 Unspecified cirrhosis of liver: Secondary | ICD-10-CM | POA: Diagnosis not present

## 2019-05-23 DIAGNOSIS — R5383 Other fatigue: Secondary | ICD-10-CM | POA: Insufficient documentation

## 2019-05-23 DIAGNOSIS — I10 Essential (primary) hypertension: Secondary | ICD-10-CM

## 2019-05-23 DIAGNOSIS — Z79899 Other long term (current) drug therapy: Secondary | ICD-10-CM | POA: Insufficient documentation

## 2019-05-23 DIAGNOSIS — C787 Secondary malignant neoplasm of liver and intrahepatic bile duct: Secondary | ICD-10-CM | POA: Diagnosis not present

## 2019-05-23 DIAGNOSIS — R630 Anorexia: Secondary | ICD-10-CM | POA: Diagnosis not present

## 2019-05-23 DIAGNOSIS — Z800 Family history of malignant neoplasm of digestive organs: Secondary | ICD-10-CM | POA: Diagnosis not present

## 2019-05-23 DIAGNOSIS — C7951 Secondary malignant neoplasm of bone: Secondary | ICD-10-CM | POA: Diagnosis not present

## 2019-05-23 DIAGNOSIS — K921 Melena: Secondary | ICD-10-CM

## 2019-05-23 DIAGNOSIS — K5900 Constipation, unspecified: Secondary | ICD-10-CM | POA: Diagnosis not present

## 2019-05-23 DIAGNOSIS — Z803 Family history of malignant neoplasm of breast: Secondary | ICD-10-CM

## 2019-05-23 DIAGNOSIS — R1011 Right upper quadrant pain: Secondary | ICD-10-CM

## 2019-05-23 DIAGNOSIS — R110 Nausea: Secondary | ICD-10-CM | POA: Diagnosis not present

## 2019-05-23 DIAGNOSIS — C801 Malignant (primary) neoplasm, unspecified: Secondary | ICD-10-CM

## 2019-05-23 LAB — CMP (CANCER CENTER ONLY)
ALT: 20 U/L (ref 0–44)
AST: 23 U/L (ref 15–41)
Albumin: 3.6 g/dL (ref 3.5–5.0)
Alkaline Phosphatase: 149 U/L — ABNORMAL HIGH (ref 38–126)
Anion gap: 10 (ref 5–15)
BUN: 10 mg/dL (ref 6–20)
CO2: 23 mmol/L (ref 22–32)
Calcium: 9.3 mg/dL (ref 8.9–10.3)
Chloride: 106 mmol/L (ref 98–111)
Creatinine: 1.01 mg/dL (ref 0.61–1.24)
GFR, Est AFR Am: >60 mL/min (ref >60)
GFR, Estimated: >60 mL/min (ref >60)
Glucose, Bld: 126 mg/dL — ABNORMAL HIGH (ref 70–99)
Potassium: 4.3 mmol/L (ref 3.5–5.1)
Sodium: 139 mmol/L (ref 135–145)
Total Bilirubin: 0.6 mg/dL (ref 0.3–1.2)
Total Protein: 8.6 g/dL — ABNORMAL HIGH (ref 6.5–8.1)

## 2019-05-23 LAB — CBC WITH DIFFERENTIAL (CANCER CENTER ONLY)
Abs Immature Granulocytes: 0.01 10*3/uL (ref 0.00–0.07)
Basophils Absolute: 0 10*3/uL (ref 0.0–0.1)
Basophils Relative: 1 %
Eosinophils Absolute: 0.2 10*3/uL (ref 0.0–0.5)
Eosinophils Relative: 5 %
HCT: 39.1 % (ref 39.0–52.0)
Hemoglobin: 12.2 g/dL — ABNORMAL LOW (ref 13.0–17.0)
Immature Granulocytes: 0 %
Lymphocytes Relative: 27 %
Lymphs Abs: 1.2 10*3/uL (ref 0.7–4.0)
MCH: 26.2 pg (ref 26.0–34.0)
MCHC: 31.2 g/dL (ref 30.0–36.0)
MCV: 83.9 fL (ref 80.0–100.0)
Monocytes Absolute: 0.5 10*3/uL (ref 0.1–1.0)
Monocytes Relative: 11 %
Neutro Abs: 2.4 10*3/uL (ref 1.7–7.7)
Neutrophils Relative %: 56 %
Platelet Count: 125 10*3/uL — ABNORMAL LOW (ref 150–400)
RBC: 4.66 MIL/uL (ref 4.22–5.81)
RDW: 14.4 % (ref 11.5–15.5)
WBC Count: 4.3 10*3/uL (ref 4.0–10.5)
nRBC: 0 % (ref 0.0–0.2)

## 2019-05-23 LAB — RETICULOCYTES
Immature Retic Fract: 11.2 % (ref 2.3–15.9)
RBC.: 4.62 MIL/uL (ref 4.22–5.81)
Retic Count, Absolute: 55.9 10*3/uL (ref 19.0–186.0)
Retic Ct Pct: 1.2 % (ref 0.4–3.1)

## 2019-05-23 LAB — LACTATE DEHYDROGENASE: LDH: 163 U/L (ref 98–192)

## 2019-05-23 LAB — MAGNESIUM: Magnesium: 1.8 mg/dL (ref 1.7–2.4)

## 2019-05-24 ENCOUNTER — Telehealth: Payer: Self-pay | Admitting: Hematology

## 2019-05-24 NOTE — Telephone Encounter (Signed)
Scheduled appt per 7/20 los.  Patient did not answer the phone and voice mail box was not set up.  Printed and mailed appt calendar.

## 2019-05-30 ENCOUNTER — Other Ambulatory Visit: Payer: Self-pay

## 2019-05-30 ENCOUNTER — Inpatient Hospital Stay: Payer: Medicaid Other

## 2019-05-30 VITALS — BP 148/72 | HR 62 | Temp 98.2°F | Resp 18

## 2019-05-30 DIAGNOSIS — C7A1 Malignant poorly differentiated neuroendocrine tumors: Secondary | ICD-10-CM | POA: Diagnosis not present

## 2019-05-30 DIAGNOSIS — C801 Malignant (primary) neoplasm, unspecified: Secondary | ICD-10-CM

## 2019-05-30 DIAGNOSIS — C7951 Secondary malignant neoplasm of bone: Secondary | ICD-10-CM

## 2019-05-30 DIAGNOSIS — Z95828 Presence of other vascular implants and grafts: Secondary | ICD-10-CM

## 2019-05-30 DIAGNOSIS — Z7189 Other specified counseling: Secondary | ICD-10-CM

## 2019-05-30 DIAGNOSIS — C787 Secondary malignant neoplasm of liver and intrahepatic bile duct: Secondary | ICD-10-CM

## 2019-05-30 MED ORDER — DENOSUMAB 120 MG/1.7ML ~~LOC~~ SOLN
120.0000 mg | Freq: Once | SUBCUTANEOUS | Status: AC
  Administered 2019-05-30: 120 mg via SUBCUTANEOUS

## 2019-05-30 MED ORDER — DENOSUMAB 120 MG/1.7ML ~~LOC~~ SOLN
SUBCUTANEOUS | Status: AC
  Filled 2019-05-30: qty 1.7

## 2019-05-30 NOTE — Patient Instructions (Signed)

## 2019-06-06 ENCOUNTER — Encounter: Payer: Self-pay | Admitting: Hematology

## 2019-06-06 ENCOUNTER — Other Ambulatory Visit: Payer: Self-pay | Admitting: *Deleted

## 2019-06-06 DIAGNOSIS — C787 Secondary malignant neoplasm of liver and intrahepatic bile duct: Secondary | ICD-10-CM

## 2019-06-06 DIAGNOSIS — Z7189 Other specified counseling: Secondary | ICD-10-CM

## 2019-06-06 MED ORDER — LORAZEPAM 0.5 MG PO TABS: 0.5000 mg | ORAL_TABLET | Freq: Four times a day (QID) | ORAL | 0 refills | Status: DC | PRN

## 2019-06-06 NOTE — Telephone Encounter (Signed)
Patient sent My chart message requesting refill of lorazepam

## 2019-06-10 ENCOUNTER — Other Ambulatory Visit: Payer: Self-pay | Admitting: Hematology

## 2019-06-11 ENCOUNTER — Other Ambulatory Visit: Payer: Self-pay | Admitting: Hematology

## 2019-06-13 ENCOUNTER — Encounter: Payer: Self-pay | Admitting: Hematology

## 2019-06-14 ENCOUNTER — Other Ambulatory Visit: Payer: Self-pay | Admitting: Hematology

## 2019-06-14 ENCOUNTER — Encounter: Payer: Self-pay | Admitting: Hematology

## 2019-06-14 MED ORDER — OXYCODONE HCL 5 MG PO TABS: 5.0000 mg | ORAL_TABLET | ORAL | 0 refills | Status: DC | PRN

## 2019-06-19 ENCOUNTER — Emergency Department (HOSPITAL_COMMUNITY): Payer: Medicaid Other

## 2019-06-19 ENCOUNTER — Other Ambulatory Visit: Payer: Self-pay

## 2019-06-19 ENCOUNTER — Encounter (HOSPITAL_COMMUNITY): Payer: Self-pay

## 2019-06-19 ENCOUNTER — Emergency Department (HOSPITAL_COMMUNITY)
Admission: EM | Admit: 2019-06-19 | Discharge: 2019-06-19 | Disposition: A | Discharge status: Home / Self Care | Payer: Medicaid Other | Attending: Emergency Medicine | Admitting: Emergency Medicine

## 2019-06-19 DIAGNOSIS — I10 Essential (primary) hypertension: Secondary | ICD-10-CM | POA: Insufficient documentation

## 2019-06-19 DIAGNOSIS — Z95828 Presence of other vascular implants and grafts: Secondary | ICD-10-CM | POA: Insufficient documentation

## 2019-06-19 DIAGNOSIS — C787 Secondary malignant neoplasm of liver and intrahepatic bile duct: Secondary | ICD-10-CM | POA: Insufficient documentation

## 2019-06-19 DIAGNOSIS — R197 Diarrhea, unspecified: Secondary | ICD-10-CM

## 2019-06-19 DIAGNOSIS — C799 Secondary malignant neoplasm of unspecified site: Secondary | ICD-10-CM

## 2019-06-19 DIAGNOSIS — R1031 Right lower quadrant pain: Secondary | ICD-10-CM | POA: Diagnosis present

## 2019-06-19 LAB — COMPREHENSIVE METABOLIC PANEL
ALT: 32 U/L (ref 0–44)
AST: 35 U/L (ref 15–41)
Albumin: 4 g/dL (ref 3.5–5.0)
Alkaline Phosphatase: 150 U/L — ABNORMAL HIGH (ref 38–126)
Anion gap: 14 (ref 5–15)
BUN: 17 mg/dL (ref 6–20)
CO2: 22 mmol/L (ref 22–32)
Calcium: 10.3 mg/dL (ref 8.9–10.3)
Chloride: 100 mmol/L (ref 98–111)
Creatinine, Ser: 1.09 mg/dL (ref 0.61–1.24)
GFR calc Af Amer: >60 mL/min (ref >60)
GFR calc non Af Amer: >60 mL/min (ref >60)
Glucose, Bld: 179 mg/dL — ABNORMAL HIGH (ref 70–99)
Potassium: 3.4 mmol/L — ABNORMAL LOW (ref 3.5–5.1)
Sodium: 136 mmol/L (ref 135–145)
Total Bilirubin: 1 mg/dL (ref 0.3–1.2)
Total Protein: 9.3 g/dL — ABNORMAL HIGH (ref 6.5–8.1)

## 2019-06-19 LAB — CBC WITH DIFFERENTIAL/PLATELET
Abs Immature Granulocytes: 0.05 K/uL (ref 0.00–0.07)
Basophils Absolute: 0.1 K/uL (ref 0.0–0.1)
Basophils Relative: 0 %
Eosinophils Absolute: 0.1 K/uL (ref 0.0–0.5)
Eosinophils Relative: 1 %
HCT: 43.1 % (ref 39.0–52.0)
Hemoglobin: 13.2 g/dL (ref 13.0–17.0)
Immature Granulocytes: 0 %
Lymphocytes Relative: 11 %
Lymphs Abs: 1.2 K/uL (ref 0.7–4.0)
MCH: 26.7 pg (ref 26.0–34.0)
MCHC: 30.6 g/dL (ref 30.0–36.0)
MCV: 87.1 fL (ref 80.0–100.0)
Monocytes Absolute: 0.7 K/uL (ref 0.1–1.0)
Monocytes Relative: 6 %
Neutro Abs: 9.1 K/uL — ABNORMAL HIGH (ref 1.7–7.7)
Neutrophils Relative %: 82 %
Platelets: 135 K/uL — ABNORMAL LOW (ref 150–400)
RBC: 4.95 MIL/uL (ref 4.22–5.81)
RDW: 14.5 % (ref 11.5–15.5)
WBC: 11.1 K/uL — ABNORMAL HIGH (ref 4.0–10.5)
nRBC: 0 % (ref 0.0–0.2)

## 2019-06-19 LAB — URINALYSIS, ROUTINE W REFLEX MICROSCOPIC
Bilirubin Urine: NEGATIVE
Glucose, UA: NEGATIVE mg/dL
Hgb urine dipstick: NEGATIVE
Ketones, ur: NEGATIVE mg/dL
Leukocytes,Ua: NEGATIVE
Nitrite: NEGATIVE
Protein, ur: NEGATIVE mg/dL
Specific Gravity, Urine: 1.038 — ABNORMAL HIGH (ref 1.005–1.030)
pH: 5 (ref 5.0–8.0)

## 2019-06-19 LAB — LIPASE, BLOOD: Lipase: 21 U/L (ref 11–51)

## 2019-06-19 MED ORDER — MORPHINE SULFATE (PF) 4 MG/ML IV SOLN
4.0000 mg | Freq: Once | INTRAVENOUS | Status: AC
  Administered 2019-06-19: 10:00:00 4 mg via INTRAVENOUS
  Filled 2019-06-19: qty 1

## 2019-06-19 MED ORDER — SODIUM CHLORIDE 0.9 % IV BOLUS
500.0000 mL | Freq: Once | INTRAVENOUS | Status: AC
  Administered 2019-06-19: 10:00:00 500 mL via INTRAVENOUS

## 2019-06-19 MED ORDER — SODIUM CHLORIDE 0.9 % IV SOLN
INTRAVENOUS | Status: DC
  Administered 2019-06-19: 10:00:00 via INTRAVENOUS

## 2019-06-19 MED ORDER — ONDANSETRON HCL 4 MG/2ML IJ SOLN
4.0000 mg | Freq: Once | INTRAMUSCULAR | Status: AC
  Administered 2019-06-19: 4 mg via INTRAVENOUS
  Filled 2019-06-19: qty 2

## 2019-06-19 MED ORDER — IOHEXOL 300 MG/ML  SOLN
100.0000 mL | Freq: Once | INTRAMUSCULAR | Status: AC | PRN
  Administered 2019-06-19: 100 mL via INTRAVENOUS

## 2019-06-19 MED ORDER — SODIUM CHLORIDE (PF) 0.9 % IJ SOLN
INTRAMUSCULAR | Status: AC
  Filled 2019-06-19: qty 50

## 2019-06-19 NOTE — ED Provider Notes (Signed)
[facility] DEPT Provider Note   CSN: [id number] Arrival date & time: 06/19/19  2706    History   Chief Complaint Chief Complaint  Patient presents with   Abdominal Pain   Diarrhea    HPI [name] is a 58 y.o. male.     HPI Pt woke up this am with abd pain.  The pain has been gradually building up since then.  He is having a sharp pain in his lower abdomen.  No radiation.  He has had two bowel movements this am, normal.   Pt did vomit once earlier this am.  No trouble urinating.  No hx of abdominal pain problems.   No prior abdominal surgery. Past Medical History:  Diagnosis Date   Arthritis    "knees; left hand"   Cancer ([place])    Gout    Heart murmur    Longstanding. 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 Authorizing Provider  LORazepam (ATIVAN) 0.5 MG tablet Take 1 tablet (0.5 mg total) by mouth every 6 (six) hours as needed (Nausea or vomiting). 06/06/19  Yes Brunetta Genera, MD  oxyCODONE (OXY IR/ROXICODONE) 5 MG immediate release tablet Take 1-2 tablets (5-10 mg total) by mouth every 4  (four) hours as needed for severe pain. 06/14/19  Yes Brunetta Genera, MD  allopurinol (ZYLOPRIM) 100 MG tablet Take 2 tablets (200 mg total) by mouth daily. Patient not taking: Reported on 05/10/2019 01/26/19   Brunetta Genera, MD  clindamycin (CLEOCIN) 300 MG capsule Take 1 capsule (300 mg total) by mouth 4 (four) times daily. X 7 days Patient not taking: Reported on 05/10/2019 12/11/18   Veryl Speak, MD  colchicine 0.6 MG tablet Take 1 tablet (0.6 mg total) by mouth See admin instructions. Take 1.2mg  (2 tablets) followed by .6mg  (1 tablet) 1 hour later. Patient not taking: Reported on 05/10/2019 03/27/18   Molpus, Jenny Reichmann, MD  dexamethasone (DECADRON) 4 MG tablet 4mg  po daily with breakfast for 2 weeks then every other day for 2 weeks Patient not taking: Reported on 05/10/2019 12/24/18   Brunetta Genera, MD  furosemide (LASIX) 20 MG tablet Take 1 tablet (20 mg total) by mouth daily. Patient not taking: Reported on 05/10/2019 11/24/18   Brunetta Genera, MD  lidocaine-prilocaine (EMLA) cream Apply to affected area once Patient not taking: Reported on 05/10/2019 10/20/18   Brunetta Genera, MD  ondansetron (ZOFRAN ODT) 4 MG disintegrating tablet Take 1 tablet (4 mg total) by mouth every 8 (eight) hours as needed for nausea or vomiting. Patient not  taking: Reported on 05/10/2019 10/01/18   Francine Graven, DO  potassium chloride SA (K-DUR,KLOR-CON) 20 MEQ tablet Take 1 tablet (20 mEq total) by mouth daily. With lasix Patient not taking: Reported on 05/10/2019 11/24/18   Brunetta Genera, MD  prochlorperazine (COMPAZINE) 10 MG tablet Take 1 tablet (10 mg total) by mouth every 6 (six) hours as needed (Nausea or vomiting). Patient not taking: Reported on 05/10/2019 10/20/18   Brunetta Genera, MD    Family History Family History  Problem Relation Age of Onset   Cancer Mother        Possibly pancreatic. Died at age 79   Prostate cancer Father    Breast cancer Cousin    Diabetes  Paternal Uncle    Heart disease Paternal Uncle     Social History Social History   Tobacco Use   Smoking status: Never Smoker   Smokeless tobacco: Never Used  Substance Use Topics   Alcohol use: Not Currently    Comment: states he has cut back/ every now and again   Drug use: No     Allergies   Patient has no known allergies.   Review of Systems Review of Systems  Constitutional: Negative for fever.  Respiratory: Negative for cough and shortness of breath.   Genitourinary: Negative for dysuria.  All other systems reviewed and are negative.    Physical Exam Updated Vital Signs BP (!) 178/85    Pulse 90    Temp (!) 97.3 F (36.3 C) (Oral)    Resp 18    SpO2 99%   Physical Exam Vitals signs and nursing note reviewed.  Constitutional:      General: He is not in acute distress.    Appearance: He is well-developed.  HENT:     Head: Normocephalic and atraumatic.     Right Ear: External ear normal.     Left Ear: External ear normal.  Eyes:     General: No scleral icterus.       Right eye: No discharge.        Left eye: No discharge.     Conjunctiva/sclera: Conjunctivae normal.  Neck:     Musculoskeletal: Neck supple.     Trachea: No tracheal deviation.  Cardiovascular:     Rate and Rhythm: Normal rate and regular rhythm.  Pulmonary:     Effort: Pulmonary effort is normal. No respiratory distress.     Breath sounds: Normal breath sounds. No stridor. No wheezing or rales.  Abdominal:     General: Bowel sounds are normal. There is no distension.     Palpations: Abdomen is soft.     Tenderness: There is abdominal tenderness in the right lower quadrant. There is no guarding or rebound.     Hernia: No hernia is present.  Musculoskeletal:        General: No tenderness.  Skin:    General: Skin is warm and dry.     Findings: No rash.  Neurological:     Mental Status: He is alert.     Cranial Nerves: No cranial nerve deficit (no facial droop, extraocular  movements intact, no slurred speech).     Sensory: No sensory deficit.     Motor: No abnormal muscle tone or seizure activity.     Coordination: Coordination normal.      ED Treatments / Results  Labs (all labs ordered are listed, but only abnormal results are displayed) Labs Reviewed  COMPREHENSIVE METABOLIC PANEL - Abnormal; Notable for the following  components:      Result Value   Potassium 3.4 (*)    Glucose, Bld 179 (*)    Total Protein 9.3 (*)    Alkaline Phosphatase 150 (*)    All other components within normal limits  CBC WITH DIFFERENTIAL/PLATELET - Abnormal; Notable for the following components:   WBC 11.1 (*)    Platelets 135 (*)    Neutro Abs 9.1 (*)    All other components within normal limits  URINALYSIS, ROUTINE W REFLEX MICROSCOPIC - Abnormal; Notable for the following components:   Specific Gravity, Urine 1.038 (*)    All other components within normal limits  LIPASE, BLOOD    EKG None  Radiology Ct Abdomen Pelvis W Contrast  Result Date: 06/19/2019 CLINICAL DATA:  Metastatic high-grade neuroendocrine small cell carcinoma. Acute abdominal pain with diarrhea EXAM: CT ABDOMEN AND PELVIS WITH CONTRAST TECHNIQUE: Multidetector CT imaging of the abdomen and pelvis was performed using the standard protocol following bolus administration of intravenous contrast. CONTRAST:  146mL OMNIPAQUE IOHEXOL 300 MG/ML  SOLN COMPARISON:  CT 03/15/2019 chest and pelvis, CT thorax 05/10/2019 FINDINGS: Lower chest: Lung bases are clear. Hepatobiliary: Again demonstrated multifocal hepatic metastasis. There is some differing technique however overall impression is that lesions are increased in size. For example 5.6 cm lesion the RIGHT hepatic lobe (image 32/2) compares to 4.3. Lesion just superior to the falciform ligament measures 3.1 cm (image 26/2) compared to 3.4 cm. Subcapsular lesion in the anterior LEFT hepatic lobe measures 3.3 cm compared to 3.1. Overall size of the liver is  increased measuring 27.8 x 14.1 cm in axial dimension (image 27/2 compared to 25.6 x 13.7 cm. Liver measures 20 cm in craniocaudad dimension compared to 15 cm. Portal veins are patent.  No ascites. Pancreas: Pancreas is normal. No ductal dilatation. No pancreatic inflammation. Spleen: Adrenal glands and kidneys are normal. Small exophytic lesion upper pole of the RIGHT kidney measuring 2.1 cm 1.6 cm unchanged. Adrenals/urinary tract: Adrenal glands and kidneys are normal. The ureters and bladder normal. Stomach/Bowel: Stomach, small bowel, appendix, and cecum are normal. The colon and rectosigmoid colon are normal. Vascular/Lymphatic: Abdominal aorta is normal caliber. No periportal or retroperitoneal adenopathy. No pelvic adenopathy. Reproductive: Prostate normal Other: No free fluid. Musculoskeletal: Multiple round sclerotic metastasis in the pelvis and spine unchanged. IMPRESSION: 1. Widespread hepatic metastasis. Individual lesions are difficult to measure however the overall size of the liver is increased significantly and therefore findings are consistent with progression of hepatic metastasis compared to CT of 03/15/2019). 2. No bowel obstruction. 3. No abdominopelvic lymphadenopathy. 4. Stable enhancing small RIGHT renal mass. 5. Stable sclerotic skeletal metastasis. Electronically Signed   By: Suzy Bouchard M.D.   On: 06/19/2019 12:28    Procedures Procedures (including critical care time)  Medications Ordered in ED Medications  sodium chloride 0.9 % bolus 500 mL (0 mLs Intravenous Stopped 06/19/19 1154)    And  0.9 %  sodium chloride infusion ( Intravenous New Bag/Given 06/19/19 1006)  sodium chloride (PF) 0.9 % injection (has no administration in time range)  ondansetron (ZOFRAN) injection 4 mg (4 mg Intravenous Given 06/19/19 0957)  morphine 4 MG/ML injection 4 mg (4 mg Intravenous Given 06/19/19 0957)  iohexol (OMNIPAQUE) 300 MG/ML solution 100 mL (100 mLs Intravenous Contrast Given 06/19/19  1134)     Initial Impression / Assessment and Plan / ED Course  I have reviewed the triage vital signs and the nursing notes.  Pertinent labs & imaging results that were  available during my care of the patient were reviewed by me and considered in my medical decision making (see chart for details).  Clinical Course as of Jun 18 1406  Sun Jun 19, 2019  1405 Pt is feeling better now.  DIscussed results   [JK]    Clinical Course User Index [JK] Dorie Rank, MD    Labs reassuring.  Chronic thrombocytopenia is stable.  CT scan shows possible increasing hepatic lesions in setting of known malignancy.  Could be source of his pain although may be unrelated.  Pt did have bouts of frequent stool while here in the ED.  ?viral gi illness.  Pt is feeling better.  Stable for discharge.  Discussed following up with his oncologist regarding the CT finding.    Final Clinical Impressions(s) / ED Diagnoses   Final diagnoses:  Metastatic cancer ([place])  Diarrhea, unspecified type    ED Discharge Orders    None       Dorie Rank, MD 06/19/19 1407

## 2019-06-19 NOTE — ED Notes (Signed)
He smilingly tells me he feels "much better". His wife is with him. They are aware we are awaiting test results.

## 2019-06-19 NOTE — ED Notes (Signed)
Pt. Aware of urine specimen. Will collect urine when pt. Voids. Nurse aware.  

## 2019-06-19 NOTE — ED Triage Notes (Signed)
He tells me that he is here for low abd. Pain with several episodes of watery diarrhea x 3-4 hours. He also states he has not had any chemo. Since April of this year. He is ambulatory, awake, alert and in no distress.

## 2019-06-19 NOTE — ED Notes (Addendum)
Please call or have patient call Wife when patient can have a visitor [phone].Marland Kitchen  UPDATE: phone died, wife out side of ER on the bench waiting.

## 2019-06-19 NOTE — ED Notes (Signed)
Delay in triage- pt has been in and out of the bathroom 2x since arrival.

## 2019-06-19 NOTE — Discharge Instructions (Signed)
Continue your current medications, follow up with your cancer doctor as we discussed regarding the CT scan findings, return as needed for worsening symptoms

## 2019-06-20 ENCOUNTER — Other Ambulatory Visit: Payer: Self-pay

## 2019-06-20 ENCOUNTER — Encounter (HOSPITAL_COMMUNITY): Payer: Self-pay

## 2019-06-20 ENCOUNTER — Emergency Department (HOSPITAL_COMMUNITY)
Admission: EM | Admit: 2019-06-20 | Discharge: 2019-06-20 | Disposition: A | Discharge status: Home / Self Care | Payer: Medicaid Other | Attending: Emergency Medicine | Admitting: Emergency Medicine

## 2019-06-20 ENCOUNTER — Encounter: Payer: Self-pay | Admitting: Hematology

## 2019-06-20 DIAGNOSIS — Z79899 Other long term (current) drug therapy: Secondary | ICD-10-CM | POA: Insufficient documentation

## 2019-06-20 DIAGNOSIS — I10 Essential (primary) hypertension: Secondary | ICD-10-CM | POA: Insufficient documentation

## 2019-06-20 DIAGNOSIS — R1030 Lower abdominal pain, unspecified: Secondary | ICD-10-CM | POA: Diagnosis not present

## 2019-06-20 LAB — URINALYSIS, ROUTINE W REFLEX MICROSCOPIC
Bilirubin Urine: NEGATIVE
Glucose, UA: NEGATIVE mg/dL
Hgb urine dipstick: NEGATIVE
Ketones, ur: NEGATIVE mg/dL
Leukocytes,Ua: NEGATIVE
Nitrite: NEGATIVE
Protein, ur: NEGATIVE mg/dL
Specific Gravity, Urine: 1.014 (ref 1.005–1.030)
pH: 5 (ref 5.0–8.0)

## 2019-06-20 LAB — CBC
HCT: 40.2 % (ref 39.0–52.0)
Hemoglobin: 12.5 g/dL — ABNORMAL LOW (ref 13.0–17.0)
MCH: 26.7 pg (ref 26.0–34.0)
MCHC: 31.1 g/dL (ref 30.0–36.0)
MCV: 85.9 fL (ref 80.0–100.0)
Platelets: 121 10*3/uL — ABNORMAL LOW (ref 150–400)
RBC: 4.68 MIL/uL (ref 4.22–5.81)
RDW: 14.9 % (ref 11.5–15.5)
WBC: 16 10*3/uL — ABNORMAL HIGH (ref 4.0–10.5)
nRBC: 0 % (ref 0.0–0.2)

## 2019-06-20 LAB — COMPREHENSIVE METABOLIC PANEL
ALT: 28 U/L (ref 0–44)
AST: 31 U/L (ref 15–41)
Albumin: 3.8 g/dL (ref 3.5–5.0)
Alkaline Phosphatase: 116 U/L (ref 38–126)
Anion gap: 7 (ref 5–15)
BUN: 24 mg/dL — ABNORMAL HIGH (ref 6–20)
CO2: 25 mmol/L (ref 22–32)
Calcium: 9.3 mg/dL (ref 8.9–10.3)
Chloride: 101 mmol/L (ref 98–111)
Creatinine, Ser: 1.18 mg/dL (ref 0.61–1.24)
GFR calc Af Amer: >60 mL/min (ref >60)
GFR calc non Af Amer: >60 mL/min (ref >60)
Glucose, Bld: 161 mg/dL — ABNORMAL HIGH (ref 70–99)
Potassium: 4.4 mmol/L (ref 3.5–5.1)
Sodium: 133 mmol/L — ABNORMAL LOW (ref 135–145)
Total Bilirubin: 1.3 mg/dL — ABNORMAL HIGH (ref 0.3–1.2)
Total Protein: 8.9 g/dL — ABNORMAL HIGH (ref 6.5–8.1)

## 2019-06-20 LAB — LIPASE, BLOOD: Lipase: 18 U/L (ref 11–51)

## 2019-06-20 MED ORDER — DICYCLOMINE HCL 10 MG/5ML PO SOLN
10.0000 mg | Freq: Once | ORAL | Status: AC
  Administered 2019-06-20: 20:00:00 10 mg via ORAL
  Filled 2019-06-20: qty 5

## 2019-06-20 MED ORDER — SODIUM CHLORIDE 0.9% FLUSH: 3.0000 mL | Freq: Once | INTRAVENOUS | Status: DC

## 2019-06-20 NOTE — ED Triage Notes (Signed)
Pt seen yesterday for same. Pt c/o lower abd pain and diarrhea. Pt states it is not as bad as it was yesterday yet, he wanted to make sure it didn't get as bad.

## 2019-06-20 NOTE — ED Provider Notes (Signed)
[facility] DEPT Provider Note   CSN: [id number] Arrival date & time: 06/20/19  1709     History   Chief Complaint Chief Complaint  Patient presents with  . Abdominal Pain    HPI [name] is a 58 y.o. male.     58 year old male presents with lower abdominal pain that began 20 minutes after eating.  Seen here yesterday for having abdominal cramping along with diarrhea and that work-up was reviewed.  States his diarrhea is actually improved but that today after eating soup he developed severe lower abdominal discomfort.  No fever or vomiting.  He denies any urinary symptoms.  States that symptoms have gradually improved on its own.  No treatment use prior to arrival.  Has been gradually feeling better.     Past Medical History:  Diagnosis Date  . Arthritis    "knees; left hand"  . Cancer ([place])   . Gout   . Heart murmur    Longstanding. 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 Hypertension   . Near syncope    05/2011 with tachypalpitations & with echo showing mild LVH, normal EF 55-60%, trivial AR    Patient Active Problem List   Diagnosis Date Noted  . Cellulitis 12/11/2018  . Port-A-Cath in place 11/15/2018  . Bone metastases ([place]) 10/20/2018  . 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 Counseling regarding advance care planning and goals of care 10/20/2018  . Gout 03/21/2016  . Chest pain 02/18/2012  . Alcohol abuse 02/18/2012  . Syncope 05/28/2011  . Palpitations 05/28/2011  . Murmur 05/28/2011    Past Surgical History:  Procedure Laterality Date  . IR IMAGING GUIDED PORT INSERTION  10/26/2018  . NO PAST SURGERIES          Home Medications    Prior to Admission medications   Medication Sig Start Date End Date Taking? Authorizing Provider  LORazepam (ATIVAN) 0.5 MG tablet Take 1 tablet (0.5 mg total) by mouth every 6 (six) hours as needed  (Nausea or vomiting). 06/06/19  Yes Brunetta Genera, MD  oxyCODONE (OXY IR/ROXICODONE) 5 MG immediate release tablet Take 1-2 tablets (5-10 mg total) by mouth every 4 (four) hours as needed for severe pain. 06/14/19  Yes Brunetta Genera, MD  allopurinol (ZYLOPRIM) 100 MG tablet Take 2 tablets (200 mg total) by mouth daily. Patient not taking: Reported on 05/10/2019 01/26/19   Brunetta Genera, MD  clindamycin (CLEOCIN) 300 MG capsule Take 1 capsule (300 mg total) by mouth 4 (four) times daily. X 7 days Patient not taking: Reported on 05/10/2019 12/11/18   Veryl Speak, MD  colchicine 0.6 MG tablet Take 1 tablet (0.6 mg total) by mouth See admin instructions. Take 1.2mg  (2 tablets) followed by .6mg  (1 tablet) 1 hour later. Patient not taking: Reported on 05/10/2019 03/27/18   Molpus, Jenny Reichmann, MD  dexamethasone (DECADRON) 4 MG tablet 4mg  po daily with breakfast for 2 weeks then every other day for 2 weeks Patient not taking: Reported on 05/10/2019 12/24/18   Brunetta Genera, MD  furosemide (LASIX) 20 MG tablet Take 1 tablet (20 mg total) by mouth daily. Patient not taking: Reported on 05/10/2019 11/24/18   Brunetta Genera, MD  lidocaine-prilocaine (EMLA) cream Apply to affected area once Patient not taking: Reported on 05/10/2019 10/20/18   Brunetta Genera, MD  ondansetron (ZOFRAN ODT) 4 MG disintegrating tablet Take 1 tablet (4 mg  total) by mouth every 8 (eight) hours as needed for nausea or vomiting. Patient not taking: Reported on 05/10/2019 10/01/18   Francine Graven, DO  potassium chloride SA (K-DUR,KLOR-CON) 20 MEQ tablet Take 1 tablet (20 mEq total) by mouth daily. With lasix Patient not taking: Reported on 06/20/2019 11/24/18   Brunetta Genera, MD  prochlorperazine (COMPAZINE) 10 MG tablet Take 1 tablet (10 mg total) by mouth every 6 (six) hours as needed (Nausea or vomiting). Patient not taking: Reported on 05/10/2019 10/20/18   Brunetta Genera, MD    Family History Family  History  Problem Relation Age of Onset  . Cancer Mother        Possibly pancreatic. Died at age 58  . Prostate cancer Father   . Breast cancer Cousin   . Diabetes Paternal Uncle   . Heart disease Paternal Uncle     Social History Social History   Tobacco Use  . Smoking status: Never Smoker  . Smokeless tobacco: Never Used  Substance Use Topics  . Alcohol use: Not Currently    Comment: states he has cut back/ every now and again  . Drug use: No     Allergies   Patient has no known allergies.   Review of Systems Review of Systems  All other systems reviewed and are negative.    Physical Exam Updated Vital Signs BP 139/80   Pulse 88   Temp 98.8 F (37.1 C) (Oral)   Resp 18   Wt 79 kg   SpO2 99%   BMI 27.28 kg/m   Physical Exam Vitals signs and nursing note reviewed.  Constitutional:      General: He is not in acute distress.    Appearance: Normal appearance. He is well-developed. He is not toxic-appearing.  HENT:     Head: Normocephalic and atraumatic.  Eyes:     General: Lids are normal.     Conjunctiva/sclera: Conjunctivae normal.     Pupils: Pupils are equal, round, and reactive to light.  Neck:     Musculoskeletal: Normal range of motion and neck supple.     Thyroid: No thyroid mass.     Trachea: No tracheal deviation.  Cardiovascular:     Rate and Rhythm: Normal rate and regular rhythm.     Heart sounds: Normal heart sounds. No murmur. No gallop.   Pulmonary:     Effort: Pulmonary effort is normal. No respiratory distress.     Breath sounds: Normal breath sounds. No stridor. No decreased breath sounds, wheezing, rhonchi or rales.  Abdominal:     General: Bowel sounds are normal. There is no distension.     Palpations: Abdomen is soft.     Tenderness: There is no abdominal tenderness. There is no guarding or rebound.  Musculoskeletal: Normal range of motion.        General: No tenderness.  Skin:    General: Skin is warm and dry.     Findings:  No abrasion or rash.  Neurological:     Mental Status: He is alert and oriented to person, place, and time.     GCS: GCS eye subscore is 4. GCS verbal subscore is 5. GCS motor subscore is 6.     Cranial Nerves: No cranial nerve deficit.     Sensory: No sensory deficit.  Psychiatric:        Speech: Speech normal.        Behavior: Behavior normal.      ED Treatments / Results  Labs (all labs ordered are listed, but only abnormal results are displayed) Labs Reviewed  COMPREHENSIVE METABOLIC PANEL - Abnormal; Notable for the following components:      Result Value   Sodium 133 (*)    Glucose, Bld 161 (*)    BUN 24 (*)    Total Protein 8.9 (*)    Total Bilirubin 1.3 (*)    All other components within normal limits  CBC - Abnormal; Notable for the following components:   WBC 16.0 (*)    Hemoglobin 12.5 (*)    Platelets 121 (*)    All other components within normal limits  LIPASE, BLOOD  URINALYSIS, ROUTINE W REFLEX MICROSCOPIC    EKG None  Radiology Ct Abdomen Pelvis W Contrast  Result Date: 06/19/2019 CLINICAL DATA:  Metastatic high-grade neuroendocrine small cell carcinoma. Acute abdominal pain with diarrhea EXAM: CT ABDOMEN AND PELVIS WITH CONTRAST TECHNIQUE: Multidetector CT imaging of the abdomen and pelvis was performed using the standard protocol following bolus administration of intravenous contrast. CONTRAST:  177mL OMNIPAQUE IOHEXOL 300 MG/ML  SOLN COMPARISON:  CT 03/15/2019 chest and pelvis, CT thorax 05/10/2019 FINDINGS: Lower chest: Lung bases are clear. Hepatobiliary: Again demonstrated multifocal hepatic metastasis. There is some differing technique however overall impression is that lesions are increased in size. For example 5.6 cm lesion the RIGHT hepatic lobe (image 32/2) compares to 4.3. Lesion just superior to the falciform ligament measures 3.1 cm (image 26/2) compared to 3.4 cm. Subcapsular lesion in the anterior LEFT hepatic lobe measures 3.3 cm compared to  3.1. Overall size of the liver is increased measuring 27.8 x 14.1 cm in axial dimension (image 27/2 compared to 25.6 x 13.7 cm. Liver measures 20 cm in craniocaudad dimension compared to 15 cm. Portal veins are patent.  No ascites. Pancreas: Pancreas is normal. No ductal dilatation. No pancreatic inflammation. Spleen: Adrenal glands and kidneys are normal. Small exophytic lesion upper pole of the RIGHT kidney measuring 2.1 cm 1.6 cm unchanged. Adrenals/urinary tract: Adrenal glands and kidneys are normal. The ureters and bladder normal. Stomach/Bowel: Stomach, small bowel, appendix, and cecum are normal. The colon and rectosigmoid colon are normal. Vascular/Lymphatic: Abdominal aorta is normal caliber. No periportal or retroperitoneal adenopathy. No pelvic adenopathy. Reproductive: Prostate normal Other: No free fluid. Musculoskeletal: Multiple round sclerotic metastasis in the pelvis and spine unchanged. IMPRESSION: 1. Widespread hepatic metastasis. Individual lesions are difficult to measure however the overall size of the liver is increased significantly and therefore findings are consistent with progression of hepatic metastasis compared to CT of 03/15/2019). 2. No bowel obstruction. 3. No abdominopelvic lymphadenopathy. 4. Stable enhancing small RIGHT renal mass. 5. Stable sclerotic skeletal metastasis. Electronically Signed   By: Suzy Bouchard M.D.   On: 06/19/2019 12:28    Procedures Procedures (including critical care time)  Medications Ordered in ED Medications  sodium chloride flush (NS) 0.9 % injection 3 mL (has no administration in time range)     Initial Impression / Assessment and Plan / ED Course  I have reviewed the triage vital signs and the nursing notes.  Pertinent labs & imaging results that were available during my care of the patient were reviewed by me and considered in my medical decision making (see chart for details).        Patient's labs reviewed and slight  elevation of his white blood cell count however patient is afebrile.  Patient given Bentyl feels better.  No evidence of acute surgical abdomen.  Return precautions given  Final  Clinical Impressions(s) / ED Diagnoses   Final diagnoses:  None    ED Discharge Orders    None       Lacretia Leigh, MD 06/20/19 2105

## 2019-06-20 NOTE — ED Notes (Signed)
An After Visit Summary was printed and given to the patient. Discharge instructions given and no further questions at this time.  

## 2019-06-27 ENCOUNTER — Other Ambulatory Visit: Payer: Self-pay | Admitting: Hematology

## 2019-06-27 ENCOUNTER — Inpatient Hospital Stay: Payer: Medicaid Other | Attending: Hematology

## 2019-06-27 ENCOUNTER — Other Ambulatory Visit: Payer: Self-pay

## 2019-06-27 VITALS — BP 132/72 | HR 72 | Temp 98.2°F | Resp 18

## 2019-06-27 DIAGNOSIS — Z95828 Presence of other vascular implants and grafts: Secondary | ICD-10-CM

## 2019-06-27 DIAGNOSIS — C7951 Secondary malignant neoplasm of bone: Secondary | ICD-10-CM | POA: Diagnosis not present

## 2019-06-27 DIAGNOSIS — C787 Secondary malignant neoplasm of liver and intrahepatic bile duct: Secondary | ICD-10-CM | POA: Insufficient documentation

## 2019-06-27 DIAGNOSIS — K7460 Unspecified cirrhosis of liver: Secondary | ICD-10-CM | POA: Diagnosis not present

## 2019-06-27 DIAGNOSIS — Z7189 Other specified counseling: Secondary | ICD-10-CM

## 2019-06-27 DIAGNOSIS — Z79899 Other long term (current) drug therapy: Secondary | ICD-10-CM | POA: Insufficient documentation

## 2019-06-27 DIAGNOSIS — C7A1 Malignant poorly differentiated neuroendocrine tumors: Secondary | ICD-10-CM | POA: Insufficient documentation

## 2019-06-27 DIAGNOSIS — C801 Malignant (primary) neoplasm, unspecified: Secondary | ICD-10-CM

## 2019-06-27 MED ORDER — DENOSUMAB 120 MG/1.7ML ~~LOC~~ SOLN
SUBCUTANEOUS | Status: AC
  Filled 2019-06-27: qty 1.7

## 2019-06-27 MED ORDER — DENOSUMAB 120 MG/1.7ML ~~LOC~~ SOLN
120.0000 mg | Freq: Once | SUBCUTANEOUS | Status: AC
  Administered 2019-06-27: 11:00:00 120 mg via SUBCUTANEOUS

## 2019-06-27 NOTE — Progress Notes (Signed)
Ok to use labs from 06/20/19 for xgeva injection today per Dr. Irene Limbo

## 2019-06-27 NOTE — Patient Instructions (Signed)

## 2019-07-01 ENCOUNTER — Encounter: Payer: Self-pay | Admitting: Hematology

## 2019-07-01 ENCOUNTER — Other Ambulatory Visit: Payer: Self-pay | Admitting: Hematology

## 2019-07-01 MED ORDER — OXYCODONE HCL 5 MG PO TABS: 5.0000 mg | ORAL_TABLET | ORAL | 0 refills | Status: DC | PRN

## 2019-07-18 ENCOUNTER — Inpatient Hospital Stay: Payer: Medicaid Other | Attending: Hematology

## 2019-07-18 ENCOUNTER — Encounter (HOSPITAL_COMMUNITY): Payer: Medicaid Other

## 2019-07-18 ENCOUNTER — Ambulatory Visit (HOSPITAL_COMMUNITY): Payer: Medicaid Other

## 2019-07-18 DIAGNOSIS — C7951 Secondary malignant neoplasm of bone: Secondary | ICD-10-CM | POA: Insufficient documentation

## 2019-07-18 DIAGNOSIS — Z79899 Other long term (current) drug therapy: Secondary | ICD-10-CM | POA: Insufficient documentation

## 2019-07-18 DIAGNOSIS — K7460 Unspecified cirrhosis of liver: Secondary | ICD-10-CM | POA: Insufficient documentation

## 2019-07-18 DIAGNOSIS — C7A1 Malignant poorly differentiated neuroendocrine tumors: Secondary | ICD-10-CM | POA: Insufficient documentation

## 2019-07-18 DIAGNOSIS — C787 Secondary malignant neoplasm of liver and intrahepatic bile duct: Secondary | ICD-10-CM | POA: Insufficient documentation

## 2019-07-19 ENCOUNTER — Telehealth: Payer: Self-pay | Admitting: Hematology

## 2019-07-19 NOTE — Telephone Encounter (Signed)
Returned patient's phone call regarding 09/14 appointments, rescheduled 09/14 and 09/21 appointments per patient's request they have been moved to 09/23 and 09/25.

## 2019-07-25 ENCOUNTER — Ambulatory Visit: Payer: Medicaid Other

## 2019-07-25 ENCOUNTER — Ambulatory Visit: Payer: Medicaid Other | Admitting: Hematology

## 2019-07-27 ENCOUNTER — Encounter (HOSPITAL_COMMUNITY)
Admission: RE | Admit: 2019-07-27 | Discharge: 2019-07-27 | Disposition: A | Discharge status: Home / Self Care | Payer: Medicaid Other | Source: Ambulatory Visit | Attending: Hematology | Admitting: Hematology

## 2019-07-27 ENCOUNTER — Other Ambulatory Visit: Payer: Self-pay

## 2019-07-27 ENCOUNTER — Inpatient Hospital Stay: Payer: Medicaid Other

## 2019-07-27 ENCOUNTER — Ambulatory Visit (HOSPITAL_COMMUNITY)
Admission: RE | Admit: 2019-07-27 | Discharge: 2019-07-27 | Disposition: A | Discharge status: Home / Self Care | Payer: Medicaid Other | Source: Ambulatory Visit | Attending: Hematology | Admitting: Hematology

## 2019-07-27 DIAGNOSIS — Z79899 Other long term (current) drug therapy: Secondary | ICD-10-CM | POA: Diagnosis not present

## 2019-07-27 DIAGNOSIS — C801 Malignant (primary) neoplasm, unspecified: Secondary | ICD-10-CM | POA: Diagnosis present

## 2019-07-27 DIAGNOSIS — K7460 Unspecified cirrhosis of liver: Secondary | ICD-10-CM | POA: Diagnosis not present

## 2019-07-27 DIAGNOSIS — C787 Secondary malignant neoplasm of liver and intrahepatic bile duct: Secondary | ICD-10-CM | POA: Insufficient documentation

## 2019-07-27 DIAGNOSIS — C7951 Secondary malignant neoplasm of bone: Secondary | ICD-10-CM | POA: Diagnosis not present

## 2019-07-27 DIAGNOSIS — C7A1 Malignant poorly differentiated neuroendocrine tumors: Secondary | ICD-10-CM | POA: Diagnosis not present

## 2019-07-27 LAB — CBC WITH DIFFERENTIAL/PLATELET
Abs Immature Granulocytes: 0.01 10*3/uL (ref 0.00–0.07)
Basophils Absolute: 0 10*3/uL (ref 0.0–0.1)
Basophils Relative: 0 %
Eosinophils Absolute: 0.1 10*3/uL (ref 0.0–0.5)
Eosinophils Relative: 3 %
HCT: 41.4 % (ref 39.0–52.0)
Hemoglobin: 13.2 g/dL (ref 13.0–17.0)
Immature Granulocytes: 0 %
Lymphocytes Relative: 27 %
Lymphs Abs: 1.4 10*3/uL (ref 0.7–4.0)
MCH: 26.6 pg (ref 26.0–34.0)
MCHC: 31.9 g/dL (ref 30.0–36.0)
MCV: 83.5 fL (ref 80.0–100.0)
Monocytes Absolute: 0.6 10*3/uL (ref 0.1–1.0)
Monocytes Relative: 11 %
Neutro Abs: 2.9 10*3/uL (ref 1.7–7.7)
Neutrophils Relative %: 59 %
Platelets: 116 10*3/uL — ABNORMAL LOW (ref 150–400)
RBC: 4.96 MIL/uL (ref 4.22–5.81)
RDW: 14.3 % (ref 11.5–15.5)
WBC: 5 10*3/uL (ref 4.0–10.5)
nRBC: 0 % (ref 0.0–0.2)

## 2019-07-27 LAB — CMP (CANCER CENTER ONLY)
ALT: 16 U/L (ref 0–44)
AST: 22 U/L (ref 15–41)
Albumin: 4 g/dL (ref 3.5–5.0)
Alkaline Phosphatase: 163 U/L — ABNORMAL HIGH (ref 38–126)
Anion gap: 11 (ref 5–15)
BUN: 11 mg/dL (ref 6–20)
CO2: 26 mmol/L (ref 22–32)
Calcium: 9.9 mg/dL (ref 8.9–10.3)
Chloride: 101 mmol/L (ref 98–111)
Creatinine: 1.11 mg/dL (ref 0.61–1.24)
GFR, Est AFR Am: >60 mL/min (ref >60)
GFR, Estimated: >60 mL/min (ref >60)
Glucose, Bld: 101 mg/dL — ABNORMAL HIGH (ref 70–99)
Potassium: 4.3 mmol/L (ref 3.5–5.1)
Sodium: 138 mmol/L (ref 135–145)
Total Bilirubin: 0.8 mg/dL (ref 0.3–1.2)
Total Protein: 9.7 g/dL — ABNORMAL HIGH (ref 6.5–8.1)

## 2019-07-27 MED ORDER — IOHEXOL 300 MG/ML  SOLN
100.0000 mL | Freq: Once | INTRAMUSCULAR | Status: AC | PRN
  Administered 2019-07-27: 100 mL via INTRAVENOUS

## 2019-07-27 MED ORDER — TECHNETIUM TC 99M MEDRONATE IV KIT
21.2000 | PACK | Freq: Once | INTRAVENOUS | Status: AC | PRN
  Administered 2019-07-27: 10:00:00 21.2 via INTRAVENOUS

## 2019-07-27 MED ORDER — SODIUM CHLORIDE (PF) 0.9 % IJ SOLN
INTRAMUSCULAR | Status: AC
  Filled 2019-07-27: qty 50

## 2019-07-28 ENCOUNTER — Encounter: Payer: Self-pay | Admitting: Hematology

## 2019-07-28 ENCOUNTER — Other Ambulatory Visit: Payer: Self-pay | Admitting: Hematology

## 2019-07-28 MED ORDER — OXYCODONE HCL 5 MG PO TABS: 5.0000 mg | ORAL_TABLET | ORAL | 0 refills | Status: DC | PRN

## 2019-07-28 NOTE — Telephone Encounter (Signed)
Opened in error

## 2019-07-29 ENCOUNTER — Inpatient Hospital Stay: Payer: Medicaid Other

## 2019-07-29 ENCOUNTER — Ambulatory Visit: Payer: Medicaid Other | Admitting: Hematology

## 2019-07-29 ENCOUNTER — Inpatient Hospital Stay (HOSPITAL_BASED_OUTPATIENT_CLINIC_OR_DEPARTMENT_OTHER): Payer: Medicaid Other | Admitting: Hematology

## 2019-07-29 ENCOUNTER — Ambulatory Visit: Payer: Medicaid Other

## 2019-07-29 ENCOUNTER — Other Ambulatory Visit: Payer: Self-pay

## 2019-07-29 VITALS — BP 152/65 | HR 62 | Temp 98.3°F | Resp 17 | Ht 67.0 in | Wt 165.6 lb

## 2019-07-29 DIAGNOSIS — C801 Malignant (primary) neoplasm, unspecified: Secondary | ICD-10-CM | POA: Diagnosis not present

## 2019-07-29 DIAGNOSIS — C7A1 Malignant poorly differentiated neuroendocrine tumors: Secondary | ICD-10-CM | POA: Diagnosis not present

## 2019-07-29 DIAGNOSIS — Z95828 Presence of other vascular implants and grafts: Secondary | ICD-10-CM

## 2019-07-29 DIAGNOSIS — C7951 Secondary malignant neoplasm of bone: Secondary | ICD-10-CM

## 2019-07-29 DIAGNOSIS — Z7189 Other specified counseling: Secondary | ICD-10-CM

## 2019-07-29 DIAGNOSIS — C787 Secondary malignant neoplasm of liver and intrahepatic bile duct: Secondary | ICD-10-CM | POA: Diagnosis not present

## 2019-07-29 MED ORDER — DENOSUMAB 120 MG/1.7ML ~~LOC~~ SOLN
SUBCUTANEOUS | Status: AC
  Filled 2019-07-29: qty 1.7

## 2019-07-29 MED ORDER — DENOSUMAB 120 MG/1.7ML ~~LOC~~ SOLN
120.0000 mg | Freq: Once | SUBCUTANEOUS | Status: AC
  Administered 2019-07-29: 13:00:00 120 mg via SUBCUTANEOUS

## 2019-07-29 NOTE — Progress Notes (Signed)
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  HISTORY OF PRESENTING ILLNESS:   [name] is a wonderful 58 y.o. male who has been referred to [place] by Dr. Francine Hernandez for evaluation and management of Liver masses. He is accompanied today by his wife and aunt. The pt reports that he is doing well overall.   The pt presented to the ED on 10/01/18 regarding persistent RUQ abdominal pain which began a month prior, and noted associations with nausea and 40 pound weight loss. He was evaluated with a CT A/P, as noted below, which revealed liver masses.   The pt reports that he first began feeling RUQ abdominal pain about 3-4 weeks ago, but began losing weight about 2 months ago. He notes that he began feeling more fatigue about 2 months ago as well.  The pt notes that the color of his bowels became dark black and denies taking Iron pills or bismuth medications. He notes that he began feeling constipated as well, feeling backed up. He notes that his stools began looking thinner in caliber. The pt notes that his appetite began decreasing over the last 3-4 weeks ago, and some of this he attributes to intermittent nausea. He denies problems swallowing food. He endorses RUQ pain, and denies vomiting and new cough, new bone pains, testicular pain or swelling, problems passing urine, blood in the urine. The pt has never had a colonoscopy and does not have a PCP, and cites his lack of health insurance as the reason.  The pt was released from the ED with Oxycodone and has been using this to treat his RUQ pain successfully.   The pt notes gout and a heart murmur as previous medical history. He notes that he hasn't consumed alcohol in the past 2 years whatsoever, but prior to this alcohol was a problem for  him.   Of note prior to the patient's visit today, pt has had a CT A/P completed on 10/01/18 with results revealing Interval development of numerous ill-defined hypodense masses throughout the entire liver compatible with metastatic disease. There is a 2.5 cm enhancing mass over the upper pole cortex of the right kidney suspicious for renal cell carcinoma. 2.  No acute findings in the abdomen/pelvis. 3. 1 cm hypodensity over the pancreatic tail not well seen on previous noncontrast exams. MR may be helpful for further evaluation on an elective basis.   Most recent lab results (10/06/18) of CBC w/diff and CMP is as follows: all values are WNL except for Monocytes abs at 1.2k, Sodium at 134, Albumin at 3.0, AST at 77, ALT at 63, Alk Phos at 392, Total Bilirubin at 2.3   On review of systems, pt reports new constipation, smaller caliber stools, dark stools, unexpected weight loss, RUQ pain, new fatigue, and denies vomiting, cough, new SOB, changes in breathing, new bone pains, leg swelling, blood in the urine, changes in urination, fevers, pain along the spine, and any other symptoms.   On PMHx the pt reports Gout, heart murmur, previous alcohol abuse. He denies any surgeries.  On Social Hx the pt reports that he hasn't consumed alcohol in the past 2 years whatsoever. He notes that alcohol abuse was previously a concern. He denies ever smoking cigarettes or doing drugs. He denies any radiation or chemical exposure.  On Family Hx  the pt reports mother with pancreatic cancer in 10s, maternal aunts with high blood pressure and heart failure, maternal aunt with breast cancer.   INTERVAL HISTORY: [name] returns today for management, evaluation, after C6 treatment of his recently diagnosed High grade neuroendocrine carcinoma. The patient's last visit with [place] was on 05/23/2019. He is joined by his brother [name], via phone. The pt reports that he is doing well overall.  Pt reports that he has been eating  steady and trying to avoid red meats. His brother confirms that pt has been eating healthier and trying to stay active. Pt switches between taking calcium and Ensure every other day. Pt denies abdominal pain but notes abdominal discomfort when eating. He states that it feels like he is very full quickly and his discomfort is accompanied by nausea. He is taking his nausea medication when necessary and believes that it is helping. Pt takes at most 2 Oxycodone pills per day which is able to control his pain. He has lost about 9 lbs over the last month but he is not concerned about this weight loss. He does not notice a significant change in his breathing habits but admits that he does have occasional SOB. Pt also denies changes in his bowel movements, although he feels that he could be moving his bowels more. Pt notes intermittent sharp pain in his left shoulder. Pt endorses better sleep.   Of note since the patient's last visit, pt has had CT abdomen and pelvis completed on 06/19/2019 with results revealing "Widespread hepatic metastasis. Individual lesions are difficult to measure however the overall size of the liver is increased significantly and therefore findings are consistent with progression of hepatic metastasis compared to CT of 03/15/2019). No bowel obstruction. No abdominopelvic lymphadenopathy. Stable enhancing small RIGHT renal mass. Stable sclerotic skeletal metastasis."  Pt had CT of the chest abdomen and pelvis completed on 07/27/2019 with results revealing "Redemonstrated hepatomegaly with numerous bulky hypodense metastatic lesions, generally ill-defined and not significantly changed in size compared to prior examination, a partially exophytic index lesion of the anterior right lobe of the liver measuring 3.3 cm, previously 3.3 cm (series 2, image 58), an index lesion of the lateral right lobe of the liver measuring 5.6 cm, previously 5.6 cm (series 2, image 56), an index lesion of the anterior  left lobe of the liver measuring 4.3 cm, previously 4.5 cm (series 2, image 50). Unchanged sclerotic osseous lesions, including of the T12 vertebral body, L4 vertebral body and bilateral proximal femurs (series 4, image 94, 85, 67, 70). Findings are consistent with unchanged metastatic disease without new evidence of metastasis in the chest, abdomen, or pelvis. There is a redemonstrated, exophytic small mass of the superior pole of the right kidney, best appreciated on coronal series measuring approximately 1.9 x 1.4 cm (series 4, image 87), not significantly changed compared to prior examinations and remain concerning for a small incidental renal cell carcinoma. Aortic Atherosclerosis (ICD10-I70.0) and Emphysema (ICD10-J43.9)."    Pt had a whole body bone scan completed on 07/27/2019 with results revealing Scattered osseous metastases as above, including proximal LEFT humerus, RIGHT humeral diaphysis, and BILATERAL femoral diaphyses.  Lab results today (07/27/19) of CBC w/diff and CMP is as follows: all values are WNL except for PLTs at 116, Glucose at 101, Total Protein at 9.7, Alkaline Phosphatase at 163.  On review of systems, pt reports nausea, occasional SOB, intermittent left shoulder pain, weight loss and denies fatigue, abdominal pain, back pain, hip pain, thigh  pain, bowel habit changes, changes in urination, leg swelling and any other symptoms.    MEDICAL HISTORY:  Past Medical History:  Diagnosis Date   Arthritis    "knees; left hand"   Cancer ([place])    Gout    Heart murmur    Longstanding. Mild aortic valve thickening with trivial AR by echo 02/2012   Hypertension    Near syncope    05/2011 with tachypalpitations & with echo showing mild LVH, normal EF 55-60%, trivial AR    SURGICAL HISTORY: Past Surgical History:  Procedure Laterality Date   IR IMAGING GUIDED PORT INSERTION  10/26/2018   NO PAST SURGERIES      SOCIAL HISTORY: Social History   Socioeconomic History     Marital status: Married    Spouse name: Not on file   Number of children: 1   Years of education: Not on file   Highest education level: Not on file  Occupational History   Occupation: Unemployed  Scientist, product/process development strain: Not on file   Food insecurity    Worry: Not on file    Inability: Not on file   Transportation needs    Medical: Not on file    Non-medical: Not on file  Tobacco Use   Smoking status: Never Smoker   Smokeless tobacco: Never Used  Substance and Sexual Activity   Alcohol use: Not Currently    Comment: states he has cut back/ every now and again   Drug use: No   Sexual activity: Yes    Partners: Female  Lifestyle   Physical activity    Days per week: Not on file    Minutes per session: Not on file   Stress: Not on file  Relationships   Social connections    Talks on phone: Not on file    Gets together: Not on file    Attends religious service: Not on file    Active member of club or organization: Not on file    Attends meetings of clubs or organizations: Not on file    Relationship status: Not on file   Intimate partner violence    Fear of current or ex partner: Not on file    Emotionally abused: Not on file    Physically abused: Not on file    Forced sexual activity: Not on file  Other Topics Concern   Not on file  Social History Narrative   Married. 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 Is from [place].     FAMILY HISTORY: Family History  Problem Relation Age of Onset   Cancer Mother        Possibly pancreatic. Died at age 61   Prostate cancer Father    Breast cancer Cousin    Diabetes Paternal Uncle    Heart disease Paternal Uncle     ALLERGIES:  has No Known Allergies.  MEDICATIONS:  Current Outpatient Medications  Medication Sig Dispense Refill   allopurinol (ZYLOPRIM) 100 MG tablet Take 2 tablets (200 mg total) by mouth daily. (Patient not  taking: Reported on 05/10/2019) 60 tablet 1   clindamycin (CLEOCIN) 300 MG capsule Take 1 capsule (300 mg total) by mouth 4 (four) times daily. X 7 days (Patient not taking: Reported on 05/10/2019) 28 capsule 0   colchicine 0.6 MG tablet Take 1 tablet (0.6 mg total) by mouth See admin instructions. Take 1.65m (2 tablets) followed by .658m(1 tablet) 1 hour later. (  Patient not taking: Reported on 05/10/2019) 3 tablet 0   dexamethasone (DECADRON) 4 MG tablet 92m po daily with breakfast for 2 weeks then every other day for 2 weeks (Patient not taking: Reported on 05/10/2019) 30 tablet 0   furosemide (LASIX) 20 MG tablet Take 1 tablet (20 mg total) by mouth daily. (Patient not taking: Reported on 05/10/2019) 30 tablet 1   lidocaine-prilocaine (EMLA) cream Apply to affected area once (Patient not taking: Reported on 05/10/2019) 30 g 3   LORazepam (ATIVAN) 0.5 MG tablet Take 1 tablet (0.5 mg total) by mouth every 6 (six) hours as needed (Nausea or vomiting). 30 tablet 0   ondansetron (ZOFRAN ODT) 4 MG disintegrating tablet Take 1 tablet (4 mg total) by mouth every 8 (eight) hours as needed for nausea or vomiting. (Patient not taking: Reported on 05/10/2019) 6 tablet 0   oxyCODONE (OXY IR/ROXICODONE) 5 MG immediate release tablet Take 1-2 tablets (5-10 mg total) by mouth every 4 (four) hours as needed for severe pain. 120 tablet 0   potassium chloride SA (K-DUR,KLOR-CON) 20 MEQ tablet Take 1 tablet (20 mEq total) by mouth daily. With lasix (Patient not taking: Reported on 06/20/2019) 30 tablet 1   prochlorperazine (COMPAZINE) 10 MG tablet Take 1 tablet (10 mg total) by mouth every 6 (six) hours as needed (Nausea or vomiting). (Patient not taking: Reported on 05/10/2019) 30 tablet 1   No current facility-administered medications for this visit.    Facility-Administered Medications Ordered in Other Visits  Medication Dose Route Frequency Provider Last Rate Last Dose   denosumab (XGEVA) injection 120 mg  120 mg  Subcutaneous Once KIrene Limbo GCloria Spring MD        REVIEW OF SYSTEMS:   A 10+ POINT REVIEW OF SYSTEMS WAS OBTAINED including neurology, dermatology, psychiatry, cardiac, respiratory, lymph, extremities, GI, GU, Musculoskeletal, constitutional, breasts, reproductive, HEENT.  All pertinent positives are noted in the HPI.  All others are negative.    PHYSICAL EXAMINATION: ECOG FS:1 - Symptomatic but completely ambulatory  There were no vitals filed for this visit. Wt Readings from Last 3 Encounters:  06/20/19 174 lb 2.6 oz (79 kg)  05/23/19 173 lb 14.4 oz (78.9 kg)  03/23/19 180 lb 3.2 oz (81.7 kg)   There is no height or weight on file to calculate BMI.    GENERAL:alert, in no acute distress and comfortable SKIN: no acute rashes, no significant lesions EYES: conjunctiva are pink and non-injected, sclera anicteric OROPHARYNX: MMM, no exudates, no oropharyngeal erythema or ulceration NECK: supple, no JVD LYMPH:  no palpable lymphadenopathy in the cervical, axillary or inguinal regions LUNGS: clear to auscultation b/l with normal respiratory effort HEART: regular rate & rhythm ABDOMEN:  normoactive bowel sounds , non tender, not distended. Extremity: no pedal edema PSYCH: alert & oriented x 3 with fluent speech NEURO: no focal motor/sensory deficits     LABORATORY DATA:  I have reviewed the data as listed  . CBC Latest Ref Rng & Units 07/27/2019 06/20/2019 06/19/2019  WBC 4.0 - 10.5 K/uL 5.0 16.0(H) 11.1(H)  Hemoglobin 13.0 - 17.0 g/dL 13.2 12.5(L) 13.2  Hematocrit 39.0 - 52.0 % 41.4 40.2 43.1  Platelets 150 - 400 K/uL 116(L) 121(L) 135(L)   CMP Latest Ref Rng & Units 07/27/2019 06/20/2019 06/19/2019  Glucose 70 - 99 mg/dL 101(H) 161(H) 179(H)  BUN 6 - 20 mg/dL 11 24(H) 17  Creatinine 0.61 - 1.24 mg/dL 1.11 1.18 1.09  Sodium 135 - 145 mmol/L 138 133(L) 136  Potassium 3.5 -  5.1 mmol/L 4.3 4.4 3.4(L)  Chloride 98 - 111 mmol/L 101 101 100  CO2 22 - 32 mmol/L '26 25 22  ' Calcium 8.9  - 10.3 mg/dL 9.9 9.3 10.3  Total Protein 6.5 - 8.1 g/dL 9.7(H) 8.9(H) 9.3(H)  Total Bilirubin 0.3 - 1.2 mg/dL 0.8 1.3(H) 1.0  Alkaline Phos 38 - 126 U/L 163(H) 116 150(H)  AST 15 - 41 U/L 22 31 35  ALT 0 - 44 U/L 16 28 32   Lab Results  Component Value Date   LDH 163 05/23/2019    03/31/2019 Mismatch Repair Protein Report   10/08/18 Liver Biopsy:    RADIOGRAPHIC STUDIES: I have personally reviewed the radiological images as listed and agreed with the findings in the report. Ct Chest W Contrast  Result Date: 07/27/2019 CLINICAL DATA:  Metastatic neuroendocrine carcinoma, unclear primary, evaluate for progression EXAM: CT CHEST, ABDOMEN, AND PELVIS WITH CONTRAST TECHNIQUE: Multidetector CT imaging of the chest, abdomen and pelvis was performed following the standard protocol during bolus administration of intravenous contrast. CONTRAST:  171m OMNIPAQUE IOHEXOL 300 MG/ML SOLN, additional oral enteric contrast COMPARISON:  06/19/2019, 03/15/2019 FINDINGS: CT CHEST FINDINGS Cardiovascular: Right chest port catheter. Normal heart size. No pericardial effusion. Mediastinum/Nodes: No enlarged mediastinal, hilar, or axillary lymph nodes. Thyroid gland, trachea, and esophagus demonstrate no significant findings. Lungs/Pleura: Bandlike scarring of the dependent bilateral lung bases. No pleural effusion or pneumothorax. Musculoskeletal: No chest wall mass. CT ABDOMEN PELVIS FINDINGS Hepatobiliary: Redemonstrated hepatomegaly with numerous bulky hypodense metastatic lesions, generally ill-defined and not significantly changed in size compared to prior examination, a partially exophytic index lesion of the anterior right lobe of the liver measuring 3.3 cm, previously 3.3 cm (series 2, image 58), an index lesion of the lateral right lobe of the liver measuring 5.6 cm, previously 5.6 cm (series 2, image 56), an index lesion of the anterior left lobe of the liver measuring 4.3 cm, previously 4.5 cm (series 2,  image 50). No gallstones, gallbladder wall thickening, or biliary dilatation. Pancreas: Unremarkable. No pancreatic ductal dilatation or surrounding inflammatory changes. Spleen: Normal in size without significant abnormality. Adrenals/Urinary Tract: Adrenal glands are unremarkable. There is a redemonstrated, exophytic small mass of the superior pole of the right kidney, best appreciated on coronal series measuring approximately 1.9 x 1.4 cm (series 4, image 87). Kidneys are otherwise normal, without renal calculi, or hydronephrosis. Bladder is unremarkable. Stomach/Bowel: Stomach is within normal limits. Appendix appears normal. No evidence of bowel wall thickening, distention, or inflammatory changes. Large burden of stool and stool balls in the colon and rectum. Vascular/Lymphatic: Aortic atherosclerosis. No enlarged abdominal or pelvic lymph nodes. Reproductive: No mass or other abnormality. Other: No abdominal wall hernia or abnormality. No abdominopelvic ascites. Musculoskeletal: Unchanged sclerotic osseous lesions, including of the T12 vertebral body, L4 vertebral body and bilateral proximal femurs (series 4, image 94, 85, 67, 70). IMPRESSION: 1. Redemonstrated hepatomegaly with numerous bulky hypodense metastatic lesions, generally ill-defined and not significantly changed in size compared to prior examination, a partially exophytic index lesion of the anterior right lobe of the liver measuring 3.3 cm, previously 3.3 cm (series 2, image 58), an index lesion of the lateral right lobe of the liver measuring 5.6 cm, previously 5.6 cm (series 2, image 56), an index lesion of the anterior left lobe of the liver measuring 4.3 cm, previously 4.5 cm (series 2, image 50). 2. Unchanged sclerotic osseous lesions, including of the T12 vertebral body, L4 vertebral body and bilateral proximal femurs (series 4, image  94, 85, 67, 70). 3. Findings are consistent with unchanged metastatic disease without new evidence of  metastasis in the chest, abdomen, or pelvis. 4. There is a redemonstrated, exophytic small mass of the superior pole of the right kidney, best appreciated on coronal series measuring approximately 1.9 x 1.4 cm (series 4, image 87), not significantly changed compared to prior examinations and remain concerning for a small incidental renal cell carcinoma. 5. Aortic Atherosclerosis (ICD10-I70.0) and Emphysema (ICD10-J43.9). Electronically Signed   By: Eddie Candle M.D.   On: 07/27/2019 13:58   Nm Bone Scan Whole Body  Result Date: 07/28/2019 CLINICAL DATA:  Metastatic spine tumor, metastatic small cell carcinoma of unknown primary with hepatic and bone metastases EXAM: NUCLEAR MEDICINE WHOLE BODY BONE SCAN TECHNIQUE: Whole body anterior and posterior images were obtained approximately 3 hours after intravenous injection of radiopharmaceutical. RADIOPHARMACEUTICALS:  21.2 mCi Technetium-64mMDP IV COMPARISON:  None Correlation: PET-CT 10/15/2018, CT chest abdomen pelvis 07/27/2019 FINDINGS: Focal increased tracer accumulation again identified at the T11 vertebral body consistent with osseous metastasis. Additional foci of abnormal increased tracer accumulation identified at manubrium, LEFT scapula, RIGHT humeral diaphysis, proximal LEFT humerus, RIGHT SI joint, and BILATERAL femoral diaphyses. Multiple additional sites of osseous metastatic disease identified on prior PET CT and CT exams are not scintigraphically evident, including multiple sites in the thoracic and lumbar spine. Expected urinary tract and soft tissue distribution of tracer. IMPRESSION: Scattered osseous metastases as above, including proximal LEFT humerus, RIGHT humeral diaphysis, and BILATERAL femoral diaphyses. Electronically Signed   By: MLavonia DanaM.D.   On: 07/28/2019 09:16   Ct Abdomen Pelvis W Contrast  Result Date: 07/27/2019 CLINICAL DATA:  Metastatic neuroendocrine carcinoma, unclear primary, evaluate for progression EXAM: CT CHEST,  ABDOMEN, AND PELVIS WITH CONTRAST TECHNIQUE: Multidetector CT imaging of the chest, abdomen and pelvis was performed following the standard protocol during bolus administration of intravenous contrast. CONTRAST:  1076mOMNIPAQUE IOHEXOL 300 MG/ML SOLN, additional oral enteric contrast COMPARISON:  06/19/2019, 03/15/2019 FINDINGS: CT CHEST FINDINGS Cardiovascular: Right chest port catheter. Normal heart size. No pericardial effusion. Mediastinum/Nodes: No enlarged mediastinal, hilar, or axillary lymph nodes. Thyroid gland, trachea, and esophagus demonstrate no significant findings. Lungs/Pleura: Bandlike scarring of the dependent bilateral lung bases. No pleural effusion or pneumothorax. Musculoskeletal: No chest wall mass. CT ABDOMEN PELVIS FINDINGS Hepatobiliary: Redemonstrated hepatomegaly with numerous bulky hypodense metastatic lesions, generally ill-defined and not significantly changed in size compared to prior examination, a partially exophytic index lesion of the anterior right lobe of the liver measuring 3.3 cm, previously 3.3 cm (series 2, image 58), an index lesion of the lateral right lobe of the liver measuring 5.6 cm, previously 5.6 cm (series 2, image 56), an index lesion of the anterior left lobe of the liver measuring 4.3 cm, previously 4.5 cm (series 2, image 50). No gallstones, gallbladder wall thickening, or biliary dilatation. Pancreas: Unremarkable. No pancreatic ductal dilatation or surrounding inflammatory changes. Spleen: Normal in size without significant abnormality. Adrenals/Urinary Tract: Adrenal glands are unremarkable. There is a redemonstrated, exophytic small mass of the superior pole of the right kidney, best appreciated on coronal series measuring approximately 1.9 x 1.4 cm (series 4, image 87). Kidneys are otherwise normal, without renal calculi, or hydronephrosis. Bladder is unremarkable. Stomach/Bowel: Stomach is within normal limits. Appendix appears normal. No evidence of  bowel wall thickening, distention, or inflammatory changes. Large burden of stool and stool balls in the colon and rectum. Vascular/Lymphatic: Aortic atherosclerosis. No enlarged abdominal or pelvic lymph nodes. Reproductive:  No mass or other abnormality. Other: No abdominal wall hernia or abnormality. No abdominopelvic ascites. Musculoskeletal: Unchanged sclerotic osseous lesions, including of the T12 vertebral body, L4 vertebral body and bilateral proximal femurs (series 4, image 94, 85, 67, 70). IMPRESSION: 1. Redemonstrated hepatomegaly with numerous bulky hypodense metastatic lesions, generally ill-defined and not significantly changed in size compared to prior examination, a partially exophytic index lesion of the anterior right lobe of the liver measuring 3.3 cm, previously 3.3 cm (series 2, image 58), an index lesion of the lateral right lobe of the liver measuring 5.6 cm, previously 5.6 cm (series 2, image 56), an index lesion of the anterior left lobe of the liver measuring 4.3 cm, previously 4.5 cm (series 2, image 50). 2. Unchanged sclerotic osseous lesions, including of the T12 vertebral body, L4 vertebral body and bilateral proximal femurs (series 4, image 94, 85, 67, 70). 3. Findings are consistent with unchanged metastatic disease without new evidence of metastasis in the chest, abdomen, or pelvis. 4. There is a redemonstrated, exophytic small mass of the superior pole of the right kidney, best appreciated on coronal series measuring approximately 1.9 x 1.4 cm (series 4, image 87), not significantly changed compared to prior examinations and remain concerning for a small incidental renal cell carcinoma. 5. Aortic Atherosclerosis (ICD10-I70.0) and Emphysema (ICD10-J43.9). Electronically Signed   By: Eddie Candle M.D.   On: 07/27/2019 13:58    ASSESSMENT & PLAN:  58 y.o. male with  1. Metastatic High Grade Neuroendocrine Carcinoma with Multiple liver masses  10/01/18 CT A/P revealed Interval  development of numerous ill-defined hypodense masses throughout the entire liver compatible with metastatic disease. There is a 2.5 cm enhancing mass over the upper pole cortex of the right kidney suspicious for renal cell carcinoma. 2.  No acute findings in the abdomen/pelvis. 3. 1 cm hypodensity over the pancreatic tail not well seen on previous noncontrast exams. MR may be helpful for further evaluation on an elective basis.    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 2. Upper pole right renal lesion is not definitely hypermetabolic and has been present since 2012. I think is unlikely the cause of the metastatic disease. Recommend liver biopsy for tissue diagnosis. If needed, an MRI of the abdomen without and with contrast may be helpful for further evaluation of the renal lesion. 3. Two sub 3 mm right upper lobe pulmonary nodules are indeterminate.   10/26/18 MRI Brain revealed No acute abnormality and negative for metastatic disease.  03/15/19 CT C/A/P which revealed "Significant improvement in multifocal liver metastasis. 2. Interval development of multifocal areas of sclerosis within the visualized axial and proximal appendicular skeleton. These are favored to represent areas of healing bone metastases. 3. Stable lesion within distal tail of pancreas. 4. Aortic Atherosclerosis."   S/p 6 cycles of Carboplatin and 20% dose reduced Etoposide completed on 02/19/19. Dose reduced as pt developed significant thrombocytopenia after C1.  2. Abnormal LFTs - likely from liver metastases  Labs upon initial presentation from 10/06/18, some borderline monocytosis at 1.2k, otherwise normal blood counts, AST at 77, ALT at  63, Alk Phos at 392, Total Bilirubin at 2.3   3. Rt renal mass -  seen on 10/15/18 PET/CT but  no longer observed on 03/15/19 CT C/A/P   PLAN: -Discussed pt labwork today, 07/27/19; all values are WNL except for PLTs at 116, Glucose at 101, Total Protein at 9.7, Alkaline Phosphatase at 163. -Discussed 06/19/2019 CT abdomen and pelvis with results revealing "Widespread hepatic metastasis. Individual lesions are difficult to measure however the overall size of the liver is increased significantly and therefore findings are consistent with progression of hepatic metastasis compared to CT of 03/15/2019). No bowel obstruction. No abdominopelvic lymphadenopathy. Stable enhancing small RIGHT renal mass. Stable sclerotic skeletal metastasis." -Discussed 07/27/2019 CT of the chest abdomen and pelvis with results revealing "Redemonstrated hepatomegaly with numerous bulky hypodense metastatic lesions, generally ill-defined and not significantly changed in size compared to prior examination, a partially exophytic index lesion of the anterior right lobe of the liver measuring 3.3 cm, previously 3.3 cm (series 2, image 58), an index lesion of the lateral right lobe of the liver measuring 5.6 cm, previously 5.6 cm (series 2, image 56), an index lesion of the anterior left lobe of the liver measuring 4.3 cm, previously 4.5 cm (series 2, image 50). Unchanged sclerotic osseous lesions, including of the T12 vertebral body, L4 vertebral body and bilateral proximal femurs (series 4, image 94, 85, 67, 70). Findings are consistent with unchanged metastatic disease without new evidence of metastasis in the chest, abdomen, or pelvis. 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 Aortic Atherosclerosis (ICD10-I70.0) and Emphysema (ICD10-J43.9)."   -Discussed 07/27/2019 whole body bone scan with results revealing Scattered osseous metastases as above, including proximal LEFT humerus, RIGHT humeral diaphysis, and BILATERAL femoral diaphyses. -No acute disease found in scans  -Continue Xgeva shots every 4 weeks -Advised pt to increase dietary Calcium  -Discussed risk vs benefit of restarting chemotherapy at this time  -No indication for treatment at this time -Will continue to monitor with labs and scans  -Will see back in 6 weeks with labs  -Will repeat scan in 2-3 months unless symptomology changes  -Advised pt to contact with any new concerns or symptoms    FOLLOW UP: -Please schedule next 3 doses of Xgeva q4weeks -RTC with Dr Tanina Barb in 1 month with labs  The total time spent in the appt was 35 minutes and more than 50% was on counseling and direct patient cares.  All of the patient's questions were answered with apparent satisfaction. The patient knows to call the clinic with any problems, questions or concerns.    Sullivan Lone MD [place] [facility]IVMS [facility] Hematology/Oncology Physician [facility]  (Office):       [phone] (Work cell):  [phone] (Fax):           [phone]  07/29/2019 11:14 AM  I, Yevette Edwards, am acting as a scribe for Dr. Sullivan Lone.   .I have reviewed the above documentation for accuracy and completeness, and I agree with the above. Brunetta Genera MD

## 2019-08-01 ENCOUNTER — Telehealth: Payer: Self-pay | Admitting: Hematology

## 2019-08-01 NOTE — Telephone Encounter (Signed)
Scheduled appt per 9/25 los.  Spoke with patient and he is aware of the appt date and time.

## 2019-08-19 ENCOUNTER — Other Ambulatory Visit: Payer: Self-pay | Admitting: Hematology

## 2019-08-19 DIAGNOSIS — C801 Malignant (primary) neoplasm, unspecified: Secondary | ICD-10-CM

## 2019-08-19 DIAGNOSIS — Z7189 Other specified counseling: Secondary | ICD-10-CM

## 2019-08-19 DIAGNOSIS — C787 Secondary malignant neoplasm of liver and intrahepatic bile duct: Secondary | ICD-10-CM

## 2019-08-20 ENCOUNTER — Other Ambulatory Visit: Payer: Self-pay | Admitting: Hematology

## 2019-08-20 DIAGNOSIS — C801 Malignant (primary) neoplasm, unspecified: Secondary | ICD-10-CM

## 2019-08-20 DIAGNOSIS — Z7189 Other specified counseling: Secondary | ICD-10-CM

## 2019-08-20 DIAGNOSIS — C787 Secondary malignant neoplasm of liver and intrahepatic bile duct: Secondary | ICD-10-CM

## 2019-08-22 ENCOUNTER — Encounter: Payer: Self-pay | Admitting: Hematology

## 2019-08-22 ENCOUNTER — Other Ambulatory Visit: Payer: Self-pay | Admitting: Hematology

## 2019-08-22 DIAGNOSIS — C787 Secondary malignant neoplasm of liver and intrahepatic bile duct: Secondary | ICD-10-CM

## 2019-08-22 DIAGNOSIS — C801 Malignant (primary) neoplasm, unspecified: Secondary | ICD-10-CM

## 2019-08-22 DIAGNOSIS — Z7189 Other specified counseling: Secondary | ICD-10-CM

## 2019-08-22 MED ORDER — LORAZEPAM 0.5 MG PO TABS: 0.5000 mg | ORAL_TABLET | Freq: Four times a day (QID) | ORAL | 0 refills | Status: DC | PRN

## 2019-08-22 MED ORDER — OXYCODONE HCL 5 MG PO TABS: 5.0000 mg | ORAL_TABLET | ORAL | 0 refills | Status: DC | PRN

## 2019-08-25 NOTE — Progress Notes (Signed)
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  HISTORY OF PRESENTING ILLNESS:   [name] is a wonderful 58 y.o. male who has been referred to [place] by Dr. Francine Graven for evaluation and management of Liver masses. He is accompanied today by his wife and aunt. The pt reports that he is doing well overall.   The pt presented to the ED on 10/01/18 regarding persistent RUQ abdominal pain which began a month prior, and noted associations with nausea and 40 pound weight loss. He was evaluated with a CT A/P, as noted below, which revealed liver masses.   The pt reports that he first began feeling RUQ abdominal pain about 3-4 weeks ago, but began losing weight about 2 months ago. He notes that he began feeling more fatigue about 2 months ago as well.  The pt notes that the color of his bowels became dark black and denies taking Iron pills or bismuth medications. He notes that he began feeling constipated as well, feeling backed up. He notes that his stools began looking thinner in caliber. The pt notes that his appetite began decreasing over the last 3-4 weeks ago, and some of this he attributes to intermittent nausea. He denies problems swallowing food. He endorses RUQ pain, and denies vomiting and new cough, new bone pains, testicular pain or swelling, problems passing urine, blood in the urine. The pt has never had a colonoscopy and does not have a PCP, and cites his lack of health insurance as the reason.  The pt was released from the ED with Oxycodone and has been using this to treat his RUQ pain successfully.   The pt notes gout and a heart murmur as previous medical history. He notes that he hasn't consumed alcohol in the past 2 years whatsoever, but prior to this alcohol was a problem for  him.   Of note prior to the patient's visit today, pt has had a CT A/P completed on 10/01/18 with results revealing Interval development of numerous ill-defined hypodense masses throughout the entire liver compatible with metastatic disease. There is a 2.5 cm enhancing mass over the upper pole cortex of the right kidney suspicious for renal cell carcinoma. 2.  No acute findings in the abdomen/pelvis. 3. 1 cm hypodensity over the pancreatic tail not well seen on previous noncontrast exams. MR may be helpful for further evaluation on an elective basis.   Most recent lab results (10/06/18) of CBC w/diff and CMP is as follows: all values are WNL except for Monocytes abs at 1.2k, Sodium at 134, Albumin at 3.0, AST at 77, ALT at 63, Alk Phos at 392, Total Bilirubin at 2.3   On review of systems, pt reports new constipation, smaller caliber stools, dark stools, unexpected weight loss, RUQ pain, new fatigue, and denies vomiting, cough, new SOB, changes in breathing, new bone pains, leg swelling, blood in the urine, changes in urination, fevers, pain along the spine, and any other symptoms.   On PMHx the pt reports Gout, heart murmur, previous alcohol abuse. He denies any surgeries.  On Social Hx the pt reports that he hasn't consumed alcohol in the past 2 years whatsoever. He notes that alcohol abuse was previously a concern. He denies ever smoking cigarettes or doing drugs. He denies any radiation or chemical exposure.  On Family Hx  the pt reports mother with pancreatic cancer in 47s, maternal aunts with high blood pressure and heart failure, maternal aunt with breast cancer.   INTERVAL HISTORY:  [name] returns today for management, evaluation, after C6 treatment of his recently diagnosed High grade neuroendocrine carcinoma.The patient's last visit with [place] was on 07/29/2019. The pt reports that he is doing well overall.  The pt reports he has been staying active by walking. He states that he has  days where he has some stomach pain but he feels better than he did previously.  He is taking the oxycodone for pain as needed      Lab results today (08/26/19) of CBC w/diff and CMP is as follows: all values are WNL except for Hemoglobin at 11.9, HCT at 36.7, Platelets 104 Magnesium at 1.6, Glucose Bld at 116, Total Protein at 8.8, Albumin at 3.3, Alkaline Phosphatase at 130.  On review of systems, pt reports some back pain, occasional abdominal pain, appetite has been the same, and denies leg/ankle swelling, headaches, diarrhea, difficulty sleeping, shoulder pain, rib pain,   and any other symptoms.   MEDICAL HISTORY:  Past Medical History:  Diagnosis Date   Arthritis    "knees; left hand"   Cancer ([place])    Gout    Heart murmur    Longstanding. Mild aortic valve thickening with trivial AR by echo 02/2012   Hypertension    Near syncope    05/2011 with tachypalpitations & with echo showing mild LVH, normal EF 55-60%, trivial AR    SURGICAL HISTORY: Past Surgical History:  Procedure Laterality Date   IR IMAGING GUIDED PORT INSERTION  10/26/2018   NO PAST SURGERIES      SOCIAL HISTORY: Social History   Socioeconomic History   Marital status: Married    Spouse name: Not on file   Number of children: 1   Years of education: Not on file   Highest education level: Not on file  Occupational History   Occupation: Unemployed  Scientist, product/process development strain: Not on file   Food insecurity    Worry: Not on file    Inability: Not on file   Transportation needs    Medical: Not on file    Non-medical: Not on file  Tobacco Use   Smoking status: Never Smoker   Smokeless tobacco: Never Used  Substance and Sexual Activity   Alcohol use: Not Currently    Comment: states he has cut back/ every now and again   Drug use: No   Sexual activity: Yes    Partners: Female  Lifestyle   Physical activity    Days per week: Not on file    Minutes per session:  Not on file   Stress: Not on file  Relationships   Social connections    Talks on phone: Not on file    Gets together: Not on file    Attends religious service: Not on file    Active member of club or organization: Not on file    Attends meetings of clubs or organizations: Not on file    Relationship status: Not on file   Intimate partner violence    Fear of current or ex partner: Not on file    Emotionally abused: Not on file    Physically abused: Not on file    Forced sexual activity: Not on file  Other Topics Concern   Not on file  Social History Narrative   Married. 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 Is from [place].     FAMILY HISTORY: Family History  Problem Relation Age of Onset   Cancer Mother        Possibly pancreatic. Died at age 101   Prostate cancer Father    Breast cancer Cousin    Diabetes Paternal Uncle    Heart disease Paternal Uncle     ALLERGIES:  has No Known Allergies.  MEDICATIONS:  Current Outpatient Medications  Medication Sig Dispense Refill   allopurinol (ZYLOPRIM) 100 MG tablet Take 2 tablets (200 mg total) by mouth daily. (Patient not taking: Reported on 05/10/2019) 60 tablet 1   clindamycin (CLEOCIN) 300 MG capsule Take 1 capsule (300 mg total) by mouth 4 (four) times daily. X 7 days (Patient not taking: Reported on 05/10/2019) 28 capsule 0   colchicine 0.6 MG tablet Take 1 tablet (0.6 mg total) by mouth See admin instructions. Take 1.65m (2 tablets) followed by .649m(1 tablet) 1 hour later. (Patient not taking: Reported on 05/10/2019) 3 tablet 0   dexamethasone (DECADRON) 4 MG tablet 64m28mo daily with breakfast for 2 weeks then every other day for 2 weeks (Patient not taking: Reported on 05/10/2019) 30 tablet 0   furosemide (LASIX) 20 MG tablet Take 1 tablet (20 mg total) by mouth daily. (Patient not taking: Reported on 05/10/2019) 30 tablet 1   lidocaine-prilocaine (EMLA) cream Apply to  affected area once (Patient not taking: Reported on 05/10/2019) 30 g 3   LORazepam (ATIVAN) 0.5 MG tablet Take 1 tablet (0.5 mg total) by mouth every 6 (six) hours as needed (Nausea or vomiting). 30 tablet 0   ondansetron (ZOFRAN ODT) 4 MG disintegrating tablet Take 1 tablet (4 mg total) by mouth every 8 (eight) hours as needed for nausea or vomiting. (Patient not taking: Reported on 05/10/2019) 6 tablet 0   oxyCODONE (OXY IR/ROXICODONE) 5 MG immediate release tablet Take 1-2 tablets (5-10 mg total) by mouth every 4 (four) hours as needed for severe pain. 120 tablet 0   potassium chloride SA (K-DUR,KLOR-CON) 20 MEQ tablet Take 1 tablet (20 mEq total) by mouth daily. With lasix (Patient not taking: Reported on 06/20/2019) 30 tablet 1   prochlorperazine (COMPAZINE) 10 MG tablet Take 1 tablet (10 mg total) by mouth every 6 (six) hours as needed (Nausea or vomiting). (Patient not taking: Reported on 05/10/2019) 30 tablet 1   No current facility-administered medications for this visit.    Facility-Administered Medications Ordered in Other Visits  Medication Dose Route Frequency Provider Last Rate Last Dose   denosumab (XGEVA) injection 120 mg  120 mg Subcutaneous Once KalIrene LimboauCloria SpringD        REVIEW OF SYSTEMS:   A 10+ POINT REVIEW OF SYSTEMS WAS OBTAINED including neurology, dermatology, psychiatry, cardiac, respiratory, lymph, extremities, GI, GU, Musculoskeletal, constitutional, breasts, reproductive, HEENT.  All pertinent positives are noted in the HPI.  All others are negative. ]  PHYSICAL EXAMINATION: ECOG FS:2 - Symptomatic, <50% confined to bed   There were no vitals filed for this visit. Wt Readings from Last 3 Encounters:  07/29/19 165 lb 9.6 oz (75.1 kg)  06/20/19 174 lb 2.6 oz (79 kg)  05/23/19 173 lb 14.4 oz (78.9 kg)   There is no height or weight on file to calculate BMI.    GENERAL:alert, in no acute distress and comfortable SKIN: no acute rashes, no significant  lesions EYES: conjunctiva are pink and non-injected, sclera anicteric OROPHARYNX: MMM,  no exudates, no oropharyngeal erythema or ulceration NECK: supple, no JVD LYMPH:  no palpable lymphadenopathy in the cervical, axillary or inguinal regions LUNGS: clear to auscultation b/l with normal respiratory effort HEART: regular rate & rhythm ABDOMEN:  normoactive bowel sounds , non tender, not distended. Liver enlarged  Extremity: no pedal edema PSYCH: alert & oriented x 3 with fluent speech NEURO: no focal motor/sensory deficits      LABORATORY DATA:  I have reviewed the data as listed  . CBC Latest Ref Rng & Units 07/27/2019 06/20/2019 06/19/2019  WBC 4.0 - 10.5 K/uL 5.0 16.0(H) 11.1(H)  Hemoglobin 13.0 - 17.0 g/dL 13.2 12.5(L) 13.2  Hematocrit 39.0 - 52.0 % 41.4 40.2 43.1  Platelets 150 - 400 K/uL 116(L) 121(L) 135(L)   CMP Latest Ref Rng & Units 07/27/2019 06/20/2019 06/19/2019  Glucose 70 - 99 mg/dL 101(H) 161(H) 179(H)  BUN 6 - 20 mg/dL 11 24(H) 17  Creatinine 0.61 - 1.24 mg/dL 1.11 1.18 1.09  Sodium 135 - 145 mmol/L 138 133(L) 136  Potassium 3.5 - 5.1 mmol/L 4.3 4.4 3.4(L)  Chloride 98 - 111 mmol/L 101 101 100  CO2 22 - 32 mmol/L '26 25 22  ' Calcium 8.9 - 10.3 mg/dL 9.9 9.3 10.3  Total Protein 6.5 - 8.1 g/dL 9.7(H) 8.9(H) 9.3(H)  Total Bilirubin 0.3 - 1.2 mg/dL 0.8 1.3(H) 1.0  Alkaline Phos 38 - 126 U/L 163(H) 116 150(H)  AST 15 - 41 U/L 22 31 35  ALT 0 - 44 U/L 16 28 32   Lab Results  Component Value Date   LDH 163 05/23/2019    03/31/2019 Mismatch Repair Protein Report   10/08/18 Liver Biopsy:    RADIOGRAPHIC STUDIES: I have personally reviewed the radiological images as listed and agreed with the findings in the report. Ct Chest W Contrast  Result Date: 07/27/2019 CLINICAL DATA:  Metastatic neuroendocrine carcinoma, unclear primary, evaluate for progression EXAM: CT CHEST, ABDOMEN, AND PELVIS WITH CONTRAST TECHNIQUE: Multidetector CT imaging of the chest, abdomen and  pelvis was performed following the standard protocol during bolus administration of intravenous contrast. CONTRAST:  154m OMNIPAQUE IOHEXOL 300 MG/ML SOLN, additional oral enteric contrast COMPARISON:  06/19/2019, 03/15/2019 FINDINGS: CT CHEST FINDINGS Cardiovascular: Right chest port catheter. Normal heart size. No pericardial effusion. Mediastinum/Nodes: No enlarged mediastinal, hilar, or axillary lymph nodes. Thyroid gland, trachea, and esophagus demonstrate no significant findings. Lungs/Pleura: Bandlike scarring of the dependent bilateral lung bases. No pleural effusion or pneumothorax. Musculoskeletal: No chest wall mass. CT ABDOMEN PELVIS FINDINGS Hepatobiliary: Redemonstrated hepatomegaly with numerous bulky hypodense metastatic lesions, generally ill-defined and not significantly changed in size compared to prior examination, a partially exophytic index lesion of the anterior right lobe of the liver measuring 3.3 cm, previously 3.3 cm (series 2, image 58), an index lesion of the lateral right lobe of the liver measuring 5.6 cm, previously 5.6 cm (series 2, image 56), an index lesion of the anterior left lobe of the liver measuring 4.3 cm, previously 4.5 cm (series 2, image 50). No gallstones, gallbladder wall thickening, or biliary dilatation. Pancreas: Unremarkable. No pancreatic ductal dilatation or surrounding inflammatory changes. Spleen: Normal in size without significant abnormality. Adrenals/Urinary Tract: Adrenal glands are unremarkable. There is a redemonstrated, exophytic small mass of the superior pole of the right kidney, best appreciated on coronal series measuring approximately 1.9 x 1.4 cm (series 4, image 87). Kidneys are otherwise normal, without renal calculi, or hydronephrosis. Bladder is unremarkable. Stomach/Bowel: Stomach is within normal limits. Appendix appears normal. No  evidence of bowel wall thickening, distention, or inflammatory changes. Large burden of stool and stool balls in  the colon and rectum. Vascular/Lymphatic: Aortic atherosclerosis. No enlarged abdominal or pelvic lymph nodes. Reproductive: No mass or other abnormality. Other: No abdominal wall hernia or abnormality. No abdominopelvic ascites. Musculoskeletal: Unchanged sclerotic osseous lesions, including of the T12 vertebral body, L4 vertebral body and bilateral proximal femurs (series 4, image 94, 85, 67, 70). IMPRESSION: 1. Redemonstrated hepatomegaly with numerous bulky hypodense metastatic lesions, generally ill-defined and not significantly changed in size compared to prior examination, a partially exophytic index lesion of the anterior right lobe of the liver measuring 3.3 cm, previously 3.3 cm (series 2, image 58), an index lesion of the lateral right lobe of the liver measuring 5.6 cm, previously 5.6 cm (series 2, image 56), an index lesion of the anterior left lobe of the liver measuring 4.3 cm, previously 4.5 cm (series 2, image 50). 2. Unchanged sclerotic osseous lesions, including of the T12 vertebral body, L4 vertebral body and bilateral proximal femurs (series 4, image 94, 85, 67, 70). 3. Findings are consistent with unchanged metastatic disease without new evidence of metastasis in the chest, abdomen, or pelvis. 4. There is a redemonstrated, exophytic small mass of the superior pole of the right kidney, best appreciated on coronal series measuring approximately 1.9 x 1.4 cm (series 4, image 87), not significantly changed compared to prior examinations and remain concerning for a small incidental renal cell carcinoma. 5. Aortic Atherosclerosis (ICD10-I70.0) and Emphysema (ICD10-J43.9). Electronically Signed   By: Eddie Candle M.D.   On: 07/27/2019 13:58   Nm Bone Scan Whole Body  Result Date: 07/28/2019 CLINICAL DATA:  Metastatic spine tumor, metastatic small cell carcinoma of unknown primary with hepatic and bone metastases EXAM: NUCLEAR MEDICINE WHOLE BODY BONE SCAN TECHNIQUE: Whole body anterior and  posterior images were obtained approximately 3 hours after intravenous injection of radiopharmaceutical. RADIOPHARMACEUTICALS:  21.2 mCi Technetium-54mMDP IV COMPARISON:  None Correlation: PET-CT 10/15/2018, CT chest abdomen pelvis 07/27/2019 FINDINGS: Focal increased tracer accumulation again identified at the T11 vertebral body consistent with osseous metastasis. Additional foci of abnormal increased tracer accumulation identified at manubrium, LEFT scapula, RIGHT humeral diaphysis, proximal LEFT humerus, RIGHT SI joint, and BILATERAL femoral diaphyses. Multiple additional sites of osseous metastatic disease identified on prior PET CT and CT exams are not scintigraphically evident, including multiple sites in the thoracic and lumbar spine. Expected urinary tract and soft tissue distribution of tracer. IMPRESSION: Scattered osseous metastases as above, including proximal LEFT humerus, RIGHT humeral diaphysis, and BILATERAL femoral diaphyses. Electronically Signed   By: MLavonia DanaM.D.   On: 07/28/2019 09:16   Ct Abdomen Pelvis W Contrast  Result Date: 07/27/2019 CLINICAL DATA:  Metastatic neuroendocrine carcinoma, unclear primary, evaluate for progression EXAM: CT CHEST, ABDOMEN, AND PELVIS WITH CONTRAST TECHNIQUE: Multidetector CT imaging of the chest, abdomen and pelvis was performed following the standard protocol during bolus administration of intravenous contrast. CONTRAST:  1082mOMNIPAQUE IOHEXOL 300 MG/ML SOLN, additional oral enteric contrast COMPARISON:  06/19/2019, 03/15/2019 FINDINGS: CT CHEST FINDINGS Cardiovascular: Right chest port catheter. Normal heart size. No pericardial effusion. Mediastinum/Nodes: No enlarged mediastinal, hilar, or axillary lymph nodes. Thyroid gland, trachea, and esophagus demonstrate no significant findings. Lungs/Pleura: Bandlike scarring of the dependent bilateral lung bases. No pleural effusion or pneumothorax. Musculoskeletal: No chest wall mass. CT ABDOMEN PELVIS  FINDINGS Hepatobiliary: Redemonstrated hepatomegaly with numerous bulky hypodense metastatic lesions, generally ill-defined and not significantly changed in size compared to prior examination,  a partially exophytic index lesion of the anterior right lobe of the liver measuring 3.3 cm, previously 3.3 cm (series 2, image 58), an index lesion of the lateral right lobe of the liver measuring 5.6 cm, previously 5.6 cm (series 2, image 56), an index lesion of the anterior left lobe of the liver measuring 4.3 cm, previously 4.5 cm (series 2, image 50). No gallstones, gallbladder wall thickening, or biliary dilatation. Pancreas: Unremarkable. No pancreatic ductal dilatation or surrounding inflammatory changes. Spleen: Normal in size without significant abnormality. Adrenals/Urinary Tract: Adrenal glands are unremarkable. There is a redemonstrated, exophytic small mass of the superior pole of the right kidney, best appreciated on coronal series measuring approximately 1.9 x 1.4 cm (series 4, image 87). Kidneys are otherwise normal, without renal calculi, or hydronephrosis. Bladder is unremarkable. Stomach/Bowel: Stomach is within normal limits. Appendix appears normal. No evidence of bowel wall thickening, distention, or inflammatory changes. Large burden of stool and stool balls in the colon and rectum. Vascular/Lymphatic: Aortic atherosclerosis. No enlarged abdominal or pelvic lymph nodes. Reproductive: No mass or other abnormality. Other: No abdominal wall hernia or abnormality. No abdominopelvic ascites. Musculoskeletal: Unchanged sclerotic osseous lesions, including of the T12 vertebral body, L4 vertebral body and bilateral proximal femurs (series 4, image 94, 85, 67, 70). IMPRESSION: 1. Redemonstrated hepatomegaly with numerous bulky hypodense metastatic lesions, generally ill-defined and not significantly changed in size compared to prior examination, a partially exophytic index lesion of the anterior right lobe of  the liver measuring 3.3 cm, previously 3.3 cm (series 2, image 58), an index lesion of the lateral right lobe of the liver measuring 5.6 cm, previously 5.6 cm (series 2, image 56), an index lesion of the anterior left lobe of the liver measuring 4.3 cm, previously 4.5 cm (series 2, image 50). 2. Unchanged sclerotic osseous lesions, including of the T12 vertebral body, L4 vertebral body and bilateral proximal femurs (series 4, image 94, 85, 67, 70). 3. Findings are consistent with unchanged metastatic disease without new evidence of metastasis in the chest, abdomen, or pelvis. 4. There is a redemonstrated, exophytic small mass of the superior pole of the right kidney, best appreciated on coronal series measuring approximately 1.9 x 1.4 cm (series 4, image 87), not significantly changed compared to prior examinations and remain concerning for a small incidental renal cell carcinoma. 5. Aortic Atherosclerosis (ICD10-I70.0) and Emphysema (ICD10-J43.9). Electronically Signed   By: Eddie Candle M.D.   On: 07/27/2019 13:58    ASSESSMENT & PLAN:  58 y.o. male with  1. 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 There is a 2.5 cm enhancing mass over the upper pole cortex of the right kidney suspicious for renal cell carcinoma. 2.  No acute findings in the abdomen/pelvis. 3. 1 cm hypodensity over the pancreatic tail not well seen on previous noncontrast exams. MR may be helpful for further evaluation on an elective basis.    10/06/18 Hep B and Hep C were negative   10/06/18 CA19-9 elevated at 652, AFP slightly elevated at 8.8, CEA normal at 2.13, LDH slightly elevated at 215  10/08/18 US Liver Biopsy which revealed High Grade Neuroendocrine Carcinoma  10/15/18 PET/CT revealed Diffuse hepatic and osseous metastatic disease without  obvious primary neoplasm. 2. Upper pole right renal lesion is not definitely hypermetabolic and has been present since 2012. I think is unlikely the cause of the  metastatic disease. Recommend liver biopsy for tissue diagnosis. If needed, an MRI of the abdomen without and with contrast may be helpful for further evaluation of the renal lesion. 3. Two sub 3 mm right upper lobe pulmonary nodules are indeterminate.   10/26/18 MRI Brain revealed No acute abnormality and negative for metastatic disease.  03/15/19 CT C/A/P which revealed "Significant improvement in multifocal liver metastasis. 2. Interval development of multifocal areas of sclerosis within the visualized axial and proximal appendicular skeleton. These are favored to represent areas of healing bone metastases. 3. Stable lesion within distal tail of pancreas. 4. Aortic Atherosclerosis."   06/19/2019 CT abdomen and pelvis with results revealing "Widespread hepatic metastasis. Individual lesions are difficult to measure however the overall size of the liver is increased significantly and therefore findings are consistent with progression of hepatic metastasis compared to CT of 03/15/2019). No bowel obstruction. No abdominopelvic lymphadenopathy. Stable enhancing small RIGHT renal mass. Stable sclerotic skeletal metastasis."  07/27/2019 CT of the chest abdomen and pelvis with results revealing "Redemonstrated hepatomegaly with numerous bulky hypodense metastatic lesions, generally ill-defined and not significantly changed in size compared to prior examination, a partially exophytic index lesion of the anterior right lobe of the liver measuring 3.3 cm, previously 3.3 cm (series 2, image 58), an index lesion of the lateral right lobe of the liver measuring 5.6 cm, previously 5.6 cm (series 2, image 56), an index lesion of the anterior left lobe of the liver measuring 4.3 cm, previously 4.5 cm (series 2, image 50). Unchanged sclerotic osseous lesions,  including of the T12 vertebral body, L4 vertebral body and bilateral proximal femurs (series 4, image 94, 85, 67, 70). Findings are consistent with unchanged metastatic disease without new evidence of metastasis in the chest, abdomen, or pelvis. There is a redemonstrated, exophytic small mass of the superior pole of the right kidney, best appreciated on coronal series measuring approximately 1.9 x 1.4 cm (series 4, image 87), not significantly changed compared to prior examinations and remain concerning for a small incidental renal cell carcinoma. Aortic Atherosclerosis (ICD10-I70.0) and Emphysema (ICD10-J43.9)."    S/p 6 cycles of Carboplatin and 20% dose reduced Etoposide completed on 02/19/19. Dose reduced as pt developed significant thrombocytopenia after C1.  2. Abnormal LFTs - likely from liver metastases  Labs upon initial presentation from 10/06/18, some borderline monocytosis at 1.2k, otherwise normal blood counts, AST at 77, ALT at 63, Alk Phos at 392, Total Bilirubin at 2.3   3. Rt renal mass -  seen on 10/15/18 PET/CT but no longer observed on 03/15/19 CT C/A/P  4.Scattered osseous metastases-  07/27/2019 whole body bone scan with results revealing Scattered osseous metastases  as above, including proximal LEFT humerus, RIGHT humeral diaphysis, and BILATERAL femoral diaphyses.    PLAN: -Discussed pt labwork today, 08/26/19; CBC w/diff and CMP is as follows: all values are WNL except for Hemoglobin at 11.9, HCT at 36.7, Platelets 104 Magnesium at 1.6, Glucose Bld at 116, Total Protein at 8.8, Albumin at 3.3, Alkaline Phosphatase at 130 -Discussed supplements, pt states he is using Ensure -Advised pt that he does not have to take calcium he can take Vitamin D  -Recommended eating more fruits -Discussed hemoglobin slightly low  -Discussed that his magnesium is low,recommended eating more nuts and dry fruits -Discussed that protein levels have decreased a little, advised to continue taking  supplements -Discussed constipation and advised to take a senna-sr. Will call in a prescription. Take two pills at night time. -Discussed previous CT  scan discussed doing a repeat scan before next appointment that will be scheduled in a month.       FOLLOW UP: CT chest/abd/pelvis in 4 weeks RTC with Dr Irene Limbo in 5 weeks with labs  The total time spent in the appt was 25 minutes and more than 50% was on counseling and direct patient cares.  All of the patient's questions were answered with apparent satisfaction. The patient knows to call the clinic with any problems, questions or concerns.     Sullivan Lone MD MS AAHIVMS [facility] Hematology/Oncology Physician [facility]  (Office):       [phone] (Work cell):  [phone] (Fax):           [phone]  08/25/2019 11:50 PM  I, Scot Dock, am acting as a scribe for Dr. Sullivan Lone.   .I have reviewed the above documentation for accuracy and completeness, and I agree with the above. Brunetta Genera MD

## 2019-08-26 ENCOUNTER — Inpatient Hospital Stay: Payer: Medicaid Other | Attending: Hematology

## 2019-08-26 ENCOUNTER — Inpatient Hospital Stay (HOSPITAL_BASED_OUTPATIENT_CLINIC_OR_DEPARTMENT_OTHER): Payer: Medicaid Other | Admitting: Hematology

## 2019-08-26 ENCOUNTER — Inpatient Hospital Stay: Payer: Medicaid Other

## 2019-08-26 ENCOUNTER — Other Ambulatory Visit: Payer: Self-pay

## 2019-08-26 VITALS — BP 142/72 | HR 66 | Temp 98.9°F | Resp 17 | Ht 67.0 in | Wt 163.7 lb

## 2019-08-26 DIAGNOSIS — C787 Secondary malignant neoplasm of liver and intrahepatic bile duct: Secondary | ICD-10-CM

## 2019-08-26 DIAGNOSIS — C7951 Secondary malignant neoplasm of bone: Secondary | ICD-10-CM | POA: Diagnosis not present

## 2019-08-26 DIAGNOSIS — C801 Malignant (primary) neoplasm, unspecified: Secondary | ICD-10-CM | POA: Diagnosis not present

## 2019-08-26 DIAGNOSIS — Z95828 Presence of other vascular implants and grafts: Secondary | ICD-10-CM

## 2019-08-26 DIAGNOSIS — Z7189 Other specified counseling: Secondary | ICD-10-CM

## 2019-08-26 DIAGNOSIS — C7B03 Secondary carcinoid tumors of bone: Secondary | ICD-10-CM | POA: Insufficient documentation

## 2019-08-26 DIAGNOSIS — C7B02 Secondary carcinoid tumors of liver: Secondary | ICD-10-CM | POA: Diagnosis not present

## 2019-08-26 DIAGNOSIS — C7A1 Malignant poorly differentiated neuroendocrine tumors: Secondary | ICD-10-CM | POA: Insufficient documentation

## 2019-08-26 LAB — CMP (CANCER CENTER ONLY)
ALT: 15 U/L (ref 0–44)
AST: 21 U/L (ref 15–41)
Albumin: 3.3 g/dL — ABNORMAL LOW (ref 3.5–5.0)
Alkaline Phosphatase: 130 U/L — ABNORMAL HIGH (ref 38–126)
Anion gap: 11 (ref 5–15)
BUN: 11 mg/dL (ref 6–20)
CO2: 23 mmol/L (ref 22–32)
Calcium: 9 mg/dL (ref 8.9–10.3)
Chloride: 104 mmol/L (ref 98–111)
Creatinine: 0.96 mg/dL (ref 0.61–1.24)
GFR, Est AFR Am: >60 mL/min (ref >60)
GFR, Estimated: >60 mL/min (ref >60)
Glucose, Bld: 116 mg/dL — ABNORMAL HIGH (ref 70–99)
Potassium: 4 mmol/L (ref 3.5–5.1)
Sodium: 138 mmol/L (ref 135–145)
Total Bilirubin: 0.7 mg/dL (ref 0.3–1.2)
Total Protein: 8.8 g/dL — ABNORMAL HIGH (ref 6.5–8.1)

## 2019-08-26 LAB — CBC WITH DIFFERENTIAL/PLATELET
Abs Immature Granulocytes: 0.01 10*3/uL (ref 0.00–0.07)
Basophils Absolute: 0 10*3/uL (ref 0.0–0.1)
Basophils Relative: 1 %
Eosinophils Absolute: 0.1 10*3/uL (ref 0.0–0.5)
Eosinophils Relative: 3 %
HCT: 36.7 % — ABNORMAL LOW (ref 39.0–52.0)
Hemoglobin: 11.9 g/dL — ABNORMAL LOW (ref 13.0–17.0)
Immature Granulocytes: 0 %
Lymphocytes Relative: 23 %
Lymphs Abs: 1.1 10*3/uL (ref 0.7–4.0)
MCH: 27.2 pg (ref 26.0–34.0)
MCHC: 32.4 g/dL (ref 30.0–36.0)
MCV: 84 fL (ref 80.0–100.0)
Monocytes Absolute: 0.7 10*3/uL (ref 0.1–1.0)
Monocytes Relative: 14 %
Neutro Abs: 2.8 10*3/uL (ref 1.7–7.7)
Neutrophils Relative %: 59 %
Platelets: 104 10*3/uL — ABNORMAL LOW (ref 150–400)
RBC: 4.37 MIL/uL (ref 4.22–5.81)
RDW: 14 % (ref 11.5–15.5)
WBC: 4.8 10*3/uL (ref 4.0–10.5)
nRBC: 0 % (ref 0.0–0.2)

## 2019-08-26 LAB — MAGNESIUM: Magnesium: 1.6 mg/dL — ABNORMAL LOW (ref 1.7–2.4)

## 2019-08-26 MED ORDER — DENOSUMAB 120 MG/1.7ML ~~LOC~~ SOLN
120.0000 mg | Freq: Once | SUBCUTANEOUS | Status: AC
  Administered 2019-08-26: 120 mg via SUBCUTANEOUS

## 2019-08-26 MED ORDER — DENOSUMAB 120 MG/1.7ML ~~LOC~~ SOLN
SUBCUTANEOUS | Status: AC
  Filled 2019-08-26: qty 1.7

## 2019-08-26 NOTE — Patient Instructions (Signed)
Denosumab injection What is this medicine? DENOSUMAB (den oh sue mab) slows bone breakdown. Prolia is used to treat osteoporosis in women after menopause and in men, and in people who are taking corticosteroids for 6 months or more. Xgeva is used to treat a high calcium level due to cancer and to prevent bone fractures and other bone problems caused by multiple myeloma or cancer bone metastases. Xgeva is also used to treat giant cell tumor of the bone. This medicine may be used for other purposes; ask your health care provider or pharmacist if you have questions. COMMON BRAND NAME(S): Prolia, XGEVA What should I tell my health care provider before I take this medicine? They need to know if you have any of these conditions:  dental disease  having surgery or tooth extraction  infection  kidney disease  low levels of calcium or Vitamin D in the blood  malnutrition  on hemodialysis  skin conditions or sensitivity  thyroid or parathyroid disease  an unusual reaction to denosumab, other medicines, foods, dyes, or preservatives  pregnant or trying to get pregnant  breast-feeding How should I use this medicine? This medicine is for injection under the skin. It is given by a health care professional in a hospital or clinic setting. A special MedGuide will be given to you before each treatment. Be sure to read this information carefully each time. For Prolia, talk to your pediatrician regarding the use of this medicine in children. Special care may be needed. For Xgeva, talk to your pediatrician regarding the use of this medicine in children. While this drug may be prescribed for children as young as 13 years for selected conditions, precautions do apply. Overdosage: If you think you have taken too much of this medicine contact a poison control center or emergency room at once. NOTE: This medicine is only for you. Do not share this medicine with others. What if I miss a dose? It is  important not to miss your dose. Call your doctor or health care professional if you are unable to keep an appointment. What may interact with this medicine? Do not take this medicine with any of the following medications:  other medicines containing denosumab This medicine may also interact with the following medications:  medicines that lower your chance of fighting infection  steroid medicines like prednisone or cortisone This list may not describe all possible interactions. Give your health care provider a list of all the medicines, herbs, non-prescription drugs, or dietary supplements you use. Also tell them if you smoke, drink alcohol, or use illegal drugs. Some items may interact with your medicine. What should I watch for while using this medicine? Visit your doctor or health care professional for regular checks on your progress. Your doctor or health care professional may order blood tests and other tests to see how you are doing. Call your doctor or health care professional for advice if you get a fever, chills or sore throat, or other symptoms of a cold or flu. Do not treat yourself. This drug may decrease your body's ability to fight infection. Try to avoid being around people who are sick. You should make sure you get enough calcium and vitamin D while you are taking this medicine, unless your doctor tells you not to. Discuss the foods you eat and the vitamins you take with your health care professional. See your dentist regularly. Brush and floss your teeth as directed. Before you have any dental work done, tell your dentist you are   receiving this medicine. Do not become pregnant while taking this medicine or for 5 months after stopping it. Talk with your doctor or health care professional about your birth control options while taking this medicine. Women should inform their doctor if they wish to become pregnant or think they might be pregnant. There is a potential for serious side  effects to an unborn child. Talk to your health care professional or pharmacist for more information. What side effects may I notice from receiving this medicine? Side effects that you should report to your doctor or health care professional as soon as possible:  allergic reactions like skin rash, itching or hives, swelling of the face, lips, or tongue  bone pain  breathing problems  dizziness  jaw pain, especially after dental work  redness, blistering, peeling of the skin  signs and symptoms of infection like fever or chills; cough; sore throat; pain or trouble passing urine  signs of low calcium like fast heartbeat, muscle cramps or muscle pain; pain, tingling, numbness in the hands or feet; seizures  unusual bleeding or bruising  unusually weak or tired Side effects that usually do not require medical attention (report to your doctor or health care professional if they continue or are bothersome):  constipation  diarrhea  headache  joint pain  loss of appetite  muscle pain  runny nose  tiredness  upset stomach This list may not describe all possible side effects. Call your doctor for medical advice about side effects. You may report side effects to FDA at 1-800-FDA-1088. Where should I keep my medicine? This medicine is only given in a clinic, doctor's office, or other health care setting and will not be stored at home. NOTE: This sheet is a summary. It may not cover all possible information. If you have questions about this medicine, talk to your doctor, pharmacist, or health care provider.  2020 Elsevier/Gold Standard (2018-02-26 16:10:44)

## 2019-08-27 ENCOUNTER — Encounter: Payer: Self-pay | Admitting: Hematology

## 2019-08-29 ENCOUNTER — Other Ambulatory Visit: Payer: Self-pay | Admitting: *Deleted

## 2019-08-29 ENCOUNTER — Telehealth: Payer: Self-pay | Admitting: Hematology

## 2019-08-29 MED ORDER — SENNOSIDES-DOCUSATE SODIUM 8.6-50 MG PO TABS: 2.0000 | ORAL_TABLET | Freq: Every day | ORAL | 1 refills | Status: DC

## 2019-08-29 NOTE — Telephone Encounter (Signed)
Scheduled appt per 10/23 los.  Left a VM of the appt date and time.

## 2019-09-07 ENCOUNTER — Encounter: Payer: Self-pay | Admitting: Hematology

## 2019-09-07 ENCOUNTER — Other Ambulatory Visit: Payer: Self-pay | Admitting: Hematology

## 2019-09-07 DIAGNOSIS — C787 Secondary malignant neoplasm of liver and intrahepatic bile duct: Secondary | ICD-10-CM

## 2019-09-07 DIAGNOSIS — Z7189 Other specified counseling: Secondary | ICD-10-CM

## 2019-09-07 DIAGNOSIS — C801 Malignant (primary) neoplasm, unspecified: Secondary | ICD-10-CM

## 2019-09-07 MED ORDER — OXYCODONE HCL 5 MG PO TABS: 5.0000 mg | ORAL_TABLET | ORAL | 0 refills | Status: DC | PRN

## 2019-09-07 MED ORDER — LORAZEPAM 0.5 MG PO TABS: 0.5000 mg | ORAL_TABLET | Freq: Four times a day (QID) | ORAL | 0 refills | Status: DC | PRN

## 2019-09-08 NOTE — Telephone Encounter (Signed)
Contacted patient's spouse and informed that per Dr.Kale, he is not able to provide a letter for this purpose. She verbalized understanding.

## 2019-09-23 ENCOUNTER — Inpatient Hospital Stay: Payer: Medicaid Other | Attending: Hematology

## 2019-09-23 ENCOUNTER — Other Ambulatory Visit: Payer: Self-pay

## 2019-09-23 VITALS — BP 145/74 | HR 75 | Temp 98.4°F | Resp 18

## 2019-09-23 DIAGNOSIS — Z79899 Other long term (current) drug therapy: Secondary | ICD-10-CM | POA: Diagnosis not present

## 2019-09-23 DIAGNOSIS — C7951 Secondary malignant neoplasm of bone: Secondary | ICD-10-CM

## 2019-09-23 DIAGNOSIS — C7A1 Malignant poorly differentiated neuroendocrine tumors: Secondary | ICD-10-CM | POA: Diagnosis present

## 2019-09-23 DIAGNOSIS — C7B02 Secondary carcinoid tumors of liver: Secondary | ICD-10-CM | POA: Diagnosis not present

## 2019-09-23 DIAGNOSIS — Z95828 Presence of other vascular implants and grafts: Secondary | ICD-10-CM

## 2019-09-23 DIAGNOSIS — C7B03 Secondary carcinoid tumors of bone: Secondary | ICD-10-CM | POA: Insufficient documentation

## 2019-09-23 DIAGNOSIS — C801 Malignant (primary) neoplasm, unspecified: Secondary | ICD-10-CM

## 2019-09-23 DIAGNOSIS — Z7189 Other specified counseling: Secondary | ICD-10-CM

## 2019-09-23 DIAGNOSIS — C787 Secondary malignant neoplasm of liver and intrahepatic bile duct: Secondary | ICD-10-CM

## 2019-09-23 MED ORDER — DENOSUMAB 120 MG/1.7ML ~~LOC~~ SOLN
SUBCUTANEOUS | Status: AC
  Filled 2019-09-23: qty 1.7

## 2019-09-23 MED ORDER — DENOSUMAB 120 MG/1.7ML ~~LOC~~ SOLN
120.0000 mg | Freq: Once | SUBCUTANEOUS | Status: AC
  Administered 2019-09-23: 120 mg via SUBCUTANEOUS

## 2019-09-23 NOTE — Patient Instructions (Signed)
Denosumab injection What is this medicine? DENOSUMAB (den oh sue mab) slows bone breakdown. Prolia is used to treat osteoporosis in women after menopause and in men, and in people who are taking corticosteroids for 6 months or more. Xgeva is used to treat a high calcium level due to cancer and to prevent bone fractures and other bone problems caused by multiple myeloma or cancer bone metastases. Xgeva is also used to treat giant cell tumor of the bone. This medicine may be used for other purposes; ask your health care provider or pharmacist if you have questions. COMMON BRAND NAME(S): Prolia, XGEVA What should I tell my health care provider before I take this medicine? They need to know if you have any of these conditions:  dental disease  having surgery or tooth extraction  infection  kidney disease  low levels of calcium or Vitamin D in the blood  malnutrition  on hemodialysis  skin conditions or sensitivity  thyroid or parathyroid disease  an unusual reaction to denosumab, other medicines, foods, dyes, or preservatives  pregnant or trying to get pregnant  breast-feeding How should I use this medicine? This medicine is for injection under the skin. It is given by a health care professional in a hospital or clinic setting. A special MedGuide will be given to you before each treatment. Be sure to read this information carefully each time. For Prolia, talk to your pediatrician regarding the use of this medicine in children. Special care may be needed. For Xgeva, talk to your pediatrician regarding the use of this medicine in children. While this drug may be prescribed for children as young as 13 years for selected conditions, precautions do apply. Overdosage: If you think you have taken too much of this medicine contact a poison control center or emergency room at once. NOTE: This medicine is only for you. Do not share this medicine with others. What if I miss a dose? It is  important not to miss your dose. Call your doctor or health care professional if you are unable to keep an appointment. What may interact with this medicine? Do not take this medicine with any of the following medications:  other medicines containing denosumab This medicine may also interact with the following medications:  medicines that lower your chance of fighting infection  steroid medicines like prednisone or cortisone This list may not describe all possible interactions. Give your health care provider a list of all the medicines, herbs, non-prescription drugs, or dietary supplements you use. Also tell them if you smoke, drink alcohol, or use illegal drugs. Some items may interact with your medicine. What should I watch for while using this medicine? Visit your doctor or health care professional for regular checks on your progress. Your doctor or health care professional may order blood tests and other tests to see how you are doing. Call your doctor or health care professional for advice if you get a fever, chills or sore throat, or other symptoms of a cold or flu. Do not treat yourself. This drug may decrease your body's ability to fight infection. Try to avoid being around people who are sick. You should make sure you get enough calcium and vitamin D while you are taking this medicine, unless your doctor tells you not to. Discuss the foods you eat and the vitamins you take with your health care professional. See your dentist regularly. Brush and floss your teeth as directed. Before you have any dental work done, tell your dentist you are   receiving this medicine. Do not become pregnant while taking this medicine or for 5 months after stopping it. Talk with your doctor or health care professional about your birth control options while taking this medicine. Women should inform their doctor if they wish to become pregnant or think they might be pregnant. There is a potential for serious side  effects to an unborn child. Talk to your health care professional or pharmacist for more information. What side effects may I notice from receiving this medicine? Side effects that you should report to your doctor or health care professional as soon as possible:  allergic reactions like skin rash, itching or hives, swelling of the face, lips, or tongue  bone pain  breathing problems  dizziness  jaw pain, especially after dental work  redness, blistering, peeling of the skin  signs and symptoms of infection like fever or chills; cough; sore throat; pain or trouble passing urine  signs of low calcium like fast heartbeat, muscle cramps or muscle pain; pain, tingling, numbness in the hands or feet; seizures  unusual bleeding or bruising  unusually weak or tired Side effects that usually do not require medical attention (report to your doctor or health care professional if they continue or are bothersome):  constipation  diarrhea  headache  joint pain  loss of appetite  muscle pain  runny nose  tiredness  upset stomach This list may not describe all possible side effects. Call your doctor for medical advice about side effects. You may report side effects to FDA at 1-800-FDA-1088. Where should I keep my medicine? This medicine is only given in a clinic, doctor's office, or other health care setting and will not be stored at home. NOTE: This sheet is a summary. It may not cover all possible information. If you have questions about this medicine, talk to your doctor, pharmacist, or health care provider.  2020 Elsevier/Gold Standard (2018-02-26 16:10:44)

## 2019-09-26 ENCOUNTER — Other Ambulatory Visit: Payer: Self-pay | Admitting: Hematology

## 2019-09-26 ENCOUNTER — Encounter (HOSPITAL_COMMUNITY): Payer: Self-pay

## 2019-09-26 ENCOUNTER — Encounter: Payer: Self-pay | Admitting: Hematology

## 2019-09-26 ENCOUNTER — Emergency Department (HOSPITAL_COMMUNITY)
Admission: EM | Admit: 2019-09-26 | Discharge: 2019-09-26 | Disposition: A | Discharge status: Home / Self Care | Payer: Medicaid Other | Attending: Emergency Medicine | Admitting: Emergency Medicine

## 2019-09-26 ENCOUNTER — Emergency Department (HOSPITAL_COMMUNITY): Payer: Medicaid Other

## 2019-09-26 ENCOUNTER — Ambulatory Visit (HOSPITAL_COMMUNITY): Payer: Medicaid Other

## 2019-09-26 ENCOUNTER — Other Ambulatory Visit: Payer: Self-pay

## 2019-09-26 DIAGNOSIS — C801 Malignant (primary) neoplasm, unspecified: Secondary | ICD-10-CM

## 2019-09-26 DIAGNOSIS — C787 Secondary malignant neoplasm of liver and intrahepatic bile duct: Secondary | ICD-10-CM

## 2019-09-26 DIAGNOSIS — Z79899 Other long term (current) drug therapy: Secondary | ICD-10-CM | POA: Diagnosis not present

## 2019-09-26 DIAGNOSIS — I10 Essential (primary) hypertension: Secondary | ICD-10-CM | POA: Insufficient documentation

## 2019-09-26 DIAGNOSIS — R0602 Shortness of breath: Secondary | ICD-10-CM | POA: Diagnosis present

## 2019-09-26 DIAGNOSIS — Z7189 Other specified counseling: Secondary | ICD-10-CM

## 2019-09-26 LAB — HEPATIC FUNCTION PANEL
ALT: 21 U/L (ref 0–44)
AST: 34 U/L (ref 15–41)
Albumin: 3.6 g/dL (ref 3.5–5.0)
Alkaline Phosphatase: 141 U/L — ABNORMAL HIGH (ref 38–126)
Bilirubin, Direct: 0.5 mg/dL — ABNORMAL HIGH (ref 0.0–0.2)
Indirect Bilirubin: 0.6 mg/dL (ref 0.3–0.9)
Total Bilirubin: 1.1 mg/dL (ref 0.3–1.2)
Total Protein: 9.5 g/dL — ABNORMAL HIGH (ref 6.5–8.1)

## 2019-09-26 LAB — BASIC METABOLIC PANEL
Anion gap: 12 (ref 5–15)
BUN: 17 mg/dL (ref 6–20)
CO2: 21 mmol/L — ABNORMAL LOW (ref 22–32)
Calcium: 9.2 mg/dL (ref 8.9–10.3)
Chloride: 101 mmol/L (ref 98–111)
Creatinine, Ser: 0.92 mg/dL (ref 0.61–1.24)
GFR calc Af Amer: >60 mL/min (ref >60)
GFR calc non Af Amer: >60 mL/min (ref >60)
Glucose, Bld: 116 mg/dL — ABNORMAL HIGH (ref 70–99)
Potassium: 4.3 mmol/L (ref 3.5–5.1)
Sodium: 134 mmol/L — ABNORMAL LOW (ref 135–145)

## 2019-09-26 LAB — D-DIMER, QUANTITATIVE: D-Dimer, Quant: 8.8 ug/mL-FEU — ABNORMAL HIGH (ref 0.00–0.50)

## 2019-09-26 LAB — CBC
HCT: 38.8 % — ABNORMAL LOW (ref 39.0–52.0)
Hemoglobin: 12.1 g/dL — ABNORMAL LOW (ref 13.0–17.0)
MCH: 27.3 pg (ref 26.0–34.0)
MCHC: 31.2 g/dL (ref 30.0–36.0)
MCV: 87.4 fL (ref 80.0–100.0)
Platelets: 132 10*3/uL — ABNORMAL LOW (ref 150–400)
RBC: 4.44 MIL/uL (ref 4.22–5.81)
RDW: 14.1 % (ref 11.5–15.5)
WBC: 5.6 10*3/uL (ref 4.0–10.5)
nRBC: 0 % (ref 0.0–0.2)

## 2019-09-26 LAB — TROPONIN I (HIGH SENSITIVITY)
Troponin I (High Sensitivity): 2 ng/L (ref <18)
Troponin I (High Sensitivity): 3 ng/L (ref <18)

## 2019-09-26 LAB — LIPASE, BLOOD: Lipase: 20 U/L (ref 11–51)

## 2019-09-26 MED ORDER — IOHEXOL 350 MG/ML SOLN
80.0000 mL | Freq: Once | INTRAVENOUS | Status: AC | PRN
  Administered 2019-09-26: 80 mL via INTRAVENOUS

## 2019-09-26 MED ORDER — SODIUM CHLORIDE (PF) 0.9 % IJ SOLN
INTRAMUSCULAR | Status: AC
  Filled 2019-09-26: qty 50

## 2019-09-26 MED ORDER — SODIUM CHLORIDE 0.9% FLUSH: 3.0000 mL | Freq: Once | INTRAVENOUS | Status: DC

## 2019-09-26 NOTE — ED Notes (Signed)
Pt ambulatory from triage, no assistance needed.

## 2019-09-26 NOTE — Discharge Instructions (Addendum)
Your heart labs are unremarkable.  Do not believe you have any heart issues ongoing at this time.  Possibly anxiety as discussed.  Follow-up with your oncologist as discussed.

## 2019-09-26 NOTE — ED Provider Notes (Signed)
[facility] DEPT Provider Note   CSN: [id number] Arrival date & time: 09/26/19  9892     History   Chief Complaint Chief Complaint  Patient presents with   Shortness of Breath    HPI [name] is a 58 y.o. male.     The history is provided by the patient.  Shortness of Breath Severity:  Moderate Onset quality:  Sudden Progression:  Improving Chronicity:  New Context: not URI   Relieved by:  Rest Worsened by:  Nothing Associated symptoms: no abdominal pain, no chest pain, no claudication, no cough, no diaphoresis, no ear pain, no fever, no rash, no sore throat, no sputum production, no vomiting and no wheezing   Risk factors: hx of cancer (liver)   Risk factors: no hx of PE/DVT     Past Medical History:  Diagnosis Date   Arthritis    "knees; left hand"   Cancer (HCC)    Gout    Heart murmur    Longstanding. 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 Authorizing Provider  LORazepam (ATIVAN) 0.5 MG tablet Take 1 tablet (0.5 mg total) by mouth every 6 (six) hours as needed (Nausea or  vomiting). 09/07/19  Yes Brunetta Genera, MD  oxyCODONE (OXY IR/ROXICODONE) 5 MG immediate release tablet Take 1-2 tablets (5-10 mg total) by mouth every 4 (four) hours as needed for severe pain. 09/07/19  Yes Brunetta Genera, MD  senna-docusate (SENNA S) 8.6-50 MG tablet Take 2 tablets by mouth at bedtime. 08/29/19  Yes Brunetta Genera, MD  allopurinol (ZYLOPRIM) 100 MG tablet Take 2 tablets (200 mg total) by mouth daily. Patient not taking: Reported on 05/10/2019 01/26/19   Brunetta Genera, MD  clindamycin (CLEOCIN) 300 MG capsule Take 1 capsule (300 mg total) by mouth 4 (four) times daily. X 7 days Patient not taking: Reported on 05/10/2019 12/11/18   Veryl Speak, MD  colchicine 0.6 MG tablet Take 1 tablet (0.6 mg total) by mouth See admin instructions. Take 1.2mg  (2 tablets) followed by .6mg  (1 tablet) 1 hour later. Patient not taking: Reported on 05/10/2019 03/27/18   Molpus, Jenny Reichmann, MD  dexamethasone (DECADRON) 4 MG tablet 4mg  po daily with breakfast for 2 weeks then every other day for 2 weeks Patient not taking: Reported on 05/10/2019 12/24/18   Brunetta Genera, MD  furosemide (LASIX) 20 MG tablet Take 1 tablet (20 mg total) by mouth daily. Patient not taking: Reported on 05/10/2019 11/24/18   Brunetta Genera, MD  lidocaine-prilocaine (EMLA) cream Apply to affected area once Patient  not taking: Reported on 05/10/2019 10/20/18   Brunetta Genera, MD  ondansetron (ZOFRAN ODT) 4 MG disintegrating tablet Take 1 tablet (4 mg total) by mouth every 8 (eight) hours as needed for nausea or vomiting. Patient not taking: Reported on 05/10/2019 10/01/18   Francine Graven, DO  potassium chloride SA (K-DUR,KLOR-CON) 20 MEQ tablet Take 1 tablet (20 mEq total) by mouth daily. With lasix Patient not taking: Reported on 06/20/2019 11/24/18   Brunetta Genera, MD  prochlorperazine (COMPAZINE) 10 MG tablet Take 1 tablet (10 mg total) by mouth every 6 (six) hours as needed (Nausea or  vomiting). Patient not taking: Reported on 05/10/2019 10/20/18   Brunetta Genera, MD    Family History Family History  Problem Relation Age of Onset   Cancer Mother        Possibly pancreatic. Died at age 35   Prostate cancer Father    Breast cancer Cousin    Diabetes Paternal Uncle    Heart disease Paternal Uncle     Social History Social History   Tobacco Use   Smoking status: Never Smoker   Smokeless tobacco: Never Used  Substance Use Topics   Alcohol use: Not Currently    Comment: states he has cut back/ every now and again   Drug use: No     Allergies   Patient has no known allergies.   Review of Systems Review of Systems  Constitutional: Negative for chills, diaphoresis and fever.  HENT: Negative for ear pain and sore throat.   Eyes: Negative for pain and visual disturbance.  Respiratory: Positive for shortness of breath. Negative for cough, sputum production and wheezing.   Cardiovascular: Negative for chest pain, palpitations and claudication.  Gastrointestinal: Negative for abdominal pain and vomiting.  Genitourinary: Negative for dysuria and hematuria.  Musculoskeletal: Negative for arthralgias and back pain.  Skin: Negative for color change and rash.  Neurological: Negative for seizures and syncope.  Psychiatric/Behavioral: The patient is nervous/anxious.   All other systems reviewed and are negative.    Physical Exam Updated Vital Signs  ED Triage Vitals  Enc Vitals Group     BP 09/26/19 0910 (!) 162/81     Pulse Rate 09/26/19 0910 91     Resp 09/26/19 0910 16     Temp 09/26/19 0910 98.3 F (36.8 C)     Temp Source 09/26/19 0910 Oral     SpO2 09/26/19 0910 99 %     Weight 09/26/19 0923 170 lb (77.1 kg)     Height 09/26/19 0923 5\' 7"  (1.702 m)     Head Circumference --      Peak Flow --      Pain Score 09/26/19 0923 5     Pain Loc --      Pain Edu? --      Excl. in [place]? --     Physical Exam Vitals signs and nursing note  reviewed.  Constitutional:      Appearance: He is well-developed.  HENT:     Head: Normocephalic and atraumatic.     Mouth/Throat:     Mouth: Mucous membranes are moist.  Eyes:     Extraocular Movements: Extraocular movements intact.     Conjunctiva/sclera: Conjunctivae normal.     Pupils: Pupils are equal, round, and reactive to light.  Neck:     Musculoskeletal: Normal range of motion and neck supple.  Cardiovascular:     Rate and Rhythm: Normal rate and regular rhythm.  Pulses: Normal pulses.     Heart sounds: Normal heart sounds. No murmur.  Pulmonary:     Effort: Pulmonary effort is normal. No tachypnea or respiratory distress.     Breath sounds: Examination of the right-lower field reveals decreased breath sounds. Decreased breath sounds present. No wheezing or rhonchi.  Abdominal:     Palpations: Abdomen is soft.     Tenderness: There is no abdominal tenderness.  Musculoskeletal:     Right lower leg: No edema.     Left lower leg: No edema.  Skin:    General: Skin is warm and dry.  Neurological:     Mental Status: He is alert.  Psychiatric:        Mood and Affect: Mood normal.      ED Treatments / Results  Labs (all labs ordered are listed, but only abnormal results are displayed) Labs Reviewed  BASIC METABOLIC PANEL - Abnormal; Notable for the following components:      Result Value   Sodium 134 (*)    CO2 21 (*)    Glucose, Bld 116 (*)    All other components within normal limits  CBC - Abnormal; Notable for the following components:   Hemoglobin 12.1 (*)    HCT 38.8 (*)    Platelets 132 (*)    All other components within normal limits  D-DIMER, QUANTITATIVE (NOT AT [facility]) - Abnormal; Notable for the following components:   D-Dimer, Quant 8.80 (*)    All other components within normal limits  HEPATIC FUNCTION PANEL - Abnormal; Notable for the following components:   Total Protein 9.5 (*)    Alkaline Phosphatase 141 (*)    Bilirubin, Direct 0.5 (*)     All other components within normal limits  LIPASE, BLOOD  TROPONIN I (HIGH SENSITIVITY)  TROPONIN I (HIGH SENSITIVITY)    EKG EKG Interpretation  Date/Time:  Monday September 26 2019 09:26:46 EST Ventricular Rate:  90 PR Interval:    QRS Duration: 86 QT Interval:  369 QTC Calculation: 452 R Axis:   44 Text Interpretation: Sinus rhythm Ventricular premature complex Confirmed by Lennice Sites [phone]) on 09/26/2019 9:29:57 AM   Radiology Dg Chest 2 View  Result Date: 09/26/2019 CLINICAL DATA:  Chest pain and shortness of breath EXAM: CHEST - 2 VIEW COMPARISON:  May 10, 2019 FINDINGS: Right chest wall port catheter tip is unchanged in position. Stable elevation of the right hemidiaphragm. No new consolidation or edema. No pleural effusion or pneumothorax. Cardiomediastinal contours are within normal limits. IMPRESSION: No acute process in the chest. Electronically Signed   By: Macy Mis M.D.   On: 09/26/2019 10:11   Ct Angio Chest Pe W And/or Wo Contrast  Result Date: 09/26/2019 CLINICAL DATA:  Metastatic neuroendocrine carcinoma chest pain and shortness of breath EXAM: CT ANGIOGRAPHY CHEST WITH CONTRAST TECHNIQUE: Multidetector CT imaging of the chest was performed using the standard protocol during bolus administration of intravenous contrast. Multiplanar CT image reconstructions and MIPs were obtained to evaluate the vascular anatomy. CONTRAST:  76mL OMNIPAQUE IOHEXOL 350 MG/ML SOLN COMPARISON:  CT angiogram chest July 27, 2019 FINDINGS: Cardiovascular: There is no demonstrable pulmonary embolus. There is no appreciable thoracic aortic aneurysm or dissection. Visualized great vessels appear unremarkable. There is no appreciable pericardial thickening. There is a rather minimal pericardial effusion, likely within physiologic range. Port-A-Cath tip is in the superior vena cava. There are foci of coronary artery calcification. Mediastinum/Nodes: Thyroid appears unremarkable.  There is no evident thoracic adenopathy. No  esophageal lesions are evident. Lungs/Pleura: There is mild scarring and atelectasis in the lung bases. There is no edema or consolidation. No pleural effusions or pleural thickening evident. Upper Abdomen: For contour remains somewhat nodular. There are areas of decreased attenuation throughout multiple sites in the liver, similar to prior study, consistent with hepatic metastatic disease. Lesions are better delineated on venous phase imaging on prior study compared to arterial phase imaging currently. Visualized upper abdominal structures appear otherwise unremarkable. Musculoskeletal: There are multiple sclerotic bony lesions throughout the thoracic region with the largest individual lesion at T12. No lytic or destructive bone lesions. Port noted anteriorly on the right. Review of the MIP images confirms the above findings. IMPRESSION: 1. No demonstrable pulmonary embolus. No thoracic aortic aneurysm or dissection. There are foci of coronary artery calcification. 2. Areas of mild scarring and atelectasis in the lung base regions. Lungs otherwise clear. 3.  No adenopathy evident. 4. Multiple liver metastases, better seen on venous phase imaging. Liver has a nodular contour consistent with a degree of underlying cirrhosis. 5.  Extensive sclerotic bony metastases. 6. Rather minimal pericardial effusion which may be within physiologic range. Electronically Signed   By: Lowella Grip III M.D.   On: 09/26/2019 11:54    Procedures Procedures (including critical care time)  Medications Ordered in ED Medications  sodium chloride (PF) 0.9 % injection (has no administration in time range)  iohexol (OMNIPAQUE) 350 MG/ML injection 80 mL (80 mLs Intravenous Contrast Given 09/26/19 1118)     Initial Impression / Assessment and Plan / ED Course  I have reviewed the triage vital signs and the nursing notes.  Pertinent labs & imaging results that were available during  my care of the patient were reviewed by me and considered in my medical decision making (see chart for details).     Huy Majid is a 58 year old male with history of liver cancer no longer on active therapy, alcohol abuse who presents to the ED with shortness of breath.  Patient with unremarkable vitals except for mild hypertension.  Had sudden onset of shortness of breath prior to arrival that has now improved.  He had palpitations during that event.  He states maybe he was anxious.  Denies any history of PE.  Does not have any chest pain.  EKG shows sinus rhythm.  No ischemic changes.  Denies any cough, infectious symptoms.  No history of coronavirus or exposure to coronavirus.  No signs of volume overload on exam.  Possibly decreased breath sounds in the right lower base.  No wheezing.  Will evaluate with blood work, troponin, D-dimer.  Concern for anxiety versus infectious process versus blood clot.  No significant anemia, electrolyte abnormality, kidney injury.  D-dimer is elevated but CT scan did not show PE.  Has likely liver metastasis and bony metastasis that are chronic.  No infectious findings.  Troponin negative x2.  Doubt ACS. Heart Score 2.  No significant leukocytosis.  Asymptomatic throughout my care.  Possibly anxiety for event today.  Recommend follow-up with his oncologist, primary care doctor.  This chart was dictated using voice recognition software.  Despite best efforts to proofread,  errors can occur which can change the documentation meaning.    Final Clinical Impressions(s) / ED Diagnoses   Final diagnoses:  Shortness of breath    ED Discharge Orders    None       Lennice Sites, DO 09/26/19 1312

## 2019-09-26 NOTE — ED Notes (Signed)
Patient transported to CT 

## 2019-09-26 NOTE — ED Notes (Signed)
Pt denies SHOB or chest pain at this time.  Pt on cardiac and O2 monitoring

## 2019-09-26 NOTE — ED Notes (Signed)
Family at bedside. 

## 2019-09-26 NOTE — ED Triage Notes (Signed)
Patient states he was walking around in his house at 0800 today and had a sudden onset of SOB. Patient states he felt like his heart rate was elevated for approx 45 minutes. Patient states he had called EMS and when EMS arrived his BP was elevated. Patient states he decided to drive himself because he did not want to leave his daughter alone.

## 2019-09-26 NOTE — ED Notes (Signed)
ED Provider at bedside. 

## 2019-09-26 NOTE — ED Notes (Signed)
Pt ambulatory to xray.

## 2019-09-27 ENCOUNTER — Other Ambulatory Visit: Payer: Self-pay | Admitting: Oncology

## 2019-09-27 DIAGNOSIS — Z7189 Other specified counseling: Secondary | ICD-10-CM

## 2019-09-27 DIAGNOSIS — C787 Secondary malignant neoplasm of liver and intrahepatic bile duct: Secondary | ICD-10-CM

## 2019-09-27 MED ORDER — OXYCODONE HCL 5 MG PO TABS: 5.0000 mg | ORAL_TABLET | ORAL | 0 refills | Status: DC | PRN

## 2019-09-27 MED ORDER — LORAZEPAM 0.5 MG PO TABS: 0.5000 mg | ORAL_TABLET | Freq: Four times a day (QID) | ORAL | 0 refills | Status: DC | PRN

## 2019-10-03 ENCOUNTER — Ambulatory Visit (HOSPITAL_COMMUNITY)
Admission: RE | Admit: 2019-10-03 | Discharge: 2019-10-03 | Disposition: A | Discharge status: Home / Self Care | Payer: Medicaid Other | Source: Ambulatory Visit | Attending: Hematology | Admitting: Hematology

## 2019-10-03 ENCOUNTER — Other Ambulatory Visit: Payer: Self-pay

## 2019-10-03 DIAGNOSIS — C801 Malignant (primary) neoplasm, unspecified: Secondary | ICD-10-CM | POA: Diagnosis present

## 2019-10-03 DIAGNOSIS — C787 Secondary malignant neoplasm of liver and intrahepatic bile duct: Secondary | ICD-10-CM | POA: Insufficient documentation

## 2019-10-03 DIAGNOSIS — C7951 Secondary malignant neoplasm of bone: Secondary | ICD-10-CM | POA: Insufficient documentation

## 2019-10-03 MED ORDER — SODIUM CHLORIDE (PF) 0.9 % IJ SOLN
INTRAMUSCULAR | Status: AC
  Filled 2019-10-03: qty 50

## 2019-10-03 MED ORDER — IOHEXOL 300 MG/ML  SOLN
100.0000 mL | Freq: Once | INTRAMUSCULAR | Status: AC | PRN
  Administered 2019-10-03: 100 mL via INTRAVENOUS

## 2019-10-03 NOTE — Progress Notes (Signed)
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  HISTORY OF PRESENTING ILLNESS:   [name] is a wonderful 58 y.o. male who has been referred to [place] by Dr. Francine Graven for evaluation and management of Liver masses. He is accompanied today by his wife and aunt. The pt reports that he is doing well overall.   The pt presented to the ED on 10/01/18 regarding persistent RUQ abdominal pain which began a month prior, and noted associations with nausea and 40 pound weight loss. He was evaluated with a CT A/P, as noted below, which revealed liver masses.   The pt reports that he first began feeling RUQ abdominal pain about 3-4 weeks ago, but began losing weight about 2 months ago. He notes that he began feeling more fatigue about 2 months ago as well.  The pt notes that the color of his bowels became dark black and denies taking Iron pills or bismuth medications. He notes that he began feeling constipated as well, feeling backed up. He notes that his stools began looking thinner in caliber. The pt notes that his appetite began decreasing over the last 3-4 weeks ago, and some of this he attributes to intermittent nausea. He denies problems swallowing food. He endorses RUQ pain, and denies vomiting and new cough, new bone pains, testicular pain or swelling, problems passing urine, blood in the urine. The pt has never had a colonoscopy and does not have a PCP, and cites his lack of health insurance as the reason.  The pt was released from the ED with Oxycodone and has been using this to treat his RUQ pain successfully.   The pt notes gout and a heart murmur as previous medical history. He notes that he hasn't consumed alcohol in the past 2 years whatsoever, but prior to this alcohol was a problem for  him.   Of note prior to the patient's visit today, pt has had a CT A/P completed on 10/01/18 with results revealing Interval development of numerous ill-defined hypodense masses throughout the entire liver compatible with metastatic disease. There is a 2.5 cm enhancing mass over the upper pole cortex of the right kidney suspicious for renal cell carcinoma. 2.  No acute findings in the abdomen/pelvis. 3. 1 cm hypodensity over the pancreatic tail not well seen on previous noncontrast exams. MR may be helpful for further evaluation on an elective basis.   Most recent lab results (10/06/18) of CBC w/diff and CMP is as follows: all values are WNL except for Monocytes abs at 1.2k, Sodium at 134, Albumin at 3.0, AST at 77, ALT at 63, Alk Phos at 392, Total Bilirubin at 2.3   On review of systems, pt reports new constipation, smaller caliber stools, dark stools, unexpected weight loss, RUQ pain, new fatigue, and denies vomiting, cough, new SOB, changes in breathing, new bone pains, leg swelling, blood in the urine, changes in urination, fevers, pain along the spine, and any other symptoms.   On PMHx the pt reports Gout, heart murmur, previous alcohol abuse. He denies any surgeries.  On Social Hx the pt reports that he hasn't consumed alcohol in the past 2 years whatsoever. He notes that alcohol abuse was previously a concern. He denies ever smoking cigarettes or doing drugs. He denies any radiation or chemical exposure.  On Family Hx  the pt reports mother with pancreatic cancer in 48s, maternal aunts with high blood pressure and heart failure, maternal aunt with breast cancer.   INTERVAL HISTORY:  [name] returns today for management, evaluation, after 6 cycles of treatment of his recently diagnosed High grade neuroendocrine carcinoma. The patient's last visit with [place] was on 08/26/2019. The pt reports that he is doing well overall.  The pt reports that he was scheduled to go to his CT scan on 11/23  but his heart began fluttering so he cancelled the appointment and went to the ED. In the ED they were not able to discover the cause of his heart palpitations. Pt has also been experiencing more body aches since the weather has gotten colder. He has been using Oxycodone at night for the aches. He has recently felt more bloated and has been having occasional headaches but denies any vision changes. Pt has also had a lower appetite, as he usually does as it gets colder.   Of note since the patient's last visit, pt has had CT C/A/P ([id number]) ([id number]) completed on 10/03/2019 with results revealing 1. Increasing metastatic burden in the liver. Although individual masses are difficult to measure due to the diffuse nature of the hepatic metastatic disease, there is overall increase in size of the hepatomegaly associated with the diffuse widespread tumor involvement of the liver. 2. A nodule anteriorly in the right upper lobe measures only 3 mm in diameter but appears more solid than on prior exams, and could possibly be metastatic. 3. Mild thickening of the left adrenal gland, increased from prior, early metastatic lesion not excluded. 4. Mild and mildly increased ascites. 5. Perigastric collateral vessels indicating portal venous hypertension. 6. Stable widespread faintly sclerotic osseous metastatic disease, no progression or change. 7. Poorly defined 1.5 by 0.8 cm hypodensity in the tail the pancreas, roughly stable, favoring a small metastatic lesion. 8. Minimally enlarged enhancing lesion along the right kidney upper pole, potentially a small renal cell carcinoma or small metastatic lesion along the renal capsular margin. 9.  Prominent stool throughout the colon favors constipation. 10. Other imaging findings of potential clinical significance: Aortic Atherosclerosis (ICD10-I70.0). Multilevel lumbar impingement from spondylosis and degenerative disc disease."  Lab results today (10/04/19) of CBC w/diff  and CMP is as follows: all values are WNL except for Hgb at 12.5, PLTs at 106K, Sodium at 133, Glucose at 117, BUN at 28, Creatinine at 1.40, Total Protein at 9.6, Alkaline Phosphatase at 169. 10/04/2019 LDH at 279  On review of systems, pt reports body aches, heart palpitations, occasional headaches, bloating, low appetite, weight loss and denies diarrhea, hot flashes, vision changes and any other symptoms.   MEDICAL HISTORY:  Past Medical History:  Diagnosis Date   Arthritis    "knees; left hand"   Cancer ([place])    Gout    Heart murmur    Longstanding. Mild aortic valve thickening with trivial AR by echo 02/2012   Hypertension    Near syncope    05/2011 with tachypalpitations & with echo showing mild LVH, normal EF 55-60%, trivial AR    SURGICAL HISTORY: Past Surgical History:  Procedure Laterality Date   IR IMAGING GUIDED PORT INSERTION  10/26/2018   NO PAST SURGERIES      SOCIAL HISTORY: Social History   Socioeconomic History   Marital status: Married    Spouse name: Not on file   Number of children: 1   Years of education: Not on file   Highest education  level: Not on file  Occupational History   Occupation: Unemployed  Scientist, product/process development strain: Not on file   Food insecurity    Worry: Not on file    Inability: Not on file   Transportation needs    Medical: Not on file    Non-medical: Not on file  Tobacco Use   Smoking status: Never Smoker   Smokeless tobacco: Never Used  Substance and Sexual Activity   Alcohol use: Not Currently    Comment: states he has cut back/ every now and again   Drug use: No   Sexual activity: Yes    Partners: Female  Lifestyle   Physical activity    Days per week: Not on file    Minutes per session: Not on file   Stress: Not on file  Relationships   Social connections    Talks on phone: Not on file    Gets together: Not on file    Attends religious service: Not on file    Active member  of club or organization: Not on file    Attends meetings of clubs or organizations: Not on file    Relationship status: Not on file   Intimate partner violence    Fear of current or ex partner: Not on file    Emotionally abused: Not on file    Physically abused: Not on file    Forced sexual activity: Not on file  Other Topics Concern   Not on file  Social History Narrative   Married. 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 Is from [place].     FAMILY HISTORY: Family History  Problem Relation Age of Onset   Cancer Mother        Possibly pancreatic. Died at age 27   Prostate cancer Father    Breast cancer Cousin    Diabetes Paternal Uncle    Heart disease Paternal Uncle     ALLERGIES:  has No Known Allergies.  MEDICATIONS:  Current Outpatient Medications  Medication Sig Dispense Refill   allopurinol (ZYLOPRIM) 100 MG tablet Take 2 tablets (200 mg total) by mouth daily. (Patient not taking: Reported on 05/10/2019) 60 tablet 1   clindamycin (CLEOCIN) 300 MG capsule Take 1 capsule (300 mg total) by mouth 4 (four) times daily. X 7 days (Patient not taking: Reported on 05/10/2019) 28 capsule 0   colchicine 0.6 MG tablet Take 1 tablet (0.6 mg total) by mouth See admin instructions. Take 1.76m (2 tablets) followed by .615m(1 tablet) 1 hour later. (Patient not taking: Reported on 05/10/2019) 3 tablet 0   dexamethasone (DECADRON) 4 MG tablet 8m37mo daily with breakfast for 2 weeks then every other day for 2 weeks (Patient not taking: Reported on 05/10/2019) 30 tablet 0   furosemide (LASIX) 20 MG tablet Take 1 tablet (20 mg total) by mouth daily. (Patient not taking: Reported on 05/10/2019) 30 tablet 1   lidocaine-prilocaine (EMLA) cream Apply to affected area once (Patient not taking: Reported on 05/10/2019) 30 g 3   LORazepam (ATIVAN) 0.5 MG tablet Take 1 tablet (0.5 mg total) by mouth every 6 (six) hours as needed (Nausea or vomiting). 30  tablet 0   ondansetron (ZOFRAN ODT) 4 MG disintegrating tablet Take 1 tablet (4 mg total) by mouth every 8 (eight) hours as needed for nausea or vomiting. (Patient not taking: Reported on 05/10/2019) 6 tablet 0   oxyCODONE (OXY IR/ROXICODONE) 5 MG immediate release tablet  Take 1-2 tablets (5-10 mg total) by mouth every 4 (four) hours as needed for severe pain. 120 tablet 0   potassium chloride SA (K-DUR,KLOR-CON) 20 MEQ tablet Take 1 tablet (20 mEq total) by mouth daily. With lasix (Patient not taking: Reported on 06/20/2019) 30 tablet 1   prochlorperazine (COMPAZINE) 10 MG tablet Take 1 tablet (10 mg total) by mouth every 6 (six) hours as needed (Nausea or vomiting). (Patient not taking: Reported on 05/10/2019) 30 tablet 1   senna-docusate (SENNA S) 8.6-50 MG tablet Take 2 tablets by mouth at bedtime. 60 tablet 1   No current facility-administered medications for this visit.    Facility-Administered Medications Ordered in Other Visits  Medication Dose Route Frequency Provider Last Rate Last Dose   denosumab (XGEVA) injection 120 mg  120 mg Subcutaneous Once Irene Limbo, Cloria Spring, MD        REVIEW OF SYSTEMS:   A 10+ POINT REVIEW OF SYSTEMS WAS OBTAINED including neurology, dermatology, psychiatry, cardiac, respiratory, lymph, extremities, GI, GU, Musculoskeletal, constitutional, breasts, reproductive, HEENT.  All pertinent positives are noted in the HPI.  All others are negative.   PHYSICAL EXAMINATION: ECOG FS:2 - Symptomatic, <50% confined to bed   Vitals:   10/04/19 1247  BP: 140/69  Pulse: 85  Resp: 18  Temp: 98.7 F (37.1 C)  SpO2: 100%   Wt Readings from Last 3 Encounters:  10/04/19 154 lb 6.4 oz (70 kg)  09/26/19 170 lb (77.1 kg)  08/26/19 163 lb 11.2 oz (74.3 kg)   Body mass index is 24.18 kg/m.    GENERAL:alert, in no acute distress and comfortable SKIN: no acute rashes, no significant lesions EYES: conjunctiva are pink and non-injected, sclera anicteric OROPHARYNX:  MMM, no exudates, no oropharyngeal erythema or ulceration NECK: supple, no JVD LYMPH:  no palpable lymphadenopathy in the cervical, axillary or inguinal regions LUNGS: clear to auscultation b/l with normal respiratory effort HEART: regular rate & rhythm ABDOMEN:  normoactive bowel sounds , non tender, not distended. No palpable splenomegaly. Hepatomegaly 4 finger breadths below the costal margin. Extremity: no pedal edema PSYCH: alert & oriented x 3 with fluent speech NEURO: no focal motor/sensory deficits   LABORATORY DATA:  I have reviewed the data as listed  . CBC Latest Ref Rng & Units 10/04/2019 09/26/2019 08/26/2019  WBC 4.0 - 10.5 K/uL 4.4 5.6 4.8  Hemoglobin 13.0 - 17.0 g/dL 12.5(L) 12.1(L) 11.9(L)  Hematocrit 39.0 - 52.0 % 39.1 38.8(L) 36.7(L)  Platelets 150 - 400 K/uL 106(L) 132(L) 104(L)   CMP Latest Ref Rng & Units 10/04/2019 09/26/2019 08/26/2019  Glucose 70 - 99 mg/dL 117(H) 116(H) 116(H)  BUN 6 - 20 mg/dL 28(H) 17 11  Creatinine 0.61 - 1.24 mg/dL 1.40(H) 0.92 0.96  Sodium 135 - 145 mmol/L 133(L) 134(L) 138  Potassium 3.5 - 5.1 mmol/L 4.9 4.3 4.0  Chloride 98 - 111 mmol/L 99 101 104  CO2 22 - 32 mmol/L 23 21(L) 23  Calcium 8.9 - 10.3 mg/dL 9.3 9.2 9.0  Total Protein 6.5 - 8.1 g/dL 9.6(H) 9.5(H) 8.8(H)  Total Bilirubin 0.3 - 1.2 mg/dL 0.9 1.1 0.7  Alkaline Phos 38 - 126 U/L 169(H) 141(H) 130(H)  AST 15 - 41 U/L 35 34 21  ALT 0 - 44 U/L '18 21 15   ' Lab Results  Component Value Date   LDH 279 (H) 10/04/2019    03/31/2019 Mismatch Repair Protein Report   10/08/18 Liver Biopsy:    RADIOGRAPHIC STUDIES: I have personally reviewed the radiological  images as listed and agreed with the findings in the report. Dg Chest 2 View  Result Date: 09/26/2019 CLINICAL DATA:  Chest pain and shortness of breath EXAM: CHEST - 2 VIEW COMPARISON:  May 10, 2019 FINDINGS: Right chest wall port catheter tip is unchanged in position. Stable elevation of the right hemidiaphragm. No  new consolidation or edema. No pleural effusion or pneumothorax. Cardiomediastinal contours are within normal limits. IMPRESSION: No acute process in the chest. Electronically Signed   By: Macy Mis M.D.   On: 09/26/2019 10:11   Ct Chest W Contrast  Result Date: 10/03/2019 CLINICAL DATA:  Restaging metastatic small cell neuroendocrine tumor EXAM: CT CHEST, ABDOMEN, AND PELVIS WITH CONTRAST TECHNIQUE: Multidetector CT imaging of the chest, abdomen and pelvis was performed following the standard protocol during bolus administration of intravenous contrast. CONTRAST:  174m OMNIPAQUE IOHEXOL 300 MG/ML  SOLN COMPARISON:  Multiple exams, including CT examinations of 07/27/2019 09/26/2019 FINDINGS: CT CHEST FINDINGS Cardiovascular: Right Port-A-Cath tip: Right atrium. Mild atherosclerotic calcification of the aortic branch vessels. Upper normal heart size. Mediastinum/Nodes: Unremarkable Lungs/Pleura: Stable 3 mm right upper lobe nodule on image 46/5. Increasing conspicuity of the 3 mm right upper lobe nodule on image 51/5, previously present but less solid appearing. There is atelectasis or scarring along both hemidiaphragms. Mild scarring in the superior segment right lower lobe. Musculoskeletal: Stable widespread faintly sclerotic osseous metastatic disease, no appreciable change or progression compared to 07/27/2019. CT ABDOMEN PELVIS FINDINGS Hepatobiliary: Heavy burden of metastatic disease to the liver causing a pseudo cirrhosis appearance. It is difficult to measure individual lesions because they are confluent, but there is felt to be overall progression and increasing hepatomegaly, craniocaudad extent of the liver 24.7 cm, previously 20.0 cm. Gallbladder grossly unremarkable. Pancreas: Punctate calcification in the tail the pancreas. Poorly defined 1.5 by 0.8 cm hypodense lesion in the tail the pancreas appears roughly stable, significance uncertain. Spleen: Scattered calcifications compatible with old  granulomatous disease. Adrenals/Urinary Tract: The lateral limb of the left adrenal gland measures 1.0 cm in thickness, previously 0.8 cm. This expansion is of uncertain significance but could be an indicator of a early metastatic lesion. On coronal images, a 2.3 by 1.3 cm lesion of the right kidney upper pole is present, suspicious for a small renal cell carcinoma or perirenal tumor, previously measuring 2.0 by 1.3 cm. Stomach/Bowel: Prominent stool throughout the colon favors constipation. Vascular/Lymphatic: Aortoiliac atherosclerotic vascular disease. There are narrowings of some of the intrahepatic portions of the portal vein without visible thrombosis. Right gastric and left gastric collateral vessels noted. Reproductive: Unremarkable Other: Mild ascites in the perihepatic region and pelvis Musculoskeletal: Stable pattern of faintly sclerotic widespread osseous metastatic disease. Lumbar impingement at L3-4, L4-5, and L5-S1 due to spondylosis and degenerative disc disease. IMPRESSION: 1. Increasing metastatic burden in the liver. Although individual masses are difficult to measure due to the diffuse nature of the hepatic metastatic disease, there is overall increase in size of the hepatomegaly associated with the diffuse widespread tumor involvement of the liver. 2. A nodule anteriorly in the right upper lobe measures only 3 mm in diameter but appears more solid than on prior exams, and could possibly be metastatic. 3. Mild thickening of the left adrenal gland, increased from prior, early metastatic lesion not excluded. 4. Mild and mildly increased ascites. 5. Perigastric collateral vessels indicating portal venous hypertension. 6. Stable widespread faintly sclerotic osseous metastatic disease, no progression or change. 7. Poorly defined 1.5 by 0.8 cm hypodensity in the tail  the pancreas, roughly stable, favoring a small metastatic lesion. 8. Minimally enlarged enhancing lesion along the right kidney upper  pole, potentially a small renal cell carcinoma or small metastatic lesion along the renal capsular margin. 9.  Prominent stool throughout the colon favors constipation. 10. Other imaging findings of potential clinical significance: Aortic Atherosclerosis (ICD10-I70.0). Multilevel lumbar impingement from spondylosis and degenerative disc disease. Electronically Signed   By: Van Clines M.D.   On: 10/03/2019 15:31   Ct Angio Chest Pe W And/or Wo Contrast  Result Date: 09/26/2019 CLINICAL DATA:  Metastatic neuroendocrine carcinoma chest pain and shortness of breath EXAM: CT ANGIOGRAPHY CHEST WITH CONTRAST TECHNIQUE: Multidetector CT imaging of the chest was performed using the standard protocol during bolus administration of intravenous contrast. Multiplanar CT image reconstructions and MIPs were obtained to evaluate the vascular anatomy. CONTRAST:  45m OMNIPAQUE IOHEXOL 350 MG/ML SOLN COMPARISON:  CT angiogram chest July 27, 2019 FINDINGS: Cardiovascular: There is no demonstrable pulmonary embolus. There is no appreciable thoracic aortic aneurysm or dissection. Visualized great vessels appear unremarkable. There is no appreciable pericardial thickening. There is a rather minimal pericardial effusion, likely within physiologic range. Port-A-Cath tip is in the superior vena cava. There are foci of coronary artery calcification. Mediastinum/Nodes: Thyroid appears unremarkable. There is no evident thoracic adenopathy. No esophageal lesions are evident. Lungs/Pleura: There is mild scarring and atelectasis in the lung bases. There is no edema or consolidation. No pleural effusions or pleural thickening evident. Upper Abdomen: For contour remains somewhat nodular. There are areas of decreased attenuation throughout multiple sites in the liver, similar to prior study, consistent with hepatic metastatic disease. Lesions are better delineated on venous phase imaging on prior study compared to arterial phase  imaging currently. Visualized upper abdominal structures appear otherwise unremarkable. Musculoskeletal: There are multiple sclerotic bony lesions throughout the thoracic region with the largest individual lesion at T12. No lytic or destructive bone lesions. Port noted anteriorly on the right. Review of the MIP images confirms the above findings. IMPRESSION: 1. No demonstrable pulmonary embolus. No thoracic aortic aneurysm or dissection. There are foci of coronary artery calcification. 2. Areas of mild scarring and atelectasis in the lung base regions. Lungs otherwise clear. 3.  No adenopathy evident. 4. Multiple liver metastases, better seen on venous phase imaging. Liver has a nodular contour consistent with a degree of underlying cirrhosis. 5.  Extensive sclerotic bony metastases. 6. Rather minimal pericardial effusion which may be within physiologic range. Electronically Signed   By: WLowella GripIII M.D.   On: 09/26/2019 11:54   Ct Abdomen Pelvis W Contrast  Result Date: 10/03/2019 CLINICAL DATA:  Restaging metastatic small cell neuroendocrine tumor EXAM: CT CHEST, ABDOMEN, AND PELVIS WITH CONTRAST TECHNIQUE: Multidetector CT imaging of the chest, abdomen and pelvis was performed following the standard protocol during bolus administration of intravenous contrast. CONTRAST:  1037mOMNIPAQUE IOHEXOL 300 MG/ML  SOLN COMPARISON:  Multiple exams, including CT examinations of 07/27/2019 09/26/2019 FINDINGS: CT CHEST FINDINGS Cardiovascular: Right Port-A-Cath tip: Right atrium. Mild atherosclerotic calcification of the aortic branch vessels. Upper normal heart size. Mediastinum/Nodes: Unremarkable Lungs/Pleura: Stable 3 mm right upper lobe nodule on image 46/5. Increasing conspicuity of the 3 mm right upper lobe nodule on image 51/5, previously present but less solid appearing. There is atelectasis or scarring along both hemidiaphragms. Mild scarring in the superior segment right lower lobe. Musculoskeletal:  Stable widespread faintly sclerotic osseous metastatic disease, no appreciable change or progression compared to 07/27/2019. CT ABDOMEN PELVIS FINDINGS Hepatobiliary: Heavy  burden of metastatic disease to the liver causing a pseudo cirrhosis appearance. It is difficult to measure individual lesions because they are confluent, but there is felt to be overall progression and increasing hepatomegaly, craniocaudad extent of the liver 24.7 cm, previously 20.0 cm. Gallbladder grossly unremarkable. Pancreas: Punctate calcification in the tail the pancreas. Poorly defined 1.5 by 0.8 cm hypodense lesion in the tail the pancreas appears roughly stable, significance uncertain. Spleen: Scattered calcifications compatible with old granulomatous disease. Adrenals/Urinary Tract: The lateral limb of the left adrenal gland measures 1.0 cm in thickness, previously 0.8 cm. This expansion is of uncertain significance but could be an indicator of a early metastatic lesion. On coronal images, a 2.3 by 1.3 cm lesion of the right kidney upper pole is present, suspicious for a small renal cell carcinoma or perirenal tumor, previously measuring 2.0 by 1.3 cm. Stomach/Bowel: Prominent stool throughout the colon favors constipation. Vascular/Lymphatic: Aortoiliac atherosclerotic vascular disease. There are narrowings of some of the intrahepatic portions of the portal vein without visible thrombosis. Right gastric and left gastric collateral vessels noted. Reproductive: Unremarkable Other: Mild ascites in the perihepatic region and pelvis Musculoskeletal: Stable pattern of faintly sclerotic widespread osseous metastatic disease. Lumbar impingement at L3-4, L4-5, and L5-S1 due to spondylosis and degenerative disc disease. IMPRESSION: 1. Increasing metastatic burden in the liver. Although individual masses are difficult to measure due to the diffuse nature of the hepatic metastatic disease, there is overall increase in size of the hepatomegaly  associated with the diffuse widespread tumor involvement of the liver. 2. A nodule anteriorly in the right upper lobe measures only 3 mm in diameter but appears more solid than on prior exams, and could possibly be metastatic. 3. Mild thickening of the left adrenal gland, increased from prior, early metastatic lesion not excluded. 4. Mild and mildly increased ascites. 5. Perigastric collateral vessels indicating portal venous hypertension. 6. Stable widespread faintly sclerotic osseous metastatic disease, no progression or change. 7. Poorly defined 1.5 by 0.8 cm hypodensity in the tail the pancreas, roughly stable, favoring a small metastatic lesion. 8. Minimally enlarged enhancing lesion along the right kidney upper pole, potentially a small renal cell carcinoma or small metastatic lesion along the renal capsular margin. 9.  Prominent stool throughout the colon favors constipation. 10. Other imaging findings of potential clinical significance: Aortic Atherosclerosis (ICD10-I70.0). Multilevel lumbar impingement from spondylosis and degenerative disc disease. Electronically Signed   By: Van Clines M.D.   On: 10/03/2019 15:31    ASSESSMENT & PLAN:  58 y.o. male with  1. 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 There is a 2.5 cm enhancing mass over the upper pole cortex of the right kidney suspicious for renal cell carcinoma. 2.  No acute findings in the abdomen/pelvis. 3. 1 cm hypodensity over the pancreatic tail not well seen on previous noncontrast exams. MR may be helpful for further evaluation on an elective basis.    10/06/18 Hep B and Hep C were negative   10/06/18 CA19-9 elevated at 652, AFP slightly elevated at 8.8, CEA normal at 2.13, LDH slightly elevated at 215  10/08/18 US Liver Biopsy which revealed High  Grade Neuroendocrine Carcinoma  10/15/18 PET/CT revealed Diffuse hepatic and osseous metastatic disease without obvious primary neoplasm. 2. Upper pole right renal lesion is not definitely hypermetabolic and has been present since 2012. I think is unlikely the  cause of the metastatic disease. Recommend liver biopsy for tissue diagnosis. If needed, an MRI of the abdomen without and with contrast may be helpful for further evaluation of the renal lesion. 3. Two sub 3 mm right upper lobe pulmonary nodules are indeterminate.   10/26/18 MRI Brain revealed No acute abnormality and negative for metastatic disease.  03/15/19 CT C/A/P which revealed "Significant improvement in multifocal liver metastasis. 2. Interval development of multifocal areas of sclerosis within the visualized axial and proximal appendicular skeleton. These are favored to represent areas of healing bone metastases. 3. Stable lesion within distal tail of pancreas. 4. Aortic Atherosclerosis."   06/19/2019 CT abdomen and pelvis with results revealing "Widespread hepatic metastasis. Individual lesions are difficult to measure however the overall size of the liver is increased significantly and therefore findings are consistent with progression of hepatic metastasis compared to CT of 03/15/2019). No bowel obstruction. No abdominopelvic lymphadenopathy. Stable enhancing small RIGHT renal mass. Stable sclerotic skeletal metastasis."  07/27/2019 CT of the chest abdomen and pelvis with results revealing "Redemonstrated hepatomegaly with numerous bulky hypodense metastatic lesions, generally ill-defined and not significantly changed in size compared to prior examination, a partially exophytic index lesion of the anterior right lobe of the liver measuring 3.3 cm, previously 3.3 cm (series 2, image 58), an index lesion of the lateral right lobe of the liver measuring 5.6 cm, previously 5.6 cm (series 2, image 56), an index lesion of the anterior left lobe  of the liver measuring 4.3 cm, previously 4.5 cm (series 2, image 50). Unchanged sclerotic osseous lesions, including of the T12 vertebral body, L4 vertebral body and bilateral proximal femurs (series 4, image 94, 85, 67, 70). Findings are consistent with unchanged metastatic disease without new evidence of metastasis in the chest, abdomen, or pelvis. There is a redemonstrated, exophytic small mass of the superior pole of the right kidney, best appreciated on coronal series measuring approximately 1.9 x 1.4 cm (series 4, image 87), not significantly changed compared to prior examinations and remain concerning for a small incidental renal cell carcinoma. Aortic Atherosclerosis (ICD10-I70.0) and Emphysema (ICD10-J43.9)."    S/p 6 cycles of Carboplatin and 20% dose reduced Etoposide completed on 02/19/19. Dose reduced as pt developed significant thrombocytopenia after C1.  2. Abnormal LFTs - likely from liver metastases  Labs upon initial presentation from 10/06/18, some borderline monocytosis at 1.2k, otherwise normal blood counts, AST at 77, ALT at 63, Alk Phos at 392, Total Bilirubin at 2.3   3. Rt renal mass -  seen on 10/15/18 PET/CT but no longer observed on 03/15/19 CT C/A/P  4.Scattered osseous metastases-  07/27/2019 whole body bone scan with results revealing Scattered osseous metastases  as above, including proximal LEFT humerus, RIGHT humeral diaphysis, and BILATERAL femoral diaphyses.    PLAN: -Discussed pt labwork today, 10/04/19; blood counts are stable, PLTs continue to remain low, blood chemistries suggest dehydration -Discussed 10/04/2019 LDH is increasing at 279 -Discussed 10/03/2019 CT C/A/P ([id number]) ([id number]) which shows disease progression in the liver and a minimal enlargement of a kidney lesion -Discussed restarting treatment with Carboplatin + Etoposide vs. FOLFOX vs. combination immunotherapy -Concern for quick tumor progression not being contained with combination  immunotherapy - does not appear to be the best option -Would recommend repeating Carboplatin + Etoposide due to 6 months of progression free disease, also reserving other options for future treatments -Advised of 30-50% response to repeat treatment -Discussed palliative care options  -Pt would like to move forward with Carboplatin + Etoposide  -  Will begin Carboplatin/Etoposide in 5-7 days  -Will see back in 15-17 days with labs for a toxicity check    FOLLOW UP: Schedule to start carboplatin/etoposide in 5-7 days RTC with dr Irene Limbo in 15-17 days with labs for toxicity check  The total time spent in the appt was 25 minutes and more than 50% was on counseling and direct patient cares.  All of the patient's questions were answered with apparent satisfaction. The patient knows to call the clinic with any problems, questions or concerns.   Sullivan Lone MD [place] AAHIVMS [facility] Hematology/Oncology Physician [facility]  (Office):       [phone] (Work cell):  [phone] (Fax):           [phone]  10/04/2019 2:05 PM  I, Yevette Edwards, am acting as a scribe for Dr. Sullivan Lone.   .I have reviewed the above documentation for accuracy and completeness, and I agree with the above. Brunetta Genera MD

## 2019-10-04 ENCOUNTER — Inpatient Hospital Stay (HOSPITAL_BASED_OUTPATIENT_CLINIC_OR_DEPARTMENT_OTHER): Payer: Medicaid Other | Admitting: Hematology

## 2019-10-04 ENCOUNTER — Other Ambulatory Visit: Payer: Self-pay

## 2019-10-04 ENCOUNTER — Inpatient Hospital Stay: Payer: Medicaid Other | Attending: Hematology

## 2019-10-04 VITALS — BP 140/69 | HR 85 | Temp 98.7°F | Resp 18 | Ht 67.0 in | Wt 154.4 lb

## 2019-10-04 DIAGNOSIS — Z79899 Other long term (current) drug therapy: Secondary | ICD-10-CM | POA: Insufficient documentation

## 2019-10-04 DIAGNOSIS — C787 Secondary malignant neoplasm of liver and intrahepatic bile duct: Secondary | ICD-10-CM

## 2019-10-04 DIAGNOSIS — C7B02 Secondary carcinoid tumors of liver: Secondary | ICD-10-CM | POA: Diagnosis not present

## 2019-10-04 DIAGNOSIS — C7B03 Secondary carcinoid tumors of bone: Secondary | ICD-10-CM | POA: Insufficient documentation

## 2019-10-04 DIAGNOSIS — C7951 Secondary malignant neoplasm of bone: Secondary | ICD-10-CM

## 2019-10-04 DIAGNOSIS — C7A1 Malignant poorly differentiated neuroendocrine tumors: Secondary | ICD-10-CM | POA: Diagnosis present

## 2019-10-04 DIAGNOSIS — R945 Abnormal results of liver function studies: Secondary | ICD-10-CM | POA: Insufficient documentation

## 2019-10-04 DIAGNOSIS — C801 Malignant (primary) neoplasm, unspecified: Secondary | ICD-10-CM | POA: Diagnosis not present

## 2019-10-04 DIAGNOSIS — Z7689 Persons encountering health services in other specified circumstances: Secondary | ICD-10-CM | POA: Diagnosis not present

## 2019-10-04 DIAGNOSIS — Z5111 Encounter for antineoplastic chemotherapy: Secondary | ICD-10-CM | POA: Diagnosis present

## 2019-10-04 LAB — CMP (CANCER CENTER ONLY)
ALT: 18 U/L (ref 0–44)
AST: 35 U/L (ref 15–41)
Albumin: 3.5 g/dL (ref 3.5–5.0)
Alkaline Phosphatase: 169 U/L — ABNORMAL HIGH (ref 38–126)
Anion gap: 11 (ref 5–15)
BUN: 28 mg/dL — ABNORMAL HIGH (ref 6–20)
CO2: 23 mmol/L (ref 22–32)
Calcium: 9.3 mg/dL (ref 8.9–10.3)
Chloride: 99 mmol/L (ref 98–111)
Creatinine: 1.4 mg/dL — ABNORMAL HIGH (ref 0.61–1.24)
GFR, Est AFR Am: >60 mL/min (ref >60)
GFR, Estimated: 55 mL/min — ABNORMAL LOW (ref >60)
Glucose, Bld: 117 mg/dL — ABNORMAL HIGH (ref 70–99)
Potassium: 4.9 mmol/L (ref 3.5–5.1)
Sodium: 133 mmol/L — ABNORMAL LOW (ref 135–145)
Total Bilirubin: 0.9 mg/dL (ref 0.3–1.2)
Total Protein: 9.6 g/dL — ABNORMAL HIGH (ref 6.5–8.1)

## 2019-10-04 LAB — CBC WITH DIFFERENTIAL/PLATELET
Abs Immature Granulocytes: 0.02 10*3/uL (ref 0.00–0.07)
Basophils Absolute: 0 10*3/uL (ref 0.0–0.1)
Basophils Relative: 1 %
Eosinophils Absolute: 0 10*3/uL (ref 0.0–0.5)
Eosinophils Relative: 1 %
HCT: 39.1 % (ref 39.0–52.0)
Hemoglobin: 12.5 g/dL — ABNORMAL LOW (ref 13.0–17.0)
Immature Granulocytes: 1 %
Lymphocytes Relative: 26 %
Lymphs Abs: 1.1 10*3/uL (ref 0.7–4.0)
MCH: 27.2 pg (ref 26.0–34.0)
MCHC: 32 g/dL (ref 30.0–36.0)
MCV: 85.2 fL (ref 80.0–100.0)
Monocytes Absolute: 0.9 10*3/uL (ref 0.1–1.0)
Monocytes Relative: 19 %
Neutro Abs: 2.3 10*3/uL (ref 1.7–7.7)
Neutrophils Relative %: 52 %
Platelets: 106 10*3/uL — ABNORMAL LOW (ref 150–400)
RBC: 4.59 MIL/uL (ref 4.22–5.81)
RDW: 14.4 % (ref 11.5–15.5)
WBC: 4.4 10*3/uL (ref 4.0–10.5)
nRBC: 0 % (ref 0.0–0.2)

## 2019-10-04 LAB — LACTATE DEHYDROGENASE: LDH: 279 U/L — ABNORMAL HIGH (ref 98–192)

## 2019-10-05 ENCOUNTER — Telehealth: Payer: Self-pay | Admitting: Hematology

## 2019-10-05 NOTE — Telephone Encounter (Signed)
Scheduled appt per 12/1 los.  Spoke with pt and he is aware of his appt dates and time,

## 2019-10-06 ENCOUNTER — Other Ambulatory Visit: Payer: Self-pay | Admitting: Hematology

## 2019-10-06 DIAGNOSIS — C801 Malignant (primary) neoplasm, unspecified: Secondary | ICD-10-CM

## 2019-10-06 DIAGNOSIS — C787 Secondary malignant neoplasm of liver and intrahepatic bile duct: Secondary | ICD-10-CM

## 2019-10-06 DIAGNOSIS — Z7189 Other specified counseling: Secondary | ICD-10-CM

## 2019-10-06 MED ORDER — DEXAMETHASONE 4 MG PO TABS: 8.0000 mg | ORAL_TABLET | Freq: Every day | ORAL | 1 refills | Status: DC

## 2019-10-06 MED ORDER — ONDANSETRON HCL 8 MG PO TABS: 8.0000 mg | ORAL_TABLET | Freq: Two times a day (BID) | ORAL | 1 refills | Status: AC | PRN

## 2019-10-06 MED ORDER — LIDOCAINE-PRILOCAINE 2.5-2.5 % EX CREA: TOPICAL_CREAM | CUTANEOUS | 3 refills | Status: DC

## 2019-10-06 NOTE — Progress Notes (Signed)
OFF PATHWAY REGIMEN - [Other Dx]  No Change  Continue With Treatment as Ordered.   [id number]:Carboplatin AUC=5 D1 + Etoposide 100 mg/m2 D1-3 q21 Days:   A cycle is every 21 days:     Etoposide      Carboplatin   **Always confirm dose/schedule in your pharmacy ordering system**  Patient Characteristics: Intent of Therapy: Non-Curative / Palliative Intent, Discussed with Patient

## 2019-10-12 ENCOUNTER — Inpatient Hospital Stay: Payer: Medicaid Other

## 2019-10-12 ENCOUNTER — Other Ambulatory Visit: Payer: Self-pay

## 2019-10-12 ENCOUNTER — Other Ambulatory Visit: Payer: Self-pay | Admitting: *Deleted

## 2019-10-12 ENCOUNTER — Telehealth: Payer: Self-pay | Admitting: Hematology

## 2019-10-12 ENCOUNTER — Encounter: Payer: Self-pay | Admitting: Hematology

## 2019-10-12 VITALS — BP 118/62 | HR 68 | Temp 98.9°F | Resp 16 | Wt 156.8 lb

## 2019-10-12 DIAGNOSIS — C801 Malignant (primary) neoplasm, unspecified: Secondary | ICD-10-CM

## 2019-10-12 DIAGNOSIS — C787 Secondary malignant neoplasm of liver and intrahepatic bile duct: Secondary | ICD-10-CM

## 2019-10-12 DIAGNOSIS — Z5111 Encounter for antineoplastic chemotherapy: Secondary | ICD-10-CM | POA: Diagnosis not present

## 2019-10-12 DIAGNOSIS — Z7189 Other specified counseling: Secondary | ICD-10-CM

## 2019-10-12 LAB — CMP (CANCER CENTER ONLY)
ALT: 16 U/L (ref 0–44)
AST: 23 U/L (ref 15–41)
Albumin: 3.1 g/dL — ABNORMAL LOW (ref 3.5–5.0)
Alkaline Phosphatase: 165 U/L — ABNORMAL HIGH (ref 38–126)
Anion gap: 7 (ref 5–15)
BUN: 16 mg/dL (ref 6–20)
CO2: 25 mmol/L (ref 22–32)
Calcium: 8.9 mg/dL (ref 8.9–10.3)
Chloride: 104 mmol/L (ref 98–111)
Creatinine: 1.07 mg/dL (ref 0.61–1.24)
GFR, Est AFR Am: >60 mL/min (ref >60)
GFR, Estimated: >60 mL/min (ref >60)
Glucose, Bld: 116 mg/dL — ABNORMAL HIGH (ref 70–99)
Potassium: 4.4 mmol/L (ref 3.5–5.1)
Sodium: 136 mmol/L (ref 135–145)
Total Bilirubin: 0.7 mg/dL (ref 0.3–1.2)
Total Protein: 8.5 g/dL — ABNORMAL HIGH (ref 6.5–8.1)

## 2019-10-12 LAB — LACTATE DEHYDROGENASE: LDH: 152 U/L (ref 98–192)

## 2019-10-12 LAB — CBC WITH DIFFERENTIAL (CANCER CENTER ONLY)
Abs Immature Granulocytes: 0.04 10*3/uL (ref 0.00–0.07)
Basophils Absolute: 0 10*3/uL (ref 0.0–0.1)
Basophils Relative: 1 %
Eosinophils Absolute: 0.1 10*3/uL (ref 0.0–0.5)
Eosinophils Relative: 3 %
HCT: 37.1 % — ABNORMAL LOW (ref 39.0–52.0)
Hemoglobin: 11.9 g/dL — ABNORMAL LOW (ref 13.0–17.0)
Immature Granulocytes: 1 %
Lymphocytes Relative: 26 %
Lymphs Abs: 1.2 10*3/uL (ref 0.7–4.0)
MCH: 26.9 pg (ref 26.0–34.0)
MCHC: 32.1 g/dL (ref 30.0–36.0)
MCV: 83.9 fL (ref 80.0–100.0)
Monocytes Absolute: 0.7 10*3/uL (ref 0.1–1.0)
Monocytes Relative: 16 %
Neutro Abs: 2.5 10*3/uL (ref 1.7–7.7)
Neutrophils Relative %: 53 %
Platelet Count: 150 10*3/uL (ref 150–400)
RBC: 4.42 MIL/uL (ref 4.22–5.81)
RDW: 13.7 % (ref 11.5–15.5)
WBC Count: 4.6 10*3/uL (ref 4.0–10.5)
nRBC: 0 % (ref 0.0–0.2)

## 2019-10-12 MED ORDER — PALONOSETRON HCL INJECTION 0.25 MG/5ML
0.2500 mg | Freq: Once | INTRAVENOUS | Status: AC
  Administered 2019-10-12: 09:00:00 0.25 mg via INTRAVENOUS

## 2019-10-12 MED ORDER — SODIUM CHLORIDE 0.9 % IV SOLN
150.0000 mg | Freq: Once | INTRAVENOUS | Status: AC
  Administered 2019-10-12: 150 mg via INTRAVENOUS
  Filled 2019-10-12: qty 5

## 2019-10-12 MED ORDER — DEXAMETHASONE SODIUM PHOSPHATE 10 MG/ML IJ SOLN
10.0000 mg | Freq: Once | INTRAMUSCULAR | Status: AC
  Administered 2019-10-12: 09:00:00 10 mg via INTRAVENOUS

## 2019-10-12 MED ORDER — SODIUM CHLORIDE 0.9 % IV SOLN
80.0000 mg/m2 | Freq: Once | INTRAVENOUS | Status: AC
  Administered 2019-10-12: 11:00:00 150 mg via INTRAVENOUS
  Filled 2019-10-12: qty 7.5

## 2019-10-12 MED ORDER — SODIUM CHLORIDE 0.9 % IV SOLN
493.0000 mg | Freq: Once | INTRAVENOUS | Status: AC
  Administered 2019-10-12: 490 mg via INTRAVENOUS
  Filled 2019-10-12: qty 49

## 2019-10-12 MED ORDER — SODIUM CHLORIDE 0.9% FLUSH
10.0000 mL | INTRAVENOUS | Status: DC | PRN
  Administered 2019-10-12: 13:00:00 10 mL
  Filled 2019-10-12: qty 10

## 2019-10-12 MED ORDER — PALONOSETRON HCL INJECTION 0.25 MG/5ML
INTRAVENOUS | Status: AC
  Filled 2019-10-12: qty 5

## 2019-10-12 MED ORDER — HEPARIN SOD (PORK) LOCK FLUSH 100 UNIT/ML IV SOLN
500.0000 [IU] | Freq: Once | INTRAVENOUS | Status: AC | PRN
  Administered 2019-10-12: 500 [IU]
  Filled 2019-10-12: qty 5

## 2019-10-12 MED ORDER — SODIUM CHLORIDE 0.9 % IV SOLN
Freq: Once | INTRAVENOUS | Status: AC
  Administered 2019-10-12: 09:00:00 via INTRAVENOUS
  Filled 2019-10-12: qty 250

## 2019-10-12 MED ORDER — FAMOTIDINE IN NACL 20-0.9 MG/50ML-% IV SOLN
20.0000 mg | Freq: Once | INTRAVENOUS | Status: AC
  Administered 2019-10-12: 10:00:00 20 mg via INTRAVENOUS

## 2019-10-12 MED ORDER — DIPHENHYDRAMINE HCL 50 MG/ML IJ SOLN
INTRAMUSCULAR | Status: AC
  Filled 2019-10-12: qty 1

## 2019-10-12 MED ORDER — DEXAMETHASONE SODIUM PHOSPHATE 10 MG/ML IJ SOLN
INTRAMUSCULAR | Status: AC
  Filled 2019-10-12: qty 1

## 2019-10-12 MED ORDER — FAMOTIDINE IN NACL 20-0.9 MG/50ML-% IV SOLN
INTRAVENOUS | Status: AC
  Filled 2019-10-12: qty 50

## 2019-10-12 MED ORDER — DIPHENHYDRAMINE HCL 50 MG/ML IJ SOLN
25.0000 mg | Freq: Once | INTRAMUSCULAR | Status: AC
  Administered 2019-10-12: 10:00:00 25 mg via INTRAVENOUS

## 2019-10-12 NOTE — Progress Notes (Signed)
10/12/19  Plan updated for:  Carboplatin dose # 7 today - orders added per protocol - diphenhydramine 25 mg IVP x 1 and famotidine 20 mg IVPB x 1 as premedication.  Henreitta Leber, PharmD

## 2019-10-12 NOTE — Patient Instructions (Signed)
[facility] Discharge Instructions for Patients Receiving Chemotherapy  Today you received the following chemotherapy agents:  Etoposide and carboplatin.  To help prevent nausea and vomiting after your treatment, we encourage you to take your nausea medication as prescribed.   If you develop nausea and vomiting that is not controlled by your nausea medication, call the clinic.   BELOW ARE SYMPTOMS THAT SHOULD BE REPORTED IMMEDIATELY:  *FEVER GREATER THAN 100.5 F  *CHILLS WITH OR WITHOUT FEVER  NAUSEA AND VOMITING THAT IS NOT CONTROLLED WITH YOUR NAUSEA MEDICATION  *UNUSUAL SHORTNESS OF BREATH  *UNUSUAL BRUISING OR BLEEDING  TENDERNESS IN MOUTH AND THROAT WITH OR WITHOUT PRESENCE OF ULCERS  *URINARY PROBLEMS  *BOWEL PROBLEMS  UNUSUAL RASH Items with * indicate a potential emergency and should be followed up as soon as possible.  Feel free to call the clinic should you have any questions or concerns. The clinic phone number is [phone].  Please show the [place] at check-in to the Emergency Department and triage nurse.  Etoposide, VP-16 injection What is this medicine? ETOPOSIDE, VP-16 (e toe POE side) is a chemotherapy drug. It is used to treat testicular cancer, lung cancer, and other cancers. This medicine may be used for other purposes; ask your health care provider or pharmacist if you have questions. COMMON BRAND NAME(S): Etopophos, Toposar, VePesid What should I tell my health care provider before I take this medicine? They need to know if you have any of these conditions:  infection  kidney disease  liver disease  low blood counts, like low white cell, platelet, or red cell counts  an unusual or allergic reaction to etoposide, other medicines, foods, dyes, or preservatives  pregnant or trying to get pregnant  breast-feeding How should I use this medicine? This medicine is for infusion into a vein. It is administered in a  hospital or clinic by a specially trained health care professional. Talk to your pediatrician regarding the use of this medicine in children. Special care may be needed. Overdosage: If you think you have taken too much of this medicine contact a poison control center or emergency room at once. NOTE: This medicine is only for you. Do not share this medicine with others. What if I miss a dose? It is important not to miss your dose. Call your doctor or health care professional if you are unable to keep an appointment. What may interact with this medicine?  aspirin  certain medications for seizures like carbamazepine, phenobarbital, phenytoin, valproic acid  cyclosporine  levamisole  warfarin This list may not describe all possible interactions. Give your health care provider a list of all the medicines, herbs, non-prescription drugs, or dietary supplements you use. Also tell them if you smoke, drink alcohol, or use illegal drugs. Some items may interact with your medicine. What should I watch for while using this medicine? Visit your doctor for checks on your progress. This drug may make you feel generally unwell. This is not uncommon, as chemotherapy can affect healthy cells as well as cancer cells. Report any side effects. Continue your course of treatment even though you feel ill unless your doctor tells you to stop. In some cases, you may be given additional medicines to help with side effects. Follow all directions for their use. Call your doctor or health care professional for advice if you get a fever, chills or sore throat, or other symptoms of a cold or flu. Do not treat yourself. This drug decreases your  body's ability to fight infections. Try to avoid being around people who are sick. This medicine may increase your risk to bruise or bleed. Call your doctor or health care professional if you notice any unusual bleeding. Talk to your doctor about your risk of cancer. You may be more at  risk for certain types of cancers if you take this medicine. Do not become pregnant while taking this medicine or for at least 6 months after stopping it. Women should inform their doctor if they wish to become pregnant or think they might be pregnant. Women of child-bearing potential will need to have a negative pregnancy test before starting this medicine. There is a potential for serious side effects to an unborn child. Talk to your health care professional or pharmacist for more information. Do not breast-feed an infant while taking this medicine. Men must use a latex condom during sexual contact with a woman while taking this medicine and for at least 4 months after stopping it. A latex condom is needed even if you have had a vasectomy. Contact your doctor right away if your partner becomes pregnant. Do not donate sperm while taking this medicine and for at least 4 months after you stop taking this medicine. Men should inform their doctors if they wish to father a child. This medicine may lower sperm counts. What side effects may I notice from receiving this medicine? Side effects that you should report to your doctor or health care professional as soon as possible:  allergic reactions like skin rash, itching or hives, swelling of the face, lips, or tongue  low blood counts - this medicine may decrease the number of white blood cells, red blood cells and platelets. You may be at increased risk for infections and bleeding.  signs of infection - fever or chills, cough, sore throat, pain or difficulty passing urine  signs of decreased platelets or bleeding - bruising, pinpoint red spots on the skin, black, tarry stools, blood in the urine  signs of decreased red blood cells - unusually weak or tired, fainting spells, lightheadedness  breathing problems  changes in vision  mouth or throat sores or ulcers  pain, redness, swelling or irritation at the injection site  pain, tingling, numbness  in the hands or feet  redness, blistering, peeling or loosening of the skin, including inside the mouth  seizures  vomiting Side effects that usually do not require medical attention (report to your doctor or health care professional if they continue or are bothersome):  diarrhea  hair loss  loss of appetite  nausea  stomach pain This list may not describe all possible side effects. Call your doctor for medical advice about side effects. You may report side effects to FDA at 1-800-FDA-1088. Where should I keep my medicine? This drug is given in a hospital or clinic and will not be stored at home. NOTE: This sheet is a summary. It may not cover all possible information. If you have questions about this medicine, talk to your doctor, pharmacist, or health care provider.  2020 Elsevier/Gold Standard (2015-10-12 11:53:23)

## 2019-10-12 NOTE — Telephone Encounter (Signed)
Scheduled appt per 12/9 sch message - pt to get an updated schedule in treatment area before leaving

## 2019-10-13 ENCOUNTER — Telehealth: Payer: Self-pay | Admitting: *Deleted

## 2019-10-13 ENCOUNTER — Other Ambulatory Visit: Payer: Self-pay

## 2019-10-13 ENCOUNTER — Inpatient Hospital Stay: Payer: Medicaid Other

## 2019-10-13 VITALS — BP 143/67 | HR 75 | Temp 98.3°F | Resp 16

## 2019-10-13 DIAGNOSIS — C787 Secondary malignant neoplasm of liver and intrahepatic bile duct: Secondary | ICD-10-CM

## 2019-10-13 DIAGNOSIS — Z5111 Encounter for antineoplastic chemotherapy: Secondary | ICD-10-CM | POA: Diagnosis not present

## 2019-10-13 DIAGNOSIS — C801 Malignant (primary) neoplasm, unspecified: Secondary | ICD-10-CM

## 2019-10-13 DIAGNOSIS — Z7189 Other specified counseling: Secondary | ICD-10-CM

## 2019-10-13 MED ORDER — DEXAMETHASONE SODIUM PHOSPHATE 10 MG/ML IJ SOLN
10.0000 mg | Freq: Once | INTRAMUSCULAR | Status: AC
  Administered 2019-10-13: 10 mg via INTRAVENOUS

## 2019-10-13 MED ORDER — SODIUM CHLORIDE 0.9% FLUSH
10.0000 mL | INTRAVENOUS | Status: DC | PRN
  Administered 2019-10-13: 10 mL
  Filled 2019-10-13: qty 10

## 2019-10-13 MED ORDER — HEPARIN SOD (PORK) LOCK FLUSH 100 UNIT/ML IV SOLN
500.0000 [IU] | Freq: Once | INTRAVENOUS | Status: AC | PRN
  Administered 2019-10-13: 10:00:00 500 [IU]
  Filled 2019-10-13: qty 5

## 2019-10-13 MED ORDER — DEXAMETHASONE SODIUM PHOSPHATE 10 MG/ML IJ SOLN
INTRAMUSCULAR | Status: AC
  Filled 2019-10-13: qty 1

## 2019-10-13 MED ORDER — SODIUM CHLORIDE 0.9 % IV SOLN
80.0000 mg/m2 | Freq: Once | INTRAVENOUS | Status: AC
  Administered 2019-10-13: 150 mg via INTRAVENOUS
  Filled 2019-10-13: qty 7.5

## 2019-10-13 MED ORDER — SODIUM CHLORIDE 0.9 % IV SOLN
Freq: Once | INTRAVENOUS | Status: AC
  Administered 2019-10-13: 08:00:00 via INTRAVENOUS
  Filled 2019-10-13: qty 250

## 2019-10-13 MED ORDER — OXYCODONE HCL 5 MG PO TABS: 5.0000 mg | ORAL_TABLET | ORAL | 0 refills | Status: DC | PRN

## 2019-10-13 NOTE — Telephone Encounter (Signed)
Responded to MyChart question from patient's wife regarding whether or not the patient needed someone to stay at home with him after chemotherapy. Advised her that it is a decision made by the patient/family based on how the patient feels following treatment. She verbalized understanding.

## 2019-10-13 NOTE — Patient Instructions (Addendum)
[facility] Discharge Instructions for Patients Receiving Chemotherapy  Today you received the following chemotherapy agents: Etoposide  To help prevent nausea and vomiting after your treatment, we encourage you to take your nausea medication as directed by your MD.   If you develop nausea and vomiting that is not controlled by your nausea medication, call the clinic.   BELOW ARE SYMPTOMS THAT SHOULD BE REPORTED IMMEDIATELY:  *FEVER GREATER THAN 100.5 F  *CHILLS WITH OR WITHOUT FEVER  NAUSEA AND VOMITING THAT IS NOT CONTROLLED WITH YOUR NAUSEA MEDICATION  *UNUSUAL SHORTNESS OF BREATH  *UNUSUAL BRUISING OR BLEEDING  TENDERNESS IN MOUTH AND THROAT WITH OR WITHOUT PRESENCE OF ULCERS  *URINARY PROBLEMS  *BOWEL PROBLEMS  UNUSUAL RASH Items with * indicate a potential emergency and should be followed up as soon as possible.  Feel free to call the clinic should you have any questions or concerns. The clinic phone number is [phone].  Please show the [facility] at check-in to the Emergency Department and triage nurse.   Advance Directive  Advance directives are legal documents that let you make choices ahead of time about your health care and medical treatment in case you become unable to communicate for yourself. Advance directives are a way for you to communicate your wishes to family, friends, and health care providers. This can help convey your decisions about end-of-life care if you become unable to communicate. Discussing and writing advance directives should happen over time rather than all at once. Advance directives can be changed depending on your situation and what you want, even after you have signed the advance directives. If you do not have an advance directive, some states assign family decision makers to act on your behalf based on how closely you are related to them. Each state has its own laws regarding advance directives. You may want to  check with your health care provider, attorney, or state representative about the laws in your state. There are different types of advance directives, such as:  Medical power of attorney.  Living will.  Do not resuscitate (DNR) or do not attempt resuscitation (DNAR) order. Health care proxy and medical power of attorney A health care proxy, also called a health care agent, is a person who is appointed to make medical decisions for you in cases in which you are unable to make the decisions yourself. Generally, people choose someone they know well and trust to represent their preferences. Make sure to ask this person for an agreement to act as your proxy. A proxy may have to exercise judgment in the event of a medical decision for which your wishes are not known. A medical power of attorney is a legal document that names your health care proxy. Depending on the laws in your state, after the document is written, it may also need to be:  Signed.  Notarized.  Dated.  Copied.  Witnessed.  Incorporated into your medical record. You may also want to appoint someone to manage your financial affairs in a situation in which you are unable to do so. This is called a durable power of attorney for finances. It is a separate legal document from the durable power of attorney for health care. You may choose the same person or someone different from your health care proxy to act as your agent in financial matters. If you do not appoint a proxy, or if there is a concern that the proxy is not acting in your best interests,  a court-appointed guardian may be designated to act on your behalf. Living will A living will is a set of instructions documenting your wishes about medical care when you cannot express them yourself. Health care providers should keep a copy of your living will in your medical record. You may want to give a copy to family members or friends. To alert caregivers in case of an emergency, you  can place a card in your wallet to let them know that you have a living will and where they can find it. A living will is used if you become:  Terminally ill.  Incapacitated.  Unable to communicate or make decisions. Items to consider in your living will include:  The use or non-use of life-sustaining equipment, such as dialysis machines and breathing machines (ventilators).  A DNR or DNAR order, which is the instruction not to use cardiopulmonary resuscitation (CPR) if breathing or heartbeat stops.  The use or non-use of tube feeding.  Withholding of food and fluids.  Comfort (palliative) care when the goal becomes comfort rather than a cure.  Organ and tissue donation. A living will does not give instructions for distributing your money and property if you should pass away. It is recommended that you seek the advice of a lawyer when writing a will. Decisions about taxes, beneficiaries, and asset distribution will be legally binding. This process can relieve your family and friends of any concerns surrounding disputes or questions that may come up about the distribution of your assets. DNR or DNAR A DNR or DNAR order is a request not to have CPR in the event that your heart stops beating or you stop breathing. If a DNR or DNAR order has not been made and shared, a health care provider will try to help any patient whose heart has stopped or who has stopped breathing. If you plan to have surgery, talk with your health care provider about how your DNR or DNAR order will be followed if problems occur. Summary  Advance directives are the legal documents that allow you to make choices ahead of time about your health care and medical treatment in case you become unable to communicate for yourself.  The process of discussing and writing advance directives should happen over time. You can change the advance directives, even after you have signed them.  Advance directives include DNR or DNAR  orders, living wills, and designating an agent as your medical power of attorney. This information is not intended to replace advice given to you by your health care provider. Make sure you discuss any questions you have with your health care provider. Document Released: 01/27/2008 Document Revised: 11/24/2018 Document Reviewed: 09/08/2016 Elsevier Patient Education  2020 [place] (COVID-19) Are you at risk?  Are you at risk for the Coronavirus (COVID-19)?  To be considered HIGH RISK for Coronavirus (COVID-19), you have to meet the following criteria:  . Traveled to [place], [place], [place], [place] or [place]; or in the [place] to [place], [place], [place], or [place]; and have fever, cough, and shortness of breath within the last 2 weeks of travel OR . Been in close contact with a person diagnosed with COVID-19 within the last 2 weeks and have fever, cough, and shortness of breath . IF YOU DO NOT MEET THESE CRITERIA, YOU ARE CONSIDERED LOW RISK FOR COVID-19.  What to do if you are HIGH RISK for COVID-19?  Marland Kitchen If you are having a medical emergency, call  911. . Seek medical care right away. Before you go to a doctor's office, urgent care or emergency department, call ahead and tell them about your recent travel, contact with someone diagnosed with COVID-19, and your symptoms. You should receive instructions from your physician's office regarding next steps of care.  . When you arrive at healthcare provider, tell the healthcare staff immediately you have returned from visiting [place], [place], [place], [place] or [place]; or traveled in the [place] to [place], [place], [place], or [place]; in the last two weeks or you have been in close contact with a person diagnosed with COVID-19 in the last 2 weeks.   . Tell the health care staff about your symptoms: fever, cough and shortness of breath. . After you have been seen by a medical provider, you will be  either: o Tested for (COVID-19) and discharged home on quarantine except to seek medical care if symptoms worsen, and asked to  - Stay home and avoid contact with others until you get your results (4-5 days)  - Avoid travel on public transportation if possible (such as bus, train, or airplane) or o Sent to the Emergency Department by EMS for evaluation, COVID-19 testing, and possible admission depending on your condition and test results.  What to do if you are LOW RISK for COVID-19?  Reduce your risk of any infection by using the same precautions used for avoiding the common cold or flu:  Marland Kitchen Wash your hands often with soap and warm water for at least 20 seconds.  If soap and water are not readily available, use an alcohol-based hand sanitizer with at least 60% alcohol.  . If coughing or sneezing, cover your mouth and nose by coughing or sneezing into the elbow areas of your shirt or coat, into a tissue or into your sleeve (not your hands). . Avoid shaking hands with others and consider head nods or verbal greetings only. . Avoid touching your eyes, nose, or mouth with unwashed hands.  . Avoid close contact with people who are sick. . Avoid places or events with large numbers of people in one location, like concerts or sporting events. . Carefully consider travel plans you have or are making. . If you are planning any travel outside or inside the [place], visit the CDC's Travelers' Health webpage for the latest health notices. . If you have some symptoms but not all symptoms, continue to monitor at home and seek medical attention if your symptoms worsen. . If you are having a medical emergency, call 911.   [place] / e-Visit: [email or web address]         [facility]: [facility]: [phone]                   [facility]: [phone]

## 2019-10-13 NOTE — Telephone Encounter (Signed)
Was filled by Dr. Alen Blew 11/24 in your absence. I am unable to complete/refuse this prescription

## 2019-10-14 ENCOUNTER — Inpatient Hospital Stay: Payer: Medicaid Other

## 2019-10-14 ENCOUNTER — Other Ambulatory Visit: Payer: Self-pay

## 2019-10-14 VITALS — BP 116/65 | HR 63 | Temp 98.3°F | Resp 18

## 2019-10-14 DIAGNOSIS — C787 Secondary malignant neoplasm of liver and intrahepatic bile duct: Secondary | ICD-10-CM

## 2019-10-14 DIAGNOSIS — C801 Malignant (primary) neoplasm, unspecified: Secondary | ICD-10-CM

## 2019-10-14 DIAGNOSIS — Z5111 Encounter for antineoplastic chemotherapy: Secondary | ICD-10-CM | POA: Diagnosis not present

## 2019-10-14 DIAGNOSIS — Z7189 Other specified counseling: Secondary | ICD-10-CM

## 2019-10-14 MED ORDER — HEPARIN SOD (PORK) LOCK FLUSH 100 UNIT/ML IV SOLN
500.0000 [IU] | Freq: Once | INTRAVENOUS | Status: AC | PRN
  Administered 2019-10-14: 500 [IU]
  Filled 2019-10-14: qty 5

## 2019-10-14 MED ORDER — SODIUM CHLORIDE 0.9 % IV SOLN
Freq: Once | INTRAVENOUS | Status: AC
  Administered 2019-10-14: 09:00:00 via INTRAVENOUS
  Filled 2019-10-14: qty 250

## 2019-10-14 MED ORDER — SODIUM CHLORIDE 0.9 % IV SOLN
80.0000 mg/m2 | Freq: Once | INTRAVENOUS | Status: AC
  Administered 2019-10-14: 150 mg via INTRAVENOUS
  Filled 2019-10-14: qty 7.5

## 2019-10-14 MED ORDER — DEXAMETHASONE SODIUM PHOSPHATE 10 MG/ML IJ SOLN
10.0000 mg | Freq: Once | INTRAMUSCULAR | Status: AC
  Administered 2019-10-14: 10 mg via INTRAVENOUS

## 2019-10-14 MED ORDER — DEXAMETHASONE SODIUM PHOSPHATE 10 MG/ML IJ SOLN
INTRAMUSCULAR | Status: AC
  Filled 2019-10-14: qty 1

## 2019-10-14 MED ORDER — SODIUM CHLORIDE 0.9% FLUSH
10.0000 mL | INTRAVENOUS | Status: DC | PRN
  Administered 2019-10-14: 10 mL
  Filled 2019-10-14: qty 10

## 2019-10-14 NOTE — Patient Instructions (Signed)
[facility] Discharge Instructions for Patients Receiving Chemotherapy  Today you received the following chemotherapy agents Etoposide (VEPESID).  To help prevent nausea and vomiting after your treatment, we encourage you to take your nausea medication as prescribed.   If you develop nausea and vomiting that is not controlled by your nausea medication, call the clinic.   BELOW ARE SYMPTOMS THAT SHOULD BE REPORTED IMMEDIATELY:  *FEVER GREATER THAN 100.5 F  *CHILLS WITH OR WITHOUT FEVER  NAUSEA AND VOMITING THAT IS NOT CONTROLLED WITH YOUR NAUSEA MEDICATION  *UNUSUAL SHORTNESS OF BREATH  *UNUSUAL BRUISING OR BLEEDING  TENDERNESS IN MOUTH AND THROAT WITH OR WITHOUT PRESENCE OF ULCERS  *URINARY PROBLEMS  *BOWEL PROBLEMS  UNUSUAL RASH Items with * indicate a potential emergency and should be followed up as soon as possible.  Feel free to call the clinic should you have any questions or concerns. The clinic phone number is [phone].  Please show the [facility] at check-in to the Emergency Department and triage nurse.  Coronavirus (COVID-19) Are you at risk?  Are you at risk for the Coronavirus (COVID-19)?  To be considered HIGH RISK for Coronavirus (COVID-19), you have to meet the following criteria:  . Traveled to [place], [place], [place], [place] or [place]; or in the [place] to [place], [place], [place], or [place]; and have fever, cough, and shortness of breath within the last 2 weeks of travel OR . Been in close contact with a person diagnosed with COVID-19 within the last 2 weeks and have fever, cough, and shortness of breath . IF YOU DO NOT MEET THESE CRITERIA, YOU ARE CONSIDERED LOW RISK FOR COVID-19.  What to do if you are HIGH RISK for COVID-19?  Marland Kitchen If you are having a medical emergency, call 911. . Seek medical care right away. Before you go to a doctor's office, urgent care or emergency department, call ahead and tell them  about your recent travel, contact with someone diagnosed with COVID-19, and your symptoms. You should receive instructions from your physician's office regarding next steps of care.  . When you arrive at healthcare provider, tell the healthcare staff immediately you have returned from visiting [place], [place], [place], [place] or [place]; or traveled in the [place] to [place], [place], [place], or [place]; in the last two weeks or you have been in close contact with a person diagnosed with COVID-19 in the last 2 weeks.   . Tell the health care staff about your symptoms: fever, cough and shortness of breath. . After you have been seen by a medical provider, you will be either: o Tested for (COVID-19) and discharged home on quarantine except to seek medical care if symptoms worsen, and asked to  - Stay home and avoid contact with others until you get your results (4-5 days)  - Avoid travel on public transportation if possible (such as bus, train, or airplane) or o Sent to the Emergency Department by EMS for evaluation, COVID-19 testing, and possible admission depending on your condition and test results.  What to do if you are LOW RISK for COVID-19?  Reduce your risk of any infection by using the same precautions used for avoiding the common cold or flu:  Marland Kitchen Wash your hands often with soap and warm water for at least 20 seconds.  If soap and water are not readily available, use an alcohol-based hand sanitizer with at least 60% alcohol.  . If coughing or  sneezing, cover your mouth and nose by coughing or sneezing into the elbow areas of your shirt or coat, into a tissue or into your sleeve (not your hands). . Avoid shaking hands with others and consider head nods or verbal greetings only. . Avoid touching your eyes, nose, or mouth with unwashed hands.  . Avoid close contact with people who are sick. . Avoid places or events with large numbers of people in one location, like concerts or  sporting events. . Carefully consider travel plans you have or are making. . If you are planning any travel outside or inside the [place], visit the CDC's Travelers' Health webpage for the latest health notices. . If you have some symptoms but not all symptoms, continue to monitor at home and seek medical attention if your symptoms worsen. . If you are having a medical emergency, call 911.   [place] / e-Visit: [email or web address]         [facility]: [facility]: [phone]                   [facility]: [phone]

## 2019-10-17 ENCOUNTER — Other Ambulatory Visit: Payer: Self-pay

## 2019-10-17 ENCOUNTER — Inpatient Hospital Stay: Payer: Medicaid Other

## 2019-10-17 VITALS — BP 118/62 | HR 62 | Temp 98.2°F | Resp 18

## 2019-10-17 DIAGNOSIS — Z7189 Other specified counseling: Secondary | ICD-10-CM

## 2019-10-17 DIAGNOSIS — Z5111 Encounter for antineoplastic chemotherapy: Secondary | ICD-10-CM | POA: Diagnosis not present

## 2019-10-17 DIAGNOSIS — C787 Secondary malignant neoplasm of liver and intrahepatic bile duct: Secondary | ICD-10-CM

## 2019-10-17 MED ORDER — PEGFILGRASTIM-JMDB 6 MG/0.6ML ~~LOC~~ SOSY
6.0000 mg | PREFILLED_SYRINGE | Freq: Once | SUBCUTANEOUS | Status: AC
  Administered 2019-10-17: 10:00:00 6 mg via SUBCUTANEOUS

## 2019-10-17 MED ORDER — PEGFILGRASTIM-JMDB 6 MG/0.6ML ~~LOC~~ SOSY
PREFILLED_SYRINGE | SUBCUTANEOUS | Status: AC
  Filled 2019-10-17: qty 0.6

## 2019-10-17 NOTE — Patient Instructions (Signed)

## 2019-10-18 ENCOUNTER — Other Ambulatory Visit: Payer: Self-pay | Admitting: *Deleted

## 2019-10-18 DIAGNOSIS — C787 Secondary malignant neoplasm of liver and intrahepatic bile duct: Secondary | ICD-10-CM

## 2019-10-18 NOTE — Progress Notes (Signed)
  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  HISTORY OF PRESENTING ILLNESS:   [name] is a wonderful 58 y.o. male who has been referred to us by Dr. Kathleen McManus for evaluation and management of Liver masses. He is accompanied today by his wife and aunt. The pt reports that he is doing well overall.   The pt presented to the ED on 10/01/18 regarding persistent RUQ abdominal pain which began a month prior, and noted associations with nausea and 40 pound weight loss. He was evaluated with a CT A/P, as noted below, which revealed liver masses.   The pt reports that he first began feeling RUQ abdominal pain about 3-4 weeks ago, but began losing weight about 2 months ago. He notes that he began feeling more fatigue about 2 months ago as well.  The pt notes that the color of his bowels became dark black and denies taking Iron pills or bismuth medications. He notes that he began feeling constipated as well, feeling backed up. He notes that his stools began looking thinner in caliber. The pt notes that his appetite began decreasing over the last 3-4 weeks ago, and some of this he attributes to intermittent nausea. He denies problems swallowing food. He endorses RUQ pain, and denies vomiting and new cough, new bone pains, testicular pain or swelling, problems passing urine, blood in the urine. The pt has never had a colonoscopy and does not have a PCP, and cites his lack of health insurance as the reason.  The pt was released from the ED with Oxycodone and has been using this to treat his RUQ pain successfully.   The pt notes gout and a heart murmur as previous medical history. He notes that he hasn't consumed alcohol in the past 2 years whatsoever, but prior to this alcohol was a problem for  him.   Of note prior to the patient's visit today, pt has had a CT A/P completed on 10/01/18 with results revealing Interval development of numerous ill-defined hypodense masses throughout the entire liver compatible with metastatic disease. There is a 2.5 cm enhancing mass over the upper pole cortex of the right kidney suspicious for renal cell carcinoma. 2.  No acute findings in the abdomen/pelvis. 3. 1 cm hypodensity over the pancreatic tail not well seen on previous noncontrast exams. MR may be helpful for further evaluation on an elective basis.   Most recent lab results (10/06/18) of CBC w/diff and CMP is as follows: all values are WNL except for Monocytes abs at 1.2k, Sodium at 134, Albumin at 3.0, AST at 77, ALT at 63, Alk Phos at 392, Total Bilirubin at 2.3   On review of systems, pt reports new constipation, smaller caliber stools, dark stools, unexpected weight loss, RUQ pain, new fatigue, and denies vomiting, cough, new SOB, changes in breathing, new bone pains, leg swelling, blood in the urine, changes in urination, fevers, pain along the spine, and any other symptoms.   On PMHx the pt reports Gout, heart murmur, previous alcohol abuse. He denies any surgeries.  On Social Hx the pt reports that he hasn't consumed alcohol in the past 2 years whatsoever. He notes that alcohol abuse was previously a concern. He denies ever smoking cigarettes or doing drugs. He denies any radiation or chemical exposure.  On Family Hx   the pt reports mother with pancreatic cancer in 60s, maternal aunts with high blood pressure and heart failure, maternal aunt with breast cancer.   INTERVAL HISTORY:  [name] returns today for management, evaluation of his recently diagnosed High grade neuroendocrine carcinoma. He is her for a toxicity check after C1D1 of Carboplatin & Etoposide. We are joined today by his cousin via phone. The patient's last visit with us was on 10/04/2019. The pt reports that he is doing  well overall.  The pt reports that he does not feel much different at this time. He denies any fatigue and is eating well. He has also been using Boost supplements to help with his caloric needs. Pt has been making an effort to drink more water since our last visit. Pt has has some bone pains since the Neulasta shot but nothing that's bothersome. He also has passing abdominal pains, as well as occasional constipation. Pt wants to be more active but notes that the weather has been limiting. He felt better when he was able to go outside and move around more.   Lab results today (10/19/19) of CBC w/diff and CMP is as follows: all values are WNL except for WBC at 19.9K, RBC at 3.94, Hgb at 10.8, HCT at 33.3, PLT at 83K, Neutro Abs at 18.1K, Mono Abs at 0.0K, Abs Immature Granulocytes at 0.40K, Glucose at 108, Calcium at 8.6, Albumin at 3.1, AST at 14, Alkaline Phosphatase at 127.   On review of systems, pt reports eating well, bone pain, intermittent abdominal pain, occasional constipation and denies bowel movement issues, fatigue and any other symptoms.   MEDICAL HISTORY:  Past Medical History:  Diagnosis Date  . Arthritis    "knees; left hand"  . Cancer (HCC)   . Gout   . Heart murmur    Longstanding. 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 Hypertension   . Near syncope    05/2011 with tachypalpitations & with echo showing mild LVH, normal EF 55-60%, trivial AR    SURGICAL HISTORY: Past Surgical History:  Procedure Laterality Date  . IR IMAGING GUIDED PORT INSERTION  10/26/2018  . NO PAST SURGERIES      SOCIAL HISTORY: Social History   Socioeconomic History  . Marital status: Married    Spouse name: Not on file  . Number of children: 1  . Years of education: Not on file  . Highest education level: Not on file  Occupational History  . Occupation: Unemployed  Tobacco Use  . Smoking status: Never Smoker  . Smokeless tobacco: Never Used  Substance and Sexual  Activity  . Alcohol use: Not Currently    Comment: states he has cut back/ every now and again  . Drug use: No  . Sexual activity: Yes    Partners: Female  Other Topics Concern  . Not on file  Social History Narrative   Married. 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 Is from [place].    Social Determinants of Health   Financial Resource Strain:   . Difficulty of Paying Living Expenses: Not on file  Food Insecurity:   . Worried About Running Out of Food in the Last Year: Not on file  . Ran Out of Food in the Last Year: Not on file  Transportation Needs:   . Lack of Transportation (Medical): Not on file  . Lack of Transportation (Non-Medical): Not on file  Physical Activity:   .   Days of Exercise per Week: Not on file  . Minutes of Exercise per Session: Not on file  Stress:   . Feeling of Stress : Not on file  Social Connections:   . Frequency of Communication with Friends and Family: Not on file  . Frequency of Social Gatherings with Friends and Family: Not on file  . Attends Religious Services: Not on file  . Active Member of Clubs or Organizations: Not on file  . Attends Archivist Meetings: Not on file  . Marital Status: Not on file  Intimate Partner Violence:   . Fear of Current or Ex-Partner: Not on file  . Emotionally Abused: Not on file  . Physically Abused: Not on file  . Sexually Abused: Not on file    FAMILY HISTORY: Family History  Problem Relation Age of Onset  . Cancer Mother        Possibly pancreatic. Died at age 26  . Prostate cancer Father   . Breast cancer Cousin   . Diabetes Paternal Uncle   . Heart disease Paternal Uncle     ALLERGIES:  has No Known Allergies.  MEDICATIONS:  Current Outpatient Medications  Medication Sig Dispense Refill  . allopurinol (ZYLOPRIM) 100 MG tablet Take 2 tablets (200 mg total) by mouth daily. 60 tablet 1  . clindamycin (CLEOCIN) 300 MG capsule Take 1 capsule  (300 mg total) by mouth 4 (four) times daily. X 7 days 28 capsule 0  . colchicine 0.6 MG tablet Take 1 tablet (0.6 mg total) by mouth See admin instructions. Take 1.71m (2 tablets) followed by .62m(1 tablet) 1 hour later. 3 tablet 0  . dexamethasone (DECADRON) 4 MG tablet 41m56mo daily with breakfast for 2 weeks then every other day for 2 weeks 30 tablet 0  . dexamethasone (DECADRON) 4 MG tablet Take 2 tablets (8 mg total) by mouth daily. Start the day after chemotherapy for 1 day. 30 tablet 1  . furosemide (LASIX) 20 MG tablet Take 1 tablet (20 mg total) by mouth daily. 30 tablet 1  . lidocaine-prilocaine (EMLA) cream Apply to affected area once 30 g 3  . lidocaine-prilocaine (EMLA) cream Apply to affected area once 30 g 3  . LORazepam (ATIVAN) 0.5 MG tablet Take 1 tablet (0.5 mg total) by mouth every 6 (six) hours as needed (Nausea or vomiting). 30 tablet 0  . ondansetron (ZOFRAN ODT) 4 MG disintegrating tablet Take 1 tablet (4 mg total) by mouth every 8 (eight) hours as needed for nausea or vomiting. 6 tablet 0  . ondansetron (ZOFRAN) 8 MG tablet Take 1 tablet (8 mg total) by mouth 2 (two) times daily as needed for refractory nausea / vomiting. Start on day 3 after carboplatin chemo. 30 tablet 1  . oxyCODONE (OXY IR/ROXICODONE) 5 MG immediate release tablet Take 1-2 tablets (5-10 mg total) by mouth every 4 (four) hours as needed for severe pain. 120 tablet 0  . potassium chloride SA (K-DUR,KLOR-CON) 20 MEQ tablet Take 1 tablet (20 mEq total) by mouth daily. With lasix 30 tablet 1  . prochlorperazine (COMPAZINE) 10 MG tablet Take 1 tablet (10 mg total) by mouth every 6 (six) hours as needed (Nausea or vomiting). 30 tablet 1  . senna-docusate (SENNA S) 8.6-50 MG tablet Take 2 tablets by mouth at bedtime. 60 tablet 1   No current facility-administered medications for this visit.   Facility-Administered Medications Ordered in Other Visits  Medication Dose Route Frequency Provider Last Rate Last  Admin  .  denosumab (XGEVA) injection 120 mg  120 mg Subcutaneous Once Brunetta Genera, MD        REVIEW OF SYSTEMS:   A 10+ POINT REVIEW OF SYSTEMS WAS OBTAINED including neurology, dermatology, psychiatry, cardiac, respiratory, lymph, extremities, GI, GU, Musculoskeletal, constitutional, breasts, reproductive, HEENT.  All pertinent positives are noted in the HPI.  All others are negative.   PHYSICAL EXAMINATION: ECOG FS:2 - Symptomatic, <50% confined to bed   Vitals:   10/19/19 0859  BP: 130/63  Pulse: 70  Resp: 18  Temp: 98.5 F (36.9 C)  SpO2: 100%   Wt Readings from Last 3 Encounters:  10/19/19 155 lb (70.3 kg)  10/12/19 156 lb 12 oz (71.1 kg)  10/04/19 154 lb 6.4 oz (70 kg)   Body mass index is 24.28 kg/m.    GENERAL:alert, in no acute distress and comfortable SKIN: no acute rashes, no significant lesions EYES: conjunctiva are pink and non-injected, sclera anicteric OROPHARYNX: MMM, no exudates, no oropharyngeal erythema or ulceration NECK: supple, no JVD LYMPH:  no palpable lymphadenopathy in the cervical, axillary or inguinal regions LUNGS: clear to auscultation b/l with normal respiratory effort HEART: regular rate & rhythm ABDOMEN:  normoactive bowel sounds , non tender, not distended. No palpable splenomegaly. Hepatomegaly 3 finger breadths below the costal margin on the right side. Extremity: no pedal edema PSYCH: alert & oriented x 3 with fluent speech NEURO: no focal motor/sensory deficits  LABORATORY DATA:  I have reviewed the data as listed  . CBC Latest Ref Rng & Units 10/19/2019 10/12/2019 10/04/2019  WBC 4.0 - 10.5 K/uL 19.9(H) 4.6 4.4  Hemoglobin 13.0 - 17.0 g/dL 10.8(L) 11.9(L) 12.5(L)  Hematocrit 39.0 - 52.0 % 33.3(L) 37.1(L) 39.1  Platelets 150 - 400 K/uL 83(L) 150 106(L)   CMP Latest Ref Rng & Units 10/19/2019 10/12/2019 10/04/2019  Glucose 70 - 99 mg/dL 108(H) 116(H) 117(H)  BUN 6 - 20 mg/dL 18 16 28(H)  Creatinine 0.61 - 1.24 mg/dL 0.85  1.07 1.40(H)  Sodium 135 - 145 mmol/L 138 136 133(L)  Potassium 3.5 - 5.1 mmol/L 4.4 4.4 4.9  Chloride 98 - 111 mmol/L 106 104 99  CO2 22 - 32 mmol/L _0 Calcium 8.9 - 10.3 mg/dL 8.6(L) 8.9 9.3  Total Protein 6.5 - 8.1 g/dL 7.4 8.5(H) 9.6(H)  Total Bilirubin 0.3 - 1.2 mg/dL 0.5 0.7 0.9  Alkaline Phos 38 - 126 U/L 127(H) 165(H) 169(H)  AST 15 - 41 U/L 14(L) 23 35  ALT 0 - 44 U/L _1 Lab Results  Component Value Date   LDH 152 10/12/2019    03/31/2019 Mismatch Repair Protein Report   10/08/18 Liver Biopsy:    RADIOGRAPHIC STUDIES: I have personally reviewed the radiological images as listed and agreed with the findings in the report. DG Chest 2 View  Result Date: 09/26/2019 CLINICAL DATA:  Chest pain and shortness of breath EXAM: CHEST - 2 VIEW COMPARISON:  May 10, 2019 FINDINGS: Right chest wall port catheter tip is unchanged in position. Stable elevation of the right hemidiaphragm. No new consolidation or edema. No pleural effusion or pneumothorax. Cardiomediastinal contours are within normal limits. IMPRESSION: No acute process in the chest. Electronically Signed   By: Macy Mis M.D.   On: 09/26/2019 10:11   CT Chest W Contrast  Result Date: 10/03/2019 CLINICAL DATA:  Restaging metastatic small cell neuroendocrine tumor EXAM: CT CHEST, ABDOMEN, AND PELVIS WITH CONTRAST TECHNIQUE: Multidetector CT imaging of the chest, abdomen and  pelvis was performed following the standard protocol during bolus administration of intravenous contrast. CONTRAST:  100mL OMNIPAQUE IOHEXOL 300 MG/ML  SOLN COMPARISON:  Multiple exams, including CT examinations of 07/27/2019 09/26/2019 FINDINGS: CT CHEST FINDINGS Cardiovascular: Right Port-A-Cath tip: Right atrium. Mild atherosclerotic calcification of the aortic branch vessels. Upper normal heart size. Mediastinum/Nodes: Unremarkable Lungs/Pleura: Stable 3 mm right upper lobe nodule on image 46/5. Increasing conspicuity of the 3 mm right  upper lobe nodule on image 51/5, previously present but less solid appearing. There is atelectasis or scarring along both hemidiaphragms. Mild scarring in the superior segment right lower lobe. Musculoskeletal: Stable widespread faintly sclerotic osseous metastatic disease, no appreciable change or progression compared to 07/27/2019. CT ABDOMEN PELVIS FINDINGS Hepatobiliary: Heavy burden of metastatic disease to the liver causing a pseudo cirrhosis appearance. It is difficult to measure individual lesions because they are confluent, but there is felt to be overall progression and increasing hepatomegaly, craniocaudad extent of the liver 24.7 cm, previously 20.0 cm. Gallbladder grossly unremarkable. Pancreas: Punctate calcification in the tail the pancreas. Poorly defined 1.5 by 0.8 cm hypodense lesion in the tail the pancreas appears roughly stable, significance uncertain. Spleen: Scattered calcifications compatible with old granulomatous disease. Adrenals/Urinary Tract: The lateral limb of the left adrenal gland measures 1.0 cm in thickness, previously 0.8 cm. This expansion is of uncertain significance but could be an indicator of a early metastatic lesion. On coronal images, a 2.3 by 1.3 cm lesion of the right kidney upper pole is present, suspicious for a small renal cell carcinoma or perirenal tumor, previously measuring 2.0 by 1.3 cm. Stomach/Bowel: Prominent stool throughout the colon favors constipation. Vascular/Lymphatic: Aortoiliac atherosclerotic vascular disease. There are narrowings of some of the intrahepatic portions of the portal vein without visible thrombosis. Right gastric and left gastric collateral vessels noted. Reproductive: Unremarkable Other: Mild ascites in the perihepatic region and pelvis Musculoskeletal: Stable pattern of faintly sclerotic widespread osseous metastatic disease. Lumbar impingement at L3-4, L4-5, and L5-S1 due to spondylosis and degenerative disc disease. IMPRESSION: 1.  Increasing metastatic burden in the liver. Although individual masses are difficult to measure due to the diffuse nature of the hepatic metastatic disease, there is overall increase in size of the hepatomegaly associated with the diffuse widespread tumor involvement of the liver. 2. A nodule anteriorly in the right upper lobe measures only 3 mm in diameter but appears more solid than on prior exams, and could possibly be metastatic. 3. Mild thickening of the left adrenal gland, increased from prior, early metastatic lesion not excluded. 4. Mild and mildly increased ascites. 5. Perigastric collateral vessels indicating portal venous hypertension. 6. Stable widespread faintly sclerotic osseous metastatic disease, no progression or change. 7. Poorly defined 1.5 by 0.8 cm hypodensity in the tail the pancreas, roughly stable, favoring a small metastatic lesion. 8. Minimally enlarged enhancing lesion along the right kidney upper pole, potentially a small renal cell carcinoma or small metastatic lesion along the renal capsular margin. 9.  Prominent stool throughout the colon favors constipation. 10. Other imaging findings of potential clinical significance: Aortic Atherosclerosis (ICD10-I70.0). Multilevel lumbar impingement from spondylosis and degenerative disc disease. Electronically Signed   By: Walter  Liebkemann M.D.   On: 10/03/2019 15:31   CT Angio Chest PE W and/or Wo Contrast  Result Date: 09/26/2019 CLINICAL DATA:  Metastatic neuroendocrine carcinoma chest pain and shortness of breath EXAM: CT ANGIOGRAPHY CHEST WITH CONTRAST TECHNIQUE: Multidetector CT imaging of the chest was performed using the standard protocol during bolus administration of intravenous   contrast. Multiplanar CT image reconstructions and MIPs were obtained to evaluate the vascular anatomy. CONTRAST:  80mL OMNIPAQUE IOHEXOL 350 MG/ML SOLN COMPARISON:  CT angiogram chest July 27, 2019 FINDINGS: Cardiovascular: There is no demonstrable  pulmonary embolus. There is no appreciable thoracic aortic aneurysm or dissection. Visualized great vessels appear unremarkable. There is no appreciable pericardial thickening. There is a rather minimal pericardial effusion, likely within physiologic range. Port-A-Cath tip is in the superior vena cava. There are foci of coronary artery calcification. Mediastinum/Nodes: Thyroid appears unremarkable. There is no evident thoracic adenopathy. No esophageal lesions are evident. Lungs/Pleura: There is mild scarring and atelectasis in the lung bases. There is no edema or consolidation. No pleural effusions or pleural thickening evident. Upper Abdomen: For contour remains somewhat nodular. There are areas of decreased attenuation throughout multiple sites in the liver, similar to prior study, consistent with hepatic metastatic disease. Lesions are better delineated on venous phase imaging on prior study compared to arterial phase imaging currently. Visualized upper abdominal structures appear otherwise unremarkable. Musculoskeletal: There are multiple sclerotic bony lesions throughout the thoracic region with the largest individual lesion at T12. No lytic or destructive bone lesions. Port noted anteriorly on the right. Review of the MIP images confirms the above findings. IMPRESSION: 1. No demonstrable pulmonary embolus. No thoracic aortic aneurysm or dissection. There are foci of coronary artery calcification. 2. Areas of mild scarring and atelectasis in the lung base regions. Lungs otherwise clear. 3.  No adenopathy evident. 4. Multiple liver metastases, better seen on venous phase imaging. Liver has a nodular contour consistent with a degree of underlying cirrhosis. 5.  Extensive sclerotic bony metastases. 6. Rather minimal pericardial effusion which may be within physiologic range. Electronically Signed   By: William  Woodruff III M.D.   On: 09/26/2019 11:54   CT Abdomen Pelvis W Contrast  Result Date:  10/03/2019 CLINICAL DATA:  Restaging metastatic small cell neuroendocrine tumor EXAM: CT CHEST, ABDOMEN, AND PELVIS WITH CONTRAST TECHNIQUE: Multidetector CT imaging of the chest, abdomen and pelvis was performed following the standard protocol during bolus administration of intravenous contrast. CONTRAST:  100mL OMNIPAQUE IOHEXOL 300 MG/ML  SOLN COMPARISON:  Multiple exams, including CT examinations of 07/27/2019 09/26/2019 FINDINGS: CT CHEST FINDINGS Cardiovascular: Right Port-A-Cath tip: Right atrium. Mild atherosclerotic calcification of the aortic branch vessels. Upper normal heart size. Mediastinum/Nodes: Unremarkable Lungs/Pleura: Stable 3 mm right upper lobe nodule on image 46/5. Increasing conspicuity of the 3 mm right upper lobe nodule on image 51/5, previously present but less solid appearing. There is atelectasis or scarring along both hemidiaphragms. Mild scarring in the superior segment right lower lobe. Musculoskeletal: Stable widespread faintly sclerotic osseous metastatic disease, no appreciable change or progression compared to 07/27/2019. CT ABDOMEN PELVIS FINDINGS Hepatobiliary: Heavy burden of metastatic disease to the liver causing a pseudo cirrhosis appearance. It is difficult to measure individual lesions because they are confluent, but there is felt to be overall progression and increasing hepatomegaly, craniocaudad extent of the liver 24.7 cm, previously 20.0 cm. Gallbladder grossly unremarkable. Pancreas: Punctate calcification in the tail the pancreas. Poorly defined 1.5 by 0.8 cm hypodense lesion in the tail the pancreas appears roughly stable, significance uncertain. Spleen: Scattered calcifications compatible with old granulomatous disease. Adrenals/Urinary Tract: The lateral limb of the left adrenal gland measures 1.0 cm in thickness, previously 0.8 cm. This expansion is of uncertain significance but could be an indicator of a early metastatic lesion. On coronal images, a 2.3 by 1.3  cm lesion of the right kidney   upper pole is present, suspicious for a small renal cell carcinoma or perirenal tumor, previously measuring 2.0 by 1.3 cm. Stomach/Bowel: Prominent stool throughout the colon favors constipation. Vascular/Lymphatic: Aortoiliac atherosclerotic vascular disease. There are narrowings of some of the intrahepatic portions of the portal vein without visible thrombosis. Right gastric and left gastric collateral vessels noted. Reproductive: Unremarkable Other: Mild ascites in the perihepatic region and pelvis Musculoskeletal: Stable pattern of faintly sclerotic widespread osseous metastatic disease. Lumbar impingement at L3-4, L4-5, and L5-S1 due to spondylosis and degenerative disc disease. IMPRESSION: 1. Increasing metastatic burden in the liver. Although individual masses are difficult to measure due to the diffuse nature of the hepatic metastatic disease, there is overall increase in size of the hepatomegaly associated with the diffuse widespread tumor involvement of the liver. 2. A nodule anteriorly in the right upper lobe measures only 3 mm in diameter but appears more solid than on prior exams, and could possibly be metastatic. 3. Mild thickening of the left adrenal gland, increased from prior, early metastatic lesion not excluded. 4. Mild and mildly increased ascites. 5. Perigastric collateral vessels indicating portal venous hypertension. 6. Stable widespread faintly sclerotic osseous metastatic disease, no progression or change. 7. Poorly defined 1.5 by 0.8 cm hypodensity in the tail the pancreas, roughly stable, favoring a small metastatic lesion. 8. Minimally enlarged enhancing lesion along the right kidney upper pole, potentially a small renal cell carcinoma or small metastatic lesion along the renal capsular margin. 9.  Prominent stool throughout the colon favors constipation. 10. Other imaging findings of potential clinical significance: Aortic Atherosclerosis (ICD10-I70.0).  Multilevel lumbar impingement from spondylosis and degenerative disc disease. Electronically Signed   By: Van Clines M.D.   On: 10/03/2019 15:31    ASSESSMENT & PLAN:  58 y.o. male with  1. 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 There is a 2.5 cm enhancing mass over the upper pole cortex of the right kidney suspicious for renal cell carcinoma. 2.  No acute findings in the abdomen/pelvis. 3. 1 cm hypodensity over the pancreatic tail not well seen on previous noncontrast exams. MR may be helpful for further evaluation on an elective basis.    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 2. Upper pole right renal lesion is not definitely hypermetabolic and has been present since 2012. I think is unlikely the cause of the metastatic disease. Recommend liver biopsy for tissue diagnosis. If needed, an MRI of the abdomen without and with contrast may be helpful for further evaluation of the renal lesion. 3. Two sub 3 mm right upper lobe pulmonary nodules are indeterminate.   10/26/18 MRI Brain revealed No acute abnormality and negative for metastatic disease.  03/15/19 CT C/A/P which revealed "Significant improvement in multifocal liver metastasis. 2. Interval development of multifocal areas of sclerosis within the visualized axial and proximal appendicular skeleton. These are favored to represent areas of healing bone metastases. 3. Stable lesion within distal tail of pancreas. 4. Aortic Atherosclerosis."   06/19/2019 CT  abdomen and pelvis with results revealing "Widespread hepatic metastasis. Individual lesions are difficult to measure however the  overall size of the liver is increased significantly and therefore findings are consistent with progression of hepatic metastasis compared to CT of 03/15/2019). No bowel obstruction. No abdominopelvic lymphadenopathy. Stable enhancing small RIGHT renal mass. Stable sclerotic skeletal metastasis."  07/27/2019 CT of the chest abdomen and pelvis with results revealing "Redemonstrated hepatomegaly with numerous bulky hypodense metastatic lesions, generally ill-defined and not significantly changed in size compared to prior examination, a partially exophytic index lesion of the anterior right lobe of the liver measuring 3.3 cm, previously 3.3 cm (series 2, image 58), an index lesion of the lateral right lobe of the liver measuring 5.6 cm, previously 5.6 cm (series 2, image 56), an index lesion of the anterior left lobe of the liver measuring 4.3 cm, previously 4.5 cm (series 2, image 50). Unchanged sclerotic osseous lesions, including of the T12 vertebral body, L4 vertebral body and bilateral proximal femurs (series 4, image 94, 85, 67, 70). Findings are consistent with unchanged metastatic disease without new evidence of metastasis in the chest, abdomen, or pelvis. There is a redemonstrated, exophytic small mass of the superior pole of the right kidney, best appreciated on coronal series measuring approximately 1.9 x 1.4 cm (series 4, image 87), not significantly changed compared to prior examinations and remain concerning for a small incidental renal cell carcinoma. Aortic Atherosclerosis (ICD10-I70.0) and Emphysema (ICD10-J43.9)."    S/p 6 cycles of Carboplatin and 20% dose reduced Etoposide completed on 02/19/19. Dose reduced as pt developed significant thrombocytopenia after C1.  10/03/2019 CT C/A/P ([id number]) ([id number]) shows disease progression in the liver and a minimal  enlargement of a kidney lesion  2. Abnormal LFTs - likely from liver metastases  Labs upon initial presentation from 10/06/18, some borderline monocytosis at 1.2k, otherwise normal blood counts, AST at 77, ALT at 63, Alk Phos at 392, Total Bilirubin at 2.3   3. Rt renal mass -  seen on 10/15/18 PET/CT but no longer observed on 03/15/19 CT C/A/P  4.Scattered osseous metastases-  07/27/2019 whole body bone scan with results revealing Scattered osseous metastases  as above, including proximal LEFT humerus, RIGHT humeral diaphysis, and BILATERAL femoral diaphyses.    PLAN: -Discussed pt labwork today, 10/19/19; anemia, PLT are low, ALKP is low, liver enzymes are stable  -Will continue to monitor PLTs -Plan to repeat scans after cycle 3 -The pt has no prohibitive toxicities from continuing C2D1 Carboplatin & Etoposide at this time.  -Recommended that the pt continue to eat well, drink at least 48-64 oz of water each day, and walk 20-30 minutes each day.  -Will get labs in 1 week -Will move C2D1 to 11/07/2019 with Xgeva    FOLLOW UP: Labs in 1 week Please schedule C2 of chemotherapy with labs and MD visit on 11/07/2019 (per patients preference- instead of 12/30) -Can cancel Xgeva scheduled for 12/18 and move to 11/07/2019 with chemotherapy. Changing Xgeva to q6weeks with every other chemotherapy   The total time spent in the appt was 20 minutes and more than 50% was on counseling and direct patient cares.  All of the patient's questions were answered with apparent satisfaction. The patient knows to call the clinic with any problems, questions or concerns.   Gautam Kale MD MS AAHIVMS [facility] Hematology/Oncology Physician [facility]  (Office):       [phone] (Work cell):  [phone] (Fax):           [phone]  10/19/2019 9:44 AM  I, Jazzmine Knight, am acting as a scribe for   Dr. Sullivan Lone.   .I have reviewed the above documentation for accuracy and completeness,  and I agree with the above. Brunetta Genera MD

## 2019-10-19 ENCOUNTER — Telehealth: Payer: Self-pay | Admitting: Hematology

## 2019-10-19 ENCOUNTER — Inpatient Hospital Stay: Payer: Medicaid Other

## 2019-10-19 ENCOUNTER — Inpatient Hospital Stay (HOSPITAL_BASED_OUTPATIENT_CLINIC_OR_DEPARTMENT_OTHER): Payer: Medicaid Other | Admitting: Hematology

## 2019-10-19 ENCOUNTER — Other Ambulatory Visit: Payer: Self-pay

## 2019-10-19 VITALS — BP 130/63 | HR 70 | Temp 98.5°F | Resp 18 | Wt 155.0 lb

## 2019-10-19 DIAGNOSIS — C801 Malignant (primary) neoplasm, unspecified: Secondary | ICD-10-CM

## 2019-10-19 DIAGNOSIS — C787 Secondary malignant neoplasm of liver and intrahepatic bile duct: Secondary | ICD-10-CM

## 2019-10-19 DIAGNOSIS — C7951 Secondary malignant neoplasm of bone: Secondary | ICD-10-CM | POA: Diagnosis not present

## 2019-10-19 DIAGNOSIS — Z5111 Encounter for antineoplastic chemotherapy: Secondary | ICD-10-CM | POA: Diagnosis not present

## 2019-10-19 LAB — CMP (CANCER CENTER ONLY)
ALT: 11 U/L (ref 0–44)
AST: 14 U/L — ABNORMAL LOW (ref 15–41)
Albumin: 3.1 g/dL — ABNORMAL LOW (ref 3.5–5.0)
Alkaline Phosphatase: 127 U/L — ABNORMAL HIGH (ref 38–126)
Anion gap: 8 (ref 5–15)
BUN: 18 mg/dL (ref 6–20)
CO2: 24 mmol/L (ref 22–32)
Calcium: 8.6 mg/dL — ABNORMAL LOW (ref 8.9–10.3)
Chloride: 106 mmol/L (ref 98–111)
Creatinine: 0.85 mg/dL (ref 0.61–1.24)
GFR, Est AFR Am: >60 mL/min (ref >60)
GFR, Estimated: >60 mL/min (ref >60)
Glucose, Bld: 108 mg/dL — ABNORMAL HIGH (ref 70–99)
Potassium: 4.4 mmol/L (ref 3.5–5.1)
Sodium: 138 mmol/L (ref 135–145)
Total Bilirubin: 0.5 mg/dL (ref 0.3–1.2)
Total Protein: 7.4 g/dL (ref 6.5–8.1)

## 2019-10-19 LAB — CBC WITH DIFFERENTIAL (CANCER CENTER ONLY)
Abs Immature Granulocytes: 0.4 10*3/uL — ABNORMAL HIGH (ref 0.00–0.07)
Band Neutrophils: 6 %
Basophils Absolute: 0 10*3/uL (ref 0.0–0.1)
Basophils Relative: 0 %
Eosinophils Absolute: 0 10*3/uL (ref 0.0–0.5)
Eosinophils Relative: 0 %
HCT: 33.3 % — ABNORMAL LOW (ref 39.0–52.0)
Hemoglobin: 10.8 g/dL — ABNORMAL LOW (ref 13.0–17.0)
Lymphocytes Relative: 7 %
Lymphs Abs: 1.4 10*3/uL (ref 0.7–4.0)
MCH: 27.4 pg (ref 26.0–34.0)
MCHC: 32.4 g/dL (ref 30.0–36.0)
MCV: 84.5 fL (ref 80.0–100.0)
Metamyelocytes Relative: 2 %
Monocytes Absolute: 0 10*3/uL — ABNORMAL LOW (ref 0.1–1.0)
Monocytes Relative: 0 %
Neutro Abs: 18.1 10*3/uL — ABNORMAL HIGH (ref 1.7–7.7)
Neutrophils Relative %: 85 %
Platelet Count: 83 10*3/uL — ABNORMAL LOW (ref 150–400)
RBC: 3.94 MIL/uL — ABNORMAL LOW (ref 4.22–5.81)
RDW: 13.2 % (ref 11.5–15.5)
WBC Count: 19.9 10*3/uL — ABNORMAL HIGH (ref 4.0–10.5)
nRBC: 0 % (ref 0.0–0.2)

## 2019-10-19 NOTE — Telephone Encounter (Signed)
Scheduled appt per 12/16 los.  Printed calendar and avs.

## 2019-10-21 ENCOUNTER — Ambulatory Visit: Payer: Medicaid Other

## 2019-10-26 ENCOUNTER — Inpatient Hospital Stay: Payer: Medicaid Other

## 2019-10-26 ENCOUNTER — Other Ambulatory Visit: Payer: Self-pay

## 2019-10-26 DIAGNOSIS — C787 Secondary malignant neoplasm of liver and intrahepatic bile duct: Secondary | ICD-10-CM

## 2019-10-26 DIAGNOSIS — Z5111 Encounter for antineoplastic chemotherapy: Secondary | ICD-10-CM | POA: Diagnosis not present

## 2019-10-26 DIAGNOSIS — C7951 Secondary malignant neoplasm of bone: Secondary | ICD-10-CM

## 2019-10-26 LAB — CBC WITH DIFFERENTIAL/PLATELET
Abs Immature Granulocytes: 1.36 10*3/uL — ABNORMAL HIGH (ref 0.00–0.07)
Basophils Absolute: 0.1 10*3/uL (ref 0.0–0.1)
Basophils Relative: 1 %
Eosinophils Absolute: 0 10*3/uL (ref 0.0–0.5)
Eosinophils Relative: 0 %
HCT: 32.5 % — ABNORMAL LOW (ref 39.0–52.0)
Hemoglobin: 10.2 g/dL — ABNORMAL LOW (ref 13.0–17.0)
Immature Granulocytes: 12 %
Lymphocytes Relative: 15 %
Lymphs Abs: 1.7 10*3/uL (ref 0.7–4.0)
MCH: 27.2 pg (ref 26.0–34.0)
MCHC: 31.4 g/dL (ref 30.0–36.0)
MCV: 86.7 fL (ref 80.0–100.0)
Monocytes Absolute: 1.5 10*3/uL — ABNORMAL HIGH (ref 0.1–1.0)
Monocytes Relative: 13 %
Neutro Abs: 6.5 10*3/uL (ref 1.7–7.7)
Neutrophils Relative %: 59 %
Platelets: 51 10*3/uL — ABNORMAL LOW (ref 150–400)
RBC: 3.75 MIL/uL — ABNORMAL LOW (ref 4.22–5.81)
RDW: 13.2 % (ref 11.5–15.5)
WBC: 11.2 10*3/uL — ABNORMAL HIGH (ref 4.0–10.5)
nRBC: 0.2 % (ref 0.0–0.2)

## 2019-10-29 ENCOUNTER — Other Ambulatory Visit: Payer: Self-pay | Admitting: Oncology

## 2019-10-29 ENCOUNTER — Other Ambulatory Visit: Payer: Self-pay | Admitting: Hematology

## 2019-10-29 ENCOUNTER — Encounter: Payer: Self-pay | Admitting: Hematology

## 2019-10-29 DIAGNOSIS — C801 Malignant (primary) neoplasm, unspecified: Secondary | ICD-10-CM

## 2019-10-29 DIAGNOSIS — Z7189 Other specified counseling: Secondary | ICD-10-CM

## 2019-10-29 DIAGNOSIS — C787 Secondary malignant neoplasm of liver and intrahepatic bile duct: Secondary | ICD-10-CM

## 2019-10-31 ENCOUNTER — Other Ambulatory Visit: Payer: Self-pay | Admitting: Medical

## 2019-10-31 DIAGNOSIS — Z7189 Other specified counseling: Secondary | ICD-10-CM

## 2019-10-31 DIAGNOSIS — C787 Secondary malignant neoplasm of liver and intrahepatic bile duct: Secondary | ICD-10-CM

## 2019-10-31 MED ORDER — LORAZEPAM 0.5 MG PO TABS: 0.5000 mg | ORAL_TABLET | Freq: Four times a day (QID) | ORAL | 0 refills | Status: DC | PRN

## 2019-10-31 MED ORDER — OXYCODONE HCL 5 MG PO TABS: 5.0000 mg | ORAL_TABLET | ORAL | 0 refills | Status: DC | PRN

## 2019-11-07 ENCOUNTER — Encounter: Payer: Self-pay | Admitting: Hematology

## 2019-11-07 ENCOUNTER — Inpatient Hospital Stay: Payer: Medicaid Other | Attending: Hematology

## 2019-11-07 ENCOUNTER — Other Ambulatory Visit: Payer: Self-pay

## 2019-11-07 ENCOUNTER — Inpatient Hospital Stay: Payer: Medicaid Other

## 2019-11-07 ENCOUNTER — Inpatient Hospital Stay (HOSPITAL_BASED_OUTPATIENT_CLINIC_OR_DEPARTMENT_OTHER): Payer: Medicaid Other | Admitting: Hematology

## 2019-11-07 VITALS — BP 147/72 | HR 75 | Temp 98.1°F | Resp 18 | Ht 67.0 in | Wt 157.4 lb

## 2019-11-07 DIAGNOSIS — Z79899 Other long term (current) drug therapy: Secondary | ICD-10-CM | POA: Diagnosis not present

## 2019-11-07 DIAGNOSIS — R945 Abnormal results of liver function studies: Secondary | ICD-10-CM | POA: Insufficient documentation

## 2019-11-07 DIAGNOSIS — C787 Secondary malignant neoplasm of liver and intrahepatic bile duct: Secondary | ICD-10-CM

## 2019-11-07 DIAGNOSIS — Z7689 Persons encountering health services in other specified circumstances: Secondary | ICD-10-CM | POA: Insufficient documentation

## 2019-11-07 DIAGNOSIS — C7B02 Secondary carcinoid tumors of liver: Secondary | ICD-10-CM | POA: Diagnosis not present

## 2019-11-07 DIAGNOSIS — C7A1 Malignant poorly differentiated neuroendocrine tumors: Secondary | ICD-10-CM | POA: Insufficient documentation

## 2019-11-07 DIAGNOSIS — Z5111 Encounter for antineoplastic chemotherapy: Secondary | ICD-10-CM | POA: Insufficient documentation

## 2019-11-07 DIAGNOSIS — C801 Malignant (primary) neoplasm, unspecified: Secondary | ICD-10-CM | POA: Diagnosis not present

## 2019-11-07 DIAGNOSIS — Z7189 Other specified counseling: Secondary | ICD-10-CM

## 2019-11-07 DIAGNOSIS — C7B03 Secondary carcinoid tumors of bone: Secondary | ICD-10-CM | POA: Insufficient documentation

## 2019-11-07 DIAGNOSIS — C7951 Secondary malignant neoplasm of bone: Secondary | ICD-10-CM | POA: Diagnosis not present

## 2019-11-07 LAB — CMP (CANCER CENTER ONLY)
ALT: 13 U/L (ref 0–44)
AST: 19 U/L (ref 15–41)
Albumin: 3.6 g/dL (ref 3.5–5.0)
Alkaline Phosphatase: 134 U/L — ABNORMAL HIGH (ref 38–126)
Anion gap: 8 (ref 5–15)
BUN: 14 mg/dL (ref 6–20)
CO2: 25 mmol/L (ref 22–32)
Calcium: 9 mg/dL (ref 8.9–10.3)
Chloride: 104 mmol/L (ref 98–111)
Creatinine: 1.01 mg/dL (ref 0.61–1.24)
GFR, Est AFR Am: >60 mL/min (ref >60)
GFR, Estimated: >60 mL/min (ref >60)
Glucose, Bld: 141 mg/dL — ABNORMAL HIGH (ref 70–99)
Potassium: 4.2 mmol/L (ref 3.5–5.1)
Sodium: 137 mmol/L (ref 135–145)
Total Bilirubin: 0.5 mg/dL (ref 0.3–1.2)
Total Protein: 8.2 g/dL — ABNORMAL HIGH (ref 6.5–8.1)

## 2019-11-07 LAB — CBC WITH DIFFERENTIAL/PLATELET
Abs Immature Granulocytes: 0.12 10*3/uL — ABNORMAL HIGH (ref 0.00–0.07)
Basophils Absolute: 0.1 10*3/uL (ref 0.0–0.1)
Basophils Relative: 1 %
Eosinophils Absolute: 0 10*3/uL (ref 0.0–0.5)
Eosinophils Relative: 0 %
HCT: 33.4 % — ABNORMAL LOW (ref 39.0–52.0)
Hemoglobin: 10.6 g/dL — ABNORMAL LOW (ref 13.0–17.0)
Immature Granulocytes: 2 %
Lymphocytes Relative: 20 %
Lymphs Abs: 1.6 10*3/uL (ref 0.7–4.0)
MCH: 27.2 pg (ref 26.0–34.0)
MCHC: 31.7 g/dL (ref 30.0–36.0)
MCV: 85.6 fL (ref 80.0–100.0)
Monocytes Absolute: 1.3 10*3/uL — ABNORMAL HIGH (ref 0.1–1.0)
Monocytes Relative: 16 %
Neutro Abs: 5 10*3/uL (ref 1.7–7.7)
Neutrophils Relative %: 61 %
Platelets: 126 10*3/uL — ABNORMAL LOW (ref 150–400)
RBC: 3.9 MIL/uL — ABNORMAL LOW (ref 4.22–5.81)
RDW: 14.7 % (ref 11.5–15.5)
WBC: 8.1 10*3/uL (ref 4.0–10.5)
nRBC: 0 % (ref 0.0–0.2)

## 2019-11-07 LAB — LACTATE DEHYDROGENASE: LDH: 142 U/L (ref 98–192)

## 2019-11-07 MED ORDER — HEPARIN SOD (PORK) LOCK FLUSH 100 UNIT/ML IV SOLN
500.0000 [IU] | Freq: Once | INTRAVENOUS | Status: DC | PRN
  Filled 2019-11-07: qty 5

## 2019-11-07 MED ORDER — SODIUM CHLORIDE 0.9 % IV SOLN
75.0000 mg/m2 | Freq: Once | INTRAVENOUS | Status: AC
  Administered 2019-11-07: 140 mg via INTRAVENOUS
  Filled 2019-11-07: qty 7

## 2019-11-07 MED ORDER — DIPHENHYDRAMINE HCL 50 MG/ML IJ SOLN
25.0000 mg | Freq: Once | INTRAMUSCULAR | Status: AC
  Administered 2019-11-07: 25 mg via INTRAVENOUS

## 2019-11-07 MED ORDER — SODIUM CHLORIDE 0.9% FLUSH
10.0000 mL | INTRAVENOUS | Status: DC | PRN
  Filled 2019-11-07: qty 10

## 2019-11-07 MED ORDER — PALONOSETRON HCL INJECTION 0.25 MG/5ML
0.2500 mg | Freq: Once | INTRAVENOUS | Status: AC
  Administered 2019-11-07: 0.25 mg via INTRAVENOUS

## 2019-11-07 MED ORDER — DIPHENHYDRAMINE HCL 50 MG/ML IJ SOLN
INTRAMUSCULAR | Status: AC
  Filled 2019-11-07: qty 1

## 2019-11-07 MED ORDER — SODIUM CHLORIDE 0.9 % IV SOLN
150.0000 mg | Freq: Once | INTRAVENOUS | Status: AC
  Administered 2019-11-07: 150 mg via INTRAVENOUS
  Filled 2019-11-07: qty 5

## 2019-11-07 MED ORDER — FAMOTIDINE IN NACL 20-0.9 MG/50ML-% IV SOLN
20.0000 mg | Freq: Once | INTRAVENOUS | Status: AC
  Administered 2019-11-07: 20 mg via INTRAVENOUS

## 2019-11-07 MED ORDER — DEXAMETHASONE SODIUM PHOSPHATE 10 MG/ML IJ SOLN
10.0000 mg | Freq: Once | INTRAMUSCULAR | Status: AC
  Administered 2019-11-07: 10 mg via INTRAVENOUS

## 2019-11-07 MED ORDER — DEXAMETHASONE SODIUM PHOSPHATE 10 MG/ML IJ SOLN
INTRAMUSCULAR | Status: AC
  Filled 2019-11-07: qty 1

## 2019-11-07 MED ORDER — SODIUM CHLORIDE 0.9 % IV SOLN
Freq: Once | INTRAVENOUS | Status: AC
  Filled 2019-11-07: qty 250

## 2019-11-07 MED ORDER — FAMOTIDINE IN NACL 20-0.9 MG/50ML-% IV SOLN
INTRAVENOUS | Status: AC
  Filled 2019-11-07: qty 50

## 2019-11-07 MED ORDER — SODIUM CHLORIDE 0.9 % IV SOLN: 515.0000 mg | Freq: Once | INTRAVENOUS | Status: DC

## 2019-11-07 MED ORDER — SODIUM CHLORIDE 0.9 % IV SOLN
490.0000 mg | Freq: Once | INTRAVENOUS | Status: AC
  Administered 2019-11-07: 490 mg via INTRAVENOUS
  Filled 2019-11-07: qty 49

## 2019-11-07 MED ORDER — PALONOSETRON HCL INJECTION 0.25 MG/5ML
INTRAVENOUS | Status: AC
  Filled 2019-11-07: qty 5

## 2019-11-07 NOTE — Patient Instructions (Signed)
[facility] Discharge Instructions for Patients Receiving Chemotherapy  Today you received the following chemotherapy agents: Carboplatin and Etoposide  To help prevent nausea and vomiting after your treatment, we encourage you to take your nausea medication as directed.   If you develop nausea and vomiting that is not controlled by your nausea medication, call the clinic.   BELOW ARE SYMPTOMS THAT SHOULD BE REPORTED IMMEDIATELY:  *FEVER GREATER THAN 100.5 F  *CHILLS WITH OR WITHOUT FEVER  NAUSEA AND VOMITING THAT IS NOT CONTROLLED WITH YOUR NAUSEA MEDICATION  *UNUSUAL SHORTNESS OF BREATH  *UNUSUAL BRUISING OR BLEEDING  TENDERNESS IN MOUTH AND THROAT WITH OR WITHOUT PRESENCE OF ULCERS  *URINARY PROBLEMS  *BOWEL PROBLEMS  UNUSUAL RASH Items with * indicate a potential emergency and should be followed up as soon as possible.  Feel free to call the clinic should you have any questions or concerns. The clinic phone number is [phone].  Please show the [place] at check-in to the Emergency Department and triage nurse.

## 2019-11-07 NOTE — Progress Notes (Signed)
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  HISTORY OF PRESENTING ILLNESS:   [name] is a wonderful 59 y.o. male who has been referred to [place] by Dr. Francine Graven for evaluation and management of Liver masses. He is accompanied today by his wife and aunt. The pt reports that he is doing well overall.   The pt presented to the ED on 10/01/18 regarding persistent RUQ abdominal pain which began a month prior, and noted associations with nausea and 40 pound weight loss. He was evaluated with a CT A/P, as noted below, which revealed liver masses.   The pt reports that he first began feeling RUQ abdominal pain about 3-4 weeks ago, but began losing weight about 2 months ago. He notes that he began feeling more fatigue about 2 months ago as well.  The pt notes that the color of his bowels became dark black and denies taking Iron pills or bismuth medications. He notes that he began feeling constipated as well, feeling backed up. He notes that his stools began looking thinner in caliber. The pt notes that his appetite began decreasing over the last 3-4 weeks ago, and some of this he attributes to intermittent nausea. He denies problems swallowing food. He endorses RUQ pain, and denies vomiting and new cough, new bone pains, testicular pain or swelling, problems passing urine, blood in the urine. The pt has never had a colonoscopy and does not have a PCP, and cites his lack of health insurance as the reason.  The pt was released from the ED with Oxycodone and has been using this to treat his RUQ pain successfully.   The pt notes gout and a heart murmur as previous medical history. He notes that he hasn't consumed alcohol in the past 2 years whatsoever, but prior to this alcohol was a problem for  him.   Of note prior to the patient's visit today, pt has had a CT A/P completed on 10/01/18 with results revealing Interval development of numerous ill-defined hypodense masses throughout the entire liver compatible with metastatic disease. There is a 2.5 cm enhancing mass over the upper pole cortex of the right kidney suspicious for renal cell carcinoma. 2.  No acute findings in the abdomen/pelvis. 3. 1 cm hypodensity over the pancreatic tail not well seen on previous noncontrast exams. MR may be helpful for further evaluation on an elective basis.   Most recent lab results (10/06/18) of CBC w/diff and CMP is as follows: all values are WNL except for Monocytes abs at 1.2k, Sodium at 134, Albumin at 3.0, AST at 77, ALT at 63, Alk Phos at 392, Total Bilirubin at 2.3   On review of systems, pt reports new constipation, smaller caliber stools, dark stools, unexpected weight loss, RUQ pain, new fatigue, and denies vomiting, cough, new SOB, changes in breathing, new bone pains, leg swelling, blood in the urine, changes in urination, fevers, pain along the spine, and any other symptoms.   On PMHx the pt reports Gout, heart murmur, previous alcohol abuse. He denies any surgeries.  On Social Hx the pt reports that he hasn't consumed alcohol in the past 2 years whatsoever. He notes that alcohol abuse was previously a concern. He denies ever smoking cigarettes or doing drugs. He denies any radiation or chemical exposure.  On Family Hx  the pt reports mother with pancreatic cancer in 39s, maternal aunts with high blood pressure and heart failure, maternal aunt with breast cancer.   INTERVAL HISTORY:  [name] returns today for management, evaluation of his relapsed High grade neuroendocrine carcinoma. We are joined today by his brother via phone. He is here for C2 of Carboplatin and Etoposide The patient's last visit with [place] was on 10/19/2019. The pt reports that he is doing well overall.  The pt reports  that he has been eating well and denies any infection issues or bothersome abdominal pain. He has continued to walk daily and drink the recommended amount of water.  Lab results today (11/07/19) of CBC w/diff and CMP is as follows: all values are WNL except for RBC at 3.90, Hgb at 10.6, HCT at 33.4, PLT at 126K, Monocytes Abs at 1.3K, Abs Immature Granulocytes at 0.12K, Glucose at 141, Total Protein at 8.2, Alkaline Phosphatase at 134. 11/07/2019 LDH at 142  On review of systems, pt reports eating well and denies abdominal pain, infection issues, unexpected weight loss and any other symptoms.   MEDICAL HISTORY:  Past Medical History:  Diagnosis Date  . Arthritis    "knees; left hand"  . Cancer ([facility])   . Gout   . Heart murmur    Longstanding. 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 Hypertension   . Near syncope    05/2011 with tachypalpitations & with echo showing mild LVH, normal EF 55-60%, trivial AR    SURGICAL HISTORY: Past Surgical History:  Procedure Laterality Date  . IR IMAGING GUIDED PORT INSERTION  10/26/2018  . NO PAST SURGERIES      SOCIAL HISTORY: Social History   Socioeconomic History  . Marital status: Married    Spouse name: Not on file  . Number of children: 1  . Years of education: Not on file  . Highest education level: Not on file  Occupational History  . Occupation: Unemployed  Tobacco Use  . Smoking status: Never Smoker  . Smokeless tobacco: Never Used  Substance and Sexual Activity  . Alcohol use: Not Currently    Comment: states he has cut back/ every now and again  . Drug use: No  . Sexual activity: Yes    Partners: Female  Other Topics Concern  . Not on file  Social History Narrative   Married. 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 Is from [place].    Social Determinants of Health   Financial Resource Strain:   . Difficulty of Paying Living Expenses: Not on file    Food Insecurity:   . Worried About Charity fundraiser in the Last Year: Not on file  . Ran Out of Food in the Last Year: Not on file  Transportation Needs:   . Lack of Transportation (Medical): Not on file  . Lack of Transportation (Non-Medical): Not on file  Physical Activity:   . Days of Exercise per Week: Not on file  . Minutes of Exercise per Session: Not on file  Stress:   . Feeling of Stress : Not on file  Social Connections:   . Frequency of Communication with Friends and Family: Not on file  . Frequency of Social Gatherings with Friends and Family: Not on file  . Attends Religious Services: Not on file  . Active Member of Clubs or Organizations: Not on file  . Attends Archivist Meetings: Not on file  .  Marital Status: Not on file  Intimate Partner Violence:   . Fear of Current or Ex-Partner: Not on file  . Emotionally Abused: Not on file  . Physically Abused: Not on file  . Sexually Abused: Not on file    FAMILY HISTORY: Family History  Problem Relation Age of Onset  . Cancer Mother        Possibly pancreatic. Died at age 2  . Prostate cancer Father   . Breast cancer Cousin   . Diabetes Paternal Uncle   . Heart disease Paternal Uncle     ALLERGIES:  has No Known Allergies.  MEDICATIONS:  Current Outpatient Medications  Medication Sig Dispense Refill  . allopurinol (ZYLOPRIM) 100 MG tablet Take 2 tablets (200 mg total) by mouth daily. 60 tablet 1  . clindamycin (CLEOCIN) 300 MG capsule Take 1 capsule (300 mg total) by mouth 4 (four) times daily. X 7 days 28 capsule 0  . colchicine 0.6 MG tablet Take 1 tablet (0.6 mg total) by mouth See admin instructions. Take 1.43m (2 tablets) followed by .629m(1 tablet) 1 hour later. 3 tablet 0  . dexamethasone (DECADRON) 4 MG tablet 75m27mo daily with breakfast for 2 weeks then every other day for 2 weeks 30 tablet 0  . dexamethasone (DECADRON) 4 MG tablet Take 2 tablets (8 mg total) by mouth daily. Start the day  after chemotherapy for 1 day. 30 tablet 1  . furosemide (LASIX) 20 MG tablet Take 1 tablet (20 mg total) by mouth daily. 30 tablet 1  . lidocaine-prilocaine (EMLA) cream Apply to affected area once 30 g 3  . lidocaine-prilocaine (EMLA) cream Apply to affected area once 30 g 3  . LORazepam (ATIVAN) 0.5 MG tablet Take 1 tablet (0.5 mg total) by mouth every 6 (six) hours as needed (Nausea or vomiting). 45 tablet 0  . ondansetron (ZOFRAN ODT) 4 MG disintegrating tablet Take 1 tablet (4 mg total) by mouth every 8 (eight) hours as needed for nausea or vomiting. 6 tablet 0  . ondansetron (ZOFRAN) 8 MG tablet Take 1 tablet (8 mg total) by mouth 2 (two) times daily as needed for refractory nausea / vomiting. Start on day 3 after carboplatin chemo. 30 tablet 1  . oxyCODONE (OXY IR/ROXICODONE) 5 MG immediate release tablet Take 1-2 tablets (5-10 mg total) by mouth every 4 (four) hours as needed for severe pain. 120 tablet 0  . potassium chloride SA (K-DUR,KLOR-CON) 20 MEQ tablet Take 1 tablet (20 mEq total) by mouth daily. With lasix 30 tablet 1  . prochlorperazine (COMPAZINE) 10 MG tablet Take 1 tablet (10 mg total) by mouth every 6 (six) hours as needed (Nausea or vomiting). 30 tablet 1  . senna-docusate (SENNA S) 8.6-50 MG tablet Take 2 tablets by mouth at bedtime. 60 tablet 1   No current facility-administered medications for this visit.   Facility-Administered Medications Ordered in Other Visits  Medication Dose Route Frequency Provider Last Rate Last Admin  . CARBOplatin (PARAPLATIN) 520 mg in sodium chloride 0.9 % 250 mL chemo infusion  520 mg Intravenous Once KalBrunetta GeneraD      . denosumab (XGEVA) injection 120 mg  120 mg Subcutaneous Once KalBrunetta GeneraD      . dexamethasone (DECADRON) injection 10 mg  10 mg Intravenous Once KalBrunetta GeneraD      . diphenhydrAMINE (BENADRYL) injection 25 mg  25 mg Intravenous Once KalBrunetta GeneraD      . etoposide (VEPESID)  140  mg in sodium chloride 0.9 % 500 mL chemo infusion  75 mg/m2 (Treatment Plan Recorded) Intravenous Once Brunetta Genera, MD      . famotidine (PEPCID) IVPB 20 mg premix  20 mg Intravenous Once Brunetta Genera, MD      . fosaprepitant (EMEND) 150 mg in sodium chloride 0.9 % 145 mL IVPB  150 mg Intravenous Once Brunetta Genera, MD      . heparin lock flush 100 unit/mL  500 Units Intracatheter Once PRN Brunetta Genera, MD      . palonosetron (ALOXI) injection 0.25 mg  0.25 mg Intravenous Once Brunetta Genera, MD      . sodium chloride flush (NS) 0.9 % injection 10 mL  10 mL Intracatheter PRN Brunetta Genera, MD        REVIEW OF SYSTEMS:   A 10+ POINT REVIEW OF SYSTEMS WAS OBTAINED including neurology, dermatology, psychiatry, cardiac, respiratory, lymph, extremities, GI, GU, Musculoskeletal, constitutional, breasts, reproductive, HEENT.  All pertinent positives are noted in the HPI.  All others are negative.   PHYSICAL EXAMINATION: ECOG FS:2 - Symptomatic, <50% confined to bed   Vitals:   11/07/19 1439  BP: (!) 147/72  Pulse: 75  Resp: 18  Temp: 98.1 F (36.7 C)  SpO2: 100%   Wt Readings from Last 3 Encounters:  11/07/19 157 lb 6.4 oz (71.4 kg)  10/19/19 155 lb (70.3 kg)  10/12/19 156 lb 12 oz (71.1 kg)   Body mass index is 24.65 kg/m.    GENERAL:alert, in no acute distress and comfortable SKIN: no acute rashes, no significant lesions EYES: conjunctiva are pink and non-injected, sclera anicteric OROPHARYNX: MMM, no exudates, no oropharyngeal erythema or ulceration NECK: supple, no JVD LYMPH:  no palpable lymphadenopathy in the cervical, axillary or inguinal regions LUNGS: clear to auscultation b/l with normal respiratory effort HEART: regular rate & rhythm ABDOMEN:  normoactive bowel sounds , non tender, not distended. No palpable splenomegaly. Hepatomegaly 2-3 finger breadths below the costal margin on the right side, improved from last  visit. Extremity: no pedal edema PSYCH: alert & oriented x 3 with fluent speech NEURO: no focal motor/sensory deficits  LABORATORY DATA:  I have reviewed the data as listed  . CBC Latest Ref Rng & Units 11/07/2019 10/26/2019 10/19/2019  WBC 4.0 - 10.5 K/uL 8.1 11.2(H) 19.9(H)  Hemoglobin 13.0 - 17.0 g/dL 10.6(L) 10.2(L) 10.8(L)  Hematocrit 39.0 - 52.0 % 33.4(L) 32.5(L) 33.3(L)  Platelets 150 - 400 K/uL 126(L) 51(L) 83(L)   CMP Latest Ref Rng & Units 11/07/2019 10/19/2019 10/12/2019  Glucose 70 - 99 mg/dL 141(H) 108(H) 116(H)  BUN 6 - 20 mg/dL '14 18 16  ' Creatinine 0.61 - 1.24 mg/dL 1.01 0.85 1.07  Sodium 135 - 145 mmol/L 137 138 136  Potassium 3.5 - 5.1 mmol/L 4.2 4.4 4.4  Chloride 98 - 111 mmol/L 104 106 104  CO2 22 - 32 mmol/L '25 24 25  ' Calcium 8.9 - 10.3 mg/dL 9.0 8.6(L) 8.9  Total Protein 6.5 - 8.1 g/dL 8.2(H) 7.4 8.5(H)  Total Bilirubin 0.3 - 1.2 mg/dL 0.5 0.5 0.7  Alkaline Phos 38 - 126 U/L 134(H) 127(H) 165(H)  AST 15 - 41 U/L 19 14(L) 23  ALT 0 - 44 U/L '13 11 16   ' Lab Results  Component Value Date   LDH 142 11/07/2019    03/31/2019 Mismatch Repair Protein Report   10/08/18 Liver Biopsy:    RADIOGRAPHIC STUDIES: I have personally reviewed the radiological  images as listed and agreed with the findings in the report. No results found.  ASSESSMENT & PLAN:  59 y.o. male with  1. 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 There is a 2.5 cm enhancing mass over the upper pole cortex of the right kidney suspicious for renal cell carcinoma. 2.  No acute findings in the abdomen/pelvis. 3. 1 cm hypodensity over the pancreatic tail not well seen on previous noncontrast exams. MR may be helpful for further evaluation on an elective basis.    10/06/18 Hep B and Hep C were negative   10/06/18 CA19-9  elevated at 652, AFP slightly elevated at 8.8, CEA normal at 2.13, LDH slightly elevated at 215  10/08/18 US Liver Biopsy which revealed High Grade Neuroendocrine Carcinoma  10/15/18 PET/CT revealed Diffuse hepatic and osseous metastatic disease without obvious primary neoplasm. 2. Upper pole right renal lesion is not definitely hypermetabolic and has been present since 2012. I think is unlikely the cause of the metastatic disease. Recommend liver biopsy for tissue diagnosis. If needed, an MRI of the abdomen without and with contrast may be helpful for further evaluation of the renal lesion. 3. Two sub 3 mm right upper lobe pulmonary nodules are indeterminate.   10/26/18 MRI Brain revealed No acute abnormality and negative for metastatic disease.  03/15/19 CT C/A/P which revealed "Significant improvement in multifocal liver metastasis. 2. Interval development of multifocal areas of sclerosis within the visualized axial and proximal appendicular skeleton. These are favored to represent areas of healing bone metastases. 3. Stable lesion within distal tail of pancreas. 4. Aortic Atherosclerosis."   06/19/2019 CT abdomen and pelvis with results revealing "Widespread hepatic metastasis. Individual lesions are difficult to measure however the overall size of the liver is increased significantly and therefore findings are consistent with progression of hepatic metastasis compared to CT of 03/15/2019). No bowel obstruction. No abdominopelvic lymphadenopathy. Stable enhancing small RIGHT renal mass. Stable sclerotic skeletal metastasis."  07/27/2019 CT of the chest abdomen and pelvis with results revealing "Redemonstrated hepatomegaly with numerous bulky hypodense metastatic lesions, generally ill-defined and not significantly changed in size compared to prior examination, a partially exophytic index lesion of the anterior right lobe of the liver measuring 3.3 cm, previously 3.3 cm (series 2, image 58), an index  lesion of the lateral right lobe of the liver measuring 5.6 cm, previously 5.6 cm (series 2, image 56), an index lesion of the anterior left lobe of the liver measuring 4.3 cm, previously 4.5 cm (series 2, image 50). Unchanged sclerotic osseous lesions, including of the T12 vertebral body, L4 vertebral body and bilateral proximal femurs (series 4, image 94, 85, 67, 70). Findings are consistent with unchanged metastatic disease without new evidence of metastasis in the chest, abdomen, or pelvis. There is a redemonstrated, exophytic small mass of the superior pole of the right kidney, best appreciated on coronal series measuring approximately 1.9 x 1.4 cm (series 4, image 87), not significantly changed compared to prior examinations and remain concerning for a small incidental renal cell carcinoma. Aortic Atherosclerosis (ICD10-I70.0) and Emphysema (ICD10-J43.9)."    S/p 6 cycles of Carboplatin and 20% dose reduced Etoposide completed on 02/19/19. Dose reduced as pt developed significant thrombocytopenia after C1.  10/03/2019 CT C/A/P ([id number]) ([id number]) shows disease progression in the liver and a minimal enlargement of a kidney lesion  2. Abnormal LFTs - likely from  liver metastases  Labs upon initial presentation from 10/06/18, some borderline monocytosis at 1.2k, otherwise normal blood counts, AST at 77, ALT at 63, Alk Phos at 392, Total Bilirubin at 2.3   3. Rt renal mass -  seen on 10/15/18 PET/CT but no longer observed on 03/15/19 CT C/A/P  4.Scattered osseous metastases-  07/27/2019 whole body bone scan with results revealing Scattered osseous metastases  as above, including proximal LEFT humerus, RIGHT humeral diaphysis, and BILATERAL femoral diaphyses.    PLAN: -Discussed pt labwork today, 11/07/19; Hgb is stable, PLT has improved, blood chemistries are steady, liver enzymes are stable -Discussed 11/07/2019 LDH is WNL at 142 -Recommended that the pt continue to eat well, drink at least  48-64 oz of water each day, and walk 20-30 minutes each day.  -The pt has no prohibitive toxicities from continuing C2D1 Carboplatin & Etoposide at this time.  -Will lower Etoposide dosage from 80 mg/m^2 to 75 mg/m^2 for C2D1 due to concern for thrombocytopenia caused by decreased liver function -Plan to repeat scans after cycle 3 -Will continue to monitor PLTs -Will repeat labs in 2 days with PLT transfusion -Continue Xgeva q6weeks -Will see back in 4 weeks with labs    FOLLOW UP: Please schedule C2D5 Neulasta as ordered for 11/11/2019 Labs with PLT transfusion in 2 days Please schedule C3 with portflush labs and MD visit on C3D1 Xgeva today and then every 6 weeks with every other chemotherapy.    The total time spent in the appt was 30 minutes and more than 50% was on counseling and direct patient cares.  All of the patient's questions were answered with apparent satisfaction. The patient knows to call the clinic with any problems, questions or concerns.    Sullivan Lone MD [place] AAHIVMS [facility] Hematology/Oncology Physician [facility]  (Office):       [phone] (Work cell):  [phone] (Fax):           [phone]  11/07/2019 3:21 PM  I, Yevette Edwards, am acting as a scribe for Dr. Sullivan Lone.   .I have reviewed the above documentation for accuracy and completeness, and I agree with the above. Brunetta Genera MD

## 2019-11-08 ENCOUNTER — Inpatient Hospital Stay: Payer: Medicaid Other

## 2019-11-08 ENCOUNTER — Other Ambulatory Visit: Payer: Self-pay

## 2019-11-08 VITALS — BP 155/72 | HR 70 | Temp 98.5°F | Resp 18

## 2019-11-08 DIAGNOSIS — C787 Secondary malignant neoplasm of liver and intrahepatic bile duct: Secondary | ICD-10-CM

## 2019-11-08 DIAGNOSIS — Z5111 Encounter for antineoplastic chemotherapy: Secondary | ICD-10-CM | POA: Diagnosis not present

## 2019-11-08 DIAGNOSIS — Z7189 Other specified counseling: Secondary | ICD-10-CM

## 2019-11-08 MED ORDER — SODIUM CHLORIDE 0.9 % IV SOLN
Freq: Once | INTRAVENOUS | Status: AC
  Filled 2019-11-08: qty 250

## 2019-11-08 MED ORDER — SODIUM CHLORIDE 0.9 % IV SOLN
75.0000 mg/m2 | Freq: Once | INTRAVENOUS | Status: AC
  Administered 2019-11-08: 140 mg via INTRAVENOUS
  Filled 2019-11-08: qty 7

## 2019-11-08 MED ORDER — SODIUM CHLORIDE 0.9% FLUSH
10.0000 mL | INTRAVENOUS | Status: DC | PRN
  Administered 2019-11-08: 10 mL
  Filled 2019-11-08: qty 10

## 2019-11-08 MED ORDER — DEXAMETHASONE SODIUM PHOSPHATE 10 MG/ML IJ SOLN
10.0000 mg | Freq: Once | INTRAMUSCULAR | Status: AC
  Administered 2019-11-08: 10 mg via INTRAVENOUS

## 2019-11-08 MED ORDER — HEPARIN SOD (PORK) LOCK FLUSH 100 UNIT/ML IV SOLN
500.0000 [IU] | Freq: Once | INTRAVENOUS | Status: AC | PRN
  Administered 2019-11-08: 10:00:00 500 [IU]
  Filled 2019-11-08: qty 5

## 2019-11-08 MED ORDER — DEXAMETHASONE SODIUM PHOSPHATE 10 MG/ML IJ SOLN
INTRAMUSCULAR | Status: AC
  Filled 2019-11-08: qty 1

## 2019-11-08 NOTE — Patient Instructions (Signed)
[facility] Discharge Instructions for Patients Receiving Chemotherapy  Today you received the following chemotherapy agents Etoposide (VEPESID).  To help prevent nausea and vomiting after your treatment, we encourage you to take your nausea medication as prescribed.  If you develop nausea and vomiting that is not controlled by your nausea medication, call the clinic.   BELOW ARE SYMPTOMS THAT SHOULD BE REPORTED IMMEDIATELY:  *FEVER GREATER THAN 100.5 F  *CHILLS WITH OR WITHOUT FEVER  NAUSEA AND VOMITING THAT IS NOT CONTROLLED WITH YOUR NAUSEA MEDICATION  *UNUSUAL SHORTNESS OF BREATH  *UNUSUAL BRUISING OR BLEEDING  TENDERNESS IN MOUTH AND THROAT WITH OR WITHOUT PRESENCE OF ULCERS  *URINARY PROBLEMS  *BOWEL PROBLEMS  UNUSUAL RASH Items with * indicate a potential emergency and should be followed up as soon as possible.  Feel free to call the clinic should you have any questions or concerns. The clinic phone number is [phone].  Please show the [facility] at check-in to the Emergency Department and triage nurse.  Coronavirus (COVID-19) Are you at risk?  Are you at risk for the Coronavirus (COVID-19)?  To be considered HIGH RISK for Coronavirus (COVID-19), you have to meet the following criteria:  . Traveled to [place], [place], [place], [place] or [place]; or in the [place] to [place], [place], [place], or [place]; and have fever, cough, and shortness of breath within the last 2 weeks of travel OR . Been in close contact with a person diagnosed with COVID-19 within the last 2 weeks and have fever, cough, and shortness of breath . IF YOU DO NOT MEET THESE CRITERIA, YOU ARE CONSIDERED LOW RISK FOR COVID-19.  What to do if you are HIGH RISK for COVID-19?  Marland Kitchen If you are having a medical emergency, call 911. . Seek medical care right away. Before you go to a doctor's office, urgent care or emergency department, call ahead and tell them  about your recent travel, contact with someone diagnosed with COVID-19, and your symptoms. You should receive instructions from your physician's office regarding next steps of care.  . When you arrive at healthcare provider, tell the healthcare staff immediately you have returned from visiting [place], [place], [place], [place] or [place]; or traveled in the [place] to [place], [place], [place], or [place]; in the last two weeks or you have been in close contact with a person diagnosed with COVID-19 in the last 2 weeks.   . Tell the health care staff about your symptoms: fever, cough and shortness of breath. . After you have been seen by a medical provider, you will be either: o Tested for (COVID-19) and discharged home on quarantine except to seek medical care if symptoms worsen, and asked to  - Stay home and avoid contact with others until you get your results (4-5 days)  - Avoid travel on public transportation if possible (such as bus, train, or airplane) or o Sent to the Emergency Department by EMS for evaluation, COVID-19 testing, and possible admission depending on your condition and test results.  What to do if you are LOW RISK for COVID-19?  Reduce your risk of any infection by using the same precautions used for avoiding the common cold or flu:  Marland Kitchen Wash your hands often with soap and warm water for at least 20 seconds.  If soap and water are not readily available, use an alcohol-based hand sanitizer with at least 60% alcohol.  . If coughing or sneezing,  cover your mouth and nose by coughing or sneezing into the elbow areas of your shirt or coat, into a tissue or into your sleeve (not your hands). . Avoid shaking hands with others and consider head nods or verbal greetings only. . Avoid touching your eyes, nose, or mouth with unwashed hands.  . Avoid close contact with people who are sick. . Avoid places or events with large numbers of people in one location, like concerts or  sporting events. . Carefully consider travel plans you have or are making. . If you are planning any travel outside or inside the [place], visit the CDC's Travelers' Health webpage for the latest health notices. . If you have some symptoms but not all symptoms, continue to monitor at home and seek medical attention if your symptoms worsen. . If you are having a medical emergency, call 911.   [place] / e-Visit: [email or web address]         [facility]: [facility]: [phone]                   [facility]: [phone]

## 2019-11-09 ENCOUNTER — Inpatient Hospital Stay: Payer: Medicaid Other

## 2019-11-09 ENCOUNTER — Other Ambulatory Visit: Payer: Self-pay

## 2019-11-09 VITALS — BP 147/73 | HR 70 | Temp 98.2°F | Resp 17

## 2019-11-09 DIAGNOSIS — C801 Malignant (primary) neoplasm, unspecified: Secondary | ICD-10-CM

## 2019-11-09 DIAGNOSIS — C787 Secondary malignant neoplasm of liver and intrahepatic bile duct: Secondary | ICD-10-CM

## 2019-11-09 DIAGNOSIS — Z7189 Other specified counseling: Secondary | ICD-10-CM

## 2019-11-09 DIAGNOSIS — C7951 Secondary malignant neoplasm of bone: Secondary | ICD-10-CM

## 2019-11-09 DIAGNOSIS — Z5111 Encounter for antineoplastic chemotherapy: Secondary | ICD-10-CM | POA: Diagnosis not present

## 2019-11-09 LAB — CMP (CANCER CENTER ONLY)
ALT: 9 U/L (ref 0–44)
AST: 15 U/L (ref 15–41)
Albumin: 3.4 g/dL — ABNORMAL LOW (ref 3.5–5.0)
Alkaline Phosphatase: 122 U/L (ref 38–126)
Anion gap: 10 (ref 5–15)
BUN: 23 mg/dL — ABNORMAL HIGH (ref 6–20)
CO2: 22 mmol/L (ref 22–32)
Calcium: 8 mg/dL — ABNORMAL LOW (ref 8.9–10.3)
Chloride: 107 mmol/L (ref 98–111)
Creatinine: 0.95 mg/dL (ref 0.61–1.24)
GFR, Est AFR Am: >60 mL/min (ref >60)
GFR, Estimated: >60 mL/min (ref >60)
Glucose, Bld: 120 mg/dL — ABNORMAL HIGH (ref 70–99)
Potassium: 4 mmol/L (ref 3.5–5.1)
Sodium: 139 mmol/L (ref 135–145)
Total Bilirubin: 0.6 mg/dL (ref 0.3–1.2)
Total Protein: 7.8 g/dL (ref 6.5–8.1)

## 2019-11-09 MED ORDER — SODIUM CHLORIDE 0.9 % IV SOLN
Freq: Once | INTRAVENOUS | Status: AC
  Filled 2019-11-09: qty 250

## 2019-11-09 MED ORDER — DEXAMETHASONE SODIUM PHOSPHATE 10 MG/ML IJ SOLN
INTRAMUSCULAR | Status: AC
  Filled 2019-11-09: qty 1

## 2019-11-09 MED ORDER — HEPARIN SOD (PORK) LOCK FLUSH 100 UNIT/ML IV SOLN
500.0000 [IU] | Freq: Once | INTRAVENOUS | Status: AC | PRN
  Administered 2019-11-09: 500 [IU]
  Filled 2019-11-09: qty 5

## 2019-11-09 MED ORDER — SODIUM CHLORIDE 0.9 % IV SOLN
75.0000 mg/m2 | Freq: Once | INTRAVENOUS | Status: AC
  Administered 2019-11-09: 140 mg via INTRAVENOUS
  Filled 2019-11-09: qty 7

## 2019-11-09 MED ORDER — SODIUM CHLORIDE 0.9% FLUSH
10.0000 mL | INTRAVENOUS | Status: DC | PRN
  Administered 2019-11-09: 11:00:00 10 mL
  Filled 2019-11-09: qty 10

## 2019-11-09 MED ORDER — DEXAMETHASONE SODIUM PHOSPHATE 10 MG/ML IJ SOLN
10.0000 mg | Freq: Once | INTRAMUSCULAR | Status: AC
  Administered 2019-11-09: 09:00:00 10 mg via INTRAVENOUS

## 2019-11-09 NOTE — Patient Instructions (Signed)
[facility] Discharge Instructions for Patients Receiving Chemotherapy  Today you received the following chemotherapy agents: Etoposide (Vepesid, VP-16)  To help prevent nausea and vomiting after your treatment, we encourage you to take your nausea medication as directed.   If you develop nausea and vomiting that is not controlled by your nausea medication, call the clinic.   BELOW ARE SYMPTOMS THAT SHOULD BE REPORTED IMMEDIATELY:  *FEVER GREATER THAN 100.5 F  *CHILLS WITH OR WITHOUT FEVER  NAUSEA AND VOMITING THAT IS NOT CONTROLLED WITH YOUR NAUSEA MEDICATION  *UNUSUAL SHORTNESS OF BREATH  *UNUSUAL BRUISING OR BLEEDING  TENDERNESS IN MOUTH AND THROAT WITH OR WITHOUT PRESENCE OF ULCERS  *URINARY PROBLEMS  *BOWEL PROBLEMS  UNUSUAL RASH Items with * indicate a potential emergency and should be followed up as soon as possible.  Feel free to call the clinic should you have any questions or concerns. The clinic phone number is [phone].  Please show the [place] at check-in to the Emergency Department and triage nurse.  Coronavirus (COVID-19) Are you at risk?  Are you at risk for the Coronavirus (COVID-19)?  To be considered HIGH RISK for Coronavirus (COVID-19), you have to meet the following criteria:  . Traveled to [place], [place], [place], [place] or [place]; or in the [place] to [place], [place], [place], or [place]; and have fever, cough, and shortness of breath within the last 2 weeks of travel OR . Been in close contact with a person diagnosed with COVID-19 within the last 2 weeks and have fever, cough, and shortness of breath . IF YOU DO NOT MEET THESE CRITERIA, YOU ARE CONSIDERED LOW RISK FOR COVID-19.  What to do if you are HIGH RISK for COVID-19?  Marland Kitchen If you are having a medical emergency, call 911. . Seek medical care right away. Before you go to a doctor's office, urgent care or emergency department, call ahead and tell  them about your recent travel, contact with someone diagnosed with COVID-19, and your symptoms. You should receive instructions from your physician's office regarding next steps of care.  . When you arrive at healthcare provider, tell the healthcare staff immediately you have returned from visiting [place], [place], [place], [place] or [place]; or traveled in the [place] to [place], [place], [place], or [place]; in the last two weeks or you have been in close contact with a person diagnosed with COVID-19 in the last 2 weeks.   . Tell the health care staff about your symptoms: fever, cough and shortness of breath. . After you have been seen by a medical provider, you will be either: o Tested for (COVID-19) and discharged home on quarantine except to seek medical care if symptoms worsen, and asked to  - Stay home and avoid contact with others until you get your results (4-5 days)  - Avoid travel on public transportation if possible (such as bus, train, or airplane) or o Sent to the Emergency Department by EMS for evaluation, COVID-19 testing, and possible admission depending on your condition and test results.  What to do if you are LOW RISK for COVID-19?  Reduce your risk of any infection by using the same precautions used for avoiding the common cold or flu:  Marland Kitchen Wash your hands often with soap and warm water for at least 20 seconds.  If soap and water are not readily available, use an alcohol-based hand sanitizer with at least 60% alcohol.  . If coughing  or sneezing, cover your mouth and nose by coughing or sneezing into the elbow areas of your shirt or coat, into a tissue or into your sleeve (not your hands). . Avoid shaking hands with others and consider head nods or verbal greetings only. . Avoid touching your eyes, nose, or mouth with unwashed hands.  . Avoid close contact with people who are sick. . Avoid places or events with large numbers of people in one location, like concerts or  sporting events. . Carefully consider travel plans you have or are making. . If you are planning any travel outside or inside the [place], visit the CDC's Travelers' Health webpage for the latest health notices. . If you have some symptoms but not all symptoms, continue to monitor at home and seek medical attention if your symptoms worsen. . If you are having a medical emergency, call 911.   [place] / e-Visit: [email or web address]         [facility]: [facility]: [phone]                   [facility]: [phone]

## 2019-11-11 ENCOUNTER — Other Ambulatory Visit: Payer: Self-pay

## 2019-11-11 ENCOUNTER — Inpatient Hospital Stay: Payer: Medicaid Other

## 2019-11-11 ENCOUNTER — Ambulatory Visit: Payer: Medicaid Other

## 2019-11-11 VITALS — BP 143/62 | HR 67 | Temp 99.4°F | Resp 18

## 2019-11-11 DIAGNOSIS — Z5111 Encounter for antineoplastic chemotherapy: Secondary | ICD-10-CM | POA: Diagnosis not present

## 2019-11-11 DIAGNOSIS — Z7189 Other specified counseling: Secondary | ICD-10-CM

## 2019-11-11 DIAGNOSIS — C801 Malignant (primary) neoplasm, unspecified: Secondary | ICD-10-CM

## 2019-11-11 DIAGNOSIS — C787 Secondary malignant neoplasm of liver and intrahepatic bile duct: Secondary | ICD-10-CM

## 2019-11-11 MED ORDER — PEGFILGRASTIM-JMDB 6 MG/0.6ML ~~LOC~~ SOSY
PREFILLED_SYRINGE | SUBCUTANEOUS | Status: AC
  Filled 2019-11-11: qty 0.6

## 2019-11-11 MED ORDER — PEGFILGRASTIM-JMDB 6 MG/0.6ML ~~LOC~~ SOSY
6.0000 mg | PREFILLED_SYRINGE | Freq: Once | SUBCUTANEOUS | Status: AC
  Administered 2019-11-11: 14:00:00 6 mg via SUBCUTANEOUS

## 2019-11-11 NOTE — Patient Instructions (Signed)

## 2019-11-21 ENCOUNTER — Other Ambulatory Visit: Payer: Self-pay | Admitting: Medical

## 2019-11-21 DIAGNOSIS — C787 Secondary malignant neoplasm of liver and intrahepatic bile duct: Secondary | ICD-10-CM

## 2019-11-21 DIAGNOSIS — Z7189 Other specified counseling: Secondary | ICD-10-CM

## 2019-11-21 MED ORDER — LORAZEPAM 0.5 MG PO TABS: 0.5000 mg | ORAL_TABLET | Freq: Four times a day (QID) | ORAL | 0 refills | Status: DC | PRN

## 2019-11-21 MED ORDER — OXYCODONE HCL 5 MG PO TABS: 5.0000 mg | ORAL_TABLET | ORAL | 0 refills | Status: DC | PRN

## 2019-11-21 NOTE — Telephone Encounter (Signed)
First one

## 2019-11-21 NOTE — Telephone Encounter (Signed)
2nd one

## 2019-11-23 NOTE — Progress Notes (Signed)
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  HISTORY OF PRESENTING ILLNESS:   [name] is a wonderful 59 y.o. male who has been referred to [place] by Dr. Francine Graven for evaluation and management of Liver masses. He is accompanied today by his wife and aunt. The pt reports that he is doing well overall.   The pt presented to the ED on 10/01/18 regarding persistent RUQ abdominal pain which began a month prior, and noted associations with nausea and 40 pound weight loss. He was evaluated with a CT A/P, as noted below, which revealed liver masses.   The pt reports that he first began feeling RUQ abdominal pain about 3-4 weeks ago, but began losing weight about 2 months ago. He notes that he began feeling more fatigue about 2 months ago as well.  The pt notes that the color of his bowels became dark black and denies taking Iron pills or bismuth medications. He notes that he began feeling constipated as well, feeling backed up. He notes that his stools began looking thinner in caliber. The pt notes that his appetite began decreasing over the last 3-4 weeks ago, and some of this he attributes to intermittent nausea. He denies problems swallowing food. He endorses RUQ pain, and denies vomiting and new cough, new bone pains, testicular pain or swelling, problems passing urine, blood in the urine. The pt has never had a colonoscopy and does not have a PCP, and cites his lack of health insurance as the reason.  The pt was released from the ED with Oxycodone and has been using this to treat his RUQ pain successfully.   The pt notes gout and a heart murmur as previous medical history. He notes that he hasn't consumed alcohol in the past 2 years whatsoever, but prior to this alcohol was a problem for  him.   Of note prior to the patient's visit today, pt has had a CT A/P completed on 10/01/18 with results revealing Interval development of numerous ill-defined hypodense masses throughout the entire liver compatible with metastatic disease. There is a 2.5 cm enhancing mass over the upper pole cortex of the right kidney suspicious for renal cell carcinoma. 2.  No acute findings in the abdomen/pelvis. 3. 1 cm hypodensity over the pancreatic tail not well seen on previous noncontrast exams. MR may be helpful for further evaluation on an elective basis.   Most recent lab results (10/06/18) of CBC w/diff and CMP is as follows: all values are WNL except for Monocytes abs at 1.2k, Sodium at 134, Albumin at 3.0, AST at 77, ALT at 63, Alk Phos at 392, Total Bilirubin at 2.3   On review of systems, pt reports new constipation, smaller caliber stools, dark stools, unexpected weight loss, RUQ pain, new fatigue, and denies vomiting, cough, new SOB, changes in breathing, new bone pains, leg swelling, blood in the urine, changes in urination, fevers, pain along the spine, and any other symptoms.   On PMHx the pt reports Gout, heart murmur, previous alcohol abuse. He denies any surgeries.  On Social Hx the pt reports that he hasn't consumed alcohol in the past 2 years whatsoever. He notes that alcohol abuse was previously a concern. He denies ever smoking cigarettes or doing drugs. He denies any radiation or chemical exposure.  On Family Hx  the pt reports mother with pancreatic cancer in 47s, maternal aunts with high blood pressure and heart failure, maternal aunt with breast cancer.   INTERVAL HISTORY:  [name] returns today for management, evaluation of his relapsed High grade neuroendocrine carcinoma. We are joined today by his brother via phone. He is here for C3 of Carboplatin and Etoposide. The patient's last visit with [place] was on 11/07/2019. The pt reports that he is doing well overall.  The pt reports  that he has been doing his best to eat well and stay active by walking daily. He has even gained a few pounds. He denies any issues from his current chemotherapy treatment or any signs of infection.  Lab results today (11/28/19) of CBC w/diff and CMP is as follows: all values are WNL except for WBC at 23.5K, RBC at 3.62, Hgb at 10.0, HCT at 31.4, RDW at 16.2, PLT at 98K, Glucose at 144, Calcium at 8.5, Albumin at 3.4, Alkaline Phosphatase at 143. 11/28/2019 LDH at 245  On review of systems, pt reports eating well and denies leg swelling, SOB, chest pain, tingling/numbness in hands/feet, constipation, nausea, fevers, chills, night sweats, sore throat, mouth sores, abdominal pain and any other symptoms.   MEDICAL HISTORY:  Past Medical History:  Diagnosis Date  . Arthritis    "knees; left hand"  . Cancer ([place])   . Gout   . Heart murmur    Longstanding. 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 Hypertension   . Near syncope    05/2011 with tachypalpitations & with echo showing mild LVH, normal EF 55-60%, trivial AR    SURGICAL HISTORY: Past Surgical History:  Procedure Laterality Date  . IR IMAGING GUIDED PORT INSERTION  10/26/2018  . NO PAST SURGERIES      SOCIAL HISTORY: Social History   Socioeconomic History  . Marital status: Married    Spouse name: Not on file  . Number of children: 1  . Years of education: Not on file  . Highest education level: Not on file  Occupational History  . Occupation: Unemployed  Tobacco Use  . Smoking status: Never Smoker  . Smokeless tobacco: Never Used  Substance and Sexual Activity  . Alcohol use: Not Currently    Comment: states he has cut back/ every now and again  . Drug use: No  . Sexual activity: Yes    Partners: Female  Other Topics Concern  . Not on file  Social History Narrative   Married. 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 Is from [place].    Social  Determinants of Health   Financial Resource Strain:   . Difficulty of Paying Living Expenses: Not on file  Food Insecurity:   . Worried About Charity fundraiser in the Last Year: Not on file  . Ran Out of Food in the Last Year: Not on file  Transportation Needs:   . Lack of Transportation (Medical): Not on file  . Lack of Transportation (Non-Medical): Not on file  Physical Activity:   . Days of Exercise per Week: Not on file  . Minutes of Exercise per Session: Not on file  Stress:   . Feeling of Stress : Not on file  Social Connections:   . Frequency of Communication with Friends and Family: Not on file  . Frequency of Social Gatherings with Friends and Family: Not on file  . Attends Religious Services: Not on file  .  Active Member of Clubs or Organizations: Not on file  . Attends Archivist Meetings: Not on file  . Marital Status: Not on file  Intimate Partner Violence:   . Fear of Current or Ex-Partner: Not on file  . Emotionally Abused: Not on file  . Physically Abused: Not on file  . Sexually Abused: Not on file    FAMILY HISTORY: Family History  Problem Relation Age of Onset  . Cancer Mother        Possibly pancreatic. Died at age 23  . Prostate cancer Father   . Breast cancer Cousin   . Diabetes Paternal Uncle   . Heart disease Paternal Uncle     ALLERGIES:  has No Known Allergies.  MEDICATIONS:  Current Outpatient Medications  Medication Sig Dispense Refill  . dexamethasone (DECADRON) 4 MG tablet 53m po daily with breakfast for 2 weeks then every other day for 2 weeks 30 tablet 0  . dexamethasone (DECADRON) 4 MG tablet Take 2 tablets (8 mg total) by mouth daily. Start the day after chemotherapy for 1 day. 30 tablet 1  . lidocaine-prilocaine (EMLA) cream Apply to affected area once 30 g 3  . lidocaine-prilocaine (EMLA) cream Apply to affected area once 30 g 3  . ondansetron (ZOFRAN) 8 MG tablet Take 1 tablet (8 mg total) by mouth 2 (two) times daily  as needed for refractory nausea / vomiting. Start on day 3 after carboplatin chemo. 30 tablet 1  . oxyCODONE (OXY IR/ROXICODONE) 5 MG immediate release tablet Take 1-2 tablets (5-10 mg total) by mouth every 4 (four) hours as needed for severe pain. 120 tablet 0  . senna-docusate (SENNA S) 8.6-50 MG tablet Take 2 tablets by mouth at bedtime. 60 tablet 1  . allopurinol (ZYLOPRIM) 100 MG tablet Take 2 tablets (200 mg total) by mouth daily. (Patient not taking: Reported on 11/28/2019) 60 tablet 1  . clindamycin (CLEOCIN) 300 MG capsule Take 1 capsule (300 mg total) by mouth 4 (four) times daily. X 7 days (Patient not taking: Reported on 11/28/2019) 28 capsule 0  . colchicine 0.6 MG tablet Take 1 tablet (0.6 mg total) by mouth See admin instructions. Take 1.261m(2 tablets) followed by .67m60m1 tablet) 1 hour later. (Patient not taking: Reported on 11/28/2019) 3 tablet 0  . furosemide (LASIX) 20 MG tablet Take 1 tablet (20 mg total) by mouth daily. (Patient not taking: Reported on 11/28/2019) 30 tablet 1  . LORazepam (ATIVAN) 0.5 MG tablet Take 1 tablet (0.5 mg total) by mouth every 6 (six) hours as needed (Nausea or vomiting). (Patient not taking: Reported on 11/28/2019) 45 tablet 0  . ondansetron (ZOFRAN ODT) 4 MG disintegrating tablet Take 1 tablet (4 mg total) by mouth every 8 (eight) hours as needed for nausea or vomiting. (Patient not taking: Reported on 11/28/2019) 6 tablet 0  . potassium chloride SA (K-DUR,KLOR-CON) 20 MEQ tablet Take 1 tablet (20 mEq total) by mouth daily. With lasix (Patient not taking: Reported on 11/28/2019) 30 tablet 1  . prochlorperazine (COMPAZINE) 10 MG tablet Take 1 tablet (10 mg total) by mouth every 6 (six) hours as needed (Nausea or vomiting). (Patient not taking: Reported on 11/28/2019) 30 tablet 1   No current facility-administered medications for this visit.   Facility-Administered Medications Ordered in Other Visits  Medication Dose Route Frequency Provider Last Rate Last  Admin  . CARBOplatin (PARAPLATIN) 500 mg in sodium chloride 0.9 % 250 mL chemo infusion  500 mg Intravenous Once KalIndependenceautam  Vivien Rossetti, MD      . denosumab (XGEVA) injection 120 mg  120 mg Subcutaneous Once Brunetta Genera, MD      . denosumab (XGEVA) injection 120 mg  120 mg Subcutaneous Once Brunetta Genera, MD      . etoposide (VEPESID) 140 mg in sodium chloride 0.9 % 500 mL chemo infusion  75 mg/m2 (Treatment Plan Recorded) Intravenous Once Brunetta Genera, MD      . fosaprepitant (EMEND) 150 mg in sodium chloride 0.9 % 145 mL IVPB  150 mg Intravenous Once Brunetta Genera, MD 450 mL/hr at 11/28/19 1005 150 mg at 11/28/19 1005    REVIEW OF SYSTEMS:   A 10+ POINT REVIEW OF SYSTEMS WAS OBTAINED including neurology, dermatology, psychiatry, cardiac, respiratory, lymph, extremities, GI, GU, Musculoskeletal, constitutional, breasts, reproductive, HEENT.  All pertinent positives are noted in the HPI.  All others are negative.   PHYSICAL EXAMINATION: ECOG FS:2 - Symptomatic, <50% confined to bed   Vitals:   11/28/19 0901  BP: (!) 163/70  Pulse: 76  Resp: 18  Temp: 98.9 F (37.2 C)  SpO2: 100%   Wt Readings from Last 3 Encounters:  11/28/19 159 lb 9.6 oz (72.4 kg)  11/07/19 157 lb 6.4 oz (71.4 kg)  10/19/19 155 lb (70.3 kg)   Body mass index is 25 kg/m.    GENERAL:alert, in no acute distress and comfortable SKIN: no acute rashes, no significant lesions EYES: conjunctiva are pink and non-injected, sclera anicteric OROPHARYNX: MMM, no exudates, no oropharyngeal erythema or ulceration NECK: supple, no JVD LYMPH:  no palpable lymphadenopathy in the cervical, axillary or inguinal regions LUNGS: clear to auscultation b/l with normal respiratory effort HEART: regular rate & rhythm ABDOMEN:  normoactive bowel sounds , non tender, not distended. No palpable splenomegaly. Hepatomegaly 3-4 cm below the costal margin on the right side, improved from last visit. Extremity: no  pedal edema PSYCH: alert & oriented x 3 with fluent speech NEURO: no focal motor/sensory deficits  LABORATORY DATA:  I have reviewed the data as listed  . CBC Latest Ref Rng & Units 11/28/2019 11/07/2019 10/26/2019  WBC 4.0 - 10.5 K/uL 23.5(H) 8.1 11.2(H)  Hemoglobin 13.0 - 17.0 g/dL 10.0(L) 10.6(L) 10.2(L)  Hematocrit 39.0 - 52.0 % 31.4(L) 33.4(L) 32.5(L)  Platelets 150 - 400 K/uL 98(L) 126(L) 51(L)   CMP Latest Ref Rng & Units 11/28/2019 11/09/2019 11/07/2019  Glucose 70 - 99 mg/dL 114(H) 120(H) 141(H)  BUN 6 - 20 mg/dL 10 23(H) 14  Creatinine 0.61 - 1.24 mg/dL 0.95 0.95 1.01  Sodium 135 - 145 mmol/L 137 139 137  Potassium 3.5 - 5.1 mmol/L 3.9 4.0 4.2  Chloride 98 - 111 mmol/L 106 107 104  CO2 22 - 32 mmol/L _0 Calcium 8.9 - 10.3 mg/dL 8.5(L) 8.0(L) 9.0  Total Protein 6.5 - 8.1 g/dL 7.9 7.8 8.2(H)  Total Bilirubin 0.3 - 1.2 mg/dL 0.4 0.6 0.5  Alkaline Phos 38 - 126 U/L 143(H) 122 134(H)  AST 15 - 41 U/L _1 ALT 0 - 44 U/L _2 Lab Results  Component Value Date   LDH 245 (H) 11/28/2019    03/31/2019 Mismatch Repair Protein Report   10/08/18 Liver Biopsy:    RADIOGRAPHIC STUDIES: I have personally reviewed the radiological images as listed and agreed with the findings in the report. No results found.  ASSESSMENT & PLAN:  59 y.o. male with  1. Metastatic High Grade Neuroendocrine Carcinoma with  Multiple liver masses  10/01/18 CT A/P revealed Interval development of numerous ill-defined hypodense masses throughout the entire liver compatible with metastatic disease. There is a 2.5 cm enhancing mass over the upper pole cortex of the right kidney suspicious for renal cell carcinoma. 2.  No acute findings in the abdomen/pelvis. 3. 1 cm hypodensity over the pancreatic tail not well seen on previous noncontrast exams. MR may be helpful for further evaluation on an elective basis.    10/06/18 Hep B and Hep C were negative   10/06/18 CA19-9 elevated at 652, AFP  slightly elevated at 8.8, CEA normal at 2.13, LDH slightly elevated at 215  10/08/18 US Liver Biopsy which revealed High Grade Neuroendocrine Carcinoma  10/15/18 PET/CT revealed Diffuse hepatic and osseous metastatic disease without obvious primary neoplasm. 2. Upper pole right renal lesion is not definitely hypermetabolic and has been present since 2012. I think is unlikely the cause of the metastatic disease. Recommend liver biopsy for tissue diagnosis. If needed, an MRI of the abdomen without and with contrast may be helpful for further evaluation of the renal lesion. 3. Two sub 3 mm right upper lobe pulmonary nodules are indeterminate.   10/26/18 MRI Brain revealed No acute abnormality and negative for metastatic disease.  03/15/19 CT C/A/P which revealed "Significant improvement in multifocal liver metastasis. 2. Interval development of multifocal areas of sclerosis within the visualized axial and proximal appendicular skeleton. These are favored to represent areas of healing bone metastases. 3. Stable lesion within distal tail of pancreas. 4. Aortic Atherosclerosis."   06/19/2019 CT abdomen and pelvis with results revealing "Widespread hepatic metastasis. Individual lesions are difficult to measure however the overall size of the liver is increased significantly and therefore findings are consistent with progression of hepatic metastasis compared to CT of 03/15/2019). No bowel obstruction. No abdominopelvic lymphadenopathy. Stable enhancing small RIGHT renal mass. Stable sclerotic skeletal metastasis."  07/27/2019 CT of the chest abdomen and pelvis with results revealing "Redemonstrated hepatomegaly with numerous bulky hypodense metastatic lesions, generally ill-defined and not significantly changed in size compared to prior examination, a partially exophytic index lesion of the anterior right lobe of the liver measuring 3.3 cm, previously 3.3 cm (series 2, image 58), an index lesion of the lateral  right lobe of the liver measuring 5.6 cm, previously 5.6 cm (series 2, image 56), an index lesion of the anterior left lobe of the liver measuring 4.3 cm, previously 4.5 cm (series 2, image 50). Unchanged sclerotic osseous lesions, including of the T12 vertebral body, L4 vertebral body and bilateral proximal femurs (series 4, image 94, 85, 67, 70). Findings are consistent with unchanged metastatic disease without new evidence of metastasis in the chest, abdomen, or pelvis. There is a redemonstrated, exophytic small mass of the superior pole of the right kidney, best appreciated on coronal series measuring approximately 1.9 x 1.4 cm (series 4, image 87), not significantly changed compared to prior examinations and remain concerning for a small incidental renal cell carcinoma. Aortic Atherosclerosis (ICD10-I70.0) and Emphysema (ICD10-J43.9)."    S/p 6 cycles of Carboplatin and 20% dose reduced Etoposide completed on 02/19/19. Dose reduced as pt developed significant thrombocytopenia after C1.  10/03/2019 CT C/A/P ([id number]) ([id number]) shows disease progression in the liver and a minimal enlargement of a kidney lesion  2. Abnormal LFTs - likely from liver metastases  Labs upon initial presentation from 10/06/18, some borderline monocytosis at 1.2k, otherwise normal blood counts, AST at 77, ALT at 63, Alk Phos at 392, Total Bilirubin at  2.3   3. Rt renal mass -  seen on 10/15/18 PET/CT but no longer observed on 03/15/19 CT C/A/P  4.Scattered osseous metastases-  07/27/2019 whole body bone scan with results revealing Scattered osseous metastases  as above, including proximal LEFT humerus, RIGHT humeral diaphysis, and BILATERAL femoral diaphyses.    PLAN: -Discussed pt labwork today, 11/28/19; PLT down, other blood counts are holding, blood chemistries are steady -Discussed 11/28/2019 LDH is elevated at 245 -Will continue to monitor PLTs -The pt has no prohibitive toxicities from continuing C3D1  Carboplatin & Etoposide at this time.  -Continue lowered Etoposide dosage at 75 mg/m^2 for C3D1 due to concern for thrombocytopenia caused by decreased liver function -Continue Xgeva q6weeks -Will get repeat CT C/A/P in 2 weeks -Will see back in 3 weeks    FOLLOW UP: CT chest/abd/pelvis in 2 weeks Plz schedule C4 of chemotherapy with portflush, labs and MD visit Continue Alphonse Guild    The total time spent in the appt was 15 minutes and more than 50% was on counseling and direct patient cares.  All of the patient's questions were answered with apparent satisfaction. The patient knows to call the clinic with any problems, questions or concerns.    Sullivan Lone MD [place] AAHIVMS [facility] Hematology/Oncology Phys[facility]  (Office):       [phone] (Work cell):  [phone] (Fax):           [phone]  11/28/2019 10:13 AM  I, Yevette Edwards, am acting as a scribe for Dr. Sullivan Lone.   .I have reviewed the above documentation for accuracy and completeness, and I agree with the above. Brunetta Genera MD

## 2019-11-28 ENCOUNTER — Other Ambulatory Visit: Payer: Self-pay

## 2019-11-28 ENCOUNTER — Inpatient Hospital Stay: Payer: Medicaid Other

## 2019-11-28 ENCOUNTER — Other Ambulatory Visit: Payer: Self-pay | Admitting: *Deleted

## 2019-11-28 ENCOUNTER — Inpatient Hospital Stay (HOSPITAL_BASED_OUTPATIENT_CLINIC_OR_DEPARTMENT_OTHER): Payer: Medicaid Other | Admitting: Hematology

## 2019-11-28 VITALS — BP 163/70 | HR 76 | Temp 98.9°F | Resp 18 | Ht 67.0 in | Wt 159.6 lb

## 2019-11-28 DIAGNOSIS — Z7189 Other specified counseling: Secondary | ICD-10-CM

## 2019-11-28 DIAGNOSIS — C787 Secondary malignant neoplasm of liver and intrahepatic bile duct: Secondary | ICD-10-CM

## 2019-11-28 DIAGNOSIS — C7951 Secondary malignant neoplasm of bone: Secondary | ICD-10-CM | POA: Diagnosis not present

## 2019-11-28 DIAGNOSIS — C801 Malignant (primary) neoplasm, unspecified: Secondary | ICD-10-CM | POA: Diagnosis not present

## 2019-11-28 DIAGNOSIS — Z5111 Encounter for antineoplastic chemotherapy: Secondary | ICD-10-CM

## 2019-11-28 DIAGNOSIS — Z95828 Presence of other vascular implants and grafts: Secondary | ICD-10-CM

## 2019-11-28 LAB — CBC WITH DIFFERENTIAL (CANCER CENTER ONLY)
Abs Immature Granulocytes: 2.48 10*3/uL — ABNORMAL HIGH (ref 0.00–0.07)
Basophils Absolute: 0.2 10*3/uL — ABNORMAL HIGH (ref 0.0–0.1)
Basophils Relative: 1 %
Eosinophils Absolute: 0.2 10*3/uL (ref 0.0–0.5)
Eosinophils Relative: 1 %
HCT: 31.4 % — ABNORMAL LOW (ref 39.0–52.0)
Hemoglobin: 10 g/dL — ABNORMAL LOW (ref 13.0–17.0)
Immature Granulocytes: 11 %
Lymphocytes Relative: 8 %
Lymphs Abs: 1.9 10*3/uL (ref 0.7–4.0)
MCH: 27.6 pg (ref 26.0–34.0)
MCHC: 31.8 g/dL (ref 30.0–36.0)
MCV: 86.7 fL (ref 80.0–100.0)
Monocytes Absolute: 1.5 10*3/uL — ABNORMAL HIGH (ref 0.1–1.0)
Monocytes Relative: 7 %
Neutro Abs: 17.3 10*3/uL — ABNORMAL HIGH (ref 1.7–7.7)
Neutrophils Relative %: 72 %
Platelet Count: 98 10*3/uL — ABNORMAL LOW (ref 150–400)
RBC: 3.62 MIL/uL — ABNORMAL LOW (ref 4.22–5.81)
RDW: 16.2 % — ABNORMAL HIGH (ref 11.5–15.5)
WBC Count: 23.5 10*3/uL — ABNORMAL HIGH (ref 4.0–10.5)
nRBC: 0 % (ref 0.0–0.2)

## 2019-11-28 LAB — CMP (CANCER CENTER ONLY)
ALT: 9 U/L (ref 0–44)
AST: 18 U/L (ref 15–41)
Albumin: 3.4 g/dL — ABNORMAL LOW (ref 3.5–5.0)
Alkaline Phosphatase: 143 U/L — ABNORMAL HIGH (ref 38–126)
Anion gap: 7 (ref 5–15)
BUN: 10 mg/dL (ref 6–20)
CO2: 24 mmol/L (ref 22–32)
Calcium: 8.5 mg/dL — ABNORMAL LOW (ref 8.9–10.3)
Chloride: 106 mmol/L (ref 98–111)
Creatinine: 0.95 mg/dL (ref 0.61–1.24)
GFR, Est AFR Am: >60 mL/min (ref >60)
GFR, Estimated: >60 mL/min (ref >60)
Glucose, Bld: 114 mg/dL — ABNORMAL HIGH (ref 70–99)
Potassium: 3.9 mmol/L (ref 3.5–5.1)
Sodium: 137 mmol/L (ref 135–145)
Total Bilirubin: 0.4 mg/dL (ref 0.3–1.2)
Total Protein: 7.9 g/dL (ref 6.5–8.1)

## 2019-11-28 LAB — LACTATE DEHYDROGENASE: LDH: 245 U/L — ABNORMAL HIGH (ref 98–192)

## 2019-11-28 MED ORDER — SODIUM CHLORIDE 0.9 % IV SOLN
150.0000 mg | Freq: Once | INTRAVENOUS | Status: AC
  Administered 2019-11-28: 150 mg via INTRAVENOUS
  Filled 2019-11-28: qty 150

## 2019-11-28 MED ORDER — DEXAMETHASONE SODIUM PHOSPHATE 10 MG/ML IJ SOLN
INTRAMUSCULAR | Status: AC
  Filled 2019-11-28: qty 1

## 2019-11-28 MED ORDER — SODIUM CHLORIDE 0.9 % IV SOLN
500.0000 mg | Freq: Once | INTRAVENOUS | Status: AC
  Administered 2019-11-28: 11:00:00 500 mg via INTRAVENOUS
  Filled 2019-11-28: qty 50

## 2019-11-28 MED ORDER — DIPHENHYDRAMINE HCL 50 MG/ML IJ SOLN
INTRAMUSCULAR | Status: AC
  Filled 2019-11-28: qty 1

## 2019-11-28 MED ORDER — SODIUM CHLORIDE 0.9 % IV SOLN
75.0000 mg/m2 | Freq: Once | INTRAVENOUS | Status: AC
  Administered 2019-11-28: 140 mg via INTRAVENOUS
  Filled 2019-11-28: qty 7

## 2019-11-28 MED ORDER — FAMOTIDINE IN NACL 20-0.9 MG/50ML-% IV SOLN
20.0000 mg | Freq: Once | INTRAVENOUS | Status: AC
  Administered 2019-11-28: 10:00:00 20 mg via INTRAVENOUS

## 2019-11-28 MED ORDER — FAMOTIDINE IN NACL 20-0.9 MG/50ML-% IV SOLN
INTRAVENOUS | Status: AC
  Filled 2019-11-28: qty 50

## 2019-11-28 MED ORDER — DENOSUMAB 120 MG/1.7ML ~~LOC~~ SOLN
120.0000 mg | Freq: Once | SUBCUTANEOUS | Status: AC
  Administered 2019-11-28: 120 mg via SUBCUTANEOUS

## 2019-11-28 MED ORDER — DEXAMETHASONE SODIUM PHOSPHATE 10 MG/ML IJ SOLN
10.0000 mg | Freq: Once | INTRAMUSCULAR | Status: AC
  Administered 2019-11-28: 10 mg via INTRAVENOUS

## 2019-11-28 MED ORDER — SODIUM CHLORIDE 0.9% FLUSH
10.0000 mL | INTRAVENOUS | Status: DC | PRN
  Administered 2019-11-28: 13:00:00 10 mL
  Filled 2019-11-28: qty 10

## 2019-11-28 MED ORDER — DENOSUMAB 120 MG/1.7ML ~~LOC~~ SOLN
SUBCUTANEOUS | Status: AC
  Filled 2019-11-28: qty 1.7

## 2019-11-28 MED ORDER — HEPARIN SOD (PORK) LOCK FLUSH 100 UNIT/ML IV SOLN
500.0000 [IU] | Freq: Once | INTRAVENOUS | Status: AC | PRN
  Administered 2019-11-28: 500 [IU]
  Filled 2019-11-28: qty 5

## 2019-11-28 MED ORDER — PALONOSETRON HCL INJECTION 0.25 MG/5ML
INTRAVENOUS | Status: AC
  Filled 2019-11-28: qty 5

## 2019-11-28 MED ORDER — PALONOSETRON HCL INJECTION 0.25 MG/5ML
0.2500 mg | Freq: Once | INTRAVENOUS | Status: AC
  Administered 2019-11-28: 0.25 mg via INTRAVENOUS

## 2019-11-28 MED ORDER — DIPHENHYDRAMINE HCL 50 MG/ML IJ SOLN
25.0000 mg | Freq: Once | INTRAMUSCULAR | Status: AC
  Administered 2019-11-28: 25 mg via INTRAVENOUS

## 2019-11-28 MED ORDER — SODIUM CHLORIDE 0.9 % IV SOLN
Freq: Once | INTRAVENOUS | Status: AC
  Filled 2019-11-28: qty 250

## 2019-11-28 MED ORDER — SODIUM CHLORIDE 0.9% FLUSH
10.0000 mL | INTRAVENOUS | Status: DC | PRN
  Administered 2019-11-28: 10 mL
  Filled 2019-11-28: qty 10

## 2019-11-28 NOTE — Patient Instructions (Signed)
[facility] Discharge Instructions for Patients Receiving Chemotherapy  Today you received the following chemotherapy agents: Etoposide (Vepesid, VP-16)  To help prevent nausea and vomiting after your treatment, we encourage you to take your nausea medication as directed.   If you develop nausea and vomiting that is not controlled by your nausea medication, call the clinic.   BELOW ARE SYMPTOMS THAT SHOULD BE REPORTED IMMEDIATELY:  *FEVER GREATER THAN 100.5 F  *CHILLS WITH OR WITHOUT FEVER  NAUSEA AND VOMITING THAT IS NOT CONTROLLED WITH YOUR NAUSEA MEDICATION  *UNUSUAL SHORTNESS OF BREATH  *UNUSUAL BRUISING OR BLEEDING  TENDERNESS IN MOUTH AND THROAT WITH OR WITHOUT PRESENCE OF ULCERS  *URINARY PROBLEMS  *BOWEL PROBLEMS  UNUSUAL RASH Items with * indicate a potential emergency and should be followed up as soon as possible.  Feel free to call the clinic should you have any questions or concerns. The clinic phone number is [phone].  Please show the [facility] at check-in to the Emergency Department and triage nurse.  Coronavirus (COVID-19) Are you at risk?  Are you at risk for the Coronavirus (COVID-19)?  To be considered HIGH RISK for Coronavirus (COVID-19), you have to meet the following criteria:  . Traveled to [place], [place], [place], [place] or [place]; or in the [place] to [place], [place], [place], or [place]; and have fever, cough, and shortness of breath within the last 2 weeks of travel OR . Been in close contact with a person diagnosed with COVID-19 within the last 2 weeks and have fever, cough, and shortness of breath . IF YOU DO NOT MEET THESE CRITERIA, YOU ARE CONSIDERED LOW RISK FOR COVID-19.  What to do if you are HIGH RISK for COVID-19?  Marland Kitchen If you are having a medical emergency, call 911. . Seek medical care right away. Before you go to a doctor's office, urgent care or emergency department, call ahead and tell  them about your recent travel, contact with someone diagnosed with COVID-19, and your symptoms. You should receive instructions from your physician's office regarding next steps of care.  . When you arrive at healthcare provider, tell the healthcare staff immediately you have returned from visiting [place], [place], [place], [place] or [place]; or traveled in the [place] to [place], [place], [place], or [place]; in the last two weeks or you have been in close contact with a person diagnosed with COVID-19 in the last 2 weeks.   . Tell the health care staff about your symptoms: fever, cough and shortness of breath. . After you have been seen by a medical provider, you will be either: o Tested for (COVID-19) and discharged home on quarantine except to seek medical care if symptoms worsen, and asked to  - Stay home and avoid contact with others until you get your results (4-5 days)  - Avoid travel on public transportation if possible (such as bus, train, or airplane) or o Sent to the Emergency Department by EMS for evaluation, COVID-19 testing, and possible admission depending on your condition and test results.  What to do if you are LOW RISK for COVID-19?  Reduce your risk of any infection by using the same precautions used for avoiding the common cold or flu:  Marland Kitchen Wash your hands often with soap and warm water for at least 20 seconds.  If soap and water are not readily available, use an alcohol-based hand sanitizer with at least 60% alcohol.  . If coughing  or sneezing, cover your mouth and nose by coughing or sneezing into the elbow areas of your shirt or coat, into a tissue or into your sleeve (not your hands). . Avoid shaking hands with others and consider head nods or verbal greetings only. . Avoid touching your eyes, nose, or mouth with unwashed hands.  . Avoid close contact with people who are sick. . Avoid places or events with large numbers of people in one location, like concerts or  sporting events. . Carefully consider travel plans you have or are making. . If you are planning any travel outside or inside the [place], visit the CDC's Travelers' Health webpage for the latest health notices. . If you have some symptoms but not all symptoms, continue to monitor at home and seek medical attention if your symptoms worsen. . If you are having a medical emergency, call 911.   [place] / e-Visit: [email or web address]         [facility]: [facility]: [phone]                   [facility]: [phone]

## 2019-11-28 NOTE — Progress Notes (Signed)
MD Irene Limbo would like to change Carboplatin dose to 500 mg   Larene Beach, PharmD

## 2019-11-28 NOTE — Progress Notes (Signed)
Per MD Irene Limbo, ok to treat with mild thrombocytopenia and labs today

## 2019-11-29 ENCOUNTER — Inpatient Hospital Stay: Payer: Medicaid Other

## 2019-11-29 ENCOUNTER — Other Ambulatory Visit: Payer: Self-pay

## 2019-11-29 VITALS — BP 135/68 | HR 57 | Temp 99.1°F | Resp 17

## 2019-11-29 DIAGNOSIS — Z5111 Encounter for antineoplastic chemotherapy: Secondary | ICD-10-CM | POA: Diagnosis not present

## 2019-11-29 DIAGNOSIS — C787 Secondary malignant neoplasm of liver and intrahepatic bile duct: Secondary | ICD-10-CM

## 2019-11-29 DIAGNOSIS — Z7189 Other specified counseling: Secondary | ICD-10-CM

## 2019-11-29 MED ORDER — DEXAMETHASONE SODIUM PHOSPHATE 10 MG/ML IJ SOLN
10.0000 mg | Freq: Once | INTRAMUSCULAR | Status: AC
  Administered 2019-11-29: 10 mg via INTRAVENOUS

## 2019-11-29 MED ORDER — SODIUM CHLORIDE 0.9 % IV SOLN
75.0000 mg/m2 | Freq: Once | INTRAVENOUS | Status: AC
  Administered 2019-11-29: 140 mg via INTRAVENOUS
  Filled 2019-11-29: qty 7

## 2019-11-29 MED ORDER — DEXAMETHASONE SODIUM PHOSPHATE 10 MG/ML IJ SOLN
INTRAMUSCULAR | Status: AC
  Filled 2019-11-29: qty 1

## 2019-11-29 MED ORDER — SODIUM CHLORIDE 0.9 % IV SOLN
Freq: Once | INTRAVENOUS | Status: AC
  Filled 2019-11-29: qty 250

## 2019-11-29 MED ORDER — HEPARIN SOD (PORK) LOCK FLUSH 100 UNIT/ML IV SOLN
500.0000 [IU] | Freq: Once | INTRAVENOUS | Status: AC | PRN
  Administered 2019-11-29: 10:00:00 500 [IU]
  Filled 2019-11-29: qty 5

## 2019-11-29 MED ORDER — SODIUM CHLORIDE 0.9% FLUSH
10.0000 mL | INTRAVENOUS | Status: DC | PRN
  Administered 2019-11-29: 10 mL
  Filled 2019-11-29: qty 10

## 2019-11-29 NOTE — Patient Instructions (Signed)
[facility] Discharge Instructions for Patients Receiving Chemotherapy  Today you received the following chemotherapy agents: Etoposide.  To help prevent nausea and vomiting after your treatment, we encourage you to take your nausea medication as directed.   If you develop nausea and vomiting that is not controlled by your nausea medication, call the clinic.   BELOW ARE SYMPTOMS THAT SHOULD BE REPORTED IMMEDIATELY:  *FEVER GREATER THAN 100.5 F  *CHILLS WITH OR WITHOUT FEVER  NAUSEA AND VOMITING THAT IS NOT CONTROLLED WITH YOUR NAUSEA MEDICATION  *UNUSUAL SHORTNESS OF BREATH  *UNUSUAL BRUISING OR BLEEDING  TENDERNESS IN MOUTH AND THROAT WITH OR WITHOUT PRESENCE OF ULCERS  *URINARY PROBLEMS  *BOWEL PROBLEMS  UNUSUAL RASH Items with * indicate a potential emergency and should be followed up as soon as possible.  Feel free to call the clinic should you have any questions or concerns. The clinic phone number is [phone].  Please show the [facility] at check-in to the Emergency Department and triage nurse.

## 2019-11-30 ENCOUNTER — Other Ambulatory Visit: Payer: Self-pay

## 2019-11-30 ENCOUNTER — Inpatient Hospital Stay: Payer: Medicaid Other

## 2019-11-30 ENCOUNTER — Telehealth: Payer: Self-pay | Admitting: Hematology

## 2019-11-30 VITALS — BP 135/65 | HR 64 | Temp 98.7°F | Resp 16

## 2019-11-30 DIAGNOSIS — Z7189 Other specified counseling: Secondary | ICD-10-CM

## 2019-11-30 DIAGNOSIS — Z5111 Encounter for antineoplastic chemotherapy: Secondary | ICD-10-CM | POA: Diagnosis not present

## 2019-11-30 DIAGNOSIS — C787 Secondary malignant neoplasm of liver and intrahepatic bile duct: Secondary | ICD-10-CM

## 2019-11-30 MED ORDER — SODIUM CHLORIDE 0.9 % IV SOLN
Freq: Once | INTRAVENOUS | Status: AC
  Filled 2019-11-30: qty 250

## 2019-11-30 MED ORDER — DEXAMETHASONE SODIUM PHOSPHATE 10 MG/ML IJ SOLN
10.0000 mg | Freq: Once | INTRAMUSCULAR | Status: AC
  Administered 2019-11-30: 10 mg via INTRAVENOUS

## 2019-11-30 MED ORDER — SODIUM CHLORIDE 0.9% FLUSH
10.0000 mL | INTRAVENOUS | Status: DC | PRN
  Administered 2019-11-30: 10 mL
  Filled 2019-11-30: qty 10

## 2019-11-30 MED ORDER — SODIUM CHLORIDE 0.9 % IV SOLN
75.0000 mg/m2 | Freq: Once | INTRAVENOUS | Status: AC
  Administered 2019-11-30: 140 mg via INTRAVENOUS
  Filled 2019-11-30: qty 7

## 2019-11-30 MED ORDER — DEXAMETHASONE SODIUM PHOSPHATE 10 MG/ML IJ SOLN
INTRAMUSCULAR | Status: AC
  Filled 2019-11-30: qty 1

## 2019-11-30 MED ORDER — HEPARIN SOD (PORK) LOCK FLUSH 100 UNIT/ML IV SOLN
500.0000 [IU] | Freq: Once | INTRAVENOUS | Status: AC | PRN
  Administered 2019-11-30: 10:00:00 500 [IU]
  Filled 2019-11-30: qty 5

## 2019-11-30 NOTE — Telephone Encounter (Signed)
Scheduled per 01/25 los, patient has been called and notified.

## 2019-11-30 NOTE — Patient Instructions (Signed)
[facility] Discharge Instructions for Patients Receiving Chemotherapy  Today you received the following chemotherapy agents: etoposide  To help prevent nausea and vomiting after your treatment, we encourage you to take your nausea medication as directed.   If you develop nausea and vomiting that is not controlled by your nausea medication, call the clinic.   BELOW ARE SYMPTOMS THAT SHOULD BE REPORTED IMMEDIATELY:  *FEVER GREATER THAN 100.5 F  *CHILLS WITH OR WITHOUT FEVER  NAUSEA AND VOMITING THAT IS NOT CONTROLLED WITH YOUR NAUSEA MEDICATION  *UNUSUAL SHORTNESS OF BREATH  *UNUSUAL BRUISING OR BLEEDING  TENDERNESS IN MOUTH AND THROAT WITH OR WITHOUT PRESENCE OF ULCERS  *URINARY PROBLEMS  *BOWEL PROBLEMS  UNUSUAL RASH Items with * indicate a potential emergency and should be followed up as soon as possible.  Feel free to call the clinic should you have any questions or concerns. The clinic phone number is [phone].  Please show the CHEMO ALERT CARD at check-in to the Emergency Department and triage nurse.   

## 2019-12-02 ENCOUNTER — Inpatient Hospital Stay: Payer: Medicaid Other

## 2019-12-02 ENCOUNTER — Other Ambulatory Visit: Payer: Self-pay

## 2019-12-02 DIAGNOSIS — Z7189 Other specified counseling: Secondary | ICD-10-CM

## 2019-12-02 DIAGNOSIS — Z5111 Encounter for antineoplastic chemotherapy: Secondary | ICD-10-CM | POA: Diagnosis not present

## 2019-12-02 DIAGNOSIS — C801 Malignant (primary) neoplasm, unspecified: Secondary | ICD-10-CM

## 2019-12-02 DIAGNOSIS — C787 Secondary malignant neoplasm of liver and intrahepatic bile duct: Secondary | ICD-10-CM

## 2019-12-02 MED ORDER — PEGFILGRASTIM-JMDB 6 MG/0.6ML ~~LOC~~ SOSY
PREFILLED_SYRINGE | SUBCUTANEOUS | Status: AC
  Filled 2019-12-02: qty 0.6

## 2019-12-02 MED ORDER — PEGFILGRASTIM-JMDB 6 MG/0.6ML ~~LOC~~ SOSY
6.0000 mg | PREFILLED_SYRINGE | Freq: Once | SUBCUTANEOUS | Status: AC
  Administered 2019-12-02: 6 mg via SUBCUTANEOUS

## 2019-12-02 NOTE — Patient Instructions (Signed)

## 2019-12-12 ENCOUNTER — Other Ambulatory Visit: Payer: Self-pay

## 2019-12-12 ENCOUNTER — Ambulatory Visit (HOSPITAL_COMMUNITY)
Admission: RE | Admit: 2019-12-12 | Discharge: 2019-12-12 | Disposition: A | Discharge status: Home / Self Care | Payer: Medicaid Other | Source: Ambulatory Visit | Attending: Hematology | Admitting: Hematology

## 2019-12-12 DIAGNOSIS — C7951 Secondary malignant neoplasm of bone: Secondary | ICD-10-CM | POA: Diagnosis present

## 2019-12-12 DIAGNOSIS — Z5111 Encounter for antineoplastic chemotherapy: Secondary | ICD-10-CM | POA: Insufficient documentation

## 2019-12-12 DIAGNOSIS — C787 Secondary malignant neoplasm of liver and intrahepatic bile duct: Secondary | ICD-10-CM | POA: Insufficient documentation

## 2019-12-12 DIAGNOSIS — C801 Malignant (primary) neoplasm, unspecified: Secondary | ICD-10-CM | POA: Insufficient documentation

## 2019-12-12 MED ORDER — IOHEXOL 9 MG/ML PO SOLN
ORAL | Status: AC
  Administered 2019-12-12: 08:00:00 500 mL
  Filled 2019-12-12: qty 1000

## 2019-12-12 MED ORDER — SODIUM CHLORIDE (PF) 0.9 % IJ SOLN
INTRAMUSCULAR | Status: AC
  Filled 2019-12-12: qty 50

## 2019-12-12 MED ORDER — IOHEXOL 300 MG/ML  SOLN
100.0000 mL | Freq: Once | INTRAMUSCULAR | Status: AC | PRN
  Administered 2019-12-12: 100 mL via INTRAVENOUS

## 2019-12-13 ENCOUNTER — Other Ambulatory Visit: Payer: Self-pay | Admitting: Hematology

## 2019-12-13 DIAGNOSIS — Z7189 Other specified counseling: Secondary | ICD-10-CM

## 2019-12-13 DIAGNOSIS — C787 Secondary malignant neoplasm of liver and intrahepatic bile duct: Secondary | ICD-10-CM

## 2019-12-14 MED ORDER — LORAZEPAM 0.5 MG PO TABS: 0.5000 mg | ORAL_TABLET | Freq: Four times a day (QID) | ORAL | 0 refills | Status: DC | PRN

## 2019-12-14 MED ORDER — OXYCODONE HCL 5 MG PO TABS: 5.0000 mg | ORAL_TABLET | ORAL | 0 refills | Status: DC | PRN

## 2019-12-14 NOTE — Telephone Encounter (Signed)
Refills requested.

## 2019-12-19 ENCOUNTER — Inpatient Hospital Stay: Payer: Medicaid Other

## 2019-12-19 ENCOUNTER — Other Ambulatory Visit: Payer: Self-pay

## 2019-12-19 ENCOUNTER — Inpatient Hospital Stay (HOSPITAL_BASED_OUTPATIENT_CLINIC_OR_DEPARTMENT_OTHER): Payer: Medicaid Other | Admitting: Hematology

## 2019-12-19 ENCOUNTER — Inpatient Hospital Stay: Payer: Medicaid Other | Attending: Hematology

## 2019-12-19 VITALS — BP 146/70 | HR 100 | Temp 99.2°F | Resp 18 | Ht 67.0 in | Wt 165.1 lb

## 2019-12-19 DIAGNOSIS — R945 Abnormal results of liver function studies: Secondary | ICD-10-CM | POA: Diagnosis not present

## 2019-12-19 DIAGNOSIS — C7B03 Secondary carcinoid tumors of bone: Secondary | ICD-10-CM | POA: Insufficient documentation

## 2019-12-19 DIAGNOSIS — G893 Neoplasm related pain (acute) (chronic): Secondary | ICD-10-CM

## 2019-12-19 DIAGNOSIS — C787 Secondary malignant neoplasm of liver and intrahepatic bile duct: Secondary | ICD-10-CM

## 2019-12-19 DIAGNOSIS — C801 Malignant (primary) neoplasm, unspecified: Secondary | ICD-10-CM

## 2019-12-19 DIAGNOSIS — Z5111 Encounter for antineoplastic chemotherapy: Secondary | ICD-10-CM | POA: Diagnosis not present

## 2019-12-19 DIAGNOSIS — C7B02 Secondary carcinoid tumors of liver: Secondary | ICD-10-CM | POA: Diagnosis not present

## 2019-12-19 DIAGNOSIS — C7A1 Malignant poorly differentiated neuroendocrine tumors: Secondary | ICD-10-CM | POA: Diagnosis present

## 2019-12-19 DIAGNOSIS — Z7189 Other specified counseling: Secondary | ICD-10-CM

## 2019-12-19 DIAGNOSIS — Z95828 Presence of other vascular implants and grafts: Secondary | ICD-10-CM

## 2019-12-19 DIAGNOSIS — C7951 Secondary malignant neoplasm of bone: Secondary | ICD-10-CM

## 2019-12-19 DIAGNOSIS — Z79899 Other long term (current) drug therapy: Secondary | ICD-10-CM | POA: Diagnosis not present

## 2019-12-19 LAB — CBC WITH DIFFERENTIAL/PLATELET
Abs Immature Granulocytes: 2.83 10*3/uL — ABNORMAL HIGH (ref 0.00–0.07)
Basophils Absolute: 0.1 10*3/uL (ref 0.0–0.1)
Basophils Relative: 0 %
Eosinophils Absolute: 0.1 10*3/uL (ref 0.0–0.5)
Eosinophils Relative: 1 %
HCT: 31.4 % — ABNORMAL LOW (ref 39.0–52.0)
Hemoglobin: 9.9 g/dL — ABNORMAL LOW (ref 13.0–17.0)
Immature Granulocytes: 12 %
Lymphocytes Relative: 7 %
Lymphs Abs: 1.7 10*3/uL (ref 0.7–4.0)
MCH: 27.4 pg (ref 26.0–34.0)
MCHC: 31.5 g/dL (ref 30.0–36.0)
MCV: 87 fL (ref 80.0–100.0)
Monocytes Absolute: 1.8 10*3/uL — ABNORMAL HIGH (ref 0.1–1.0)
Monocytes Relative: 8 %
Neutro Abs: 17.3 10*3/uL — ABNORMAL HIGH (ref 1.7–7.7)
Neutrophils Relative %: 72 %
Platelets: 125 10*3/uL — ABNORMAL LOW (ref 150–400)
RBC: 3.61 MIL/uL — ABNORMAL LOW (ref 4.22–5.81)
RDW: 17.1 % — ABNORMAL HIGH (ref 11.5–15.5)
WBC: 23.8 10*3/uL — ABNORMAL HIGH (ref 4.0–10.5)
nRBC: 0 % (ref 0.0–0.2)

## 2019-12-19 LAB — CMP (CANCER CENTER ONLY)
ALT: 13 U/L (ref 0–44)
AST: 20 U/L (ref 15–41)
Albumin: 3.4 g/dL — ABNORMAL LOW (ref 3.5–5.0)
Alkaline Phosphatase: 137 U/L — ABNORMAL HIGH (ref 38–126)
Anion gap: 9 (ref 5–15)
BUN: 17 mg/dL (ref 6–20)
CO2: 24 mmol/L (ref 22–32)
Calcium: 8.8 mg/dL — ABNORMAL LOW (ref 8.9–10.3)
Chloride: 105 mmol/L (ref 98–111)
Creatinine: 1.09 mg/dL (ref 0.61–1.24)
GFR, Est AFR Am: >60 mL/min (ref >60)
GFR, Estimated: >60 mL/min (ref >60)
Glucose, Bld: 208 mg/dL — ABNORMAL HIGH (ref 70–99)
Potassium: 3.9 mmol/L (ref 3.5–5.1)
Sodium: 138 mmol/L (ref 135–145)
Total Bilirubin: 0.4 mg/dL (ref 0.3–1.2)
Total Protein: 8.1 g/dL (ref 6.5–8.1)

## 2019-12-19 LAB — LACTATE DEHYDROGENASE: LDH: 305 U/L — ABNORMAL HIGH (ref 98–192)

## 2019-12-19 MED ORDER — SODIUM CHLORIDE 0.9 % IV SOLN
Freq: Once | INTRAVENOUS | Status: AC
  Filled 2019-12-19: qty 250

## 2019-12-19 MED ORDER — PALONOSETRON HCL INJECTION 0.25 MG/5ML
0.2500 mg | Freq: Once | INTRAVENOUS | Status: AC
  Administered 2019-12-19: 0.25 mg via INTRAVENOUS

## 2019-12-19 MED ORDER — SODIUM CHLORIDE 0.9 % IV SOLN
486.0000 mg | Freq: Once | INTRAVENOUS | Status: AC
  Administered 2019-12-19: 490 mg via INTRAVENOUS
  Filled 2019-12-19: qty 49

## 2019-12-19 MED ORDER — DEXAMETHASONE SODIUM PHOSPHATE 10 MG/ML IJ SOLN
INTRAMUSCULAR | Status: AC
  Filled 2019-12-19: qty 1

## 2019-12-19 MED ORDER — FAMOTIDINE IN NACL 20-0.9 MG/50ML-% IV SOLN
20.0000 mg | Freq: Once | INTRAVENOUS | Status: AC
  Administered 2019-12-19: 20 mg via INTRAVENOUS

## 2019-12-19 MED ORDER — DIPHENHYDRAMINE HCL 50 MG/ML IJ SOLN
25.0000 mg | Freq: Once | INTRAMUSCULAR | Status: AC
  Administered 2019-12-19: 25 mg via INTRAVENOUS

## 2019-12-19 MED ORDER — DEXAMETHASONE SODIUM PHOSPHATE 10 MG/ML IJ SOLN
10.0000 mg | Freq: Once | INTRAMUSCULAR | Status: AC
  Administered 2019-12-19: 10 mg via INTRAVENOUS

## 2019-12-19 MED ORDER — HEPARIN SOD (PORK) LOCK FLUSH 100 UNIT/ML IV SOLN
500.0000 [IU] | Freq: Once | INTRAVENOUS | Status: AC | PRN
  Administered 2019-12-19: 500 [IU]
  Filled 2019-12-19: qty 5

## 2019-12-19 MED ORDER — DIPHENHYDRAMINE HCL 50 MG/ML IJ SOLN
INTRAMUSCULAR | Status: AC
  Filled 2019-12-19: qty 1

## 2019-12-19 MED ORDER — SODIUM CHLORIDE 0.9% FLUSH
10.0000 mL | INTRAVENOUS | Status: DC | PRN
  Administered 2019-12-19: 10 mL
  Filled 2019-12-19: qty 10

## 2019-12-19 MED ORDER — SODIUM CHLORIDE 0.9 % IV SOLN
75.0000 mg/m2 | Freq: Once | INTRAVENOUS | Status: AC
  Administered 2019-12-19: 140 mg via INTRAVENOUS
  Filled 2019-12-19: qty 7

## 2019-12-19 MED ORDER — FAMOTIDINE IN NACL 20-0.9 MG/50ML-% IV SOLN
INTRAVENOUS | Status: AC
  Filled 2019-12-19: qty 50

## 2019-12-19 MED ORDER — PALONOSETRON HCL INJECTION 0.25 MG/5ML
INTRAVENOUS | Status: AC
  Filled 2019-12-19: qty 5

## 2019-12-19 MED ORDER — SODIUM CHLORIDE 0.9 % IV SOLN
150.0000 mg | Freq: Once | INTRAVENOUS | Status: AC
  Administered 2019-12-19: 150 mg via INTRAVENOUS
  Filled 2019-12-19: qty 150

## 2019-12-19 NOTE — Progress Notes (Signed)
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  HISTORY OF PRESENTING ILLNESS:   [name] is a wonderful 59 y.o. male who has been referred to [place] by Dr. Francine Graven for evaluation and management of Liver masses. He is accompanied today by his wife and aunt. The pt reports that he is doing well overall.   The pt presented to the ED on 10/01/18 regarding persistent RUQ abdominal pain which began a month prior, and noted associations with nausea and 40 pound weight loss. He was evaluated with a CT A/P, as noted below, which revealed liver masses.   The pt reports that he first began feeling RUQ abdominal pain about 3-4 weeks ago, but began losing weight about 2 months ago. He notes that he began feeling more fatigue about 2 months ago as well.  The pt notes that the color of his bowels became dark black and denies taking Iron pills or bismuth medications. He notes that he began feeling constipated as well, feeling backed up. He notes that his stools began looking thinner in caliber. The pt notes that his appetite began decreasing over the last 3-4 weeks ago, and some of this he attributes to intermittent nausea. He denies problems swallowing food. He endorses RUQ pain, and denies vomiting and new cough, new bone pains, testicular pain or swelling, problems passing urine, blood in the urine. The pt has never had a colonoscopy and does not have a PCP, and cites his lack of health insurance as the reason.  The pt was released from the ED with Oxycodone and has been using this to treat his RUQ pain successfully.   The pt notes gout and a heart murmur as previous medical history. He notes that he hasn't consumed alcohol in the past 2 years whatsoever, but prior to this alcohol was a problem for  him.   Of note prior to the patient's visit today, pt has had a CT A/P completed on 10/01/18 with results revealing Interval development of numerous ill-defined hypodense masses throughout the entire liver compatible with metastatic disease. There is a 2.5 cm enhancing mass over the upper pole cortex of the right kidney suspicious for renal cell carcinoma. 2.  No acute findings in the abdomen/pelvis. 3. 1 cm hypodensity over the pancreatic tail not well seen on previous noncontrast exams. MR may be helpful for further evaluation on an elective basis.   Most recent lab results (10/06/18) of CBC w/diff and CMP is as follows: all values are WNL except for Monocytes abs at 1.2k, Sodium at 134, Albumin at 3.0, AST at 77, ALT at 63, Alk Phos at 392, Total Bilirubin at 2.3   On review of systems, pt reports new constipation, smaller caliber stools, dark stools, unexpected weight loss, RUQ pain, new fatigue, and denies vomiting, cough, new SOB, changes in breathing, new bone pains, leg swelling, blood in the urine, changes in urination, fevers, pain along the spine, and any other symptoms.   On PMHx the pt reports Gout, heart murmur, previous alcohol abuse. He denies any surgeries.  On Social Hx the pt reports that he hasn't consumed alcohol in the past 2 years whatsoever. He notes that alcohol abuse was previously a concern. He denies ever smoking cigarettes or doing drugs. He denies any radiation or chemical exposure.  On Family Hx  the pt reports mother with pancreatic cancer in 24s, maternal aunts with high blood pressure and heart failure, maternal aunt with breast cancer.   INTERVAL HISTORY:  [name] returns today for management, evaluation of his relapsed High grade neuroendocrine carcinoma. We are joined today by his brother, [name], via phone. He is here for C4 of Carboplatin and Etoposide. The patient's last visit with [place] was on 11/28/2019. The pt reports that he is doing well overall.  The pt  reports that he has been feeling and eating well. He has also has continued to walk often to get activity. Pt denies any constipation or urinary changes but notes that he is not having as many bowel movements as he should.   Of note since the patient's last visit, pt has had CT C/A/P ([id number]) ([id number]) completed on 12/12/2019 with results revealing "1. Slight decrease in size of diffuse hepatic metastases. Decrease in size of anterior right upper lobe nodule. Osseous metastatic disease is grossly stable. 2. Lesions in the upper pole right kidney and pancreatic tail, stable and worrisome for primary malignancy or metastatic disease. 3. Trace ascites. 4. Cirrhosis with portal hypertension."  Lab results today (12/19/19) of CBC w/diff and CMP is as follows: all values are WNL except for WBC at 23.8K, RBC at 3.61, Hgb at 9.9, HCT at 31.4, RDW at 17.1, PLT 125K, Neutro Abs at 17.3K, Mono Abs at 1.8K, Abs Immature Granulocytes at 2.83K, Glucose at 208, Calcium at 8.8, Albumin at 3.4, Alkaline Phosphatase at 137. 12/19/2019 LDH at 305  On review of systems, pt denies fevers, chills, night sweats, abdominal pain, constipation, urinary changes, SOB, changes in breathing, bleeding concerns and any other symptoms.   MEDICAL HISTORY:  Past Medical History:  Diagnosis Date  . Arthritis    "knees; left hand"  . Cancer ([place])   . Gout   . Heart murmur    Longstanding. 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 Hypertension   . Near syncope    05/2011 with tachypalpitations & with echo showing mild LVH, normal EF 55-60%, trivial AR    SURGICAL HISTORY: Past Surgical History:  Procedure Laterality Date  . IR IMAGING GUIDED PORT INSERTION  10/26/2018  . NO PAST SURGERIES      SOCIAL HISTORY: Social History   Socioeconomic History  . Marital status: Married    Spouse name: Not on file  . Number of children: 1  . Years of education: Not on file  . Highest education level:  Not on file  Occupational History  . Occupation: Unemployed  Tobacco Use  . Smoking status: Never Smoker  . Smokeless tobacco: Never Used  Substance and Sexual Activity  . Alcohol use: Not Currently    Comment: states he has cut back/ every now and again  . Drug use: No  . Sexual activity: Yes    Partners: Female  Other Topics Concern  . Not on file  Social History Narrative   Married. 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 Is from [place].    Social Determinants of Health   Financial Resource Strain:   . Difficulty of Paying Living Expenses: Not on file  Food Insecurity:   . Worried About Charity fundraiser in the Last Year: Not on file  . Ran Out of Food in the Last Year: Not on file  Transportation Needs:   . Lack of Transportation (Medical): Not on file  .  Lack of Transportation (Non-Medical): Not on file  Physical Activity:   . Days of Exercise per Week: Not on file  . Minutes of Exercise per Session: Not on file  Stress:   . Feeling of Stress : Not on file  Social Connections:   . Frequency of Communication with Friends and Family: Not on file  . Frequency of Social Gatherings with Friends and Family: Not on file  . Attends Religious Services: Not on file  . Active Member of Clubs or Organizations: Not on file  . Attends Archivist Meetings: Not on file  . Marital Status: Not on file  Intimate Partner Violence:   . Fear of Current or Ex-Partner: Not on file  . Emotionally Abused: Not on file  . Physically Abused: Not on file  . Sexually Abused: Not on file    FAMILY HISTORY: Family History  Problem Relation Age of Onset  . Cancer Mother        Possibly pancreatic. Died at age 71  . Prostate cancer Father   . Breast cancer Cousin   . Diabetes Paternal Uncle   . Heart disease Paternal Uncle     ALLERGIES:  has No Known Allergies.  MEDICATIONS:  Current Outpatient Medications  Medication Sig  Dispense Refill  . dexamethasone (DECADRON) 4 MG tablet Take 2 tablets (8 mg total) by mouth daily. Start the day after chemotherapy for 1 day. 30 tablet 1  . lidocaine-prilocaine (EMLA) cream Apply to affected area once 30 g 3  . LORazepam (ATIVAN) 0.5 MG tablet Take 1 tablet (0.5 mg total) by mouth every 6 (six) hours as needed (Nausea or vomiting). 45 tablet 0  . ondansetron (ZOFRAN) 8 MG tablet Take 1 tablet (8 mg total) by mouth 2 (two) times daily as needed for refractory nausea / vomiting. Start on day 3 after carboplatin chemo. 30 tablet 1  . oxyCODONE (OXY IR/ROXICODONE) 5 MG immediate release tablet Take 1-2 tablets (5-10 mg total) by mouth every 4 (four) hours as needed for severe pain. 120 tablet 0  . senna-docusate (SENNA S) 8.6-50 MG tablet Take 2 tablets by mouth at bedtime. 60 tablet 1  . allopurinol (ZYLOPRIM) 100 MG tablet Take 2 tablets (200 mg total) by mouth daily. (Patient not taking: Reported on 11/28/2019) 60 tablet 1  . colchicine 0.6 MG tablet Take 1 tablet (0.6 mg total) by mouth See admin instructions. Take 1.529m (2 tablets) followed by .682m(1 tablet) 1 hour later. (Patient not taking: Reported on 11/28/2019) 3 tablet 0  . dexamethasone (DECADRON) 4 MG tablet 29m629mo daily with breakfast for 2 weeks then every other day for 2 weeks (Patient not taking: Reported on 12/19/2019) 30 tablet 0  . furosemide (LASIX) 20 MG tablet Take 1 tablet (20 mg total) by mouth daily. (Patient not taking: Reported on 11/28/2019) 30 tablet 1  . ondansetron (ZOFRAN ODT) 4 MG disintegrating tablet Take 1 tablet (4 mg total) by mouth every 8 (eight) hours as needed for nausea or vomiting. (Patient not taking: Reported on 11/28/2019) 6 tablet 0  . potassium chloride SA (K-DUR,KLOR-CON) 20 MEQ tablet Take 1 tablet (20 mEq total) by mouth daily. With lasix (Patient not taking: Reported on 11/28/2019) 30 tablet 1  . prochlorperazine (COMPAZINE) 10 MG tablet Take 1 tablet (10 mg total) by mouth every 6 (six)  hours as needed (Nausea or vomiting). (Patient not taking: Reported on 11/28/2019) 30 tablet 1   No current facility-administered medications for this visit.  Facility-Administered Medications Ordered in Other Visits  Medication Dose Route Frequency Provider Last Rate Last Admin  . 0.9 %  sodium chloride infusion   Intravenous Once Brunetta Genera, MD      . CARBOplatin (PARAPLATIN) 490 mg in sodium chloride 0.9 % 250 mL chemo infusion  490 mg Intravenous Once Brunetta Genera, MD      . denosumab (XGEVA) injection 120 mg  120 mg Subcutaneous Once Brunetta Genera, MD      . dexamethasone (DECADRON) injection 10 mg  10 mg Intravenous Once Brunetta Genera, MD      . diphenhydrAMINE (BENADRYL) injection 25 mg  25 mg Intravenous Once Brunetta Genera, MD      . etoposide (VEPESID) 140 mg in sodium chloride 0.9 % 500 mL chemo infusion  75 mg/m2 (Treatment Plan Recorded) Intravenous Once Brunetta Genera, MD      . famotidine (PEPCID) IVPB 20 mg premix  20 mg Intravenous Once Brunetta Genera, MD      . fosaprepitant (EMEND) 150 mg in sodium chloride 0.9 % 145 mL IVPB  150 mg Intravenous Once Brunetta Genera, MD      . heparin lock flush 100 unit/mL  500 Units Intracatheter Once PRN Brunetta Genera, MD      . palonosetron (ALOXI) injection 0.25 mg  0.25 mg Intravenous Once Brunetta Genera, MD      . sodium chloride flush (NS) 0.9 % injection 10 mL  10 mL Intracatheter PRN Brunetta Genera, MD        REVIEW OF SYSTEMS:   A 10+ POINT REVIEW OF SYSTEMS WAS OBTAINED including neurology, dermatology, psychiatry, cardiac, respiratory, lymph, extremities, GI, GU, Musculoskeletal, constitutional, breasts, reproductive, HEENT.  All pertinent positives are noted in the HPI.  All others are negative.   PHYSICAL EXAMINATION: ECOG FS:2 - Symptomatic, <50% confined to bed   Vitals:   12/19/19 0838  BP: (!) 146/70  Pulse: 100  Resp: 18  Temp: 99.2 F (37.3  C)  SpO2: 100%   Wt Readings from Last 3 Encounters:  12/19/19 165 lb 1.6 oz (74.9 kg)  11/28/19 159 lb 9.6 oz (72.4 kg)  11/07/19 157 lb 6.4 oz (71.4 kg)   Body mass index is 25.86 kg/m.    GENERAL:alert, in no acute distress and comfortable SKIN: no acute rashes, no significant lesions EYES: conjunctiva are pink and non-injected, sclera anicteric OROPHARYNX: MMM, no exudates, no oropharyngeal erythema or ulceration NECK: supple, no JVD LYMPH:  no palpable lymphadenopathy in the cervical, axillary or inguinal regions LUNGS: clear to auscultation b/l with normal respiratory effort HEART: regular rate & rhythm ABDOMEN:  normoactive bowel sounds , non tender, not distended. Hepatomegaly 2-3 finger breadths below the costal margin, improved from last visit. Minimally palpable spleen Extremity: no pedal edema PSYCH: alert & oriented x 3 with fluent speech NEURO: no focal motor/sensory deficits  LABORATORY DATA:  I have reviewed the data as listed  . CBC Latest Ref Rng & Units 12/19/2019 11/28/2019 11/07/2019  WBC 4.0 - 10.5 K/uL 23.8(H) 23.5(H) 8.1  Hemoglobin 13.0 - 17.0 g/dL 9.9(L) 10.0(L) 10.6(L)  Hematocrit 39.0 - 52.0 % 31.4(L) 31.4(L) 33.4(L)  Platelets 150 - 400 K/uL 125(L) 98(L) 126(L)   CMP Latest Ref Rng & Units 12/19/2019 11/28/2019 11/09/2019  Glucose 70 - 99 mg/dL 208(H) 114(H) 120(H)  BUN 6 - 20 mg/dL 17 10 23(H)  Creatinine 0.61 - 1.24 mg/dL 1.09 0.95 0.95  Sodium 135 - 145  mmol/L 138 137 139  Potassium 3.5 - 5.1 mmol/L 3.9 3.9 4.0  Chloride 98 - 111 mmol/L 105 106 107  CO2 22 - 32 mmol/L _0 Calcium 8.9 - 10.3 mg/dL 8.8(L) 8.5(L) 8.0(L)  Total Protein 6.5 - 8.1 g/dL 8.1 7.9 7.8  Total Bilirubin 0.3 - 1.2 mg/dL 0.4 0.4 0.6  Alkaline Phos 38 - 126 U/L 137(H) 143(H) 122  AST 15 - 41 U/L _1 ALT 0 - 44 U/L _2 Lab Results  Component Value Date   LDH 305 (H) 12/19/2019    03/31/2019 Mismatch Repair Protein Report   10/08/18 Liver  Biopsy:    RADIOGRAPHIC STUDIES: I have personally reviewed the radiological images as listed and agreed with the findings in the report. CT Chest W Contrast  Result Date: 12/12/2019 CLINICAL DATA:  Gastrointestinal cancer. High-grade neuroendocrine carcinoma with multiple liver metastases. Chemotherapy in progress. EXAM: CT CHEST, ABDOMEN, AND PELVIS WITH CONTRAST TECHNIQUE: Multidetector CT imaging of the chest, abdomen and pelvis was performed following the standard protocol during bolus administration of intravenous contrast. CONTRAST:  131m OMNIPAQUE IOHEXOL 300 MG/ML  SOLN COMPARISON:  10/03/2019.  Bone scan 07/27/2019. FINDINGS: CT CHEST FINDINGS Cardiovascular: Right IJ Port-A-Cath terminates in the right atrium. Atherosclerotic calcification of the aortic valve. Heart is enlarged. No pericardial effusion. Mediastinum/Nodes: No pathologically enlarged mediastinal, hilar or axillary lymph nodes. Esophagus is unremarkable. Lungs/Pleura: 2 mm peripheral right upper lobe nodule (6/56), unchanged. Previously measured 3 mm anterior segment right upper lobe nodule has decreased in size, now measuring 2 mm (6/58). Lungs are otherwise clear. No pleural fluid. Airway is unremarkable. Musculoskeletal: Mildly heterogeneous sclerotic lesions in the spine, manubrium, proximal left humerus and left scapula, as on prior exams. CT ABDOMEN PELVIS FINDINGS Hepatobiliary: Heterogeneous lesions are seen throughout the liver with new areas of associated low-attenuation, indicative of necrosis. Accurate measurement is challenging. Index lesion in the left hepatic lobe measures approximately 3.2 x 4.3 cm (2/49), decreased from 4.4 x 5.2 cm on 10/03/2019. Liver margin is irregular. Well-circumscribed low-attenuation lesion in the inferior right hepatic lobe measures 2.5 cm and is likely a cyst. Gallbladder is unremarkable. Trace perihepatic ascites. Pancreas: Subtle mildly heterogeneous lesion in the tail the pancreas, 1.9  cm, stable. Spleen: Slightly enlarged, 13.1 cm. Adrenals/Urinary Tract: Adrenal glands are unremarkable. Difficult to exclude a hyperattenuating mass off the upper pole right kidney, 2.1 cm (2/66). Kidneys are otherwise unremarkable. Ureters are decompressed. Bladder is grossly unremarkable. Stomach/Bowel: Stomach, small bowel and appendix are unremarkable. Fair amount of stool is seen in the colon, indicative of constipation. Vascular/Lymphatic: Atherosclerotic calcification of the aorta. Gastroesophageal varices. Recanalized paraumbilical vein. No pathologically enlarged lymph nodes. Reproductive: Prostate is visualized. Other: Trace pelvic free fluid. Mesenteries and peritoneum are otherwise unremarkable. Musculoskeletal: Mildly heterogeneous sclerotic lesions in the spine, pelvis and proximal femurs are unchanged. IMPRESSION: 1. Slight decrease in size of diffuse hepatic metastases. Decrease in size of anterior right upper lobe nodule. Osseous metastatic disease is grossly stable. 2. Lesions in the upper pole right kidney and pancreatic tail, stable and worrisome for primary malignancy or metastatic disease. 3. Trace ascites. 4. Cirrhosis with portal hypertension. 5.  Aortic atherosclerosis (ICD10-I70.0). Electronically Signed   By: MLorin PicketM.D.   On: 12/12/2019 11:32   CT Abdomen Pelvis W Contrast  Result Date: 12/12/2019 CLINICAL DATA:  Gastrointestinal cancer. High-grade neuroendocrine carcinoma with multiple liver metastases. Chemotherapy in progress. EXAM: CT CHEST, ABDOMEN, AND PELVIS WITH CONTRAST TECHNIQUE:  Multidetector CT imaging of the chest, abdomen and pelvis was performed following the standard protocol during bolus administration of intravenous contrast. CONTRAST:  157m OMNIPAQUE IOHEXOL 300 MG/ML  SOLN COMPARISON:  10/03/2019.  Bone scan 07/27/2019. FINDINGS: CT CHEST FINDINGS Cardiovascular: Right IJ Port-A-Cath terminates in the right atrium. Atherosclerotic calcification of the  aortic valve. Heart is enlarged. No pericardial effusion. Mediastinum/Nodes: No pathologically enlarged mediastinal, hilar or axillary lymph nodes. Esophagus is unremarkable. Lungs/Pleura: 2 mm peripheral right upper lobe nodule (6/56), unchanged. Previously measured 3 mm anterior segment right upper lobe nodule has decreased in size, now measuring 2 mm (6/58). Lungs are otherwise clear. No pleural fluid. Airway is unremarkable. Musculoskeletal: Mildly heterogeneous sclerotic lesions in the spine, manubrium, proximal left humerus and left scapula, as on prior exams. CT ABDOMEN PELVIS FINDINGS Hepatobiliary: Heterogeneous lesions are seen throughout the liver with new areas of associated low-attenuation, indicative of necrosis. Accurate measurement is challenging. Index lesion in the left hepatic lobe measures approximately 3.2 x 4.3 cm (2/49), decreased from 4.4 x 5.2 cm on 10/03/2019. Liver margin is irregular. Well-circumscribed low-attenuation lesion in the inferior right hepatic lobe measures 2.5 cm and is likely a cyst. Gallbladder is unremarkable. Trace perihepatic ascites. Pancreas: Subtle mildly heterogeneous lesion in the tail the pancreas, 1.9 cm, stable. Spleen: Slightly enlarged, 13.1 cm. Adrenals/Urinary Tract: Adrenal glands are unremarkable. Difficult to exclude a hyperattenuating mass off the upper pole right kidney, 2.1 cm (2/66). Kidneys are otherwise unremarkable. Ureters are decompressed. Bladder is grossly unremarkable. Stomach/Bowel: Stomach, small bowel and appendix are unremarkable. Fair amount of stool is seen in the colon, indicative of constipation. Vascular/Lymphatic: Atherosclerotic calcification of the aorta. Gastroesophageal varices. Recanalized paraumbilical vein. No pathologically enlarged lymph nodes. Reproductive: Prostate is visualized. Other: Trace pelvic free fluid. Mesenteries and peritoneum are otherwise unremarkable. Musculoskeletal: Mildly heterogeneous sclerotic lesions in  the spine, pelvis and proximal femurs are unchanged. IMPRESSION: 1. Slight decrease in size of diffuse hepatic metastases. Decrease in size of anterior right upper lobe nodule. Osseous metastatic disease is grossly stable. 2. Lesions in the upper pole right kidney and pancreatic tail, stable and worrisome for primary malignancy or metastatic disease. 3. Trace ascites. 4. Cirrhosis with portal hypertension. 5.  Aortic atherosclerosis (ICD10-I70.0). Electronically Signed   By: MLorin PicketM.D.   On: 12/12/2019 11:32    ASSESSMENT & PLAN:  59y.o. male with  1. 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 There is a 2.5 cm enhancing mass over the upper pole cortex of the right kidney suspicious for renal cell carcinoma. 2.  No acute findings in the abdomen/pelvis. 3. 1 cm hypodensity over the pancreatic tail not well seen on previous noncontrast exams. MR may be helpful for further evaluation on an elective basis.    10/06/18 Hep B and Hep C were negative   10/06/18 CA19-9 elevated at 652, AFP slightly elevated at 8.8, CEA normal at 2.13, LDH slightly elevated at 215  10/08/18 [place] which revealed High Grade Neuroendocrine Carcinoma  10/15/18 PET/CT revealed Diffuse hepatic and osseous metastatic disease without obvious primary neoplasm. 2. Upper pole right renal lesion is not definitely hypermetabolic and has been present since 2012. I think is unlikely the cause of the metastatic disease. Recommend liver biopsy for tissue diagnosis. If needed, an MRI of the abdomen without and with contrast may be helpful for further evaluation of  the renal lesion. 3. Two sub 3 mm right upper lobe pulmonary nodules are indeterminate.   10/26/18 MRI Brain revealed No acute abnormality and negative for metastatic  disease.  03/15/19 CT C/A/P which revealed "Significant improvement in multifocal liver metastasis. 2. Interval development of multifocal areas of sclerosis within the visualized axial and proximal appendicular skeleton. These are favored to represent areas of healing bone metastases. 3. Stable lesion within distal tail of pancreas. 4. Aortic Atherosclerosis."   06/19/2019 CT abdomen and pelvis with results revealing "Widespread hepatic metastasis. Individual lesions are difficult to measure however the overall size of the liver is increased significantly and therefore findings are consistent with progression of hepatic metastasis compared to CT of 03/15/2019). No bowel obstruction. No abdominopelvic lymphadenopathy. Stable enhancing small RIGHT renal mass. Stable sclerotic skeletal metastasis."  07/27/2019 CT of the chest abdomen and pelvis with results revealing "Redemonstrated hepatomegaly with numerous bulky hypodense metastatic lesions, generally ill-defined and not significantly changed in size compared to prior examination, a partially exophytic index lesion of the anterior right lobe of the liver measuring 3.3 cm, previously 3.3 cm (series 2, image 58), an index lesion of the lateral right lobe of the liver measuring 5.6 cm, previously 5.6 cm (series 2, image 56), an index lesion of the anterior left lobe of the liver measuring 4.3 cm, previously 4.5 cm (series 2, image 50). Unchanged sclerotic osseous lesions, including of the T12 vertebral body, L4 vertebral body and bilateral proximal femurs (series 4, image 94, 85, 67, 70). Findings are consistent with unchanged metastatic disease without new evidence of metastasis in the chest, abdomen, or pelvis. There is a redemonstrated, exophytic small mass of the superior pole of the right kidney, best appreciated on coronal series measuring approximately 1.9 x 1.4 cm (series 4, image 87), not significantly changed compared to prior examinations and remain  concerning for a small incidental renal cell carcinoma. Aortic Atherosclerosis (ICD10-I70.0) and Emphysema (ICD10-J43.9)."    S/p 6 cycles of Carboplatin and 20% dose reduced Etoposide completed on 02/19/19. Dose reduced as pt developed significant thrombocytopenia after C1.  10/03/2019 CT C/A/P ([id number]) ([id number]) shows disease progression in the liver and a minimal enlargement of a kidney lesion  12/12/2019 CT C/A/P ([id number]) ([id number]) revealed "1. Slight decrease in size of diffuse hepatic metastases. Decrease in size of anterior right upper lobe nodule. Osseous metastatic disease is grossly stable. 2. Lesions in the upper pole right kidney and pancreatic tail, stable and worrisome for primary malignancy or metastatic disease. 3. Trace ascites. 4. Cirrhosis with portal hypertension."   2. Abnormal LFTs - likely from liver metastases  Labs upon initial presentation from 10/06/18, some borderline monocytosis at 1.2k, otherwise normal blood counts, AST at 77, ALT at 63, Alk Phos at 392, Total Bilirubin at 2.3   3. Rt renal mass -  seen on 10/15/18 PET/CT but no longer observed on 03/15/19 CT C/A/P  4.Scattered osseous metastases-  07/27/2019 whole body bone scan with results revealing Scattered osseous metastases  as above, including proximal LEFT humerus, RIGHT humeral diaphysis, and BILATERAL femoral diaphyses.    PLAN: -Discussed pt labwork today, 12/19/19; Anemia is stable, PLT improved, blood chemistries are stable  -Discussed 12/19/2019 LDH is slightly higher at 305 -Discussed 12/12/2019 CT C/A/P ([id number]) ([id number]) which revealed some response in the liver, metastatic disease is stable, lesions in liver seem to have shrunken in size, kidney and pancreas lesions could be metastatic disease or primary malignancy -Advised pt that kidney and pancreas lesions would need to  be biopsied to determine whether it is primary disease or metastatic -Would not recommend biopsies at  this time while on active treatment, also knowing that metastatic disease will likely be the limiting factor -Will finish C4 today and watch with labs and repeat scans in a few months -Advised pt that going beyond 4 cycles may not produce increased response  -The pt has no prohibitive toxicities from continuing C4D1 Carboplatin & Etoposide at this time.  -Continue lowered Etoposide dosage at 75 mg/m^2 for C4D1 due to concern for thrombocytopenia caused by decreased liver function -continue current pain management. -Continue Alphonse Guild -Will get CT C/A/P in 8 weeks -Will see back in 2 months with labs     FOLLOW UP: Labs and CT chest/abd/pelvis in 8 weeks MD visit in 2 months  . The total time spent in the appointment was 30 minutes and more than 50% was on counseling and direct patient cares.    All of the patient's questions were answered with apparent satisfaction. The patient knows to call the clinic with any problems, questions or concerns.    Sullivan Lone MD [place] AAHIVMS [facility] Hematology/Oncology Physician [facility]  (Office):       [phone] (Work cell):  [phone] (Fax):           [phone]  12/19/2019 9:52 AM  I, Yevette Edwards, am acting as a scribe for Dr. Sullivan Lone.   .I have reviewed the above documentation for accuracy and completeness, and I agree with the above. Brunetta Genera MD

## 2019-12-19 NOTE — Patient Instructions (Signed)
[place] Discharge Instructions for Patients Receiving Chemotherapy  Today you received the following chemotherapy agents: carboplatin and etoposide.  To help prevent nausea and vomiting after your treatment, we encourage you to take your nausea medication as directed.   If you develop nausea and vomiting that is not controlled by your nausea medication, call the clinic.   BELOW ARE SYMPTOMS THAT SHOULD BE REPORTED IMMEDIATELY:  *FEVER GREATER THAN 100.5 F  *CHILLS WITH OR WITHOUT FEVER  NAUSEA AND VOMITING THAT IS NOT CONTROLLED WITH YOUR NAUSEA MEDICATION  *UNUSUAL SHORTNESS OF BREATH  *UNUSUAL BRUISING OR BLEEDING  TENDERNESS IN MOUTH AND THROAT WITH OR WITHOUT PRESENCE OF ULCERS  *URINARY PROBLEMS  *BOWEL PROBLEMS  UNUSUAL RASH Items with * indicate a potential emergency and should be followed up as soon as possible.  Feel free to call the clinic should you have any questions or concerns. The clinic phone number is [phone].  Please show the [place] at check-in to the Emergency Department and triage nurse.

## 2019-12-20 ENCOUNTER — Inpatient Hospital Stay: Payer: Medicaid Other

## 2019-12-20 ENCOUNTER — Other Ambulatory Visit: Payer: Self-pay

## 2019-12-20 VITALS — BP 145/66 | HR 66 | Temp 98.7°F | Resp 16

## 2019-12-20 DIAGNOSIS — Z7189 Other specified counseling: Secondary | ICD-10-CM

## 2019-12-20 DIAGNOSIS — C801 Malignant (primary) neoplasm, unspecified: Secondary | ICD-10-CM

## 2019-12-20 DIAGNOSIS — Z5111 Encounter for antineoplastic chemotherapy: Secondary | ICD-10-CM | POA: Diagnosis not present

## 2019-12-20 DIAGNOSIS — C787 Secondary malignant neoplasm of liver and intrahepatic bile duct: Secondary | ICD-10-CM

## 2019-12-20 MED ORDER — DEXAMETHASONE SODIUM PHOSPHATE 10 MG/ML IJ SOLN
INTRAMUSCULAR | Status: AC
  Filled 2019-12-20: qty 1

## 2019-12-20 MED ORDER — DEXAMETHASONE SODIUM PHOSPHATE 10 MG/ML IJ SOLN
10.0000 mg | Freq: Once | INTRAMUSCULAR | Status: AC
  Administered 2019-12-20: 10 mg via INTRAVENOUS

## 2019-12-20 MED ORDER — SODIUM CHLORIDE 0.9% FLUSH
10.0000 mL | INTRAVENOUS | Status: DC | PRN
  Administered 2019-12-20: 10 mL
  Filled 2019-12-20: qty 10

## 2019-12-20 MED ORDER — SODIUM CHLORIDE 0.9 % IV SOLN
Freq: Once | INTRAVENOUS | Status: AC
  Filled 2019-12-20: qty 250

## 2019-12-20 MED ORDER — SODIUM CHLORIDE 0.9 % IV SOLN
75.0000 mg/m2 | Freq: Once | INTRAVENOUS | Status: AC
  Administered 2019-12-20: 140 mg via INTRAVENOUS
  Filled 2019-12-20: qty 7

## 2019-12-20 MED ORDER — HEPARIN SOD (PORK) LOCK FLUSH 100 UNIT/ML IV SOLN
500.0000 [IU] | Freq: Once | INTRAVENOUS | Status: AC | PRN
  Administered 2019-12-20: 500 [IU]
  Filled 2019-12-20: qty 5

## 2019-12-20 NOTE — Patient Instructions (Signed)
[place] Discharge Instructions for Patients Receiving Chemotherapy  Today you received the following chemotherapy agents Etoposide  To help prevent nausea and vomiting after your treatment, we encourage you to take your nausea medication as directed.  If you develop nausea and vomiting that is not controlled by your nausea medication, call the clinic.   BELOW ARE SYMPTOMS THAT SHOULD BE REPORTED IMMEDIATELY:  *FEVER GREATER THAN 100.5 F  *CHILLS WITH OR WITHOUT FEVER  NAUSEA AND VOMITING THAT IS NOT CONTROLLED WITH YOUR NAUSEA MEDICATION  *UNUSUAL SHORTNESS OF BREATH  *UNUSUAL BRUISING OR BLEEDING  TENDERNESS IN MOUTH AND THROAT WITH OR WITHOUT PRESENCE OF ULCERS  *URINARY PROBLEMS  *BOWEL PROBLEMS  UNUSUAL RASH Items with * indicate a potential emergency and should be followed up as soon as possible.  Feel free to call the clinic should you have any questions or concerns. The clinic phone number is [phone].  Please show the [place] at check-in to the Emergency Department and triage nurse.

## 2019-12-21 ENCOUNTER — Telehealth: Payer: Self-pay | Admitting: Hematology

## 2019-12-21 ENCOUNTER — Other Ambulatory Visit: Payer: Self-pay

## 2019-12-21 ENCOUNTER — Inpatient Hospital Stay: Payer: Medicaid Other

## 2019-12-21 VITALS — BP 139/62 | HR 69 | Temp 98.5°F | Resp 16

## 2019-12-21 DIAGNOSIS — C787 Secondary malignant neoplasm of liver and intrahepatic bile duct: Secondary | ICD-10-CM

## 2019-12-21 DIAGNOSIS — Z7189 Other specified counseling: Secondary | ICD-10-CM

## 2019-12-21 DIAGNOSIS — Z5111 Encounter for antineoplastic chemotherapy: Secondary | ICD-10-CM | POA: Diagnosis not present

## 2019-12-21 MED ORDER — SODIUM CHLORIDE 0.9% FLUSH
10.0000 mL | INTRAVENOUS | Status: DC | PRN
  Administered 2019-12-21: 10 mL
  Filled 2019-12-21: qty 10

## 2019-12-21 MED ORDER — HEPARIN SOD (PORK) LOCK FLUSH 100 UNIT/ML IV SOLN
500.0000 [IU] | Freq: Once | INTRAVENOUS | Status: AC | PRN
  Administered 2019-12-21: 500 [IU]
  Filled 2019-12-21: qty 5

## 2019-12-21 MED ORDER — SODIUM CHLORIDE 0.9 % IV SOLN
Freq: Once | INTRAVENOUS | Status: AC
  Filled 2019-12-21: qty 250

## 2019-12-21 MED ORDER — DEXAMETHASONE SODIUM PHOSPHATE 10 MG/ML IJ SOLN
10.0000 mg | Freq: Once | INTRAMUSCULAR | Status: AC
  Administered 2019-12-21: 10 mg via INTRAVENOUS

## 2019-12-21 MED ORDER — SODIUM CHLORIDE 0.9 % IV SOLN
75.0000 mg/m2 | Freq: Once | INTRAVENOUS | Status: AC
  Administered 2019-12-21: 140 mg via INTRAVENOUS
  Filled 2019-12-21: qty 7

## 2019-12-21 MED ORDER — DEXAMETHASONE SODIUM PHOSPHATE 10 MG/ML IJ SOLN
INTRAMUSCULAR | Status: AC
  Filled 2019-12-21: qty 1

## 2019-12-21 NOTE — Patient Instructions (Signed)
[place] Discharge Instructions for Patients Receiving Chemotherapy  Today you received the following chemotherapy agents Etoposide  To help prevent nausea and vomiting after your treatment, we encourage you to take your nausea medication as directed.  If you develop nausea and vomiting that is not controlled by your nausea medication, call the clinic.   BELOW ARE SYMPTOMS THAT SHOULD BE REPORTED IMMEDIATELY:  *FEVER GREATER THAN 100.5 F  *CHILLS WITH OR WITHOUT FEVER  NAUSEA AND VOMITING THAT IS NOT CONTROLLED WITH YOUR NAUSEA MEDICATION  *UNUSUAL SHORTNESS OF BREATH  *UNUSUAL BRUISING OR BLEEDING  TENDERNESS IN MOUTH AND THROAT WITH OR WITHOUT PRESENCE OF ULCERS  *URINARY PROBLEMS  *BOWEL PROBLEMS  UNUSUAL RASH Items with * indicate a potential emergency and should be followed up as soon as possible.  Feel free to call the clinic should you have any questions or concerns. The clinic phone number is [phone].  Please show the [place] at check-in to the Emergency Department and triage nurse.

## 2019-12-21 NOTE — Telephone Encounter (Signed)
Scheduled per 02/15 los, called patient and no voicemail was set up. Calender will be mailed.

## 2019-12-23 ENCOUNTER — Inpatient Hospital Stay: Payer: Medicaid Other

## 2019-12-23 ENCOUNTER — Other Ambulatory Visit: Payer: Self-pay

## 2019-12-23 VITALS — BP 139/74 | HR 69 | Temp 98.3°F | Resp 20

## 2019-12-23 DIAGNOSIS — Z5111 Encounter for antineoplastic chemotherapy: Secondary | ICD-10-CM | POA: Diagnosis not present

## 2019-12-23 DIAGNOSIS — C787 Secondary malignant neoplasm of liver and intrahepatic bile duct: Secondary | ICD-10-CM

## 2019-12-23 DIAGNOSIS — C801 Malignant (primary) neoplasm, unspecified: Secondary | ICD-10-CM

## 2019-12-23 DIAGNOSIS — Z7189 Other specified counseling: Secondary | ICD-10-CM

## 2019-12-23 MED ORDER — PEGFILGRASTIM-JMDB 6 MG/0.6ML ~~LOC~~ SOSY
6.0000 mg | PREFILLED_SYRINGE | Freq: Once | SUBCUTANEOUS | Status: AC
  Administered 2019-12-23: 13:00:00 6 mg via SUBCUTANEOUS

## 2019-12-23 MED ORDER — PEGFILGRASTIM-JMDB 6 MG/0.6ML ~~LOC~~ SOSY
PREFILLED_SYRINGE | SUBCUTANEOUS | Status: AC
  Filled 2019-12-23: qty 0.6

## 2019-12-23 NOTE — Patient Instructions (Signed)

## 2019-12-29 ENCOUNTER — Other Ambulatory Visit: Payer: Self-pay | Admitting: Hematology

## 2019-12-29 DIAGNOSIS — C801 Malignant (primary) neoplasm, unspecified: Secondary | ICD-10-CM

## 2019-12-29 DIAGNOSIS — C787 Secondary malignant neoplasm of liver and intrahepatic bile duct: Secondary | ICD-10-CM

## 2019-12-29 DIAGNOSIS — Z7189 Other specified counseling: Secondary | ICD-10-CM

## 2019-12-30 ENCOUNTER — Other Ambulatory Visit: Payer: Self-pay | Admitting: Hematology

## 2019-12-30 ENCOUNTER — Encounter: Payer: Self-pay | Admitting: Hematology

## 2019-12-30 DIAGNOSIS — Z7189 Other specified counseling: Secondary | ICD-10-CM

## 2019-12-30 DIAGNOSIS — C787 Secondary malignant neoplasm of liver and intrahepatic bile duct: Secondary | ICD-10-CM

## 2019-12-30 NOTE — Telephone Encounter (Signed)
Dr. Irene Limbo, Sending to you for approval or refusal.  Thanks! Threasa Beards

## 2019-12-30 NOTE — Telephone Encounter (Signed)
Dr. Irene Limbo, This is a duplicate request.  You will need to refuse one of them.  Nursing cannot approve or refuse scheduled drugs.  Thanks! Threasa Beards

## 2020-01-02 ENCOUNTER — Other Ambulatory Visit: Payer: Self-pay | Admitting: Hematology

## 2020-01-02 DIAGNOSIS — Z7189 Other specified counseling: Secondary | ICD-10-CM

## 2020-01-02 DIAGNOSIS — C787 Secondary malignant neoplasm of liver and intrahepatic bile duct: Secondary | ICD-10-CM

## 2020-01-02 MED ORDER — LORAZEPAM 0.5 MG PO TABS: 0.5000 mg | ORAL_TABLET | Freq: Four times a day (QID) | ORAL | 0 refills | Status: DC | PRN

## 2020-01-02 MED ORDER — OXYCODONE HCL 5 MG PO TABS: 5.0000 mg | ORAL_TABLET | ORAL | 0 refills | Status: DC | PRN

## 2020-01-02 NOTE — Telephone Encounter (Signed)
Last filled on 12/14/19

## 2020-01-10 ENCOUNTER — Emergency Department (HOSPITAL_COMMUNITY)
Admission: EM | Admit: 2020-01-10 | Discharge: 2020-01-10 | Disposition: A | Discharge status: Home / Self Care | Payer: Medicaid Other | Attending: Emergency Medicine | Admitting: Emergency Medicine

## 2020-01-10 ENCOUNTER — Encounter: Payer: Self-pay | Admitting: Hematology

## 2020-01-10 ENCOUNTER — Encounter (HOSPITAL_COMMUNITY): Payer: Self-pay | Admitting: Emergency Medicine

## 2020-01-10 ENCOUNTER — Other Ambulatory Visit: Payer: Self-pay

## 2020-01-10 DIAGNOSIS — Z8505 Personal history of malignant neoplasm of liver: Secondary | ICD-10-CM | POA: Insufficient documentation

## 2020-01-10 DIAGNOSIS — M109 Gout, unspecified: Secondary | ICD-10-CM | POA: Diagnosis not present

## 2020-01-10 DIAGNOSIS — Z85830 Personal history of malignant neoplasm of bone: Secondary | ICD-10-CM | POA: Diagnosis not present

## 2020-01-10 DIAGNOSIS — I10 Essential (primary) hypertension: Secondary | ICD-10-CM | POA: Diagnosis not present

## 2020-01-10 DIAGNOSIS — Z79899 Other long term (current) drug therapy: Secondary | ICD-10-CM | POA: Insufficient documentation

## 2020-01-10 DIAGNOSIS — M79645 Pain in left finger(s): Secondary | ICD-10-CM | POA: Diagnosis present

## 2020-01-10 MED ORDER — PREDNISONE 10 MG (21) PO TBPK: ORAL_TABLET | Freq: Every day | ORAL | 0 refills | Status: DC

## 2020-01-10 MED ORDER — IBUPROFEN 800 MG PO TABS
800.0000 mg | ORAL_TABLET | Freq: Once | ORAL | Status: AC
  Administered 2020-01-10: 800 mg via ORAL
  Filled 2020-01-10: qty 1

## 2020-01-10 MED ORDER — IBUPROFEN 800 MG PO TABS: 800.0000 mg | ORAL_TABLET | Freq: Three times a day (TID) | ORAL | 0 refills | Status: DC

## 2020-01-10 MED ORDER — PREDNISONE 20 MG PO TABS
40.0000 mg | ORAL_TABLET | Freq: Once | ORAL | Status: AC
  Administered 2020-01-10: 40 mg via ORAL
  Filled 2020-01-10: qty 2

## 2020-01-10 NOTE — ED Provider Notes (Signed)
[place] DEPT Provider Note   CSN: [id number] Arrival date & time: 01/10/20  7035     History Chief Complaint  Patient presents with   Gout    Brit [name] is a 59 y.o. male.  HPI Patient is a 59 year old male with a long history of gouty arthritis, also with history of hypertension on but states that he is not currently on medications for HTN, specifically not on any diuretics.  Patient has a history of alcohol use however states that he is less drinking 6 years ago.  He has been on allopurinol in the past but has not been taking it for some time as he has not had an episode of gout.  He states that he has had gout in his left middle digit multiple times in the past.  He states that for the past 2 days he has had severely painful sharp achy pain in his left middle finger MCP.  He states is worse with touch and movement.  He states it is warm to touch but denies any fevers, chills, fatigue, malaise.  Patient states that this feels exactly like his gout flares have in the past.  He states he is able to move the digit somewhat but that is very painful to do so.  He denies any aggravating or mitigating factors.   Patient states that over the last week he has had more red meat/steak than he usually does.  Denies any alcohol use.     Past Medical History:  Diagnosis Date   Arthritis    "knees; left hand"   Cancer ([place])    Gout    Heart murmur    Longstanding. 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 Died at age 40   Prostate cancer Father    Breast cancer Cousin    Diabetes Paternal Uncle    Heart disease Paternal Uncle     Social History   Tobacco Use   Smoking status: Never Smoker   Smokeless tobacco: Never Used  Substance Use Topics   Alcohol use: Not Currently    Comment: states he has cut back/ every now and again   Drug use: No    Home Medications Prior to Admission medications   Medication Sig Start Date End Date Taking? Authorizing Provider  allopurinol (ZYLOPRIM) 100 MG tablet Take 2 tablets (200 mg total) by mouth daily. Patient not taking: Reported on 11/28/2019 01/26/19   Brunetta Genera, MD  colchicine 0.6 MG tablet Take 1 tablet (0.6 mg total) by mouth See  admin instructions. Take 1.2mg  (2 tablets) followed by .6mg  (1 tablet) 1 hour later. Patient not taking: Reported on 11/28/2019 03/27/18   Molpus, Jenny Reichmann, MD  dexamethasone (DECADRON) 4 MG tablet 4mg  po daily with breakfast for 2 weeks then every other day for 2 weeks Patient not taking: Reported on 12/19/2019 12/24/18   Brunetta Genera, MD  dexamethasone (DECADRON) 4 MG tablet Take 2 tablets (8 mg total) by mouth daily. Start the day after chemotherapy for 1 day. 10/06/19   Brunetta Genera, MD  furosemide (LASIX) 20 MG tablet Take 1 tablet (20 mg total) by mouth daily. Patient not taking: Reported on 11/28/2019 11/24/18   Brunetta Genera, MD  ibuprofen (ADVIL) 800 MG tablet Take 1 tablet (800 mg total) by mouth 3 (three) times daily for 5 days. 01/10/20 01/15/20  Tedd Sias, PA  lidocaine-prilocaine  (EMLA) cream Apply to affected area once 10/06/19   Brunetta Genera, MD  LORazepam (ATIVAN) 0.5 MG tablet Take 1 tablet (0.5 mg total) by mouth every 6 (six) hours as needed (Nausea or vomiting). 01/02/20   Brunetta Genera, MD  ondansetron (ZOFRAN ODT) 4 MG disintegrating tablet Take 1 tablet (4 mg total) by mouth every 8 (eight) hours as needed for nausea or vomiting. Patient not taking: Reported on 11/28/2019 10/01/18   Francine Graven, DO  ondansetron (ZOFRAN) 8 MG tablet Take 1 tablet (8 mg total) by mouth 2 (two) times daily as needed for refractory nausea / vomiting. Start on day 3 after carboplatin chemo. 10/06/19   Brunetta Genera, MD  oxyCODONE (OXY IR/ROXICODONE) 5 MG immediate release tablet Take 1-2 tablets (5-10 mg total) by mouth every 4 (four) hours as needed for severe pain. 01/02/20   Brunetta Genera, MD  potassium chloride SA (K-DUR,KLOR-CON) 20 MEQ tablet Take 1 tablet (20 mEq total) by mouth daily. With lasix Patient not taking: Reported on 11/28/2019 11/24/18   Brunetta Genera, MD  predniSONE (STERAPRED UNI-PAK 21 TAB) 10 MG (21) TBPK tablet Take by mouth daily. Take 6 tabs by mouth daily  for 2 days, then 5 tabs for 2 days, then 4 tabs for 2 days, then 3 tabs for 2 days, 2 tabs for 2 days, then 1 tab by mouth daily for 2 days 01/10/20   Tedd Sias, PA  prochlorperazine (COMPAZINE) 10 MG tablet Take 1 tablet (10 mg total) by mouth every 6 (six) hours as needed (Nausea or vomiting). Patient not taking: Reported on 11/28/2019 10/20/18   Brunetta Genera, MD  senna-docusate (SENNA S) 8.6-50 MG tablet Take 2 tablets by mouth at bedtime. 08/29/19   Brunetta Genera, MD    Allergies    Patient has no known allergies.  Review of Systems   Review of Systems  Constitutional: Negative for chills and fever.  HENT: Negative for congestion.   Eyes: Negative for pain.  Respiratory: Negative for cough and shortness of breath.   Cardiovascular: Negative for  chest pain and leg swelling.  Gastrointestinal: Negative for abdominal pain and vomiting.  Genitourinary: Negative for dysuria.  Musculoskeletal: Negative for myalgias.       Left middle finger MCP pain  Skin: Negative for rash.  Neurological: Negative for dizziness and headaches.    Physical Exam Updated Vital Signs BP (!) 161/74    Pulse 78    Temp 98.4 F (36.9 C) (Oral)    Resp 17    Ht 5\' 7"  (1.702 m)    Wt 72.6 kg  SpO2 100%    BMI 25.06 kg/m   Physical Exam Vitals and nursing note reviewed.  Constitutional:      General: He is not in acute distress.    Appearance: Normal appearance. He is not ill-appearing.     Comments: 59 year old male in no acute distress  HENT:     Head: Normocephalic and atraumatic.  Eyes:     General: No scleral icterus.       Right eye: No discharge.        Left eye: No discharge.     Conjunctiva/sclera: Conjunctivae normal.  Pulmonary:     Effort: Pulmonary effort is normal.     Breath sounds: No stridor.  Musculoskeletal:     Comments: Left middle digit MCP with mild swelling, warmth, tenderness to touch.  Patient is able to slightly flex and extend finger. Passive ROM: able to flex and extend finger 50 degrees with severe pain.  No bony deformity.  No crepitus.  Neurological:     Mental Status: He is alert and oriented to person, place, and time. Mental status is at baseline.  Psychiatric:        Mood and Affect: Mood normal.        Behavior: Behavior normal.     ED Results / Procedures / Treatments   Labs (all labs ordered are listed, but only abnormal results are displayed) Labs Reviewed - No data to display  EKG None  Radiology No results found.  Procedures Procedures (including critical care time)  Medications Ordered in ED Medications  ibuprofen (ADVIL) tablet 800 mg (800 mg Oral Given 01/10/20 0707)  predniSONE (DELTASONE) tablet 40 mg (40 mg Oral Given 01/10/20 6222)    ED Course  I have reviewed the triage vital  signs and the nursing notes.  Pertinent labs & imaging results that were available during my care of the patient were reviewed by me and considered in my medical decision making (see chart for details).    MDM Rules/Calculators/A&P                      Patient is a 59 year old male with a history of gouty arthritis.  States that he has had episodes of arthritis in the left middle digit MCP in the past and feels similar was going on today.  He states that he is experienced 2 days of pain in his left middle MCP.  He denies any systemic symptoms.  Has vital signs within normal months apart from mild hypertension which is likely secondary to pain.  He has no fevers, chills, fatigue, malaise.  Considered septic arthritis however this is unlikely as I am able to passively range patient's finger through at least 45 degrees range of motion.  There is redness tenderness to palpation however he states that he is adequately arthritis flares in this area in the past.  No indication for joint aspiration at this time.  On my review of EMR patient has no evidence of renal insufficiency last blood work was 12/19/19.  Patient is already on opioid/narcotic pain medications for pain related to his liver cancer.  I will recommend the patient take NSAIDs and place patient on steroid taper.  We will discharge patient with ibuprofen 800 MG p.o. 3 times daily for 5 days with prednisone taper.  Patient will follow up with his primary care doctor.   Final Clinical Impression(s) / ED Diagnoses Final diagnoses:  Acute gout of right hand, unspecified cause  Rx / DC Orders ED Discharge Orders         Ordered    predniSONE (STERAPRED UNI-PAK 21 TAB) 10 MG (21) TBPK tablet  Daily     01/10/20 0659    ibuprofen (ADVIL) 800 MG tablet  3 times daily     01/10/20 0659           Tedd Sias, PA 01/10/20 0710    Fatima Blank, MD 01/10/20 [phone]

## 2020-01-10 NOTE — ED Triage Notes (Signed)
Patient is complaining of gout in right hand that started a couple days ago.

## 2020-01-10 NOTE — Discharge Instructions (Signed)
Please use the low purine eating plan that included in discharge paperwork.  Please cut back on red meat and any alcohol.  Please drink plenty of water.  Please take ibuprofen 800 mg 3 times daily for the next 5 days.  Please take the prednisone taper as prescribed.  There are directions included.  Please follow-up with your primary care doctor.  If you do not have a primary care doctor please follow-up with the [place] wellness clinic.

## 2020-01-13 ENCOUNTER — Inpatient Hospital Stay: Payer: Medicaid Other | Attending: Hematology

## 2020-01-13 ENCOUNTER — Other Ambulatory Visit: Payer: Self-pay

## 2020-01-13 VITALS — BP 158/71 | HR 72 | Temp 98.2°F | Resp 18

## 2020-01-13 DIAGNOSIS — C7951 Secondary malignant neoplasm of bone: Secondary | ICD-10-CM

## 2020-01-13 DIAGNOSIS — Z95828 Presence of other vascular implants and grafts: Secondary | ICD-10-CM

## 2020-01-13 DIAGNOSIS — C7A1 Malignant poorly differentiated neuroendocrine tumors: Secondary | ICD-10-CM | POA: Insufficient documentation

## 2020-01-13 DIAGNOSIS — Z7189 Other specified counseling: Secondary | ICD-10-CM

## 2020-01-13 DIAGNOSIS — Z79899 Other long term (current) drug therapy: Secondary | ICD-10-CM | POA: Diagnosis not present

## 2020-01-13 DIAGNOSIS — C787 Secondary malignant neoplasm of liver and intrahepatic bile duct: Secondary | ICD-10-CM

## 2020-01-13 MED ORDER — DENOSUMAB 120 MG/1.7ML ~~LOC~~ SOLN
SUBCUTANEOUS | Status: AC
  Filled 2020-01-13: qty 1.7

## 2020-01-13 MED ORDER — DENOSUMAB 120 MG/1.7ML ~~LOC~~ SOLN
120.0000 mg | Freq: Once | SUBCUTANEOUS | Status: AC
  Administered 2020-01-13: 120 mg via SUBCUTANEOUS

## 2020-01-13 NOTE — Patient Instructions (Signed)
Denosumab injection What is this medicine? DENOSUMAB (den oh sue mab) slows bone breakdown. Prolia is used to treat osteoporosis in women after menopause and in men, and in people who are taking corticosteroids for 6 months or more. Xgeva is used to treat a high calcium level due to cancer and to prevent bone fractures and other bone problems caused by multiple myeloma or cancer bone metastases. Xgeva is also used to treat giant cell tumor of the bone. This medicine may be used for other purposes; ask your health care provider or pharmacist if you have questions. COMMON BRAND NAME(S): Prolia, XGEVA What should I tell my health care provider before I take this medicine? They need to know if you have any of these conditions:  dental disease  having surgery or tooth extraction  infection  kidney disease  low levels of calcium or Vitamin D in the blood  malnutrition  on hemodialysis  skin conditions or sensitivity  thyroid or parathyroid disease  an unusual reaction to denosumab, other medicines, foods, dyes, or preservatives  pregnant or trying to get pregnant  breast-feeding How should I use this medicine? This medicine is for injection under the skin. It is given by a health care professional in a hospital or clinic setting. A special MedGuide will be given to you before each treatment. Be sure to read this information carefully each time. For Prolia, talk to your pediatrician regarding the use of this medicine in children. Special care may be needed. For Xgeva, talk to your pediatrician regarding the use of this medicine in children. While this drug may be prescribed for children as young as 13 years for selected conditions, precautions do apply. Overdosage: If you think you have taken too much of this medicine contact a poison control center or emergency room at once. NOTE: This medicine is only for you. Do not share this medicine with others. What if I miss a dose? It is  important not to miss your dose. Call your doctor or health care professional if you are unable to keep an appointment. What may interact with this medicine? Do not take this medicine with any of the following medications:  other medicines containing denosumab This medicine may also interact with the following medications:  medicines that lower your chance of fighting infection  steroid medicines like prednisone or cortisone This list may not describe all possible interactions. Give your health care provider a list of all the medicines, herbs, non-prescription drugs, or dietary supplements you use. Also tell them if you smoke, drink alcohol, or use illegal drugs. Some items may interact with your medicine. What should I watch for while using this medicine? Visit your doctor or health care professional for regular checks on your progress. Your doctor or health care professional may order blood tests and other tests to see how you are doing. Call your doctor or health care professional for advice if you get a fever, chills or sore throat, or other symptoms of a cold or flu. Do not treat yourself. This drug may decrease your body's ability to fight infection. Try to avoid being around people who are sick. You should make sure you get enough calcium and vitamin D while you are taking this medicine, unless your doctor tells you not to. Discuss the foods you eat and the vitamins you take with your health care professional. See your dentist regularly. Brush and floss your teeth as directed. Before you have any dental work done, tell your dentist you are   receiving this medicine. Do not become pregnant while taking this medicine or for 5 months after stopping it. Talk with your doctor or health care professional about your birth control options while taking this medicine. Women should inform their doctor if they wish to become pregnant or think they might be pregnant. There is a potential for serious side  effects to an unborn child. Talk to your health care professional or pharmacist for more information. What side effects may I notice from receiving this medicine? Side effects that you should report to your doctor or health care professional as soon as possible:  allergic reactions like skin rash, itching or hives, swelling of the face, lips, or tongue  bone pain  breathing problems  dizziness  jaw pain, especially after dental work  redness, blistering, peeling of the skin  signs and symptoms of infection like fever or chills; cough; sore throat; pain or trouble passing urine  signs of low calcium like fast heartbeat, muscle cramps or muscle pain; pain, tingling, numbness in the hands or feet; seizures  unusual bleeding or bruising  unusually weak or tired Side effects that usually do not require medical attention (report to your doctor or health care professional if they continue or are bothersome):  constipation  diarrhea  headache  joint pain  loss of appetite  muscle pain  runny nose  tiredness  upset stomach This list may not describe all possible side effects. Call your doctor for medical advice about side effects. You may report side effects to FDA at 1-800-FDA-1088. Where should I keep my medicine? This medicine is only given in a clinic, doctor's office, or other health care setting and will not be stored at home. NOTE: This sheet is a summary. It may not cover all possible information. If you have questions about this medicine, talk to your doctor, pharmacist, or health care provider.  2020 Elsevier/Gold Standard (2018-02-26 16:10:44)

## 2020-01-13 NOTE — Progress Notes (Signed)
Doctor Irene Limbo said it was ok to use labs from 12/19/19 and is aware calcium was at 8.8. ok to receive xgeva without labs today

## 2020-01-14 ENCOUNTER — Other Ambulatory Visit: Payer: Self-pay | Admitting: Hematology

## 2020-01-14 DIAGNOSIS — Z7189 Other specified counseling: Secondary | ICD-10-CM

## 2020-01-14 DIAGNOSIS — C787 Secondary malignant neoplasm of liver and intrahepatic bile duct: Secondary | ICD-10-CM

## 2020-01-15 ENCOUNTER — Emergency Department (HOSPITAL_COMMUNITY): Payer: Medicaid Other

## 2020-01-15 ENCOUNTER — Inpatient Hospital Stay (HOSPITAL_COMMUNITY)
Admission: EM | Admit: 2020-01-15 | Discharge: 2020-01-19 | DRG: 871 | Disposition: A | Discharge status: Home / Self Care | Payer: Medicaid Other | Attending: Internal Medicine | Admitting: Internal Medicine

## 2020-01-15 ENCOUNTER — Other Ambulatory Visit: Payer: Self-pay

## 2020-01-15 ENCOUNTER — Encounter (HOSPITAL_COMMUNITY): Payer: Self-pay | Admitting: *Deleted

## 2020-01-15 DIAGNOSIS — Z8249 Family history of ischemic heart disease and other diseases of the circulatory system: Secondary | ICD-10-CM

## 2020-01-15 DIAGNOSIS — M545 Low back pain: Secondary | ICD-10-CM | POA: Diagnosis present

## 2020-01-15 DIAGNOSIS — A419 Sepsis, unspecified organism: Secondary | ICD-10-CM | POA: Diagnosis not present

## 2020-01-15 DIAGNOSIS — J189 Pneumonia, unspecified organism: Secondary | ICD-10-CM

## 2020-01-15 DIAGNOSIS — R011 Cardiac murmur, unspecified: Secondary | ICD-10-CM | POA: Diagnosis present

## 2020-01-15 DIAGNOSIS — M1990 Unspecified osteoarthritis, unspecified site: Secondary | ICD-10-CM | POA: Diagnosis not present

## 2020-01-15 DIAGNOSIS — I10 Essential (primary) hypertension: Secondary | ICD-10-CM | POA: Diagnosis present

## 2020-01-15 DIAGNOSIS — D500 Iron deficiency anemia secondary to blood loss (chronic): Secondary | ICD-10-CM | POA: Diagnosis not present

## 2020-01-15 DIAGNOSIS — D630 Anemia in neoplastic disease: Secondary | ICD-10-CM | POA: Diagnosis present

## 2020-01-15 DIAGNOSIS — U071 COVID-19: Secondary | ICD-10-CM | POA: Diagnosis present

## 2020-01-15 DIAGNOSIS — J159 Unspecified bacterial pneumonia: Secondary | ICD-10-CM | POA: Diagnosis present

## 2020-01-15 DIAGNOSIS — Z79891 Long term (current) use of opiate analgesic: Secondary | ICD-10-CM | POA: Diagnosis not present

## 2020-01-15 DIAGNOSIS — D649 Anemia, unspecified: Secondary | ICD-10-CM | POA: Diagnosis not present

## 2020-01-15 DIAGNOSIS — R0789 Other chest pain: Secondary | ICD-10-CM | POA: Diagnosis not present

## 2020-01-15 DIAGNOSIS — Z7952 Long term (current) use of systemic steroids: Secondary | ICD-10-CM | POA: Diagnosis not present

## 2020-01-15 DIAGNOSIS — Z95828 Presence of other vascular implants and grafts: Secondary | ICD-10-CM

## 2020-01-15 DIAGNOSIS — Z803 Family history of malignant neoplasm of breast: Secondary | ICD-10-CM | POA: Diagnosis not present

## 2020-01-15 DIAGNOSIS — R7989 Other specified abnormal findings of blood chemistry: Secondary | ICD-10-CM | POA: Diagnosis not present

## 2020-01-15 DIAGNOSIS — K766 Portal hypertension: Secondary | ICD-10-CM | POA: Diagnosis present

## 2020-01-15 DIAGNOSIS — D72829 Elevated white blood cell count, unspecified: Secondary | ICD-10-CM

## 2020-01-15 DIAGNOSIS — R195 Other fecal abnormalities: Secondary | ICD-10-CM | POA: Diagnosis present

## 2020-01-15 DIAGNOSIS — E871 Hypo-osmolality and hyponatremia: Secondary | ICD-10-CM | POA: Diagnosis not present

## 2020-01-15 DIAGNOSIS — R109 Unspecified abdominal pain: Secondary | ICD-10-CM | POA: Diagnosis present

## 2020-01-15 DIAGNOSIS — Z79899 Other long term (current) drug therapy: Secondary | ICD-10-CM

## 2020-01-15 DIAGNOSIS — K7460 Unspecified cirrhosis of liver: Secondary | ICD-10-CM | POA: Diagnosis present

## 2020-01-15 DIAGNOSIS — Z8042 Family history of malignant neoplasm of prostate: Secondary | ICD-10-CM

## 2020-01-15 DIAGNOSIS — A4189 Other specified sepsis: Principal | ICD-10-CM | POA: Diagnosis present

## 2020-01-15 DIAGNOSIS — D509 Iron deficiency anemia, unspecified: Secondary | ICD-10-CM | POA: Diagnosis present

## 2020-01-15 DIAGNOSIS — Z9221 Personal history of antineoplastic chemotherapy: Secondary | ICD-10-CM

## 2020-01-15 DIAGNOSIS — C7951 Secondary malignant neoplasm of bone: Secondary | ICD-10-CM | POA: Diagnosis present

## 2020-01-15 DIAGNOSIS — C7A1 Malignant poorly differentiated neuroendocrine tumors: Secondary | ICD-10-CM | POA: Diagnosis present

## 2020-01-15 DIAGNOSIS — R509 Fever, unspecified: Secondary | ICD-10-CM

## 2020-01-15 DIAGNOSIS — D638 Anemia in other chronic diseases classified elsewhere: Secondary | ICD-10-CM | POA: Diagnosis not present

## 2020-01-15 DIAGNOSIS — M109 Gout, unspecified: Secondary | ICD-10-CM | POA: Diagnosis present

## 2020-01-15 DIAGNOSIS — C801 Malignant (primary) neoplasm, unspecified: Secondary | ICD-10-CM | POA: Diagnosis present

## 2020-01-15 DIAGNOSIS — C787 Secondary malignant neoplasm of liver and intrahepatic bile duct: Secondary | ICD-10-CM | POA: Diagnosis present

## 2020-01-15 LAB — URINALYSIS, ROUTINE W REFLEX MICROSCOPIC
Bacteria, UA: NONE SEEN
Bilirubin Urine: NEGATIVE
Glucose, UA: NEGATIVE mg/dL
Hgb urine dipstick: NEGATIVE
Ketones, ur: 5 mg/dL — AB
Leukocytes,Ua: NEGATIVE
Nitrite: NEGATIVE
Protein, ur: NEGATIVE mg/dL
Specific Gravity, Urine: 1.019 (ref 1.005–1.030)
pH: 6 (ref 5.0–8.0)

## 2020-01-15 LAB — COMPREHENSIVE METABOLIC PANEL
ALT: 22 U/L (ref 0–44)
AST: 21 U/L (ref 15–41)
Albumin: 3.2 g/dL — ABNORMAL LOW (ref 3.5–5.0)
Alkaline Phosphatase: 118 U/L (ref 38–126)
Anion gap: 10 (ref 5–15)
BUN: 16 mg/dL (ref 6–20)
CO2: 23 mmol/L (ref 22–32)
Calcium: 8.3 mg/dL — ABNORMAL LOW (ref 8.9–10.3)
Chloride: 99 mmol/L (ref 98–111)
Creatinine, Ser: 0.94 mg/dL (ref 0.61–1.24)
GFR calc Af Amer: >60 mL/min (ref >60)
GFR calc non Af Amer: >60 mL/min (ref >60)
Glucose, Bld: 145 mg/dL — ABNORMAL HIGH (ref 70–99)
Potassium: 3.9 mmol/L (ref 3.5–5.1)
Sodium: 132 mmol/L — ABNORMAL LOW (ref 135–145)
Total Bilirubin: 1.3 mg/dL — ABNORMAL HIGH (ref 0.3–1.2)
Total Protein: 7.6 g/dL (ref 6.5–8.1)

## 2020-01-15 LAB — CBC WITH DIFFERENTIAL/PLATELET
Abs Immature Granulocytes: 0.43 10*3/uL — ABNORMAL HIGH (ref 0.00–0.07)
Basophils Absolute: 0 10*3/uL (ref 0.0–0.1)
Basophils Relative: 0 %
Eosinophils Absolute: 0 10*3/uL (ref 0.0–0.5)
Eosinophils Relative: 0 %
HCT: 25 % — ABNORMAL LOW (ref 39.0–52.0)
Hemoglobin: 7.5 g/dL — ABNORMAL LOW (ref 13.0–17.0)
Immature Granulocytes: 3 %
Lymphocytes Relative: 7 %
Lymphs Abs: 1.1 10*3/uL (ref 0.7–4.0)
MCH: 29.2 pg (ref 26.0–34.0)
MCHC: 30 g/dL (ref 30.0–36.0)
MCV: 97.3 fL (ref 80.0–100.0)
Monocytes Absolute: 2.4 10*3/uL — ABNORMAL HIGH (ref 0.1–1.0)
Monocytes Relative: 14 %
Neutro Abs: 12.7 10*3/uL — ABNORMAL HIGH (ref 1.7–7.7)
Neutrophils Relative %: 76 %
Platelets: 113 10*3/uL — ABNORMAL LOW (ref 150–400)
RBC: 2.57 MIL/uL — ABNORMAL LOW (ref 4.22–5.81)
RDW: 18.7 % — ABNORMAL HIGH (ref 11.5–15.5)
WBC: 16.6 10*3/uL — ABNORMAL HIGH (ref 4.0–10.5)
nRBC: 0 % (ref 0.0–0.2)

## 2020-01-15 LAB — POC SARS CORONAVIRUS 2 AG -  ED: SARS Coronavirus 2 Ag: NEGATIVE

## 2020-01-15 LAB — PROTIME-INR
INR: 1.2 (ref 0.8–1.2)
Prothrombin Time: 15.2 seconds (ref 11.4–15.2)

## 2020-01-15 LAB — APTT: aPTT: 40 seconds — ABNORMAL HIGH (ref 24–36)

## 2020-01-15 LAB — LACTIC ACID, PLASMA: Lactic Acid, Venous: 1.3 mmol/L (ref 0.5–1.9)

## 2020-01-15 MED ORDER — IOHEXOL 300 MG/ML  SOLN
100.0000 mL | Freq: Once | INTRAMUSCULAR | Status: AC | PRN
  Administered 2020-01-15: 100 mL via INTRAVENOUS

## 2020-01-15 MED ORDER — AZITHROMYCIN 250 MG PO TABS
500.0000 mg | ORAL_TABLET | Freq: Once | ORAL | Status: AC
  Administered 2020-01-15: 500 mg via ORAL
  Filled 2020-01-15: qty 2

## 2020-01-15 MED ORDER — SODIUM CHLORIDE 0.9 % IV BOLUS
1000.0000 mL | Freq: Once | INTRAVENOUS | Status: AC
  Administered 2020-01-15: 1000 mL via INTRAVENOUS

## 2020-01-15 MED ORDER — MORPHINE SULFATE (PF) 4 MG/ML IV SOLN
4.0000 mg | Freq: Once | INTRAVENOUS | Status: AC
  Administered 2020-01-15: 4 mg via INTRAVENOUS
  Filled 2020-01-15: qty 1

## 2020-01-15 MED ORDER — SODIUM CHLORIDE 0.9 % IV SOLN
1.0000 g | Freq: Once | INTRAVENOUS | Status: AC
  Administered 2020-01-15: 1 g via INTRAVENOUS
  Filled 2020-01-15: qty 10

## 2020-01-15 MED ORDER — SODIUM CHLORIDE (PF) 0.9 % IJ SOLN
INTRAMUSCULAR | Status: AC
  Filled 2020-01-15: qty 50

## 2020-01-15 MED ORDER — ACETAMINOPHEN 325 MG PO TABS
650.0000 mg | ORAL_TABLET | Freq: Once | ORAL | Status: AC
  Administered 2020-01-15: 650 mg via ORAL
  Filled 2020-01-15: qty 2

## 2020-01-15 NOTE — H&P (Signed)
History and Physical    [name] FTD:[id number] DOB: September 10, 1961 DOA: 01/15/2020  PCP: Dr. Irene Limbo Patient coming from: Home  Chief Complaint: Fever  HPI: [name] is a 59 y.o. 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  He notes that he developed a fever yesterday up to 103F.  He took tylenol at home and it improved, but kept coming back.  He also had right flank/lower back pain which started at the same time.  He denied cough, fever, production of phlegm, abdominal pain, diarrhea, constipation, chest pain, wheezing, sick contacts.  He did report some nausea which is common at times for him.  On review of chart, he was seen in the ED on 01/10/2020 for acute gout and he was prescribed ibuprofen and a 5 day prednisone taper which he should have completed today.  These medications should have masked any fever and he did not complain of any pain today in his finger.    ED Course: In the ED, he was noted to be febrile, he had a WBC of 16, however, he has a chronically elevated WBC.  He had a normal to high blood pressure (not currently on medications.  He had a mildly low Na at 132, mildly elevated bilirubin at 1.3 with normal LFTs, L.A of 1.3, H/H of 7.5 and 25 which is lower than 1 month ago at last oncology visit (no report of bleeding, no blood in urine).  POC occult blood in the stool has not been collected as of yet.  Will monitor for bleeding, recheck in the AM for confirmation.   Review of Systems: As per HPI otherwise 10 point review of systems negative.    Past Medical History:  Diagnosis Date  . Arthritis    "knees; left hand"  . Cancer ([place])   . Gout   . Heart murmur    Longstanding. 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 Hypertension   . Near syncope    05/2011 with tachypalpitations & with echo showing mild LVH, normal EF  55-60%, trivial AR    Past Surgical History:  Procedure Laterality Date  . IR IMAGING GUIDED PORT INSERTION  10/26/2018  . NO PAST SURGERIES       reports that he has never smoked. He has never used smokeless tobacco. He reports previous alcohol use. He reports that he does not use drugs.  No Known Allergies  Reviewed with patient.  Family History  Problem Relation Age of Onset  . Cancer Mother        Possibly pancreatic. Died at age 27  . Prostate cancer Father   . Breast cancer Cousin   . Diabetes Paternal Uncle   . Heart disease Paternal Uncle     Prior to Admission medications   Medication Sig Start Date End Date Taking? Authorizing Provider  allopurinol (ZYLOPRIM) 100 MG tablet Take 2 tablets (200 mg total) by mouth daily. Patient not taking: Reported on 11/28/2019 01/26/19   Brunetta Genera, MD  colchicine 0.6 MG tablet Take 1 tablet (0.6 mg total) by mouth See admin instructions. Take 1.2mg  (2 tablets) followed by .6mg  (1 tablet) 1 hour later. Patient not taking: Reported on 11/28/2019 03/27/18   Molpus, Jenny Reichmann, MD  dexamethasone (DECADRON) 4 MG tablet 4mg  po daily with breakfast for 2 weeks then every other day for 2 weeks Patient not  taking: Reported on 12/19/2019 12/24/18   Brunetta Genera, MD  dexamethasone (DECADRON) 4 MG tablet Take 2 tablets (8 mg total) by mouth daily. Start the day after chemotherapy for 1 day. 10/06/19   Brunetta Genera, MD  furosemide (LASIX) 20 MG tablet Take 1 tablet (20 mg total) by mouth daily. Patient not taking: Reported on 11/28/2019 11/24/18   Brunetta Genera, MD  ibuprofen (ADVIL) 800 MG tablet Take 1 tablet (800 mg total) by mouth 3 (three) times daily for 5 days. 01/10/20 01/15/20  Tedd Sias, PA  lidocaine-prilocaine (EMLA) cream Apply to affected area once 10/06/19   Brunetta Genera, MD  LORazepam (ATIVAN) 0.5 MG tablet Take 1 tablet (0.5 mg total) by mouth every 6 (six) hours as needed (Nausea or vomiting). 01/02/20    Brunetta Genera, MD  ondansetron (ZOFRAN ODT) 4 MG disintegrating tablet Take 1 tablet (4 mg total) by mouth every 8 (eight) hours as needed for nausea or vomiting. Patient not taking: Reported on 11/28/2019 10/01/18   Francine Graven, DO  ondansetron (ZOFRAN) 8 MG tablet Take 1 tablet (8 mg total) by mouth 2 (two) times daily as needed for refractory nausea / vomiting. Start on day 3 after carboplatin chemo. 10/06/19   Brunetta Genera, MD  oxyCODONE (OXY IR/ROXICODONE) 5 MG immediate release tablet Take 1-2 tablets (5-10 mg total) by mouth every 4 (four) hours as needed for severe pain. 01/02/20   Brunetta Genera, MD  potassium chloride SA (K-DUR,KLOR-CON) 20 MEQ tablet Take 1 tablet (20 mEq total) by mouth daily. With lasix Patient not taking: Reported on 11/28/2019 11/24/18   Brunetta Genera, MD  predniSONE (STERAPRED UNI-PAK 21 TAB) 10 MG (21) TBPK tablet Take by mouth daily. Take 6 tabs by mouth daily  for 2 days, then 5 tabs for 2 days, then 4 tabs for 2 days, then 3 tabs for 2 days, 2 tabs for 2 days, then 1 tab by mouth daily for 2 days 01/10/20   Tedd Sias, PA  prochlorperazine (COMPAZINE) 10 MG tablet Take 1 tablet (10 mg total) by mouth every 6 (six) hours as needed (Nausea or vomiting). Patient not taking: Reported on 11/28/2019 10/20/18   Brunetta Genera, MD  senna-docusate (SENNA S) 8.6-50 MG tablet Take 2 tablets by mouth at bedtime. 08/29/19   Brunetta Genera, MD    Physical Exam: Vitals:   01/15/20 2000 01/15/20 2015 01/15/20 2030 01/15/20 2045  BP: (!) 161/78 (!) 161/77 (!) 160/79 (!) 161/80  Pulse: 80 81 77 79  Resp: (!) 23 20 19  (!) 22  Temp:      TempSrc:      SpO2: 95% 97% 98% 95%  Weight:      Height:        Constitutional: NAD, calm, comfortable Eyes: PERRL, lids and conjunctivae normal ENMT: Mucous membranes are moist. Poor dentition, no pain   Neck: normal, supple - he has a port line running into neck, non tender to  palpation Respiratory: He has rales in the right base (mild) no other crackles or rales, no wheezing  Cardiovascular: RR, NR, + murmur noted systolic (chronic per patient), no lower extremity edema.  He has an accessed port in the right upper chest.  Non tender, no erythema Abdomen: NT, mildly distended, but soft.  +BS.  He has some mild pain in the right lower back, no pain with deep breathing.  Musculoskeletal: no clubbing / cyanosis.  Normal muscle tone.  His finger is back to normal ROM after gout attack.   Skin: no rashes, lesions, ulcers on exposed skin Neurologic: Grossly intact, moving easily in bed Psychiatric: Normal judgment and insight. Alert and oriented x 3. Normal mood.   Labs on Admission: I have personally reviewed following labs and imaging studies  CBC: Recent Labs  Lab 01/15/20 1542  WBC 16.6*  NEUTROABS 12.7*  HGB 7.5*  HCT 25.0*  MCV 97.3  PLT 161*   Basic Metabolic Panel: Recent Labs  Lab 01/15/20 1542  NA 132*  K 3.9  CL 99  CO2 23  GLUCOSE 145*  BUN 16  CREATININE 0.94  CALCIUM 8.3*   GFR: Estimated Creatinine Clearance: 79.1 mL/min (by C-G formula based on SCr of 0.94 mg/dL). Liver Function Tests: Recent Labs  Lab 01/15/20 1542  AST 21  ALT 22  ALKPHOS 118  BILITOT 1.3*  PROT 7.6  ALBUMIN 3.2*   No results for input(s): LIPASE, AMYLASE in the last 168 hours. No results for input(s): AMMONIA in the last 168 hours. Coagulation Profile: Recent Labs  Lab 01/15/20 1542  INR 1.2   Cardiac Enzymes: No results for input(s): CKTOTAL, CKMB, CKMBINDEX, TROPONINI in the last 168 hours. BNP (last 3 results) No results for input(s): PROBNP in the last 8760 hours. HbA1C: No results for input(s): HGBA1C in the last 72 hours. CBG: No results for input(s): GLUCAP in the last 168 hours. Lipid Profile: No results for input(s): CHOL, HDL, LDLCALC, TRIG, CHOLHDL, LDLDIRECT in the last 72 hours. Thyroid Function Tests: No results for input(s):  TSH, T4TOTAL, FREET4, T3FREE, THYROIDAB in the last 72 hours. Anemia Panel: No results for input(s): VITAMINB12, FOLATE, FERRITIN, TIBC, IRON, RETICCTPCT in the last 72 hours. Urine analysis:    Component Value Date/Time   COLORURINE YELLOW 01/15/2020 2054   APPEARANCEUR CLEAR 01/15/2020 2054   LABSPEC 1.019 01/15/2020 2054   PHURINE 6.0 01/15/2020 2054   GLUCOSEU NEGATIVE 01/15/2020 2054   HGBUR NEGATIVE 01/15/2020 2054   BILIRUBINUR NEGATIVE 01/15/2020 2054   KETONESUR 5 (A) 01/15/2020 2054   PROTEINUR NEGATIVE 01/15/2020 2054   UROBILINOGEN 0.2 06/15/2012 1746   NITRITE NEGATIVE 01/15/2020 2054   LEUKOCYTESUR NEGATIVE 01/15/2020 2054    Radiological Exams on Admission: CT Abdomen Pelvis W Contrast  Result Date: 01/15/2020 CLINICAL DATA:  Fever and back pain. EXAM: CT ABDOMEN AND PELVIS WITH CONTRAST TECHNIQUE: Multidetector CT imaging of the abdomen and pelvis was performed using the standard protocol following bolus administration of intravenous contrast. CONTRAST:  173mL OMNIPAQUE IOHEXOL 300 MG/ML  SOLN COMPARISON:  December 12, 2019 FINDINGS: Lower chest: Mild atelectasis and/or infiltrate is seen within the posterior aspect of the right lung base. Hepatobiliary: Numerous heterogeneous liver lesions are seen scattered throughout a cirrhotic liver. This is stable in appearance when compared to the prior study. No gallstones or gallbladder wall thickening. Mild central hepatic biliary dilatation is noted. Pancreas: A stable 1.9 cm low-attenuation lesion is seen within the pancreatic tail. Spleen: There is mild, stable splenomegaly. Multiple small calcified granulomas are seen within the spleen. Tortuous vessels are noted along the splenic hilum. Adrenals/Urinary Tract: Adrenal glands are unremarkable. Kidneys are normal, without renal calculi, focal lesion, or hydronephrosis. The hyperattenuating lesion described within the upper pole of the right kidney on the prior study is not clearly  visualized on the current exam. Bladder is unremarkable. Stomach/Bowel: Stomach is within normal limits. Appendix appears normal. No evidence of bowel wall thickening, distention, or inflammatory changes. Vascular/Lymphatic: Moderate severity aortic  atherosclerosis. No enlarged abdominal or pelvic lymph nodes. Reproductive: Prostate is unremarkable. Other: A very small amount of pelvic free fluid is noted. Musculoskeletal: Stable heterogeneous sclerotic areas are again seen within the spine, pelvis and proximal femurs. IMPRESSION: 1. Mild atelectasis and/or infiltrate within the posterior aspect of the right lung base. 2. Cirrhotic liver with evidence of portal hypertension and diffuse hepatic metastasis. 3. Stable 1.9 cm low-attenuation lesion within the pancreatic tail. 4. Stable heterogeneous sclerotic areas within the spine, pelvis and proximal femurs. 5. Very small amount of pelvic free fluid. Aortic Atherosclerosis (ICD10-I70.0). Electronically Signed   By: Virgina Norfolk M.D.   On: 01/15/2020 18:30   DG Chest Port 1 View  Result Date: 01/15/2020 CLINICAL DATA:  Fever, right-sided back pain EXAM: PORTABLE CHEST 1 VIEW COMPARISON:  09/26/2019 FINDINGS: The heart size and mediastinal contours are within normal limits. Right chest port catheter. Low volume AP portable examination with heterogeneous airspace opacity of the right lung base. The visualized skeletal structures are unremarkable. IMPRESSION: Low volume AP portable examination with heterogeneous airspace opacity of the right lung base, which may reflect atelectasis or developing airspace disease. PA and lateral radiographs may be helpful to further evaluate. Electronically Signed   By: Eddie Candle M.D.   On: 01/15/2020 15:58    EKG: Independently reviewed. Sinus, LVH, no significant ST changes  Assessment/Plan Sepsis, (fever, tachypnea, elevated WBC, unknown source) Chronic leukocytosis - given history of cancer, it is prudent to treat  with IV antibiotics and monitor for source - Monitor for fever, tylenol for fever - IV Rocephin and Azithromycin for CAP  - UA without sign of infection - BC and UC sent and pending - Encourage PO intake, IVF if unable to take in PO (BP is stable to high, no IVF at this time) - Consider discussion with Oncology in the AM for further recommendations - Trend lactate and WBC - Airborne and contact until SARS PCR returns (POC negative) - Telemetry X 12 hours while in the Acute phase, can be discontinued if improving  Anemia of chronic disease, acute worsening - Patient reports no bleeding - POC occult blood in the stool ordered and pending - Trend CBC - Transfuse for HCT < 7.0 - Monitor on telemetry  Gout - Acute flare resolved - Monitor for worsening - He completed a course of prednisone given in the ED    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  - Trazodone PRN for sleep  Elevated BP - He has a history of HTN, but currently not treated with home medications - BP has been high while here, but unclear cause at this time - PRN hydralazine for sustained elevated SBP - If BP remains high, would consider starting daily therapy      DVT prophylaxis: Lovenox  Code Status: Full  Disposition Plan: Admit for IV Abx Consults called: Consider calling oncology Admission status: Telemetry, INP    Gilles Chiquito MD [facility]   If 7PM-7AM, please contact night-coverage [email or web address] Use universal [place] password for that web site. If you do not have the password, please call the hospital operator.  01/15/2020, 9:44 PM

## 2020-01-15 NOTE — ED Provider Notes (Signed)
[facility] DEPT Provider Note   CSN: [id number] Arrival date & time: 01/15/20  1503     History Chief Complaint  Patient presents with  . Fever  . Back Pain    [name] is a 59 y.o. male.  The history is provided by the patient and medical records. No language interpreter was used.  Fever Back Pain Associated symptoms: fever    [name] is a 59 y.o. male who presents to the Emergency Department complaining of fever.  He developed fever to 103 yesterday.  He has associated right sided back pain. Pain is described as aching in nature, constant.  It is non radiating.  Denies cough, sob, chest pain, abdominal pain, N/V/D, dysuria, leg swelling, numbness/weakness.  He has mild headache.  No loss of taste/smell.  No covid19 exposures.  He was recently seen for gout in right hand - now resolved. He was treated with prednisone and ibuprofen.  He has not taken anything at home for fever.      Past Medical History:  Diagnosis Date  . Arthritis    "knees; left hand"  . Cancer ([place])   . Gout   . Heart murmur    Longstanding. 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 Hypertension   . Near syncope    05/2011 with tachypalpitations & with echo showing mild LVH, normal EF 55-60%, trivial AR    Patient Active Problem List   Diagnosis Date Noted  . Sepsis ([place]) 01/15/2020  . Community acquired pneumonia of right lower lobe of lung   . Fever   . Flank pain   . Cellulitis 12/11/2018  . Port-A-Cath in place 11/15/2018  . Bone metastases ([place]) 10/20/2018  . 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 Counseling regarding advance care planning and goals of care 10/20/2018  . Gout 03/21/2016  . Chest pain 02/18/2012  . Alcohol abuse 02/18/2012  . Syncope 05/28/2011  . Palpitations 05/28/2011  . Murmur 05/28/2011    Past Surgical History:  Procedure Laterality Date  . IR  IMAGING GUIDED PORT INSERTION  10/26/2018  . NO PAST SURGERIES         Family History  Problem Relation Age of Onset  . Cancer Mother        Possibly pancreatic. Died at age 68  . Prostate cancer Father   . Breast cancer Cousin   . Diabetes Paternal Uncle   . Heart disease Paternal Uncle     Social History   Tobacco Use  . Smoking status: Never Smoker  . Smokeless tobacco: Never Used  Substance Use Topics  . Alcohol use: Not Currently    Comment: states he has cut back/ every now and again  . Drug use: No    Home Medications Prior to Admission medications   Medication Sig Start Date End Date Taking? Authorizing Provider  allopurinol (ZYLOPRIM) 100 MG tablet Take 2 tablets (200 mg total) by mouth daily. Patient not taking: Reported on 11/28/2019 01/26/19   Brunetta Genera, MD  colchicine 0.6 MG tablet Take 1 tablet (0.6 mg total) by mouth See admin instructions. Take 1.2mg  (2 tablets) followed by .6mg  (1 tablet) 1 hour later. Patient not taking: Reported on 11/28/2019 03/27/18   Molpus, Jenny Reichmann, MD  dexamethasone (DECADRON) 4 MG tablet 4mg  po daily with breakfast for 2 weeks then every other day for 2 weeks Patient not taking: Reported on 12/19/2019 12/24/18  Brunetta Genera, MD  dexamethasone (DECADRON) 4 MG tablet Take 2 tablets (8 mg total) by mouth daily. Start the day after chemotherapy for 1 day. 10/06/19   Brunetta Genera, MD  furosemide (LASIX) 20 MG tablet Take 1 tablet (20 mg total) by mouth daily. Patient not taking: Reported on 11/28/2019 11/24/18   Brunetta Genera, MD  ibuprofen (ADVIL) 800 MG tablet Take 1 tablet (800 mg total) by mouth 3 (three) times daily for 5 days. 01/10/20 01/15/20  Tedd Sias, PA  lidocaine-prilocaine (EMLA) cream Apply to affected area once 10/06/19   Brunetta Genera, MD  LORazepam (ATIVAN) 0.5 MG tablet Take 1 tablet (0.5 mg total) by mouth every 6 (six) hours as needed (Nausea or vomiting). 01/02/20   Brunetta Genera,  MD  ondansetron (ZOFRAN ODT) 4 MG disintegrating tablet Take 1 tablet (4 mg total) by mouth every 8 (eight) hours as needed for nausea or vomiting. Patient not taking: Reported on 11/28/2019 10/01/18   Francine Graven, DO  ondansetron (ZOFRAN) 8 MG tablet Take 1 tablet (8 mg total) by mouth 2 (two) times daily as needed for refractory nausea / vomiting. Start on day 3 after carboplatin chemo. 10/06/19   Brunetta Genera, MD  oxyCODONE (OXY IR/ROXICODONE) 5 MG immediate release tablet Take 1-2 tablets (5-10 mg total) by mouth every 4 (four) hours as needed for severe pain. 01/02/20   Brunetta Genera, MD  potassium chloride SA (K-DUR,KLOR-CON) 20 MEQ tablet Take 1 tablet (20 mEq total) by mouth daily. With lasix Patient not taking: Reported on 11/28/2019 11/24/18   Brunetta Genera, MD  predniSONE (STERAPRED UNI-PAK 21 TAB) 10 MG (21) TBPK tablet Take by mouth daily. Take 6 tabs by mouth daily  for 2 days, then 5 tabs for 2 days, then 4 tabs for 2 days, then 3 tabs for 2 days, 2 tabs for 2 days, then 1 tab by mouth daily for 2 days 01/10/20   Tedd Sias, PA  prochlorperazine (COMPAZINE) 10 MG tablet Take 1 tablet (10 mg total) by mouth every 6 (six) hours as needed (Nausea or vomiting). Patient not taking: Reported on 11/28/2019 10/20/18   Brunetta Genera, MD  senna-docusate (SENNA S) 8.6-50 MG tablet Take 2 tablets by mouth at bedtime. 08/29/19   Brunetta Genera, MD    Allergies    Patient has no known allergies.  Review of Systems   Review of Systems  Constitutional: Positive for fever.  Musculoskeletal: Positive for back pain.  All other systems reviewed and are negative.   Physical Exam Updated Vital Signs BP (!) 164/74   Pulse 98   Temp (!) 102.6 F (39.2 C) (Oral)   Resp 19   Ht 5\' 7"  (1.702 m)   Wt 72.6 kg   SpO2 100%   BMI 25.06 kg/m   Physical Exam Vitals and nursing note reviewed.  Constitutional:      Appearance: He is well-developed.  HENT:      Head: Normocephalic and atraumatic.  Cardiovascular:     Rate and Rhythm: Regular rhythm. Tachycardia present.     Heart sounds: Murmur present.  Pulmonary:     Effort: Pulmonary effort is normal. No respiratory distress.     Breath sounds: Normal breath sounds.  Abdominal:     Palpations: Abdomen is soft.     Tenderness: There is no abdominal tenderness. There is no right CVA tenderness, left CVA tenderness, guarding or rebound.  Musculoskeletal:  General: No tenderness.     Comments: Trace pitting edema to BLE.    Skin:    General: Skin is warm and dry.  Neurological:     Mental Status: He is alert and oriented to person, place, and time.     Comments: 5/5 strength in all four extremities with sensation to light touch intact in all four extremities.    Psychiatric:        Behavior: Behavior normal.     ED Results / Procedures / Treatments   Labs (all labs ordered are listed, but only abnormal results are displayed) Labs Reviewed  COMPREHENSIVE METABOLIC PANEL - Abnormal; Notable for the following components:      Result Value   Sodium 132 (*)    Glucose, Bld 145 (*)    Calcium 8.3 (*)    Albumin 3.2 (*)    Total Bilirubin 1.3 (*)    All other components within normal limits  CBC WITH DIFFERENTIAL/PLATELET - Abnormal; Notable for the following components:   WBC 16.6 (*)    RBC 2.57 (*)    Hemoglobin 7.5 (*)    HCT 25.0 (*)    RDW 18.7 (*)    Platelets 113 (*)    Neutro Abs 12.7 (*)    Monocytes Absolute 2.4 (*)    Abs Immature Granulocytes 0.43 (*)    All other components within normal limits  APTT - Abnormal; Notable for the following components:   aPTT 40 (*)    All other components within normal limits  URINALYSIS, ROUTINE W REFLEX MICROSCOPIC - Abnormal; Notable for the following components:   Ketones, ur 5 (*)    All other components within normal limits  CULTURE, BLOOD (ROUTINE X 2)  CULTURE, BLOOD (ROUTINE X 2)  SARS CORONAVIRUS 2 (TAT 6-24 HRS)    URINE CULTURE  LACTIC ACID, PLASMA  PROTIME-INR  LACTIC ACID, PLASMA  POC SARS CORONAVIRUS 2 AG -  ED  POC OCCULT BLOOD, ED    EKG EKG Interpretation  Date/Time:  Sunday January 15 2020 15:59:39 EDT Ventricular Rate:  93 PR Interval:    QRS Duration: 140 QT Interval:  341 QTC Calculation: 425 R Axis:   -9 Text Interpretation: Sinus rhythm Left ventricular hypertrophy Confirmed by Quintella Reichert [phone]) on 01/15/2020 4:09:06 PM   Radiology CT Abdomen Pelvis W Contrast  Result Date: 01/15/2020 CLINICAL DATA:  Fever and back pain. EXAM: CT ABDOMEN AND PELVIS WITH CONTRAST TECHNIQUE: Multidetector CT imaging of the abdomen and pelvis was performed using the standard protocol following bolus administration of intravenous contrast. CONTRAST:  187mL OMNIPAQUE IOHEXOL 300 MG/ML  SOLN COMPARISON:  December 12, 2019 FINDINGS: Lower chest: Mild atelectasis and/or infiltrate is seen within the posterior aspect of the right lung base. Hepatobiliary: Numerous heterogeneous liver lesions are seen scattered throughout a cirrhotic liver. This is stable in appearance when compared to the prior study. No gallstones or gallbladder wall thickening. Mild central hepatic biliary dilatation is noted. Pancreas: A stable 1.9 cm low-attenuation lesion is seen within the pancreatic tail. Spleen: There is mild, stable splenomegaly. Multiple small calcified granulomas are seen within the spleen. Tortuous vessels are noted along the splenic hilum. Adrenals/Urinary Tract: Adrenal glands are unremarkable. Kidneys are normal, without renal calculi, focal lesion, or hydronephrosis. The hyperattenuating lesion described within the upper pole of the right kidney on the prior study is not clearly visualized on the current exam. Bladder is unremarkable. Stomach/Bowel: Stomach is within normal limits. Appendix appears normal. No evidence of  bowel wall thickening, distention, or inflammatory changes. Vascular/Lymphatic: Moderate  severity aortic atherosclerosis. No enlarged abdominal or pelvic lymph nodes. Reproductive: Prostate is unremarkable. Other: A very small amount of pelvic free fluid is noted. Musculoskeletal: Stable heterogeneous sclerotic areas are again seen within the spine, pelvis and proximal femurs. IMPRESSION: 1. Mild atelectasis and/or infiltrate within the posterior aspect of the right lung base. 2. Cirrhotic liver with evidence of portal hypertension and diffuse hepatic metastasis. 3. Stable 1.9 cm low-attenuation lesion within the pancreatic tail. 4. Stable heterogeneous sclerotic areas within the spine, pelvis and proximal femurs. 5. Very small amount of pelvic free fluid. Aortic Atherosclerosis (ICD10-I70.0). Electronically Signed   By: Virgina Norfolk M.D.   On: 01/15/2020 18:30   DG Chest Port 1 View  Result Date: 01/15/2020 CLINICAL DATA:  Fever, right-sided back pain EXAM: PORTABLE CHEST 1 VIEW COMPARISON:  09/26/2019 FINDINGS: The heart size and mediastinal contours are within normal limits. Right chest port catheter. Low volume AP portable examination with heterogeneous airspace opacity of the right lung base. The visualized skeletal structures are unremarkable. IMPRESSION: Low volume AP portable examination with heterogeneous airspace opacity of the right lung base, which may reflect atelectasis or developing airspace disease. PA and lateral radiographs may be helpful to further evaluate. Electronically Signed   By: Eddie Candle M.D.   On: 01/15/2020 15:58    Procedures Procedures (including critical care time)  Medications Ordered in ED Medications  sodium chloride (PF) 0.9 % injection (has no administration in time range)  sodium chloride 0.9 % bolus 1,000 mL (0 mLs Intravenous Stopped 01/15/20 1828)  cefTRIAXone (ROCEPHIN) 1 g in sodium chloride 0.9 % 100 mL IVPB (0 g Intravenous Stopped 01/15/20 1828)  azithromycin (ZITHROMAX) tablet 500 mg (500 mg Oral Given 01/15/20 1624)  morphine 4 MG/ML  injection 4 mg (4 mg Intravenous Given 01/15/20 1824)  acetaminophen (TYLENOL) tablet 650 mg (650 mg Oral Given 01/15/20 1825)  iohexol (OMNIPAQUE) 300 MG/ML solution 100 mL (100 mLs Intravenous Contrast Given 01/15/20 1800)    ED Course  I have reviewed the triage vital signs and the nursing notes.  Pertinent labs & imaging results that were available during my care of the patient were reviewed by me and considered in my medical decision making (see chart for details).    MDM Rules/Calculators/A&P                      Patient currently undergoing chemotherapy for cancer here for evaluation of fever, right flank pain. He is non-toxic appearing on evaluation. Chest x-rays concerning for pneumonia, he was started on antibiotics for community acquired pneumonia. CT scan was obtained due to flank pain, rule out pyelonephritis or obstructing stone. Scan is negative for stone or pyelo. Labs with leukocytosis, improved when compared to priors but worsening anemia. Patient denies any blocker blood he stools or overt bleeding at home. Given patient significant fever, ongoing pain plan to admit for further treatment. Patient updated findings of studies and he is in agreement with treatment plan. Hospitalist consulted for admission. Final Clinical Impression(s) / ED Diagnoses Final diagnoses:  Community acquired pneumonia of right lower lobe of lung  Fever, unspecified fever cause  Flank pain    Rx / DC Orders ED Discharge Orders    None       Quintella Reichert, MD 01/15/20 2354

## 2020-01-15 NOTE — ED Notes (Signed)
Provider states second Lactic acid is not required.

## 2020-01-15 NOTE — ED Triage Notes (Signed)
Pt is a liver CA pt that presents with a fever of 103 and back pain that started yesterday.  Pt states he has not taken any fever reduction medication.  Pt a/o x 4 and ambulatory.  Temperature in triage was 102.6. Pt denies any other symptoms in triage.

## 2020-01-15 NOTE — ED Notes (Signed)
Lab called to follow up on urine. Informed by lab at this time that the urine had leaked from cup and couldn't be used. Another urine sample sent.

## 2020-01-16 ENCOUNTER — Encounter (HOSPITAL_COMMUNITY): Payer: Medicaid Other

## 2020-01-16 DIAGNOSIS — A419 Sepsis, unspecified organism: Secondary | ICD-10-CM

## 2020-01-16 DIAGNOSIS — U071 COVID-19: Secondary | ICD-10-CM

## 2020-01-16 LAB — INFLUENZA PANEL BY PCR (TYPE A & B)
Influenza A By PCR: NEGATIVE
Influenza B By PCR: NEGATIVE

## 2020-01-16 LAB — BRAIN NATRIURETIC PEPTIDE: B Natriuretic Peptide: 81.9 pg/mL (ref 0.0–100.0)

## 2020-01-16 LAB — CBC
HCT: 23.9 % — ABNORMAL LOW (ref 39.0–52.0)
Hemoglobin: 7.2 g/dL — ABNORMAL LOW (ref 13.0–17.0)
MCH: 29.4 pg (ref 26.0–34.0)
MCHC: 30.1 g/dL (ref 30.0–36.0)
MCV: 97.6 fL (ref 80.0–100.0)
Platelets: 103 10*3/uL — ABNORMAL LOW (ref 150–400)
RBC: 2.45 MIL/uL — ABNORMAL LOW (ref 4.22–5.81)
RDW: 18.5 % — ABNORMAL HIGH (ref 11.5–15.5)
WBC: 16.4 10*3/uL — ABNORMAL HIGH (ref 4.0–10.5)
nRBC: 0 % (ref 0.0–0.2)

## 2020-01-16 LAB — BASIC METABOLIC PANEL
Anion gap: 8 (ref 5–15)
BUN: 14 mg/dL (ref 6–20)
CO2: 24 mmol/L (ref 22–32)
Calcium: 8.1 mg/dL — ABNORMAL LOW (ref 8.9–10.3)
Chloride: 100 mmol/L (ref 98–111)
Creatinine, Ser: 1.01 mg/dL (ref 0.61–1.24)
GFR calc Af Amer: >60 mL/min (ref >60)
GFR calc non Af Amer: >60 mL/min (ref >60)
Glucose, Bld: 113 mg/dL — ABNORMAL HIGH (ref 70–99)
Potassium: 3.9 mmol/L (ref 3.5–5.1)
Sodium: 132 mmol/L — ABNORMAL LOW (ref 135–145)

## 2020-01-16 LAB — PROCALCITONIN: Procalcitonin: 0.47 ng/mL

## 2020-01-16 LAB — D-DIMER, QUANTITATIVE: D-Dimer, Quant: 5.98 ug/mL-FEU — ABNORMAL HIGH (ref 0.00–0.50)

## 2020-01-16 LAB — IRON AND TIBC
Iron: 16 ug/dL — ABNORMAL LOW (ref 45–182)
Saturation Ratios: 6 % — ABNORMAL LOW (ref 17.9–39.5)
TIBC: 265 ug/dL (ref 250–450)
UIBC: 249 ug/dL

## 2020-01-16 LAB — SARS CORONAVIRUS 2 (TAT 6-24 HRS): SARS Coronavirus 2: POSITIVE — AB

## 2020-01-16 LAB — GLUCOSE, CAPILLARY: Glucose-Capillary: 101 mg/dL — ABNORMAL HIGH (ref 70–99)

## 2020-01-16 LAB — RETICULOCYTES
Immature Retic Fract: 30.5 % — ABNORMAL HIGH (ref 2.3–15.9)
RBC.: 2.55 MIL/uL — ABNORMAL LOW (ref 4.22–5.81)
Retic Count, Absolute: 117.3 10*3/uL (ref 19.0–186.0)
Retic Ct Pct: 4.6 % — ABNORMAL HIGH (ref 0.4–3.1)

## 2020-01-16 LAB — C-REACTIVE PROTEIN: CRP: 12.8 mg/dL — ABNORMAL HIGH (ref <1.0)

## 2020-01-16 LAB — ABO/RH: ABO/RH(D): A POS

## 2020-01-16 LAB — FOLATE: Folate: 14.9 ng/mL (ref >5.9)

## 2020-01-16 LAB — FIBRINOGEN: Fibrinogen: 461 mg/dL (ref 210–475)

## 2020-01-16 LAB — LACTATE DEHYDROGENASE: LDH: 190 U/L (ref 98–192)

## 2020-01-16 LAB — FERRITIN: Ferritin: 110 ng/mL (ref 24–336)

## 2020-01-16 LAB — VITAMIN B12: Vitamin B-12: 1215 pg/mL — ABNORMAL HIGH (ref 180–914)

## 2020-01-16 LAB — HIV ANTIBODY (ROUTINE TESTING W REFLEX): HIV Screen 4th Generation wRfx: NONREACTIVE

## 2020-01-16 MED ORDER — ACETAMINOPHEN 325 MG PO TABS
650.0000 mg | ORAL_TABLET | Freq: Four times a day (QID) | ORAL | Status: DC | PRN
  Administered 2020-01-16 – 2020-01-19 (×4): 650 mg via ORAL
  Filled 2020-01-16 (×5): qty 2

## 2020-01-16 MED ORDER — OXYCODONE HCL 5 MG PO TABS
5.0000 mg | ORAL_TABLET | ORAL | Status: DC | PRN
  Administered 2020-01-16 – 2020-01-18 (×2): 5 mg via ORAL
  Filled 2020-01-16 (×2): qty 1

## 2020-01-16 MED ORDER — LORAZEPAM 0.5 MG PO TABS: 0.5000 mg | ORAL_TABLET | Freq: Four times a day (QID) | ORAL | 0 refills | Status: AC | PRN

## 2020-01-16 MED ORDER — SODIUM CHLORIDE 0.9 % IV SOLN
100.0000 mg | Freq: Once | INTRAVENOUS | Status: AC
  Administered 2020-01-16: 100 mg via INTRAVENOUS
  Filled 2020-01-16: qty 20

## 2020-01-16 MED ORDER — ACETAMINOPHEN 650 MG RE SUPP: 650.0000 mg | Freq: Four times a day (QID) | RECTAL | Status: DC | PRN

## 2020-01-16 MED ORDER — LORAZEPAM 0.5 MG PO TABS: 0.5000 mg | ORAL_TABLET | Freq: Four times a day (QID) | ORAL | Status: DC | PRN

## 2020-01-16 MED ORDER — SODIUM CHLORIDE 0.9 % IV SOLN
1.0000 g | INTRAVENOUS | Status: AC
  Administered 2020-01-16 – 2020-01-19 (×4): 1 g via INTRAVENOUS
  Filled 2020-01-16 (×2): qty 1
  Filled 2020-01-16: qty 10
  Filled 2020-01-16: qty 1

## 2020-01-16 MED ORDER — AZITHROMYCIN 250 MG PO TABS
250.0000 mg | ORAL_TABLET | Freq: Every day | ORAL | Status: DC
  Administered 2020-01-16 – 2020-01-19 (×4): 250 mg via ORAL
  Filled 2020-01-16 (×4): qty 1

## 2020-01-16 MED ORDER — SODIUM CHLORIDE 0.9 % IV SOLN
100.0000 mg | Freq: Once | INTRAVENOUS | Status: AC
  Administered 2020-01-16: 100 mg via INTRAVENOUS

## 2020-01-16 MED ORDER — CHLORHEXIDINE GLUCONATE CLOTH 2 % EX PADS
6.0000 | MEDICATED_PAD | Freq: Every day | CUTANEOUS | Status: DC
  Administered 2020-01-16 – 2020-01-19 (×4): 6 via TOPICAL

## 2020-01-16 MED ORDER — TRAZODONE HCL 50 MG PO TABS: 25.0000 mg | ORAL_TABLET | Freq: Every evening | ORAL | Status: DC | PRN

## 2020-01-16 MED ORDER — ONDANSETRON HCL 4 MG PO TABS: 8.0000 mg | ORAL_TABLET | Freq: Two times a day (BID) | ORAL | Status: DC | PRN

## 2020-01-16 MED ORDER — HYDRALAZINE HCL 10 MG PO TABS: 10.0000 mg | ORAL_TABLET | Freq: Three times a day (TID) | ORAL | Status: DC | PRN

## 2020-01-16 MED ORDER — ZINC SULFATE 220 (50 ZN) MG PO CAPS
220.0000 mg | ORAL_CAPSULE | Freq: Every day | ORAL | Status: DC
  Administered 2020-01-16 – 2020-01-19 (×4): 220 mg via ORAL
  Filled 2020-01-16 (×4): qty 1

## 2020-01-16 MED ORDER — ASCORBIC ACID 500 MG PO TABS
500.0000 mg | ORAL_TABLET | Freq: Every day | ORAL | Status: DC
  Administered 2020-01-16 – 2020-01-19 (×4): 500 mg via ORAL
  Filled 2020-01-16 (×4): qty 1

## 2020-01-16 MED ORDER — LIDOCAINE-PRILOCAINE 2.5-2.5 % EX CREA
TOPICAL_CREAM | Freq: Once | CUTANEOUS | Status: DC
  Filled 2020-01-16: qty 5

## 2020-01-16 MED ORDER — ENOXAPARIN SODIUM 40 MG/0.4ML ~~LOC~~ SOLN
40.0000 mg | Freq: Two times a day (BID) | SUBCUTANEOUS | Status: DC
  Administered 2020-01-16 – 2020-01-17 (×3): 40 mg via SUBCUTANEOUS
  Filled 2020-01-16 (×3): qty 0.4

## 2020-01-16 MED ORDER — ALUM & MAG HYDROXIDE-SIMETH 200-200-20 MG/5ML PO SUSP
30.0000 mL | ORAL | Status: DC | PRN
  Administered 2020-01-16: 30 mL via ORAL
  Filled 2020-01-16: qty 30

## 2020-01-16 MED ORDER — SODIUM CHLORIDE 0.9 % IV SOLN
100.0000 mg | Freq: Every day | INTRAVENOUS | Status: DC
  Filled 2020-01-16: qty 20

## 2020-01-16 MED ORDER — ENOXAPARIN SODIUM 40 MG/0.4ML ~~LOC~~ SOLN: 40.0000 mg | SUBCUTANEOUS | Status: DC

## 2020-01-16 MED ORDER — ENOXAPARIN SODIUM 40 MG/0.4ML ~~LOC~~ SOLN
40.0000 mg | SUBCUTANEOUS | Status: DC
  Administered 2020-01-16: 40 mg via SUBCUTANEOUS
  Filled 2020-01-16: qty 0.4

## 2020-01-16 NOTE — Telephone Encounter (Signed)
Currently inpatient. Refill requested. Last RX sent on 01/02/20.

## 2020-01-16 NOTE — Progress Notes (Signed)
[name] for Lovenox Indication: VTE prophylaxis in Covid + patient  No Known Allergies  Patient Measurements: Height: 5\' 7"  (170.2 cm) Weight: 160 lb (72.6 kg) IBW/kg (Calculated) : 66.1  Vital Signs: Temp: 98.2 F (36.8 C) (03/15 1240) Temp Source: Oral (03/15 1240) BP: 162/86 (03/15 1240) Pulse Rate: 90 (03/15 1240)  Labs: Recent Labs    01/15/20 1542 01/16/20 0405  HGB 7.5* 7.2*  HCT 25.0* 23.9*  PLT 113* 103*  APTT 40*  --   LABPROT 15.2  --   INR 1.2  --   CREATININE 0.94 1.01   Estimated Creatinine Clearance: 73.6 mL/min (by C-G formula based on SCr of 1.01 mg/dL).  Medical History: Past Medical History:  Diagnosis Date  . Arthritis    "knees; left hand"  . Cancer ([place])   . Gout   . Heart murmur    Longstanding. 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 Hypertension   . Near syncope    05/2011 with tachypalpitations & with echo showing mild LVH, normal EF 55-60%, trivial AR   Medications:  Scheduled:  . vitamin C  500 mg Oral Daily  . azithromycin  250 mg Oral Daily  . Chlorhexidine Gluconate Cloth  6 each Topical Daily  . enoxaparin (LOVENOX) injection  40 mg Subcutaneous BID  . lidocaine-prilocaine   Topical Once  . zinc sulfate  220 mg Oral Daily   Assessment: Covid + patient ordered Lovenox 40mg  daily for VTE prophylaxis, Ddimer was pending - resulted > 5 (5.98), BMI < 35 Hgb 7.2, Plt 103   Goal of Therapy:  Monitor platelets by anticoagulation protocol: Yes   Plan:  Increase Lovenox to 40mg  bid  Monitor CBC, s/s bleed  Minda Ditto PharmD 01/16/2020,4:58 PM

## 2020-01-16 NOTE — TOC Progression Note (Signed)
Transition of Care [facility]) - Progression Note    Patient Details  Name: [name] MRN: [id number] Date of Birth: 03-22-1961  Transition of [facility]) CM/SW Contact  Purcell Mouton, RN Phone Number: 01/16/2020, 2:39 PM  Clinical Narrative:    Pt will discharge home with his spouse. TOC will follow for discharge needs.    Expected Discharge Plan: Home/Self Care Barriers to Discharge: No Barriers Identified  Expected Discharge Plan and Services Expected Discharge Plan: Home/Self Care       Living arrangements for the past 2 months: Single Family Home                                       Social Determinants of Health (SDOH) Interventions    Readmission Risk Interventions No flowsheet data found.

## 2020-01-16 NOTE — Progress Notes (Addendum)
PROGRESS NOTE   Nalin Mazzocco  UVO:[id number]    DOB: 03-30-1961    DOA: 01/15/2020  PCP: Patient, No Pcp Per   I have briefly reviewed patients previous medical records in [facility].  Chief Complaint:   Chief Complaint  Patient presents with  . Fever  . 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  Recently seen in the ED 3/9 for acute gout, prescribed ibuprofen and 5 days of prednisone taper.  Admitted for sepsis due to COVID-19 infection.     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  Although POC COVID-19 antigen test was negative, subsequent confirmatory test was positive.  HIV screen negative.  UA not suggestive of UTI.  Chest x-ray: Heterogeneous airspace opacity in the right lung base, which may reflect atelectasis or developing airspace disease.  CT abdomen and pelvis: Mild atelectasis and/or infiltrate within the posterior aspect of the right lung base.  Cirrhotic liver with portal hypertension and diffuse hepatic meta stasis.  Other abnormal CT findings per detailed report.  Inflammatory markers unremarkable (normal LDH, ferritin, fibrinogen).  Elevated CRP/12.8 and D-dimer 5.98.  Procalcitonin 0.47.  Initiated IV remdesivir per pharmacy.  No hypoxia and hence steroids not started.  Actemra not started due to suspected bacterial infection.  Continue empirically  started IV ceftriaxone and azithromycin.  Blood cultures x2: Negative to date.  Sepsis physiology improved.  Elevated D-dimer likely related to acute illness, underlying malignancy, low index of suspicion for PE.  Trend daily D-dimers to ensure stability, check lower extremity venous Dopplers to rule out DVT and adjust Lovenox dose per pharmacy.  Addendum: On 3/15, during or after the 1st of the 2 infusions of Remdesvir, patient developed chest tightness and SOB reported to me by RN. He had no distress and vitals were stable and he completed the infusion. I advised RN to d/w family and proceed with 2nd infusion. RN reported that he again developed same symptoms even though the infusion was being given slower. The infusion was appropriately stopped. He again was in no distress and vitals were stable. I advised the RN to DC Remdesvir. Today, I updated today's attending MD re yesterday's events.  Anemia of chronic disease/malignancy: Hemoglobin dropped from 7.5 on admission to 7.2.  Hemoglobin was 9.9 on 2/15.  No bleeding reported.  Check anemia panel.  Follow CBC in a.m. and transfuse if hemoglobin 7 g or less.  No bleeding reported.  Thrombocytopenia: Appears chronic and stable.  Gout: Acute flare seems to have resolved.  Recently completed a course of prednisone given in the ED.  Metastatic high-grade neuroendocrine carcinoma: With liver and bony meta stasis.  Sees Dr. Irene Limbo, oncology.  Completed last chemotherapy on 12/21/2019 with plans for 8-week follow-up with imaging.  Has Port-A-Cath, site without acute findings but could also be source of infection and fever.  Hypertension: Not on medications at home.  Mildly uncontrolled  here.  As needed hydralazine.  Body mass index is 25.06 kg/m.   DVT prophylaxis: Lovenox, dose per pharmacy given COVID-19 infection underlying malignancy.  Low index of suspicion for PE given lack of respiratory symptoms including dyspnea no hypoxia or tachycardia. Code  Status: Full Family Communication: None at bedside Disposition:  . Patient came from: Home           . Anticipated d/c place: Home . Barriers to d/c: Sepsis due to COVID-19 infection and bacterial pneumonia   Consultants:   None  Procedures:   None  Antimicrobials:   IV ceftriaxone and azithromycin   Subjective:  Feels better.  Fevers down.  "Ready to go".  No chest pain.  Has right mid back/flank pain which is better compared to admission.  Denies dyspnea or cough.  No nausea, vomiting or diarrhea  Objective:   Vitals:   01/16/20 0217 01/16/20 0244 01/16/20 0834 01/16/20 1240  BP:  (!) 157/75 (!) 163/73 (!) 162/86  Pulse:  81 83 90  Resp:  _0 Temp: 99.4 F (37.4 C) 99.7 F (37.6 C) 99.5 F (37.5 C) 98.2 F (36.8 C)  TempSrc: Oral Oral Oral Oral  SpO2:  99% 99% 99%  Weight:      Height:        General exam: Pleasant young male, moderately built and nourished lying comfortably supine in bed.  Oral mucosa moist. Respiratory system: Clear to auscultation. Respiratory effort normal.  Port-A-Cath right upper anterior chest without acute findings. Cardiovascular system: S1 & S2 heard, RRR. No JVD, murmurs, rubs, gallops or clicks. No pedal edema.  Telemetry personally reviewed: Sinus rhythm. Gastrointestinal system: Abdomen is nondistended, soft and nontender. No organomegaly or masses felt. Normal bowel sounds heard. Central nervous system: Alert and oriented. No focal neurological deficits. Extremities: Symmetric 5 x 5 power. Skin: No rashes, lesions or ulcers Psychiatry: Judgement and insight appear normal. Mood & affect appropriate.     Data Reviewed:   I have personally reviewed following labs and imaging studies   CBC: Recent Labs  Lab 01/15/20 1542 01/16/20 0405  WBC 16.6* 16.4*  NEUTROABS 12.7*  --   HGB 7.5* 7.2*  HCT 25.0* 23.9*  MCV 97.3 97.6  PLT 113* 103*    Basic Metabolic Panel: Recent Labs  Lab 01/15/20 1542 01/16/20 0405  NA  132* 132*  K 3.9 3.9  CL 99 100  CO2 23 24  GLUCOSE 145* 113*  BUN 16 14  CREATININE 0.94 1.01  CALCIUM 8.3* 8.1*    Liver Function Tests: Recent Labs  Lab 01/15/20 1542  AST 21  ALT 22  ALKPHOS 118  BILITOT 1.3*  PROT 7.6  ALBUMIN 3.2*    CBG: Recent Labs  Lab 01/16/20 0831  GLUCAP 101*    Microbiology Studies:   Recent Results (from the past 240 hour(s))  Blood Culture (routine x 2)     Status: None (Preliminary result)   Collection Time: 01/15/20  3:42 PM   Specimen: BLOOD  Result Value Ref Range Status   Specimen Description BLOOD PORTA CATH  Final   Special Requests   Final    BOTTLES DRAWN AEROBIC AND ANAEROBIC Blood Culture adequate volume Performed at [facility], [place] [address], [place], [place] [address]    Culture NO GROWTH < 12 HOURS  Final   Report Status PENDING  Incomplete  Blood Culture (routine x 2)     Status: None (Preliminary result)   Collection Time:  01/15/20  3:47 PM   Specimen: BLOOD  Result Value Ref Range Status   Specimen Description BLOOD LEFT ARM  Final   Special Requests   Final    BOTTLES DRAWN AEROBIC AND ANAEROBIC Blood Culture adequate volume Performed at [place] [address] [place], [place] [address]    Culture NO GROWTH < 12 HOURS  Final   Report Status PENDING  Incomplete  SARS CORONAVIRUS 2 (TAT 6-24 HRS) Nasopharyngeal Nasopharyngeal Swab     Status: Abnormal   Collection Time: 01/15/20  5:49 PM   Specimen: Nasopharyngeal Swab  Result Value Ref Range Status   SARS Coronavirus 2 POSITIVE (A) NEGATIVE Final    Comment: RESULT CALLED TO, READ BACK BY AND VERIFIED WITH: J. TALKINGTON,CHARGE RN [id number] 01/16/2020 T. TYSOR (NOTE) SARS-CoV-2 target nucleic acids are DETECTED. The SARS-CoV-2 RNA is generally detectable in upper and lower respiratory specimens during the acute phase of infection. Positive results are indicative of the presence of SARS-CoV-2 RNA.  Clinical correlation with patient history and other diagnostic information is  necessary to determine patient infection status. Positive results do not rule out bacterial infection or co-infection with other viruses.  The expected result is Negative. Fact Sheet for Patients: SugarRoll.be Fact Sheet for Healthcare Providers: https://www.woods-mathews.com/ This test is not yet approved or cleared by the [place] FDA and  has been authorized for detection and/or diagnosis of SARS-CoV-2 by FDA under an Emergency Use Authorization (EUA). This EUA will remain  in effect (meaning this test can be  used) for the duration of the COVID-19 declaration under Section 564(b)(1) of the Act, 21 U.S.C. section 360bbb-3(b)(1), unless the authorization is terminated or revoked sooner. Performed at [facility], [place] [address] [place], [place] [address]      Radiology Studies:  CT Abdomen Pelvis W Contrast  Result Date: 01/15/2020 CLINICAL DATA:  Fever and back pain. EXAM: CT ABDOMEN AND PELVIS WITH CONTRAST TECHNIQUE: Multidetector CT imaging of the abdomen and pelvis was performed using the standard protocol following bolus administration of intravenous contrast. CONTRAST:  179m OMNIPAQUE IOHEXOL 300 MG/ML  SOLN COMPARISON:  December 12, 2019 FINDINGS: Lower chest: Mild atelectasis and/or infiltrate is seen within the posterior aspect of the right lung base. Hepatobiliary: Numerous heterogeneous liver lesions are seen scattered throughout a cirrhotic liver. This is stable in appearance when compared to the prior study. No gallstones or gallbladder wall thickening. Mild central hepatic biliary dilatation is noted. Pancreas: A stable 1.9 cm low-attenuation lesion is seen within the pancreatic tail. Spleen: There is mild, stable splenomegaly. Multiple small calcified granulomas are seen within the spleen. Tortuous vessels are noted along the splenic hilum.  Adrenals/Urinary Tract: Adrenal glands are unremarkable. Kidneys are normal, without renal calculi, focal lesion, or hydronephrosis. The hyperattenuating lesion described within the upper pole of the right kidney on the prior study is not clearly visualized on the current exam. Bladder is unremarkable. Stomach/Bowel: Stomach is within normal limits. Appendix appears normal. No evidence of bowel wall thickening, distention, or inflammatory changes. Vascular/Lymphatic: Moderate severity aortic atherosclerosis. No enlarged abdominal or pelvic lymph nodes. Reproductive: Prostate is unremarkable. Other: A very small amount of pelvic free fluid is noted. Musculoskeletal: Stable heterogeneous sclerotic areas are again seen within the spine, pelvis and proximal femurs. IMPRESSION: 1. Mild atelectasis and/or infiltrate within the posterior aspect of the right lung base. 2. Cirrhotic liver with evidence of portal hypertension and diffuse hepatic metastasis. 3. Stable 1.9 cm low-attenuation lesion within the  pancreatic tail. 4. Stable heterogeneous sclerotic areas within the spine, pelvis and proximal femurs. 5. Very small amount of pelvic free fluid. Aortic Atherosclerosis (ICD10-I70.0). Electronically Signed   By: Virgina Norfolk M.D.   On: 01/15/2020 18:30     Scheduled Meds:   . vitamin C  500 mg Oral Daily  . azithromycin  250 mg Oral Daily  . Chlorhexidine Gluconate Cloth  6 each Topical Daily  . enoxaparin (LOVENOX) injection  40 mg Subcutaneous Q24H  . lidocaine-prilocaine   Topical Once  . zinc sulfate  220 mg Oral Daily    Continuous Infusions:   . cefTRIAXone (ROCEPHIN)  IV    . remdesivir 100 mg in NS 100 mL    . [START ON 01/17/2020] remdesivir 100 mg in NS 100 mL       LOS: 1 day     Vernell Leep, MD, [place], [facility]. [facility]    To contact the attending provider between 7A-7P or the covering provider during after hours 7P-7A, please log into the web site [email or web address] and  access using universal [place] password for that web site. If you do not have the password, please call the hospital operator.  01/16/2020, 3:55 PM

## 2020-01-16 NOTE — Progress Notes (Signed)
Received stat order for lower extremity venous duplex. Spoke with Dr. Algis Liming, will be performed 3/16.  01/16/2020 5:12 PM Maudry Mayhew, MHA, RVT, RDCS, RDMS

## 2020-01-16 NOTE — Plan of Care (Signed)

## 2020-01-16 NOTE — Progress Notes (Signed)
Began second loading dose of Remdesavir at lower rate due to earlier response to dose. About 9 min into dose, patient again began c/o "tightness in chest like I can't catch my breath". VSS. Oxygen saturation on room air 99-100 %. Appears in no acute distress. Able to speak without being SOB. Dose stopped and will advise Md. Eulas Post, RN

## 2020-01-17 ENCOUNTER — Inpatient Hospital Stay (HOSPITAL_COMMUNITY): Payer: Medicaid Other

## 2020-01-17 DIAGNOSIS — C7951 Secondary malignant neoplasm of bone: Secondary | ICD-10-CM

## 2020-01-17 DIAGNOSIS — C801 Malignant (primary) neoplasm, unspecified: Secondary | ICD-10-CM

## 2020-01-17 DIAGNOSIS — R7989 Other specified abnormal findings of blood chemistry: Secondary | ICD-10-CM

## 2020-01-17 DIAGNOSIS — D500 Iron deficiency anemia secondary to blood loss (chronic): Secondary | ICD-10-CM

## 2020-01-17 DIAGNOSIS — C787 Secondary malignant neoplasm of liver and intrahepatic bile duct: Secondary | ICD-10-CM

## 2020-01-17 LAB — CBC WITH DIFFERENTIAL/PLATELET
Abs Immature Granulocytes: 0.24 10*3/uL — ABNORMAL HIGH (ref 0.00–0.07)
Basophils Absolute: 0 10*3/uL (ref 0.0–0.1)
Basophils Relative: 0 %
Eosinophils Absolute: 0 10*3/uL (ref 0.0–0.5)
Eosinophils Relative: 0 %
HCT: 23.1 % — ABNORMAL LOW (ref 39.0–52.0)
Hemoglobin: 6.9 g/dL — CL (ref 13.0–17.0)
Immature Granulocytes: 1 %
Lymphocytes Relative: 6 %
Lymphs Abs: 1.1 10*3/uL (ref 0.7–4.0)
MCH: 29.1 pg (ref 26.0–34.0)
MCHC: 29.9 g/dL — ABNORMAL LOW (ref 30.0–36.0)
MCV: 97.5 fL (ref 80.0–100.0)
Monocytes Absolute: 2.4 10*3/uL — ABNORMAL HIGH (ref 0.1–1.0)
Monocytes Relative: 14 %
Neutro Abs: 13.5 10*3/uL — ABNORMAL HIGH (ref 1.7–7.7)
Neutrophils Relative %: 79 %
Platelets: 116 10*3/uL — ABNORMAL LOW (ref 150–400)
RBC: 2.37 MIL/uL — ABNORMAL LOW (ref 4.22–5.81)
RDW: 17.9 % — ABNORMAL HIGH (ref 11.5–15.5)
WBC: 17.3 10*3/uL — ABNORMAL HIGH (ref 4.0–10.5)
nRBC: 0 % (ref 0.0–0.2)

## 2020-01-17 LAB — URINE CULTURE: Culture: NO GROWTH

## 2020-01-17 LAB — COMPREHENSIVE METABOLIC PANEL
ALT: 18 U/L (ref 0–44)
AST: 18 U/L (ref 15–41)
Albumin: 2.9 g/dL — ABNORMAL LOW (ref 3.5–5.0)
Alkaline Phosphatase: 92 U/L (ref 38–126)
Anion gap: 9 (ref 5–15)
BUN: 12 mg/dL (ref 6–20)
CO2: 22 mmol/L (ref 22–32)
Calcium: 8.2 mg/dL — ABNORMAL LOW (ref 8.9–10.3)
Chloride: 102 mmol/L (ref 98–111)
Creatinine, Ser: 0.93 mg/dL (ref 0.61–1.24)
GFR calc Af Amer: >60 mL/min (ref >60)
GFR calc non Af Amer: >60 mL/min (ref >60)
Glucose, Bld: 112 mg/dL — ABNORMAL HIGH (ref 70–99)
Potassium: 3.7 mmol/L (ref 3.5–5.1)
Sodium: 133 mmol/L — ABNORMAL LOW (ref 135–145)
Total Bilirubin: 1.1 mg/dL (ref 0.3–1.2)
Total Protein: 7.2 g/dL (ref 6.5–8.1)

## 2020-01-17 LAB — PHOSPHORUS: Phosphorus: 2.8 mg/dL (ref 2.5–4.6)

## 2020-01-17 LAB — NO BLOOD PRODUCTS

## 2020-01-17 LAB — FERRITIN: Ferritin: 126 ng/mL (ref 24–336)

## 2020-01-17 LAB — MAGNESIUM: Magnesium: 1.9 mg/dL (ref 1.7–2.4)

## 2020-01-17 LAB — C-REACTIVE PROTEIN: CRP: 14.5 mg/dL — ABNORMAL HIGH (ref <1.0)

## 2020-01-17 LAB — D-DIMER, QUANTITATIVE: D-Dimer, Quant: 5.38 ug/mL-FEU — ABNORMAL HIGH (ref 0.00–0.50)

## 2020-01-17 LAB — GLUCOSE, CAPILLARY: Glucose-Capillary: 86 mg/dL (ref 70–99)

## 2020-01-17 MED ORDER — FERROUS SULFATE 325 (65 FE) MG PO TABS
325.0000 mg | ORAL_TABLET | Freq: Two times a day (BID) | ORAL | Status: DC
  Administered 2020-01-17 – 2020-01-18 (×3): 325 mg via ORAL
  Filled 2020-01-17 (×3): qty 1

## 2020-01-17 MED ORDER — SODIUM CHLORIDE 0.9% FLUSH: 10.0000 mL | INTRAVENOUS | Status: DC | PRN

## 2020-01-17 MED ORDER — SENNOSIDES-DOCUSATE SODIUM 8.6-50 MG PO TABS: 2.0000 | ORAL_TABLET | Freq: Every evening | ORAL | Status: DC | PRN

## 2020-01-17 MED ORDER — SODIUM CHLORIDE 0.9% FLUSH: 10.0000 mL | Freq: Two times a day (BID) | INTRAVENOUS | Status: DC

## 2020-01-17 MED ORDER — SODIUM CHLORIDE 0.9% IV SOLUTION: Freq: Once | INTRAVENOUS | Status: DC

## 2020-01-17 MED ORDER — POLYETHYLENE GLYCOL 3350 17 G PO PACK: 17.0000 g | PACK | Freq: Every day | ORAL | Status: DC | PRN

## 2020-01-17 MED ORDER — IPRATROPIUM-ALBUTEROL 20-100 MCG/ACT IN AERS
1.0000 | INHALATION_SPRAY | Freq: Four times a day (QID) | RESPIRATORY_TRACT | Status: DC | PRN
  Filled 2020-01-17: qty 4

## 2020-01-17 NOTE — Progress Notes (Signed)
CRITICAL VALUE ALERT  Critical Value: hgb 6.9  Date & Time Notied: 01/17/20 0540  Provider Notified: Sharlet Salina, on-call hospitalist  Orders Received/Actions taken: Awaiting orders

## 2020-01-17 NOTE — Progress Notes (Signed)
Bilateral lower extremity venous duplex has been completed. Preliminary results can be found in CV Proc through chart review.   01/17/20 3:20 PM [name] RVT

## 2020-01-17 NOTE — Plan of Care (Signed)
  Problem: Education: Goal: Knowledge of General Education information will improve Description: Including pain rating scale, medication(s)/side effects and non-pharmacologic comfort measures Outcome: Progressing   Problem: Health Behavior/Discharge Planning: Goal: Ability to manage health-related needs will improve Outcome: Progressing   Problem: Elimination: Goal: Will not experience complications related to bowel motility Outcome: Progressing Goal: Will not experience complications related to urinary retention Outcome: Progressing   

## 2020-01-17 NOTE — Progress Notes (Signed)
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  Was recently in the ER for acute gout flare, completed prednisone course.   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  Not hypoxic therefore hold off on steroids, recently completed steroids outpatient without.  -Rocephin/azithromycin-day 3.  Procalcitonin level 0.47. -Remdesivir for 2 days, then developed intolerance.  Added this to the allergy list -CRP elevated, continue to monitor -D-dimer elevated.  Lovenox dosing twice daily -Leukocytosis somewhat appears to be chronic over the last several months.  Acute on chronic anemia/malignancy Iron deficiency anemia with low saturation History of metastatic high-grade neuroendocrine carcinoma with metastases -We will discharge on iron supplements with bowel regimen.  Avoiding IV iron given active infection -Follow-up outpatient oncology.  Last chemotherapy 2/17.  Planned repeat CT abdomen pelvis in April -Patient refusing blood transfusion, hemoglobin 6.9. -Supportive care.  Gout -Completed wheezing course of prednisone due to flare.  Essential hypertension -Not on home medication.  Diet controlled.   DVT prophylaxis: Lovenox Code Status: Full Family Communication: None Disposition Plan:   Patient From= home  Patient Anticipated D/C place=  Home  Barriers= maintain hospital's atrial exertional dyspnea which is multifactorial in nature from COVID-19 infection and iron deficiency anemia.  Hopefully discharge over next 24-48 hours.  Also inflammatory markers are trending upwards.   Subjective: Yesterday after receiving second dose of remdesivir patient started having some chest discomfort and tightness.  Rate was slowed down and he continued to feel this way.  After stopping it his symptoms significantly improved.  He has never had such allergic reaction in the past.  This morning he still having some exertional dyspnea.  Denies any obvious signs of blood loss.  Hemoglobin is 6.9 but patient has refused any blood transfusion, refusal paperwork has been signed.  Review of Systems Otherwise negative except as per HPI, including: General: Denies fever, chills, night sweats or unintended weight loss. Resp: Denies hemoptysis Cardiac: Denies chest pain, palpitations, orthopnea, paroxysmal nocturnal dyspnea. GI: Denies abdominal pain, nausea, vomiting, diarrhea or constipation GU: Denies dysuria, frequency, hesitancy or incontinence MS: Denies muscle aches, joint pain or swelling Neuro: Denies headache, neurologic deficits (focal weakness, numbness, tingling), abnormal gait Psych: Denies anxiety, depression, SI/HI/AVH Skin: Denies new rashes or lesions ID: Denies sick contacts, exotic exposures, travel  Examination:  General exam: Overall he feels comfortable, not in any acute distress. Respiratory system: Bilateral rhonchorous breath sounds at bases. Cardiovascular system: S1 & S2 heard, RRR. No JVD, murmurs, rubs, gallops or clicks. No pedal edema. Gastrointestinal system: Abdomen is nondistended, soft and nontender. No organomegaly or masses felt. Normal bowel sounds heard. Central nervous system: Alert and oriented. No focal neurological deficits. Extremities: Symmetric 5 x 5 power. Skin: No rashes, lesions or  ulcers Psychiatry: Judgement and insight appear normal. Mood & affect appropriate.     Objective: Vitals:   01/16/20 2119 01/17/20 0226 01/17/20 0415 01/17/20 0637  BP: (!) 154/81 Marland Kitchen)  161/85  (!) 143/71  Pulse: 81 79  70  Resp: 18 20  20   Temp: 99.5 F (37.5 C) (!) 100.4 F (38 C) 98.6 F (37 C) 98.7 F (37.1 C)  TempSrc: Oral Oral Oral Oral  SpO2: 98% 100%  100%  Weight:      Height:        Intake/Output Summary (Last 24 hours) at 01/17/2020 0835 Last data filed at 01/16/2020 1742 Gross per 24 hour  Intake 400 ml  Output --  Net 400 ml   Filed Weights   01/15/20 1524  Weight: 72.6 kg     Data Reviewed:   CBC: Recent Labs  Lab 01/15/20 1542 01/16/20 0405 01/17/20 0459  WBC 16.6* 16.4* 17.3*  NEUTROABS 12.7*  --  13.5*  HGB 7.5* 7.2* 6.9*  HCT 25.0* 23.9* 23.1*  MCV 97.3 97.6 97.5  PLT 113* 103* 706*   Basic Metabolic Panel: Recent Labs  Lab 01/15/20 1542 01/16/20 0405 01/17/20 0459  NA 132* 132* 133*  K 3.9 3.9 3.7  CL 99 100 102  CO2 23 24 22   GLUCOSE 145* 113* 112*  BUN 16 14 12   CREATININE 0.94 1.01 0.93  CALCIUM 8.3* 8.1* 8.2*  MG  --   --  1.9  PHOS  --   --  2.8   GFR: Estimated Creatinine Clearance: 80 mL/min (by C-G formula based on SCr of 0.93 mg/dL). Liver Function Tests: Recent Labs  Lab 01/15/20 1542 01/17/20 0459  AST 21 18  ALT 22 18  ALKPHOS 118 92  BILITOT 1.3* 1.1  PROT 7.6 7.2  ALBUMIN 3.2* 2.9*   No results for input(s): LIPASE, AMYLASE in the last 168 hours. No results for input(s): AMMONIA in the last 168 hours. Coagulation Profile: Recent Labs  Lab 01/15/20 1542  INR 1.2   Cardiac Enzymes: No results for input(s): CKTOTAL, CKMB, CKMBINDEX, TROPONINI in the last 168 hours. BNP (last 3 results) No results for input(s): PROBNP in the last 8760 hours. HbA1C: No results for input(s): HGBA1C in the last 72 hours. CBG: Recent Labs  Lab 01/16/20 0831  GLUCAP 101*   Lipid Profile: No results for input(s):  CHOL, HDL, LDLCALC, TRIG, CHOLHDL, LDLDIRECT in the last 72 hours. Thyroid Function Tests: No results for input(s): TSH, T4TOTAL, FREET4, T3FREE, THYROIDAB in the last 72 hours. Anemia Panel: Recent Labs    01/16/20 0921 01/16/20 1709 01/17/20 0459  VITAMINB12  --  1,215*  --   FOLATE  --  14.9  --   FERRITIN 110  --  126  TIBC  --  265  --   IRON  --  16*  --   RETICCTPCT  --  4.6*  --    Sepsis Labs: Recent Labs  Lab 01/15/20 1542 01/16/20 0921  PROCALCITON  --  0.47  LATICACIDVEN 1.3  --     Recent Results (from the past 240 hour(s))  Blood Culture (routine x 2)     Status: None (Preliminary result)   Collection Time: 01/15/20  3:42 PM   Specimen: BLOOD  Result Value Ref Range Status   Specimen Description   Final    BLOOD PORTA CATH Performed at [facility] [address] [place], [place] [address]    Special Requests   Final    BOTTLES DRAWN AEROBIC AND ANAEROBIC Blood Culture adequate volume Performed at [facility] [address] [place], [place] [address]    Culture   Final  NO GROWTH 2 DAYS Performed at [facility], [place] [address] [place], [place] [address]    Report Status PENDING  Incomplete  Blood Culture (routine x 2)     Status: None (Preliminary result)   Collection Time: 01/15/20  3:47 PM   Specimen: BLOOD  Result Value Ref Range Status   Specimen Description   Final    BLOOD LEFT ARM Performed at [facility] [address], [place], [place] [address]    Special Requests   Final    BOTTLES DRAWN AEROBIC AND ANAEROBIC Blood Culture adequate volume Performed at [place] [address], [place], [place] [address]    Culture   Final    NO GROWTH 2 DAYS Performed at [facility] [address], [place], [place] [address]    Report Status PENDING  Incomplete  SARS CORONAVIRUS 2 (TAT 6-24 HRS) Nasopharyngeal Nasopharyngeal Swab     Status:  Abnormal   Collection Time: 01/15/20  5:49 PM   Specimen: Nasopharyngeal Swab  Result Value Ref Range Status   SARS Coronavirus 2 POSITIVE (A) NEGATIVE Final    Comment: RESULT CALLED TO, READ BACK BY AND VERIFIED WITH: J. TALKINGTON,CHARGE RN [id number] 01/16/2020 T. TYSOR (NOTE) SARS-CoV-2 target nucleic acids are DETECTED. The SARS-CoV-2 RNA is generally detectable in upper and lower respiratory specimens during the acute phase of infection. Positive results are indicative of the presence of SARS-CoV-2 RNA. Clinical correlation with patient history and other diagnostic information is  necessary to determine patient infection status. Positive results do not rule out bacterial infection or co-infection with other viruses.  The expected result is Negative. Fact Sheet for Patients: SugarRoll.be Fact Sheet for Healthcare Providers: https://www.woods-mathews.com/ This test is not yet approved or cleared by the [place] FDA and  has been authorized for detection and/or diagnosis of SARS-CoV-2 by FDA under an Emergency Use Authorization (EUA). This EUA will remain  in effect (meaning this test can be  used) for the duration of the COVID-19 declaration under Section 564(b)(1) of the Act, 21 U.S.C. section 360bbb-3(b)(1), unless the authorization is terminated or revoked sooner. Performed at [facility], [place] [address] [place], [place] [address]   Culture, Urine     Status: None   Collection Time: 01/15/20  8:54 PM   Specimen: Urine, Random  Result Value Ref Range Status   Specimen Description   Final    URINE, RANDOM Performed at [facility] [address] [place], [place] [address]    Special Requests   Final    NONE Performed at [facility], [place] [address], [place], [place] [address]    Culture   Final    NO GROWTH Performed at [facility], [place] [address], [place],   [address]    Report Status 01/17/2020 FINAL  Final         Radiology Studies: CT Abdomen Pelvis W Contrast  Result Date: 01/15/2020 CLINICAL DATA:  Fever and back pain. EXAM: CT ABDOMEN AND PELVIS WITH CONTRAST TECHNIQUE: Multidetector CT imaging of the abdomen and pelvis was performed using the standard protocol following bolus administration of intravenous contrast. CONTRAST:  179mL OMNIPAQUE IOHEXOL 300 MG/ML  SOLN COMPARISON:  December 12, 2019 FINDINGS: Lower chest: Mild atelectasis and/or infiltrate is seen within the posterior aspect of the right lung base. Hepatobiliary: Numerous heterogeneous liver lesions are seen scattered throughout a cirrhotic liver. This is stable in appearance when  compared to the prior study. No gallstones or gallbladder wall thickening. Mild central hepatic biliary dilatation is noted. Pancreas: A stable 1.9 cm low-attenuation lesion is seen within the pancreatic tail. Spleen: There is mild, stable splenomegaly. Multiple small calcified granulomas are seen within the spleen. Tortuous vessels are noted along the splenic hilum. Adrenals/Urinary Tract: Adrenal glands are unremarkable. Kidneys are normal, without renal calculi, focal lesion, or hydronephrosis. The hyperattenuating lesion described within the upper pole of the right kidney on the prior study is not clearly visualized on the current exam. Bladder is unremarkable. Stomach/Bowel: Stomach is within normal limits. Appendix appears normal. No evidence of bowel wall thickening, distention, or inflammatory changes. Vascular/Lymphatic: Moderate severity aortic atherosclerosis. No enlarged abdominal or pelvic lymph nodes. Reproductive: Prostate is unremarkable. Other: A very small amount of pelvic free fluid is noted. Musculoskeletal: Stable heterogeneous sclerotic areas are again seen within the spine, pelvis and proximal femurs. IMPRESSION: 1. Mild atelectasis and/or infiltrate within the posterior aspect of the right  lung base. 2. Cirrhotic liver with evidence of portal hypertension and diffuse hepatic metastasis. 3. Stable 1.9 cm low-attenuation lesion within the pancreatic tail. 4. Stable heterogeneous sclerotic areas within the spine, pelvis and proximal femurs. 5. Very small amount of pelvic free fluid. Aortic Atherosclerosis (ICD10-I70.0). Electronically Signed   By: Virgina Norfolk M.D.   On: 01/15/2020 18:30   DG Chest Port 1 View  Result Date: 01/15/2020 CLINICAL DATA:  Fever, right-sided back pain EXAM: PORTABLE CHEST 1 VIEW COMPARISON:  09/26/2019 FINDINGS: The heart size and mediastinal contours are within normal limits. Right chest port catheter. Low volume AP portable examination with heterogeneous airspace opacity of the right lung base. The visualized skeletal structures are unremarkable. IMPRESSION: Low volume AP portable examination with heterogeneous airspace opacity of the right lung base, which may reflect atelectasis or developing airspace disease. PA and lateral radiographs may be helpful to further evaluate. Electronically Signed   By: Eddie Candle M.D.   On: 01/15/2020 15:58        Scheduled Meds: . sodium chloride   Intravenous Once  . vitamin C  500 mg Oral Daily  . azithromycin  250 mg Oral Daily  . Chlorhexidine Gluconate Cloth  6 each Topical Daily  . enoxaparin (LOVENOX) injection  40 mg Subcutaneous BID  . lidocaine-prilocaine   Topical Once  . sodium chloride flush  10-40 mL Intracatheter Q12H  . zinc sulfate  220 mg Oral Daily   Continuous Infusions: . cefTRIAXone (ROCEPHIN)  IV 1 g (01/16/20 1742)     LOS: 2 days   Time spent= 35 mins    Locke Barrell Arsenio Loader, MD [facility]  If 7PM-7AM, please contact night-coverage  01/17/2020, 8:35 AM

## 2020-01-17 NOTE — Progress Notes (Signed)
Patients hgb 6.9 on morning labs, orders received to transfuse 1U PRBC's. RN went to talk to patient about results and obtain consent. RN explained risks and benefits of blood transfusion. Patient states he has never had a blood transfusion and does not want to have a reaction like he did to the remdesivir. RN educated patient on risks and benefits again. Patient refusing blood transfusion, signed blood refusal form. On-call hospitalist [place] as well as blood bank notified.

## 2020-01-18 DIAGNOSIS — R0789 Other chest pain: Secondary | ICD-10-CM

## 2020-01-18 DIAGNOSIS — D649 Anemia, unspecified: Secondary | ICD-10-CM

## 2020-01-18 LAB — CBC WITH DIFFERENTIAL/PLATELET
Abs Immature Granulocytes: 0.16 10*3/uL — ABNORMAL HIGH (ref 0.00–0.07)
Basophils Absolute: 0 10*3/uL (ref 0.0–0.1)
Basophils Relative: 0 %
Eosinophils Absolute: 0.1 10*3/uL (ref 0.0–0.5)
Eosinophils Relative: 1 %
HCT: 23.1 % — ABNORMAL LOW (ref 39.0–52.0)
Hemoglobin: 6.9 g/dL — CL (ref 13.0–17.0)
Immature Granulocytes: 1 %
Lymphocytes Relative: 8 %
Lymphs Abs: 1.2 10*3/uL (ref 0.7–4.0)
MCH: 28.9 pg (ref 26.0–34.0)
MCHC: 29.9 g/dL — ABNORMAL LOW (ref 30.0–36.0)
MCV: 96.7 fL (ref 80.0–100.0)
Monocytes Absolute: 2.4 10*3/uL — ABNORMAL HIGH (ref 0.1–1.0)
Monocytes Relative: 16 %
Neutro Abs: 11.4 10*3/uL — ABNORMAL HIGH (ref 1.7–7.7)
Neutrophils Relative %: 74 %
Platelets: 132 10*3/uL — ABNORMAL LOW (ref 150–400)
RBC: 2.39 MIL/uL — ABNORMAL LOW (ref 4.22–5.81)
RDW: 17.2 % — ABNORMAL HIGH (ref 11.5–15.5)
WBC: 15.2 10*3/uL — ABNORMAL HIGH (ref 4.0–10.5)
nRBC: 0 % (ref 0.0–0.2)

## 2020-01-18 LAB — COMPREHENSIVE METABOLIC PANEL
ALT: 16 U/L (ref 0–44)
AST: 16 U/L (ref 15–41)
Albumin: 2.8 g/dL — ABNORMAL LOW (ref 3.5–5.0)
Alkaline Phosphatase: 91 U/L (ref 38–126)
Anion gap: 8 (ref 5–15)
BUN: 12 mg/dL (ref 6–20)
CO2: 22 mmol/L (ref 22–32)
Calcium: 8.3 mg/dL — ABNORMAL LOW (ref 8.9–10.3)
Chloride: 103 mmol/L (ref 98–111)
Creatinine, Ser: 0.9 mg/dL (ref 0.61–1.24)
GFR calc Af Amer: >60 mL/min (ref >60)
GFR calc non Af Amer: >60 mL/min (ref >60)
Glucose, Bld: 100 mg/dL — ABNORMAL HIGH (ref 70–99)
Potassium: 3.9 mmol/L (ref 3.5–5.1)
Sodium: 133 mmol/L — ABNORMAL LOW (ref 135–145)
Total Bilirubin: 1 mg/dL (ref 0.3–1.2)
Total Protein: 7.2 g/dL (ref 6.5–8.1)

## 2020-01-18 LAB — FERRITIN: Ferritin: 123 ng/mL (ref 24–336)

## 2020-01-18 LAB — C-REACTIVE PROTEIN: CRP: 12.1 mg/dL — ABNORMAL HIGH (ref <1.0)

## 2020-01-18 LAB — PREPARE RBC (CROSSMATCH)

## 2020-01-18 LAB — GLUCOSE, CAPILLARY: Glucose-Capillary: 95 mg/dL (ref 70–99)

## 2020-01-18 LAB — MAGNESIUM: Magnesium: 2 mg/dL (ref 1.7–2.4)

## 2020-01-18 LAB — PHOSPHORUS: Phosphorus: 2.5 mg/dL (ref 2.5–4.6)

## 2020-01-18 LAB — D-DIMER, QUANTITATIVE: D-Dimer, Quant: 4.13 ug/mL-FEU — ABNORMAL HIGH (ref 0.00–0.50)

## 2020-01-18 MED ORDER — SODIUM CHLORIDE 0.9% IV SOLUTION: Freq: Once | INTRAVENOUS | Status: AC

## 2020-01-18 MED ORDER — ENOXAPARIN SODIUM 40 MG/0.4ML ~~LOC~~ SOLN
40.0000 mg | SUBCUTANEOUS | Status: DC
  Administered 2020-01-18: 40 mg via SUBCUTANEOUS
  Filled 2020-01-18: qty 0.4

## 2020-01-18 MED ORDER — FERROUS SULFATE 325 (65 FE) MG PO TABS
325.0000 mg | ORAL_TABLET | Freq: Two times a day (BID) | ORAL | Status: DC
  Administered 2020-01-18 – 2020-01-19 (×2): 325 mg via ORAL
  Filled 2020-01-18 (×2): qty 1

## 2020-01-18 MED ORDER — CARVEDILOL 6.25 MG PO TABS
6.2500 mg | ORAL_TABLET | Freq: Two times a day (BID) | ORAL | Status: DC
  Administered 2020-01-18 – 2020-01-19 (×3): 6.25 mg via ORAL
  Filled 2020-01-18 (×3): qty 1

## 2020-01-18 MED ORDER — SODIUM CHLORIDE 0.9 % IV SOLN: INTRAVENOUS | Status: DC | PRN

## 2020-01-18 NOTE — Progress Notes (Signed)
ANTICOAGULATION CONSULT NOTE - Follow Up Consult  Pharmacy Consult for lovenox Indication: VTE prophylaxis  Allergies  Allergen Reactions  . Remdesivir Other (See Comments)    Chest tightness    Patient Measurements: Height: 5\' 7"  (170.2 cm) Weight: 160 lb (72.6 kg) IBW/kg (Calculated) : 66.1 Heparin Dosing Weight:   Vital Signs: Temp: 100 F (37.8 C) (03/17 0942) Temp Source: Oral (03/17 0942) BP: 149/71 (03/17 0942) Pulse Rate: 76 (03/17 0942)  Labs: Recent Labs    01/15/20 1542 01/15/20 1542 01/16/20 0405 01/16/20 0405 01/17/20 0459 01/18/20 0352  HGB 7.5*   < > 7.2*   < > 6.9* 6.9*  HCT 25.0*   < > 23.9*  --  23.1* 23.1*  PLT 113*   < > 103*  --  116* 132*  APTT 40*  --   --   --   --   --   LABPROT 15.2  --   --   --   --   --   INR 1.2  --   --   --   --   --   CREATININE 0.94   < > 1.01  --  0.93 0.90   < > = values in this interval not displayed.    Estimated Creatinine Clearance: 82.6 mL/min (by C-G formula based on SCr of 0.9 mg/dL).   Assessment: Patient is a 59 y.o M with hx mets small cell carcinoma to liver on chemo PTA (had chemo on 2/17, pegfilgastrim on 3/19) presented to the ED on 3/14 with c/o fever and back pain.  He was found to be COVID+.  He's currently on lovenox for VTE px  Today, 01/18/2020: - Ddimer down 4.13 - hgb 6.9 (pt's refusing blood transfusion), plts low but stable) - no bleeding documented -  3/16 LE doppler: neg for VTE   Plan:  - with D-dimer now <5 and anemia, will adjust lovenox to 40 mg SQ daily - monitor for s/s bleeding, thrombosis  Jackqueline Aquilar P 01/18/2020,11:30 AM

## 2020-01-18 NOTE — Progress Notes (Signed)
PROGRESS NOTE    [name]    Code Status: Full Code  DUK:[id number] DOB: 1961/08/26 DOA: 01/15/2020 LOS: 3 days  PCP: Patient, No Pcp Per CC:  Chief Complaint  Patient presents with  . Fever  . 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  Was recently in the ER for acute gout flare, completed prednisone course.  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 Sepsis secondary to COVID-19 infection /superimposed CAP of RLL a. Elevated inflammatory markers b. Receive remdesivir x2 days then developed intolerance c. Day 3/5 ceftriaxone/azithromycin d. Tolerating room air and no need for steroids e. D-dimer elevated and has been on twice daily Lovenox however with Hb 6.9, monitor for bleed 2. Atypical chest discomfort a. Episode this a.m. while resting however in setting of anemia.  Reportedly resolved with a.m. dose of ferrous sulfate? b. Continue telemetry 3. Acute on chronic normocytic anemia /any malignancy /iron deficiency anemia  a. Hb 6.9, chest discomfort may be symptom however tolerating room air b. Initially refusing blood transfusion however discussion today patient agreed to go ahead with PRBC transfusion c. 1 unit PRBC d. Iron supplement at discharge 4. 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 Follows with Dr. Irene Limbo 5. Gout a. Without active flare, recently completed prednisone course due to recent flare 6. Hypertension a. Start Coreg twice daily  DVT prophylaxis: Lovenox Family Communication: None Disposition  Plan:   Patient came from:   Home                                                                                          Anticipated d/c place: Home  Barriers to d/c: Anemia.  Hopeful discharge in next 24 hours pending hemoglobin stability  Pressure injury documentation    None  Consultants  None  Procedures  None  Antibiotics   Anti-infectives (From admission, onward)   Start     Dose/Rate Route Frequency Ordered Stop   01/17/20 1000  remdesivir 100 mg in sodium chloride 0.9 % 100 mL IVPB  Status:  Discontinued     100 mg 200 mL/hr over 30 Minutes Intravenous Daily 01/16/20 0937 01/16/20 1803   01/16/20 1600  cefTRIAXone (ROCEPHIN) 1 g in sodium chloride 0.9 % 100 mL IVPB     1 g 200 mL/hr over 30 Minutes Intravenous Every 24 hours 01/16/20 0002     01/16/20 1000  azithromycin (ZITHROMAX) tablet 250 mg     250 mg Oral Daily 01/16/20 0002     01/16/20 1000  remdesivir 100 mg in sodium chloride 0.9 % 100 mL IVPB     100 mg 200 mL/hr over 30 Minutes Intravenous  Once  01/16/20 9629 01/16/20 1532   01/16/20 1000  remdesivir 100 mg in sodium chloride 0.9 % 100 mL IVPB     100 mg 200 mL/hr over 30 Minutes Intravenous  Once 01/16/20 0937 01/16/20 1732   01/15/20 1615  cefTRIAXone (ROCEPHIN) 1 g in sodium chloride 0.9 % 100 mL IVPB     1 g 200 mL/hr over 30 Minutes Intravenous  Once 01/15/20 1610 01/15/20 1828   01/15/20 1615  azithromycin (ZITHROMAX) tablet 500 mg     500 mg Oral  Once 01/15/20 1610 01/15/20 1624        Subjective   Patient seen and examined at bedside in no acute distress and resting comfortably. No acute events overnight.  Ambulating. Tolerating diet well.  Admits to an episode of chest discomfort described as pressure-like this a.m. while seated which came on suddenly and resolved with iron supplement.  No associated symptoms.  Otherwise no complaints  Objective   Vitals:   01/18/20 0415 01/18/20 0639 01/18/20 0942 01/18/20 1417  BP:  (!) 147/74 (!)  149/71 (!) 157/81  Pulse:  75 76 81  Resp:  20 16 18   Temp: 98.9 F (37.2 C) 99.1 F (37.3 C) 100 F (37.8 C) 98.1 F (36.7 C)  TempSrc: Oral Oral Oral Oral  SpO2:  100% 100% 100%  Weight:      Height:        Intake/Output Summary (Last 24 hours) at 01/18/2020 1705 Last data filed at 01/18/2020 0900 Gross per 24 hour  Intake 320 ml  Output --  Net 320 ml   Filed Weights   01/15/20 1524  Weight: 72.6 kg    Examination:  Physical Exam Vitals and nursing note reviewed.  Constitutional:      Appearance: Normal appearance.  HENT:     Head: Normocephalic and atraumatic.  Eyes:     Conjunctiva/sclera: Conjunctivae normal.  Cardiovascular:     Rate and Rhythm: Normal rate and regular rhythm.  Pulmonary:     Effort: Pulmonary effort is normal.     Breath sounds: Normal breath sounds.  Abdominal:     General: Abdomen is flat.     Palpations: Abdomen is soft.  Musculoskeletal:        General: No swelling or tenderness.  Skin:    Coloration: Skin is not jaundiced or pale.  Neurological:     Mental Status: He is alert. Mental status is at baseline.  Psychiatric:        Mood and Affect: Mood normal.        Behavior: Behavior normal.     Data Reviewed: I have personally reviewed following labs and imaging studies  CBC: Recent Labs  Lab 01/15/20 1542 01/16/20 0405 01/17/20 0459 01/18/20 0352  WBC 16.6* 16.4* 17.3* 15.2*  NEUTROABS 12.7*  --  13.5* 11.4*  HGB 7.5* 7.2* 6.9* 6.9*  HCT 25.0* 23.9* 23.1* 23.1*  MCV 97.3 97.6 97.5 96.7  PLT 113* 103* 116* 528*   Basic Metabolic Panel: Recent Labs  Lab 01/15/20 1542 01/16/20 0405 01/17/20 0459 01/18/20 0352  NA 132* 132* 133* 133*  K 3.9 3.9 3.7 3.9  CL 99 100 102 103  CO2 23 24 22 22   GLUCOSE 145* 113* 112* 100*  BUN 16 14 12 12   CREATININE 0.94 1.01 0.93 0.90  CALCIUM 8.3* 8.1* 8.2* 8.3*  MG  --   --  1.9 2.0  PHOS  --   --  2.8 2.5   GFR: Estimated Creatinine Clearance:  82.6 mL/min (by C-G formula  based on SCr of 0.9 mg/dL). Liver Function Tests: Recent Labs  Lab 01/15/20 1542 01/17/20 0459 01/18/20 0352  AST 21 18 16   ALT 22 18 16   ALKPHOS 118 92 91  BILITOT 1.3* 1.1 1.0  PROT 7.6 7.2 7.2  ALBUMIN 3.2* 2.9* 2.8*   No results for input(s): LIPASE, AMYLASE in the last 168 hours. No results for input(s): AMMONIA in the last 168 hours. Coagulation Profile: Recent Labs  Lab 01/15/20 1542  INR 1.2   Cardiac Enzymes: No results for input(s): CKTOTAL, CKMB, CKMBINDEX, TROPONINI in the last 168 hours. BNP (last 3 results) No results for input(s): PROBNP in the last 8760 hours. HbA1C: No results for input(s): HGBA1C in the last 72 hours. CBG: Recent Labs  Lab 01/16/20 0831 01/17/20 0856 01/18/20 0944  GLUCAP 101* 86 95   Lipid Profile: No results for input(s): CHOL, HDL, LDLCALC, TRIG, CHOLHDL, LDLDIRECT in the last 72 hours. Thyroid Function Tests: No results for input(s): TSH, T4TOTAL, FREET4, T3FREE, THYROIDAB in the last 72 hours. Anemia Panel: Recent Labs    01/16/20 0921 01/16/20 1709 01/17/20 0459 01/18/20 0352  VITAMINB12  --  1,215*  --   --   FOLATE  --  14.9  --   --   FERRITIN   < >  --  126 123  TIBC  --  265  --   --   IRON  --  16*  --   --   RETICCTPCT  --  4.6*  --   --    < > = values in this interval not displayed.   Sepsis Labs: Recent Labs  Lab 01/15/20 1542 01/16/20 0921  PROCALCITON  --  0.47  LATICACIDVEN 1.3  --     Recent Results (from the past 240 hour(s))  Blood Culture (routine x 2)     Status: None (Preliminary result)   Collection Time: 01/15/20  3:42 PM   Specimen: BLOOD  Result Value Ref Range Status   Specimen Description   Final    BLOOD PORTA CATH Performed at [facility] [address] [place], [place] [address]    Special Requests   Final    BOTTLES DRAWN AEROBIC AND ANAEROBIC Blood Culture adequate volume Performed at [facility] [address], [place],  [place] [address]    Culture   Final    NO GROWTH 3 DAYS Performed at [facility], [place] [address] [place], [place] [address]    Report Status PENDING  Incomplete  Blood Culture (routine x 2)     Status: None (Preliminary result)   Collection Time: 01/15/20  3:47 PM   Specimen: BLOOD  Result Value Ref Range Status   Specimen Description   Final    BLOOD LEFT ARM Performed at [facility] [address] [place], [place] [address]    Special Requests   Final    BOTTLES DRAWN AEROBIC AND ANAEROBIC Blood Culture adequate volume Performed at [facility] [address] [place], [place] [address]    Culture   Final    NO GROWTH 3 DAYS Performed at [facility], [place] [address] [place], [place] [address]    Report Status PENDING  Incomplete  SARS CORONAVIRUS 2 (TAT 6-24 HRS) Nasopharyngeal Nasopharyngeal Swab     Status: Abnormal   Collection Time: 01/15/20  5:49 PM   Specimen: Nasopharyngeal Swab  Result Value Ref  Range Status   SARS Coronavirus 2 POSITIVE (A) NEGATIVE Final    Comment: RESULT CALLED TO, READ BACK BY AND VERIFIED WITH: J. TALKINGTON,CHARGE RN [id number] 01/16/2020 T. TYSOR (NOTE) SARS-CoV-2 target nucleic acids are DETECTED. The SARS-CoV-2 RNA is generally detectable in upper and lower respiratory specimens during the acute phase of infection. Positive results are indicative of the presence of SARS-CoV-2 RNA. Clinical correlation with patient history and other diagnostic information is  necessary to determine patient infection status. Positive results do not rule out bacterial infection or co-infection with other viruses.  The expected result is Negative. Fact Sheet for Patients: SugarRoll.be Fact Sheet for Healthcare Providers: https://www.woods-mathews.com/ This test is not yet approved or cleared by the [place] FDA and  has been authorized for detection and/or diagnosis of  SARS-CoV-2 by FDA under an Emergency Use Authorization (EUA). This EUA will remain  in effect (meaning this test can be  used) for the duration of the COVID-19 declaration under Section 564(b)(1) of the Act, 21 U.S.C. section 360bbb-3(b)(1), unless the authorization is terminated or revoked sooner. Performed at [facility], [place] [address] [place] [address] [place], [place] [address]   Culture, Urine     Status: None   Collection Time: 01/15/20  8:54 PM   Specimen: Urine, Random  Result Value Ref Range Status   Specimen Description   Final    URINE, RANDOM Performed at [facility] [address] [place], [place] [address]    Special Requests   Final    NONE Performed at [facility], [place] [address], [place], [place] [address]    Culture   Final    NO GROWTH Performed at [facility], [place] [address], [place], [place] [address]    Report Status 01/17/2020 FINAL  Final         Radiology Studies: VAS [place] LOWER EXTREMITY VENOUS (DVT)  Result Date: 01/17/2020  Lower Venous DVTStudy Indications: Elevated Ddimer.  Risk Factors: COVID 19 positive. Performing Technologist: Oliver Hum RVT  Examination Guidelines: A complete evaluation includes B-mode imaging, spectral Doppler, color Doppler, and power Doppler as needed of all accessible portions of each vessel. Bilateral testing is considered an integral part of a complete examination. Limited examinations for reoccurring indications may be performed as noted. The reflux portion of the exam is performed with the patient in reverse Trendelenburg.  +---------+---------------+---------+-----------+----------+--------------+ RIGHT    CompressibilityPhasicitySpontaneityPropertiesThrombus Aging +---------+---------------+---------+-----------+----------+--------------+ CFV      Full           Yes      Yes                                  +---------+---------------+---------+-----------+----------+--------------+ SFJ      Full                                                        +---------+---------------+---------+-----------+----------+--------------+ FV Prox  Full                                                        +---------+---------------+---------+-----------+----------+--------------+ FV  Mid   Full                                                        +---------+---------------+---------+-----------+----------+--------------+ FV DistalFull                                                        +---------+---------------+---------+-----------+----------+--------------+ PFV      Full                                                        +---------+---------------+---------+-----------+----------+--------------+ POP      Full           Yes      Yes                                 +---------+---------------+---------+-----------+----------+--------------+ PTV      Full                                                        +---------+---------------+---------+-----------+----------+--------------+ PERO     Full                                                        +---------+---------------+---------+-----------+----------+--------------+   +---------+---------------+---------+-----------+----------+--------------+ LEFT     CompressibilityPhasicitySpontaneityPropertiesThrombus Aging +---------+---------------+---------+-----------+----------+--------------+ CFV      Full           Yes      Yes                                 +---------+---------------+---------+-----------+----------+--------------+ SFJ      Full                                                        +---------+---------------+---------+-----------+----------+--------------+ FV Prox  Full                                                         +---------+---------------+---------+-----------+----------+--------------+ FV Mid   Full                                                        +---------+---------------+---------+-----------+----------+--------------+  FV DistalFull                                                        +---------+---------------+---------+-----------+----------+--------------+ PFV      Full                                                        +---------+---------------+---------+-----------+----------+--------------+ POP      Full           Yes      Yes                                 +---------+---------------+---------+-----------+----------+--------------+ PTV      Full                                                        +---------+---------------+---------+-----------+----------+--------------+ PERO     Full                                                        +---------+---------------+---------+-----------+----------+--------------+     Summary: RIGHT: - There is no evidence of deep vein thrombosis in the lower extremity.  - No cystic structure found in the popliteal fossa.  LEFT: - There is no evidence of deep vein thrombosis in the lower extremity.  - No cystic structure found in the popliteal fossa.  *See table(s) above for measurements and observations. Electronically signed by Harold Barban MD on 01/17/2020 at 9:42:37 PM.    Final         Scheduled Meds: . sodium chloride   Intravenous Once  . sodium chloride   Intravenous Once  . vitamin C  500 mg Oral Daily  . azithromycin  250 mg Oral Daily  . Chlorhexidine Gluconate Cloth  6 each Topical Daily  . enoxaparin (LOVENOX) injection  40 mg Subcutaneous Q24H  . ferrous sulfate  325 mg Oral BID  . lidocaine-prilocaine   Topical Once  . sodium chloride flush  10-40 mL Intracatheter Q12H  . zinc sulfate  220 mg Oral Daily   Continuous Infusions: . sodium chloride    . cefTRIAXone (ROCEPHIN)  IV 1 g  (01/18/20 1551)     Time spent: 26 minutes with over 50% of the time coordinating the patient's care    Harold Hedge, DO [facility] Pager [phone]  Call night coverage person covering after 7pm

## 2020-01-19 LAB — BPAM RBC
Blood Product Expiration Date: 202104152359
ISSUE DATE / TIME: 202103171732
Unit Type and Rh: 6200

## 2020-01-19 LAB — CBC WITH DIFFERENTIAL/PLATELET
Abs Immature Granulocytes: 0.1 10*3/uL — ABNORMAL HIGH (ref 0.00–0.07)
Basophils Absolute: 0.1 10*3/uL (ref 0.0–0.1)
Basophils Relative: 0 %
Eosinophils Absolute: 0.1 10*3/uL (ref 0.0–0.5)
Eosinophils Relative: 1 %
HCT: 25.6 % — ABNORMAL LOW (ref 39.0–52.0)
Hemoglobin: 7.9 g/dL — ABNORMAL LOW (ref 13.0–17.0)
Immature Granulocytes: 1 %
Lymphocytes Relative: 8 %
Lymphs Abs: 1.2 10*3/uL (ref 0.7–4.0)
MCH: 29.7 pg (ref 26.0–34.0)
MCHC: 30.9 g/dL (ref 30.0–36.0)
MCV: 96.2 fL (ref 80.0–100.0)
Monocytes Absolute: 2 10*3/uL — ABNORMAL HIGH (ref 0.1–1.0)
Monocytes Relative: 14 %
Neutro Abs: 10.7 10*3/uL — ABNORMAL HIGH (ref 1.7–7.7)
Neutrophils Relative %: 76 %
Platelets: 151 10*3/uL (ref 150–400)
RBC: 2.66 MIL/uL — ABNORMAL LOW (ref 4.22–5.81)
RDW: 16.5 % — ABNORMAL HIGH (ref 11.5–15.5)
WBC: 14.1 10*3/uL — ABNORMAL HIGH (ref 4.0–10.5)
nRBC: 0 % (ref 0.0–0.2)

## 2020-01-19 LAB — C-REACTIVE PROTEIN: CRP: 11.1 mg/dL — ABNORMAL HIGH (ref <1.0)

## 2020-01-19 LAB — BASIC METABOLIC PANEL
Anion gap: 8 (ref 5–15)
BUN: 12 mg/dL (ref 6–20)
CO2: 20 mmol/L — ABNORMAL LOW (ref 22–32)
Calcium: 8.1 mg/dL — ABNORMAL LOW (ref 8.9–10.3)
Chloride: 106 mmol/L (ref 98–111)
Creatinine, Ser: 0.96 mg/dL (ref 0.61–1.24)
GFR calc Af Amer: >60 mL/min (ref >60)
GFR calc non Af Amer: >60 mL/min (ref >60)
Glucose, Bld: 113 mg/dL — ABNORMAL HIGH (ref 70–99)
Potassium: 3.7 mmol/L (ref 3.5–5.1)
Sodium: 134 mmol/L — ABNORMAL LOW (ref 135–145)

## 2020-01-19 LAB — COMPREHENSIVE METABOLIC PANEL
ALT: 16 U/L (ref 0–44)
AST: 18 U/L (ref 15–41)
Albumin: 2.8 g/dL — ABNORMAL LOW (ref 3.5–5.0)
Alkaline Phosphatase: 95 U/L (ref 38–126)
Anion gap: 6 (ref 5–15)
BUN: 12 mg/dL (ref 6–20)
CO2: 22 mmol/L (ref 22–32)
Calcium: 8.3 mg/dL — ABNORMAL LOW (ref 8.9–10.3)
Chloride: 104 mmol/L (ref 98–111)
Creatinine, Ser: 0.94 mg/dL (ref 0.61–1.24)
GFR calc Af Amer: >60 mL/min (ref >60)
GFR calc non Af Amer: >60 mL/min (ref >60)
Glucose, Bld: 96 mg/dL (ref 70–99)
Potassium: 3.8 mmol/L (ref 3.5–5.1)
Sodium: 132 mmol/L — ABNORMAL LOW (ref 135–145)
Total Bilirubin: 1 mg/dL (ref 0.3–1.2)
Total Protein: 7.5 g/dL (ref 6.5–8.1)

## 2020-01-19 LAB — D-DIMER, QUANTITATIVE: D-Dimer, Quant: 5.29 ug/mL-FEU — ABNORMAL HIGH (ref 0.00–0.50)

## 2020-01-19 LAB — HEMOGLOBIN AND HEMATOCRIT, BLOOD
HCT: 26.6 % — ABNORMAL LOW (ref 39.0–52.0)
Hemoglobin: 8.1 g/dL — ABNORMAL LOW (ref 13.0–17.0)

## 2020-01-19 LAB — TYPE AND SCREEN
ABO/RH(D): A POS
Antibody Screen: NEGATIVE
Unit division: 0

## 2020-01-19 LAB — MAGNESIUM: Magnesium: 1.9 mg/dL (ref 1.7–2.4)

## 2020-01-19 LAB — PHOSPHORUS: Phosphorus: 2.9 mg/dL (ref 2.5–4.6)

## 2020-01-19 LAB — FERRITIN: Ferritin: 125 ng/mL (ref 24–336)

## 2020-01-19 LAB — GLUCOSE, CAPILLARY: Glucose-Capillary: 98 mg/dL (ref 70–99)

## 2020-01-19 MED ORDER — HEPARIN SOD (PORK) LOCK FLUSH 100 UNIT/ML IV SOLN
500.0000 [IU] | INTRAVENOUS | Status: AC | PRN
  Administered 2020-01-19: 500 [IU]
  Filled 2020-01-19: qty 5

## 2020-01-19 MED ORDER — FERROUS SULFATE 325 (65 FE) MG PO TABS: 325.0000 mg | ORAL_TABLET | Freq: Every day | ORAL | 3 refills | Status: AC

## 2020-01-19 MED ORDER — SODIUM CHLORIDE 0.9 % IV SOLN: INTRAVENOUS | Status: DC

## 2020-01-19 MED ORDER — APIXABAN 2.5 MG PO TABS: 2.5000 mg | ORAL_TABLET | Freq: Two times a day (BID) | ORAL | 0 refills | Status: DC

## 2020-01-19 MED ORDER — CARVEDILOL 6.25 MG PO TABS: 6.2500 mg | ORAL_TABLET | Freq: Two times a day (BID) | ORAL | 1 refills | Status: DC

## 2020-01-19 NOTE — Discharge Summary (Signed)
Physician Discharge Summary  [name] EOF:[id number] DOB: [birth year]   PCP: Patient, No Pcp Per  Admit date: 01/15/2020 Discharge date: 01/19/2020 Length of Stay: 4 days   Code Status: Full Code  Admitted From:  Home Discharged to:   [place]:  None  Equipment/Devices:  None Discharge Condition:  Stable  Recommendations for Outpatient Follow-up   1. Follow up with hematologist in 1 month 2. Follow up BMP/CBC  3. Started on DVT prophylaxis with low-dose Eliquis 2.5 mg twice daily at discharge after discussion with Dr. Irene Limbo, patient's otologist/oncologist, as patient is at increased risk of DVT with COVID-19, elevated D-dimer and underlying metastatic malignancy.  Monitor for bleeding.  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  While hospitalized he completed 5 days of ceftriaxone/azithromycin.  Had elevated D-dimer and had negative bilateral lower extremity Doppler.  Remained on room air at rest and on exertion and did not need CT chest to rule out PE.  Patient's hemoglobin dropped to 6.9 without obvious source of bleed and initially refused blood transfusion but eventually agreed and was transfused 1 unit PRBC with improvement of Hb to 8.1.  After discussion with patient's oncologist given patient's elevated D-dimer of 5, it was recommended to continue DVT prophylaxis at discharge with low-dose Eliquis twice daily outpatient follow-up.  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 Sepsis secondary to COVID-19  infection /superimposed CAP of RLL a. Elevated inflammatory markers b. Received remdesivir x2 days then developed intolerance c. Day 5/5 ceftriaxone/azithromycin d. Tolerating room air and no need for steroids e. D-dimer elevated and has been on twice daily Lovenox during hospitalization, discharged on low-dose Eliquis twice daily 2. Atypical chest discomfort, likely musculoskeletal versus angina Resolved without any events 3. Acute on chronic normocytic anemia /any malignancy /iron deficiency anemia  a. Hb 6.9, improved to 8.1 after 1 unit PRBC transfusion b. Initially refusing blood transfusion however discussion today patient agreed to go ahead with PRBC transfusion c. Iron supplement at discharge d. Follow-up CBC and monitor while on anticoagulation 4. 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 Follows with Dr. Irene Limbo, appointment in 1 month 5. Gout a. Without active flare, recently completed prednisone course due to recent flare 6. Hypertension a. Start Coreg twice daily 7. Hyponatremia a. Improved with NS     Consultants  . None  Procedures  . None  Antibiotics   Anti-infectives (From admission, onward)   Start     Dose/Rate Route Frequency Ordered Stop   01/17/20 1000  remdesivir 100 mg in sodium chloride 0.9 % 100 mL IVPB  Status:  Discontinued     100 mg 200 mL/hr over 30 Minutes Intravenous Daily 01/16/20 0937 01/16/20 1803   01/16/20 1600  cefTRIAXone (ROCEPHIN) 1 g in sodium chloride 0.9 % 100 mL IVPB     1 g 200 mL/hr over 30 Minutes Intravenous Every 24 hours 01/16/20 0002 01/19/20 1655   01/16/20 1000  azithromycin (ZITHROMAX) tablet 250  mg  Status:  Discontinued     250 mg Oral Daily 01/16/20 0002 01/19/20 1354   01/16/20 1000  remdesivir 100 mg in sodium chloride 0.9 % 100 mL IVPB     100 mg 200 mL/hr over 30 Minutes Intravenous  Once 01/16/20 0937 01/16/20 1532   01/16/20 1000   remdesivir 100 mg in sodium chloride 0.9 % 100 mL IVPB     100 mg 200 mL/hr over 30 Minutes Intravenous  Once 01/16/20 0937 01/16/20 1732   01/15/20 1615  cefTRIAXone (ROCEPHIN) 1 g in sodium chloride 0.9 % 100 mL IVPB     1 g 200 mL/hr over 30 Minutes Intravenous  Once 01/15/20 1610 01/15/20 1828   01/15/20 1615  azithromycin (ZITHROMAX) tablet 500 mg     500 mg Oral  Once 01/15/20 1610 01/15/20 1624       Subjective  Patient seen and examined at bedside no acute distress and resting comfortably.  No events overnight.  Tolerating diet. In good spirits and anticipating discharge.   Denies any chest pain, shortness of breath, fever, nausea, vomiting, urinary or bowel complaints. Otherwise ROS negative    Objective   Discharge Exam: Vitals:   01/19/20 0918 01/19/20 1337  BP: 136/86 (!) 142/69  Pulse: 77 72  Resp: 18 17  Temp: 98.2 F (36.8 C) 98.4 F (36.9 C)  SpO2: 100% 99%   Vitals:   01/19/20 0044 01/19/20 0438 01/19/20 0918 01/19/20 1337  BP: 139/68 (!) 154/73 136/86 (!) 142/69  Pulse: 73 73 77 72  Resp: '16 16 18 17  ' Temp: 98 F (36.7 C) 99.8 F (37.7 C) 98.2 F (36.8 C) 98.4 F (36.9 C)  TempSrc: Oral Oral Oral Oral  SpO2: 100%  100% 99%  Weight:      Height:        Physical Exam Vitals and nursing note reviewed.  Constitutional:      Appearance: Normal appearance.  HENT:     Head: Normocephalic and atraumatic.  Eyes:     Conjunctiva/sclera: Conjunctivae normal.  Cardiovascular:     Rate and Rhythm: Normal rate and regular rhythm.  Pulmonary:     Effort: Pulmonary effort is normal.     Breath sounds: Normal breath sounds.  Abdominal:     General: Abdomen is flat.     Palpations: Abdomen is soft.  Musculoskeletal:        General: No swelling or tenderness.  Skin:    Coloration: Skin is not jaundiced or pale.  Neurological:     Mental Status: He is alert. Mental status is at baseline.  Psychiatric:        Mood and Affect: Mood normal.         Behavior: Behavior normal.       The results of significant diagnostics from this hospitalization (including imaging, microbiology, ancillary and laboratory) are listed below for reference.     Microbiology: Recent Results (from the past 240 hour(s))  Blood Culture (routine x 2)     Status: None (Preliminary result)   Collection Time: 01/15/20  3:42 PM   Specimen: BLOOD  Result Value Ref Range Status   Specimen Description   Final    BLOOD PORTA CATH Performed at [facility] [address], [place], [place] [address]    Special Requests   Final    BOTTLES DRAWN AEROBIC AND ANAEROBIC Blood Culture adequate volume Performed at [facility] [address], [place], [place] [address]  Culture   Final    NO GROWTH 4 DAYS Performed at [facility], [place] [address] [place], [place] [address]    Report Status PENDING  Incomplete  Blood Culture (routine x 2)     Status: None (Preliminary result)   Collection Time: 01/15/20  3:47 PM   Specimen: BLOOD  Result Value Ref Range Status   Specimen Description   Final    BLOOD LEFT ARM Performed at [facility] [address] [place], [place] [address]    Special Requests   Final    BOTTLES DRAWN AEROBIC AND ANAEROBIC Blood Culture adequate volume Performed at [facility] [address], [place], [place] [address]    Culture   Final    NO GROWTH 4 DAYS Performed at [facility], [place] [address] [place], [place] [address]    Report Status PENDING  Incomplete  SARS CORONAVIRUS 2 (TAT 6-24 HRS) Nasopharyngeal Nasopharyngeal Swab     Status: Abnormal   Collection Time: 01/15/20  5:49 PM   Specimen: Nasopharyngeal Swab  Result Value Ref Range Status   SARS Coronavirus 2 POSITIVE (A) NEGATIVE Final    Comment: RESULT CALLED TO, READ BACK BY AND VERIFIED WITH: J. TALKINGTON,CHARGE RN [id number] 01/16/2020 T. TYSOR (NOTE) SARS-CoV-2 target nucleic  acids are DETECTED. The SARS-CoV-2 RNA is generally detectable in upper and lower respiratory specimens during the acute phase of infection. Positive results are indicative of the presence of SARS-CoV-2 RNA. Clinical correlation with patient history and other diagnostic information is  necessary to determine patient infection status. Positive results do not rule out bacterial infection or co-infection with other viruses.  The expected result is Negative. Fact Sheet for Patients: SugarRoll.be Fact Sheet for Healthcare Providers: https://www.woods-mathews.com/ This test is not yet approved or cleared by the [place] FDA and  has been authorized for detection and/or diagnosis of SARS-CoV-2 by FDA under an Emergency Use Authorization (EUA). This EUA will remain  in effect (meaning this test can be  used) for the duration of the COVID-19 declaration under Section 564(b)(1) of the Act, 21 U.S.C. section 360bbb-3(b)(1), unless the authorization is terminated or revoked sooner. Performed at [facility], [place] [address], [place], [place] [address]   Culture, Urine     Status: None   Collection Time: 01/15/20  8:54 PM   Specimen: Urine, Random  Result Value Ref Range Status   Specimen Description   Final    URINE, RANDOM Performed at [facility] [address] [place], [place] [address]    Special Requests   Final    NONE Performed at [facility], [place] [address] [place], [place] [address]    Culture   Final    NO GROWTH Performed at [facility], [place] [address] [place], [place] [address]    Report Status 01/17/2020 FINAL  Final     Labs: BNP (last 3 results) Recent Labs    05/10/19 0545 01/16/20 0405  BNP 40.1 67.6   Basic Metabolic Panel: Recent Labs  Lab 01/16/20 0405 01/17/20 0459 01/18/20 0352 01/19/20 0436 01/19/20 1551  NA 132* 133* 133* 132* 134*  K 3.9 3.7 3.9  3.8 3.7  CL 100 102 103 104 106  CO2 '24 22 22 22 ' 20*  GLUCOSE 113* 112* 100* 96 113*  BUN '14 12 12 12 12  ' CREATININE 1.01 0.93 0.90 0.94 0.96  CALCIUM 8.1* 8.2* 8.3* 8.3* 8.1*  MG  --  1.9 2.0 1.9  --   PHOS  --  2.8 2.5 2.9  --    Liver Function Tests: Recent Labs  Lab 01/15/20 1542 01/17/20 0459 01/18/20 0352 01/19/20 0436  AST '21 18 16 18  ' ALT '22 18 16 16  ' ALKPHOS 118 92 91 95  BILITOT 1.3* 1.1 1.0 1.0  PROT 7.6 7.2 7.2 7.5  ALBUMIN 3.2* 2.9* 2.8* 2.8*   No results for input(s): LIPASE, AMYLASE in the last 168 hours. No results for input(s): AMMONIA in the last 168 hours. CBC: Recent Labs  Lab 01/15/20 1542 01/15/20 1542 01/16/20 0405 01/17/20 0459 01/18/20 0352 01/19/20 0436 01/19/20 1551  WBC 16.6*  --  16.4* 17.3* 15.2* 14.1*  --   NEUTROABS 12.7*  --   --  13.5* 11.4* 10.7*  --   HGB 7.5*   < > 7.2* 6.9* 6.9* 7.9* 8.1*  HCT 25.0*   < > 23.9* 23.1* 23.1* 25.6* 26.6*  MCV 97.3  --  97.6 97.5 96.7 96.2  --   PLT 113*  --  103* 116* 132* 151  --    < > = values in this interval not displayed.   Cardiac Enzymes: No results for input(s): CKTOTAL, CKMB, CKMBINDEX, TROPONINI in the last 168 hours. BNP: Invalid input(s): POCBNP CBG: Recent Labs  Lab 01/16/20 0831 01/17/20 0856 01/18/20 0944 01/19/20 0916  GLUCAP 101* 86 95 98   D-Dimer Recent Labs    01/18/20 0352 01/19/20 0436  DDIMER 4.13* 5.29*   Hgb A1c No results for input(s): HGBA1C in the last 72 hours. Lipid Profile No results for input(s): CHOL, HDL, LDLCALC, TRIG, CHOLHDL, LDLDIRECT in the last 72 hours. Thyroid function studies No results for input(s): TSH, T4TOTAL, T3FREE, THYROIDAB in the last 72 hours.  Invalid input(s): FREET3 Anemia work up Recent Labs    01/18/20 0352 01/19/20 0436  FERRITIN 123 125   Urinalysis    Component Value Date/Time   COLORURINE YELLOW 01/15/2020 2054   APPEARANCEUR CLEAR 01/15/2020 2054   LABSPEC 1.019 01/15/2020 2054   PHURINE 6.0 01/15/2020  2054   [place] 01/15/2020 2054   HGBUR NEGATIVE 01/15/2020 2054   BILIRUBINUR NEGATIVE 01/15/2020 2054   KETONESUR 5 (A) 01/15/2020 2054   PROTEINUR NEGATIVE 01/15/2020 2054   UROBILINOGEN 0.2 06/15/2012 1746   NITRITE NEGATIVE 01/15/2020 2054   LEUKOCYTESUR NEGATIVE 01/15/2020 2054   Sepsis Labs Invalid input(s): PROCALCITONIN,  WBC,  LACTICIDVEN Microbiology Recent Results (from the past 240 hour(s))  Blood Culture (routine x 2)     Status: None (Preliminary result)   Collection Time: 01/15/20  3:42 PM   Specimen: BLOOD  Result Value Ref Range Status   Specimen Description   Final    BLOOD PORTA CATH Performed at [facility], [place] [address], [place], [place] [address]    Special Requests   Final    BOTTLES DRAWN AEROBIC AND ANAEROBIC Blood Culture adequate volume Performed at [facility] [address] [place], [place] [address]    Culture   Final    NO GROWTH 4 DAYS Performed at [facility], [place] [address] [place], [place] [address]    Report Status PENDING  Incomplete  Blood Culture (routine x 2)     Status: None (Preliminary result)   Collection Time: 01/15/20  3:47 PM   Specimen: BLOOD  Result Value Ref Range Status   Specimen Description   Final    BLOOD LEFT  ARM Performed at [facility], [place] [address] [place], [place] [address]    Special Requests   Final    BOTTLES DRAWN AEROBIC AND ANAEROBIC Blood Culture adequate volume Performed at [facility] [address], [place], [place] [address]    Culture   Final    NO GROWTH 4 DAYS Performed at [facility], [place] [address] [place], [place] [address]    Report Status PENDING  Incomplete  SARS CORONAVIRUS 2 (TAT 6-24 HRS) Nasopharyngeal Nasopharyngeal Swab     Status: Abnormal   Collection Time: 01/15/20  5:49 PM   Specimen: Nasopharyngeal Swab  Result Value Ref Range Status   SARS Coronavirus 2 POSITIVE  (A) NEGATIVE Final    Comment: RESULT CALLED TO, READ BACK BY AND VERIFIED WITH: J. TALKINGTON,CHARGE RN 6270 01/16/2020 T. TYSOR (NOTE) SARS-CoV-2 target nucleic acids are DETECTED. The SARS-CoV-2 RNA is generally detectable in upper and lower respiratory specimens during the acute phase of infection. Positive results are indicative of the presence of SARS-CoV-2 RNA. Clinical correlation with patient history and other diagnostic information is  necessary to determine patient infection status. Positive results do not rule out bacterial infection or co-infection with other viruses.  The expected result is Negative. Fact Sheet for Patients: SugarRoll.be Fact Sheet for Healthcare Providers: https://www.woods-mathews.com/ This test is not yet approved or cleared by the [place] FDA and  has been authorized for detection and/or diagnosis of SARS-CoV-2 by FDA under an Emergency Use Authorization (EUA). This EUA will remain  in effect (meaning this test can be  used) for the duration of the COVID-19 declaration under Section 564(b)(1) of the Act, 21 U.S.C. section 360bbb-3(b)(1), unless the authorization is terminated or revoked sooner. Performed at [facility], [place] [address], [place], [place] [address]   Culture, Urine     Status: None   Collection Time: 01/15/20  8:54 PM   Specimen: Urine, Random  Result Value Ref Range Status   Specimen Description   Final    URINE, RANDOM Performed at [facility] [address] [place], [place] [address]    Special Requests   Final    NONE Performed at [facility], [place] [address], [place], [place] [address]    Culture   Final    NO GROWTH Performed at [facility], [place] [address] [place], [place] [address]    Report Status 01/17/2020 FINAL  Final    Discharge Instructions     Discharge Instructions    Diet - low sodium heart healthy    Complete by: As directed    Discharge instructions   Complete by: As directed    You were seen in the hospital for COVID-19.  Upon discharge: -Start Eliquis 2.5 mg twice daily to prevent blood clots for the next month.  If this medication is too expensive you can take aspirin 325 mg daily over-the-counter.  This is a blood thinner.  Make sure that you monitor for any bleeding and be very careful when walking and getting up.  Do your best not to fall as this increases risk of bleeding.  Monitor your stool for black tarry appearance or bleeding and stop these medications immediately.  Contact your primary care physician or return to the ED if this occurs -Start carvedilol 6.25 mg twice daily for blood pressure and heart rate -Start ferrous sulfate 325 mg daily for iron supplement -Get lab work in 1 week -  Follow-up with Dr. Irene Limbo as already scheduled  -You are still contagious with COVID-19 and should self isolate at home.  Isolation can be discontinued when the following criteria are met:  At least 10 days have passed since symptoms first appeared AND You are at least 1 day (24 hours) since resolution of fever without the use of any fever reducing medications (Tylenol, Motrin, ibuprofen, etc.) AND There is improvement in your symptoms (ex. shortness of breath, cough, etc.)  If you have any questions do not hesitate to contact your primary care physician or return to the ED if worsening symptoms.   Increase activity slowly   Complete by: As directed      Allergies as of 01/19/2020      Reactions   Remdesivir Other (See Comments)   Chest tightness      Medication List    STOP taking these medications   dexamethasone 4 MG tablet Commonly known as: DECADRON   lidocaine-prilocaine cream Commonly known as: EMLA     TAKE these medications   apixaban 2.5 MG Tabs tablet Commonly known as: Eliquis Take 1 tablet (2.5 mg total) by mouth 2 (two) times daily.   carvedilol 6.25 MG  tablet Commonly known as: COREG Take 1 tablet (6.25 mg total) by mouth 2 (two) times daily with a meal. Start taking on: January 20, 2020   ferrous sulfate 325 (65 FE) MG tablet Take 1 tablet (325 mg total) by mouth daily with breakfast.   LORazepam 0.5 MG tablet Commonly known as: Ativan Take 1 tablet (0.5 mg total) by mouth every 6 (six) hours as needed (Nausea or vomiting).   ondansetron 8 MG tablet Commonly known as: Zofran Take 1 tablet (8 mg total) by mouth 2 (two) times daily as needed for refractory nausea / vomiting. Start on day 3 after carboplatin chemo.   oxyCODONE 5 MG immediate release tablet Commonly known as: Oxy IR/ROXICODONE Take 1-2 tablets (5-10 mg total) by mouth every 4 (four) hours as needed for severe pain.       Allergies  Allergen Reactions  . Remdesivir Other (See Comments)    Chest tightness    Time coordinating discharge: Over 30 minutes   SIGNED:   Harold Hedge, D.O. [facility] Pager: [phone]  01/19/2020, 5:28 PM

## 2020-01-19 NOTE — Plan of Care (Signed)
  Problem: Nutrition: Goal: Adequate nutrition will be maintained Outcome: Adequate for Discharge   Problem: Nutrition: Goal: Adequate nutrition will be maintained Outcome: Adequate for Discharge   Problem: Coping: Goal: Level of anxiety will decrease Outcome: Adequate for Discharge   Problem: Clinical Measurements: Goal: Respiratory complications will improve Outcome: Adequate for Discharge

## 2020-01-20 LAB — CULTURE, BLOOD (ROUTINE X 2)
Culture: NO GROWTH
Culture: NO GROWTH
Special Requests: ADEQUATE
Special Requests: ADEQUATE

## 2020-01-23 ENCOUNTER — Other Ambulatory Visit: Payer: Self-pay | Admitting: Hematology

## 2020-01-23 NOTE — Telephone Encounter (Signed)
Patient request refill

## 2020-01-24 MED ORDER — OXYCODONE HCL 5 MG PO TABS: 5.0000 mg | ORAL_TABLET | ORAL | 0 refills | Status: DC | PRN

## 2020-02-06 ENCOUNTER — Other Ambulatory Visit: Payer: Self-pay

## 2020-02-06 ENCOUNTER — Ambulatory Visit (HOSPITAL_COMMUNITY)
Admission: EM | Admit: 2020-02-06 | Discharge: 2020-02-06 | Disposition: A | Discharge status: Home / Self Care | Payer: Medicaid Other | Attending: Physician Assistant | Admitting: Physician Assistant

## 2020-02-06 ENCOUNTER — Encounter (HOSPITAL_COMMUNITY): Payer: Self-pay

## 2020-02-06 DIAGNOSIS — T7840XA Allergy, unspecified, initial encounter: Secondary | ICD-10-CM | POA: Diagnosis not present

## 2020-02-06 MED ORDER — PREDNISONE 20 MG PO TABS: 40.0000 mg | ORAL_TABLET | Freq: Every day | ORAL | 0 refills | Status: DC

## 2020-02-06 MED ORDER — HYDROXYZINE HCL 25 MG PO TABS: 25.0000 mg | ORAL_TABLET | Freq: Four times a day (QID) | ORAL | 0 refills | Status: AC

## 2020-02-06 NOTE — Discharge Instructions (Signed)
The prednisone as directed Take the hydroxyzine for itch.  This would not make you sleepy so you may take this throughout the day  If the rash is not improving in a few days please return If you have difficulty breathing or worsening swelling in your mouth or throat please go to the emergency department.  Hold your medicines until you see your oncologist on Monday.  If you develop lower extremity swelling or pain please seek evaluation.

## 2020-02-06 NOTE — ED Provider Notes (Signed)
[name]    CSN: [id number] Arrival date & time: 02/06/20  4944      History   Chief Complaint Chief Complaint  Patient presents with  . lip swollen  . Rash    HPI [name] is a 59 y.o. male.   Patient reports for evaluation of rash and lip swelling.  Reports the rash started 4 days ago.  He reports his rash is itchy and red on his arms, legs and back.  Denies involvement of his torso or face.  Reports the rash is neither gotten better or worse.  He has remained itchy.  Denies pain or weepage.  He has tried a itch cream without much improvement.  Reports this morning he woke with lip swelling of his upper lip.  Denies tongue swelling or sensation that his throat is swelling or difficulty breathing.  He believes this is related to medicines that he was placed on at discharge from hospital on 01/19/2020.  Of note he was hospitalized for Covid pneumonia.  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  The medicines he was placed on at discharge were recommended to be Eliquis, carvedilol and iron supplementation.  He was unable to take the Eliquis due to cost and did not take aspirin as originally prescribed.  He did start the carvedilol and iron supplements.  He reports stopping this soon as his rash showed up 4 days ago.  He reports he has follow-up appointment on 02/13/2020.     Past Medical History:  Diagnosis Date  . Arthritis    "knees; left hand"  . Cancer ([place])   . Gout   . Heart murmur    Longstanding. 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 Hypertension   . Near syncope    05/2011 with tachypalpitations & with echo showing mild LVH, normal EF 55-60%, trivial AR    Patient Active Problem List   Diagnosis Date Noted  . Sepsis ([place]) 01/15/2020  . Community acquired pneumonia of right lower lobe of lung   . Fever   . Flank pain   . Cellulitis 12/11/2018  . Port-A-Cath in place 11/15/2018  . Bone  metastases ([place]) 10/20/2018  . Metastatic small cell carcinoma involving liver with unknown primary site ([place]) 10/20/2018  . Counseling regarding advance care planning and goals of care 10/20/2018  . Gout 03/21/2016  . Chest pain 02/18/2012  . Alcohol abuse 02/18/2012  . Syncope 05/28/2011  . Palpitations 05/28/2011  . Murmur 05/28/2011    Past Surgical History:  Procedure Laterality Date  . IR IMAGING GUIDED PORT INSERTION  10/26/2018  . NO PAST SURGERIES         Home Medications    Prior to Admission medications   Medication Sig Start Date End Date Taking? Authorizing Provider  apixaban (ELIQUIS) 2.5 MG TABS tablet Take 1 tablet (2.5 mg total) by mouth 2 (two) times daily. 01/19/20 02/18/20  Harold Hedge, MD  carvedilol (COREG) 6.25 MG tablet Take 1 tablet (6.25 mg total) by mouth 2 (two) times daily with a meal. 01/20/20   Harold Hedge, MD  ferrous sulfate 325 (65 FE) MG tablet Take 1 tablet (325 mg total) by mouth daily with breakfast. 01/19/20   Harold Hedge, MD  hydrOXYzine (ATARAX/VISTARIL) 25 MG tablet Take 1 tablet (25 mg total) by mouth every 6 (six) hours. 02/06/20   Eliani Leclere, Marguerita Beards, PA-C  LORazepam (ATIVAN) 0.5 MG tablet Take 1 tablet (  0.5 mg total) by mouth every 6 (six) hours as needed (Nausea or vomiting). 01/16/20   Brunetta Genera, MD  ondansetron (ZOFRAN) 8 MG tablet Take 1 tablet (8 mg total) by mouth 2 (two) times daily as needed for refractory nausea / vomiting. Start on day 3 after carboplatin chemo. 10/06/19   Brunetta Genera, MD  oxyCODONE (OXY IR/ROXICODONE) 5 MG immediate release tablet Take 1-2 tablets (5-10 mg total) by mouth every 4 (four) hours as needed for severe pain. 01/24/20   Brunetta Genera, MD  predniSONE (DELTASONE) 20 MG tablet Take 2 tablets (40 mg total) by mouth daily with breakfast for 5 days. 02/06/20 02/11/20  Dorris Vangorder, Marguerita Beards, PA-C    Family History Family History  Problem Relation Age of Onset  . Cancer Mother        Possibly  pancreatic. Died at age 22  . Prostate cancer Father   . Breast cancer Cousin   . Diabetes Paternal Uncle   . Heart disease Paternal Uncle     Social History Social History   Tobacco Use  . Smoking status: Never Smoker  . Smokeless tobacco: Never Used  Substance Use Topics  . Alcohol use: Not Currently    Comment: states he has cut back/ every now and again  . Drug use: No     Allergies   Remdesivir   Review of Systems Review of Systems  Constitutional: Negative for chills and fever.  HENT: Positive for facial swelling. Negative for sore throat, trouble swallowing and voice change.   Respiratory: Negative for cough, shortness of breath and wheezing.   Cardiovascular: Negative for chest pain and palpitations.  Gastrointestinal: Negative.   Skin: Positive for color change and rash.  Neurological: Negative for headaches.     Physical Exam Triage Vital Signs ED Triage Vitals  Enc Vitals Group     BP 02/06/20 1016 (!) 144/78     Pulse Rate 02/06/20 1016 85     Resp 02/06/20 1016 18     Temp 02/06/20 1016 98.4 F (36.9 C)     Temp Source 02/06/20 1016 Oral     SpO2 02/06/20 1016 99 %     Weight 02/06/20 1014 159 lb 9.6 oz (72.4 kg)     Height --      Head Circumference --      Peak Flow --      Pain Score 02/06/20 1014 0     Pain Loc --      Pain Edu? --      Excl. in [place]? --    No data found.  Updated Vital Signs BP (!) 144/78 (BP Location: Right Arm)   Pulse 85   Temp 98.4 F (36.9 C) (Oral)   Resp 18   Wt 159 lb 9.6 oz (72.4 kg)   SpO2 99%   BMI 25.00 kg/m   Visual Acuity Right Eye Distance:   Left Eye Distance:   Bilateral Distance:    Right Eye Near:   Left Eye Near:    Bilateral Near:     Physical Exam Vitals and nursing note reviewed.  Constitutional:      General: He is not in acute distress.    Appearance: He is well-developed. He is not ill-appearing.  HENT:     Head: Normocephalic and atraumatic.     Mouth/Throat:     Comments:  Mild upper lip angioedema.  There is no oropharyngeal swelling.  Tongue without edema or swelling.  Airway  is patent. Eyes:     Extraocular Movements: Extraocular movements intact.     Conjunctiva/sclera: Conjunctivae normal.     Pupils: Pupils are equal, round, and reactive to light.  Cardiovascular:     Rate and Rhythm: Normal rate and regular rhythm.     Heart sounds: No murmur.  Pulmonary:     Effort: Pulmonary effort is normal. No respiratory distress.     Breath sounds: Normal breath sounds. No stridor. No wheezing, rhonchi or rales.  Abdominal:     Palpations: Abdomen is soft.     Tenderness: There is no abdominal tenderness.  Musculoskeletal:     Cervical back: Neck supple.  Skin:    General: Skin is warm and dry.     Comments: Erythematous raised blanching rash on extremities, back and chest.  Consistent with urticarial eruption.  There are no punctate bite wounds  Neurological:     General: No focal deficit present.     Mental Status: He is alert and oriented to person, place, and time.      UC Treatments / Results  Labs (all labs ordered are listed, but only abnormal results are displayed) Labs Reviewed - No data to display  EKG   Radiology No results found.  Procedures Procedures (including critical care time)  Medications Ordered in UC Medications - No data to display  Initial Impression / Assessment and Plan / UC Course  I have reviewed the triage vital signs and the nursing notes.  Pertinent labs & imaging results that were available during my care of the patient were reviewed by me and considered in my medical decision making (see chart for details).     #Allergic reaction Is a 60 year old presenting with symptoms consistent with allergic reaction.  It is not entirely clear what the etiology is at this time.  There is a possibility that it could be medication related however the medications patient had been taking for several weeks prior to reaction,  as well reaction seem to progressed following cessation of medications.  Will place patient on prednisone and hydroxyzine for itch with strict return precautions if not improving or development of respiratory symptoms.  Patient does have follow-up with oncology for discussion of restart of medications for DVT prophylaxis.  Instructed patient to reach out to the prescribers of his medications this week to discuss restart versus waiting.  Discussed signs of blood clots with patient and he verbalizes full understanding of this.  Patient feels comfortable with the plan and verbalizes understanding.   Final Clinical Impressions(s) / UC Diagnoses   Final diagnoses:  Allergic reaction, initial encounter     Discharge Instructions     The prednisone as directed Take the hydroxyzine for itch.  This would not make you sleepy so you may take this throughout the day  If the rash is not improving in a few days please return If you have difficulty breathing or worsening swelling in your mouth or throat please go to the emergency department.  Hold your medicines until you see your oncologist on Monday.  If you develop lower extremity swelling or pain please seek evaluation.      ED Prescriptions    Medication Sig Dispense Auth. Provider   predniSONE (DELTASONE) 20 MG tablet Take 2 tablets (40 mg total) by mouth daily with breakfast for 5 days. 10 tablet Cassi Jenne, Marguerita Beards, PA-C   hydrOXYzine (ATARAX/VISTARIL) 25 MG tablet Take 1 tablet (25 mg total) by mouth every 6 (six) hours. 12 tablet  Demetrius Mahler, Marguerita Beards, PA-C     PDMP not reviewed this encounter.   Purnell Shoemaker, PA-C 02/07/20 [phone]

## 2020-02-06 NOTE — ED Triage Notes (Signed)
Pt states he has a swollen top lip and rash. Pt state he thinks it was a allergic reaction to some meds he was prescribed. Pt states he has been itching.

## 2020-02-08 ENCOUNTER — Other Ambulatory Visit: Payer: Self-pay | Admitting: Hematology

## 2020-02-08 MED ORDER — OXYCODONE HCL 5 MG PO TABS: 5.0000 mg | ORAL_TABLET | ORAL | 0 refills | Status: AC | PRN

## 2020-02-08 NOTE — Telephone Encounter (Signed)
Refill requested

## 2020-02-09 ENCOUNTER — Emergency Department (HOSPITAL_COMMUNITY): Payer: Medicaid Other

## 2020-02-09 ENCOUNTER — Encounter (HOSPITAL_COMMUNITY): Payer: Self-pay | Admitting: Emergency Medicine

## 2020-02-09 ENCOUNTER — Other Ambulatory Visit: Payer: Self-pay

## 2020-02-09 ENCOUNTER — Inpatient Hospital Stay (HOSPITAL_COMMUNITY)
Admission: EM | Admit: 2020-02-09 | Discharge: 2020-02-16 | DRG: 871 | Disposition: A | Discharge status: Hospice | Payer: Medicaid Other | Attending: Internal Medicine | Admitting: Internal Medicine

## 2020-02-09 DIAGNOSIS — C801 Malignant (primary) neoplasm, unspecified: Secondary | ICD-10-CM | POA: Diagnosis not present

## 2020-02-09 DIAGNOSIS — Z515 Encounter for palliative care: Secondary | ICD-10-CM | POA: Diagnosis not present

## 2020-02-09 DIAGNOSIS — C3490 Malignant neoplasm of unspecified part of unspecified bronchus or lung: Secondary | ICD-10-CM | POA: Diagnosis present

## 2020-02-09 DIAGNOSIS — R6521 Severe sepsis with septic shock: Secondary | ICD-10-CM | POA: Diagnosis present

## 2020-02-09 DIAGNOSIS — Z8042 Family history of malignant neoplasm of prostate: Secondary | ICD-10-CM

## 2020-02-09 DIAGNOSIS — K922 Gastrointestinal hemorrhage, unspecified: Secondary | ICD-10-CM

## 2020-02-09 DIAGNOSIS — E872 Acidosis: Secondary | ICD-10-CM | POA: Diagnosis present

## 2020-02-09 DIAGNOSIS — C787 Secondary malignant neoplasm of liver and intrahepatic bile duct: Secondary | ICD-10-CM | POA: Diagnosis present

## 2020-02-09 DIAGNOSIS — Z833 Family history of diabetes mellitus: Secondary | ICD-10-CM

## 2020-02-09 DIAGNOSIS — Z6824 Body mass index (BMI) 24.0-24.9, adult: Secondary | ICD-10-CM

## 2020-02-09 DIAGNOSIS — D689 Coagulation defect, unspecified: Secondary | ICD-10-CM | POA: Diagnosis present

## 2020-02-09 DIAGNOSIS — Z8616 Personal history of COVID-19: Secondary | ICD-10-CM | POA: Diagnosis not present

## 2020-02-09 DIAGNOSIS — K921 Melena: Secondary | ICD-10-CM | POA: Diagnosis not present

## 2020-02-09 DIAGNOSIS — I10 Essential (primary) hypertension: Secondary | ICD-10-CM | POA: Diagnosis present

## 2020-02-09 DIAGNOSIS — Z66 Do not resuscitate: Secondary | ICD-10-CM | POA: Diagnosis not present

## 2020-02-09 DIAGNOSIS — Z803 Family history of malignant neoplasm of breast: Secondary | ICD-10-CM | POA: Diagnosis not present

## 2020-02-09 DIAGNOSIS — A4189 Other specified sepsis: Principal | ICD-10-CM | POA: Diagnosis present

## 2020-02-09 DIAGNOSIS — Z881 Allergy status to other antibiotic agents status: Secondary | ICD-10-CM | POA: Diagnosis not present

## 2020-02-09 DIAGNOSIS — M170 Bilateral primary osteoarthritis of knee: Secondary | ICD-10-CM | POA: Diagnosis present

## 2020-02-09 DIAGNOSIS — A0472 Enterocolitis due to Clostridium difficile, not specified as recurrent: Secondary | ICD-10-CM | POA: Diagnosis present

## 2020-02-09 DIAGNOSIS — Z8249 Family history of ischemic heart disease and other diseases of the circulatory system: Secondary | ICD-10-CM | POA: Diagnosis not present

## 2020-02-09 DIAGNOSIS — R0600 Dyspnea, unspecified: Secondary | ICD-10-CM | POA: Diagnosis not present

## 2020-02-09 DIAGNOSIS — Z7189 Other specified counseling: Secondary | ICD-10-CM | POA: Diagnosis not present

## 2020-02-09 DIAGNOSIS — D62 Acute posthemorrhagic anemia: Secondary | ICD-10-CM | POA: Diagnosis not present

## 2020-02-09 DIAGNOSIS — Z7901 Long term (current) use of anticoagulants: Secondary | ICD-10-CM | POA: Diagnosis not present

## 2020-02-09 DIAGNOSIS — Z20822 Contact with and (suspected) exposure to covid-19: Secondary | ICD-10-CM | POA: Diagnosis present

## 2020-02-09 DIAGNOSIS — E875 Hyperkalemia: Secondary | ICD-10-CM | POA: Diagnosis not present

## 2020-02-09 DIAGNOSIS — M19042 Primary osteoarthritis, left hand: Secondary | ICD-10-CM | POA: Diagnosis present

## 2020-02-09 DIAGNOSIS — A414 Sepsis due to anaerobes: Secondary | ICD-10-CM | POA: Diagnosis present

## 2020-02-09 DIAGNOSIS — C7A8 Other malignant neuroendocrine tumors: Secondary | ICD-10-CM | POA: Diagnosis present

## 2020-02-09 DIAGNOSIS — M109 Gout, unspecified: Secondary | ICD-10-CM | POA: Diagnosis present

## 2020-02-09 DIAGNOSIS — A419 Sepsis, unspecified organism: Secondary | ICD-10-CM | POA: Diagnosis present

## 2020-02-09 DIAGNOSIS — R509 Fever, unspecified: Secondary | ICD-10-CM | POA: Diagnosis present

## 2020-02-09 DIAGNOSIS — N179 Acute kidney failure, unspecified: Secondary | ICD-10-CM | POA: Diagnosis not present

## 2020-02-09 DIAGNOSIS — G4733 Obstructive sleep apnea (adult) (pediatric): Secondary | ICD-10-CM | POA: Diagnosis present

## 2020-02-09 DIAGNOSIS — R112 Nausea with vomiting, unspecified: Secondary | ICD-10-CM | POA: Diagnosis present

## 2020-02-09 DIAGNOSIS — C7951 Secondary malignant neoplasm of bone: Secondary | ICD-10-CM | POA: Diagnosis present

## 2020-02-09 DIAGNOSIS — R531 Weakness: Secondary | ICD-10-CM | POA: Diagnosis not present

## 2020-02-09 DIAGNOSIS — R627 Adult failure to thrive: Secondary | ICD-10-CM | POA: Diagnosis present

## 2020-02-09 HISTORY — DX: Other complications of anesthesia, initial encounter: T88.59XA

## 2020-02-09 LAB — CBC
HCT: 25.4 % — ABNORMAL LOW (ref 39.0–52.0)
Hemoglobin: 7.6 g/dL — ABNORMAL LOW (ref 13.0–17.0)
MCH: 27.4 pg (ref 26.0–34.0)
MCHC: 29.9 g/dL — ABNORMAL LOW (ref 30.0–36.0)
MCV: 91.7 fL (ref 80.0–100.0)
Platelets: 163 10*3/uL (ref 150–400)
RBC: 2.77 MIL/uL — ABNORMAL LOW (ref 4.22–5.81)
RDW: 15.3 % (ref 11.5–15.5)
WBC: 14.6 10*3/uL — ABNORMAL HIGH (ref 4.0–10.5)
nRBC: 0 % (ref 0.0–0.2)

## 2020-02-09 LAB — URINALYSIS, ROUTINE W REFLEX MICROSCOPIC
Bilirubin Urine: NEGATIVE
Glucose, UA: NEGATIVE mg/dL
Hgb urine dipstick: NEGATIVE
Ketones, ur: NEGATIVE mg/dL
Leukocytes,Ua: NEGATIVE
Nitrite: NEGATIVE
Protein, ur: NEGATIVE mg/dL
Specific Gravity, Urine: 1.02 (ref 1.005–1.030)
pH: 5 (ref 5.0–8.0)

## 2020-02-09 LAB — COMPREHENSIVE METABOLIC PANEL
ALT: 23 U/L (ref 0–44)
AST: 31 U/L (ref 15–41)
Albumin: 3 g/dL — ABNORMAL LOW (ref 3.5–5.0)
Alkaline Phosphatase: 99 U/L (ref 38–126)
Anion gap: 10 (ref 5–15)
BUN: 38 mg/dL — ABNORMAL HIGH (ref 6–20)
CO2: 21 mmol/L — ABNORMAL LOW (ref 22–32)
Calcium: 8.9 mg/dL (ref 8.9–10.3)
Chloride: 104 mmol/L (ref 98–111)
Creatinine, Ser: 1.09 mg/dL (ref 0.61–1.24)
GFR calc Af Amer: >60 mL/min (ref >60)
GFR calc non Af Amer: >60 mL/min (ref >60)
Glucose, Bld: 149 mg/dL — ABNORMAL HIGH (ref 70–99)
Potassium: 4.3 mmol/L (ref 3.5–5.1)
Sodium: 135 mmol/L (ref 135–145)
Total Bilirubin: 1.3 mg/dL — ABNORMAL HIGH (ref 0.3–1.2)
Total Protein: 8.5 g/dL — ABNORMAL HIGH (ref 6.5–8.1)

## 2020-02-09 LAB — PROTIME-INR
INR: 0.8 (ref 0.8–1.2)
Prothrombin Time: 10.5 seconds — ABNORMAL LOW (ref 11.4–15.2)

## 2020-02-09 LAB — PROCALCITONIN: Procalcitonin: 0.83 ng/mL

## 2020-02-09 LAB — LIPASE, BLOOD: Lipase: 21 U/L (ref 11–51)

## 2020-02-09 LAB — LACTIC ACID, PLASMA
Lactic Acid, Venous: 3.7 mmol/L (ref 0.5–1.9)
Lactic Acid, Venous: 3.3 mmol/L (ref 0.5–1.9)

## 2020-02-09 LAB — APTT: aPTT: 34 seconds (ref 24–36)

## 2020-02-09 MED ORDER — SODIUM CHLORIDE (PF) 0.9 % IJ SOLN
INTRAMUSCULAR | Status: AC
  Filled 2020-02-09: qty 50

## 2020-02-09 MED ORDER — ONDANSETRON HCL 4 MG/2ML IJ SOLN
4.0000 mg | Freq: Once | INTRAMUSCULAR | Status: AC
  Administered 2020-02-09: 4 mg via INTRAVENOUS
  Filled 2020-02-09: qty 2

## 2020-02-09 MED ORDER — GADOBUTROL 1 MMOL/ML IV SOLN
7.5000 mL | Freq: Once | INTRAVENOUS | Status: AC | PRN
  Administered 2020-02-09: 7.5 mL via INTRAVENOUS

## 2020-02-09 MED ORDER — SODIUM CHLORIDE 0.9 % IV SOLN: INTRAVENOUS | Status: AC

## 2020-02-09 MED ORDER — METRONIDAZOLE IN NACL 5-0.79 MG/ML-% IV SOLN
500.0000 mg | Freq: Once | INTRAVENOUS | Status: AC
  Administered 2020-02-10: 500 mg via INTRAVENOUS
  Filled 2020-02-09: qty 100

## 2020-02-09 MED ORDER — SODIUM CHLORIDE 0.9 % IV SOLN
2.0000 g | Freq: Once | INTRAVENOUS | Status: AC
  Administered 2020-02-09: 2 g via INTRAVENOUS
  Filled 2020-02-09: qty 2

## 2020-02-09 MED ORDER — IOHEXOL 300 MG/ML  SOLN
100.0000 mL | Freq: Once | INTRAMUSCULAR | Status: AC | PRN
  Administered 2020-02-09: 100 mL via INTRAVENOUS

## 2020-02-09 MED ORDER — SODIUM CHLORIDE 0.9 % IV BOLUS
1000.0000 mL | Freq: Once | INTRAVENOUS | Status: AC
  Administered 2020-02-09: 1000 mL via INTRAVENOUS

## 2020-02-09 MED ORDER — LACTATED RINGERS IV BOLUS
1000.0000 mL | Freq: Once | INTRAVENOUS | Status: AC
  Administered 2020-02-09: 1000 mL via INTRAVENOUS

## 2020-02-09 MED ORDER — VANCOMYCIN HCL 1500 MG/300ML IV SOLN
1500.0000 mg | Freq: Once | INTRAVENOUS | Status: AC
  Administered 2020-02-10: 1500 mg via INTRAVENOUS
  Filled 2020-02-09: qty 300

## 2020-02-09 MED ORDER — VANCOMYCIN HCL IN DEXTROSE 1-5 GM/200ML-% IV SOLN: 1000.0000 mg | Freq: Once | INTRAVENOUS | Status: DC

## 2020-02-09 MED ORDER — HYDROMORPHONE HCL 1 MG/ML IJ SOLN
1.0000 mg | Freq: Once | INTRAMUSCULAR | Status: AC
  Administered 2020-02-09: 1 mg via INTRAVENOUS
  Filled 2020-02-09: qty 1

## 2020-02-09 NOTE — H&P (Signed)
[facility] History and Physical  [name] LPF:[id number] DOB: [birth year] DOA: 02/09/2020  Referring EDP: Regenia Skeeter, MD PCP: Patient, No Pcp Per   Chief Complaint: Abdominal and back pain  HPI: [name] is a 59 y.o. 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 Presented in sepsis.  Patient reports he was diagnosed with liver cancer in Nov 2019. He has been following with Dr. Irene Limbo since that time and completed chemotherapy. Mets to back was already a suspicion. Over the last couple days reports vague abdominal and back pain. Reports episodes of nausea and vomiting since last night but none prior. Abdominal pain is mostly located in epigastric and RUQ. Back pain is located across his entire lower back. He tried Tylenol without relief and therefore came to ER. Presents with fever but denies knowing he had a fever. Denies numbness in legs, difficulty urinating. Denies headache, dizziness, fever, chills, cough, SOB, chest pain, diarrhea, constipation, dysuria, hematuria, hematochezia, melena, difficulty moving arms/legs, speech difficulty, trouble eating, confusion or any other complaints.  In the ED: Tachycardic, febrile but overall stable on room air. Labs remarkable for WBC 14.6, Hgb 7.6 and Lactate 3.7. Lipase, CMP and UA WNL.  CXR: Low lung volumes. Right base atelectasis. Findings similar to prior study. CT Abd/Pelv: 1. Marked severity hepatomegaly with numerous heterogeneous low-attenuation liver lesions of various sizes, consistent with hepatic metastasis. 2. Findings consistent with osseous metastasis involving the T12, L2, L4 vertebral bodies and left iliac bone. 3. Low-attenuation soft tissue masses within the pancreas which may represent a primary malignancy.  Patient was given Zofran, Dilaudid and IVF. Cefepime, Vanc  and Flagyl ordered and called for admission.   Review of Systems:  All other systems negative unless noted above in HPI.   Past Medical History:  Diagnosis Date  . Arthritis    "knees; left hand"  . Cancer ([place])   . Gout   . Heart murmur    Longstanding. 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 Hypertension   . Near syncope    05/2011 with tachypalpitations & with echo showing mild LVH, normal EF 55-60%, trivial AR   Past Surgical History:  Procedure Laterality Date  . IR IMAGING GUIDED PORT INSERTION  10/26/2018  . NO PAST SURGERIES     Social History:  reports that he has never smoked. He has never used smokeless tobacco. He reports previous alcohol use. He reports that he does not use drugs.  Allergies  Allergen Reactions  . Remdesivir Other (See Comments)    Chest tightness    Family History  Problem Relation Age of Onset  . Cancer Mother        Possibly pancreatic. Died at age 46  . Prostate cancer Father   . Breast cancer Cousin   . Diabetes Paternal Uncle   . Heart disease Paternal Uncle      Prior to Admission medications   Medication Sig Start Date End Date Taking? Authorizing Provider  apixaban (ELIQUIS) 2.5 MG TABS tablet Take 1 tablet (2.5 mg total) by mouth 2 (two) times daily. 01/19/20 02/18/20 Yes Harold Hedge, MD  ferrous sulfate 325 (65 FE) MG tablet Take 1 tablet (325 mg total) by mouth daily with breakfast. 01/19/20  Yes Harold Hedge, MD  hydrOXYzine (ATARAX/VISTARIL) 25 MG tablet Take 1 tablet (25 mg  total) by mouth every 6 (six) hours. 02/06/20  Yes Darr, Marguerita Beards, PA-C  LORazepam (ATIVAN) 0.5 MG tablet Take 1 tablet (0.5 mg total) by mouth every 6 (six) hours as needed (Nausea or vomiting). 01/16/20  Yes Brunetta Genera, MD  ondansetron (ZOFRAN) 8 MG tablet Take 1 tablet (8 mg total) by mouth 2 (two) times daily as needed for refractory nausea / vomiting. Start on day 3 after carboplatin chemo. 10/06/19  Yes Brunetta Genera, MD  oxyCODONE (OXY IR/ROXICODONE) 5 MG immediate release tablet Take 1-2 tablets (5-10 mg total) by mouth every 4 (four) hours as needed for severe pain. 02/08/20  Yes Brunetta Genera, MD  predniSONE (DELTASONE) 20 MG tablet Take 2 tablets (40 mg total) by mouth daily with breakfast for 5 days. 02/06/20 02/11/20 Yes Darr, Marguerita Beards, PA-C  carvedilol (COREG) 6.25 MG tablet Take 1 tablet (6.25 mg total) by mouth 2 (two) times daily with a meal. Patient not taking: Reported on 02/09/2020 01/20/20   Harold Hedge, MD   Physical Exam: Vitals:   02/09/20 1945 02/09/20 1955 02/09/20 2030 02/09/20 2139  BP: (!) 160/77  (!) 142/73 (!) 153/77  Pulse: (!) 121  (!) 109 (!) 115  Resp: 16  18 18   Temp:  100.3 F (37.9 C)    TempSrc:  Rectal    SpO2: 100%  99% 97%    Wt Readings from Last 3 Encounters:  02/06/20 72.4 kg  01/15/20 72.6 kg  01/10/20 72.6 kg    . General:  Appears calm and comfortable. AAOx4.  . Eyes: EOMI, normal lids, irises & conjunctiva . ENT: grossly normal hearing, lips & tongue . Neck: normal ROM . Cardiovascular: Tachycardic with regular rhythm, no m/r/g. No LE edema. Marland Kitchen Respiratory: CTA bilaterally, no w/r/r. Normal respiratory effort. . Back: Mild tenderness to palpation across lower back bilaterally without focal findings or overt midline tenderness . Abdomen: soft, tenderness to palpation of epigastric region only  . Skin: no rash or induration seen on limited exam . Musculoskeletal: grossly normal tone BUE/BLE . Psychiatric: grossly normal mood and affect, speech fluent and appropriate . Neurologic: grossly non-focal.          Labs on Admission:  Basic Metabolic Panel: Recent Labs  Lab 02/09/20 1543  NA 135  K 4.3  CL 104  CO2 21*  GLUCOSE 149*  BUN 38*  CREATININE 1.09  CALCIUM 8.9   Liver Function Tests: Recent Labs  Lab 02/09/20 1543  AST 31  ALT 23  ALKPHOS 99  BILITOT 1.3*  PROT 8.5*  ALBUMIN 3.0*   Recent Labs  Lab 02/09/20 1543    LIPASE 21   No results for input(s): AMMONIA in the last 168 hours. CBC: Recent Labs  Lab 02/09/20 1543  WBC 14.6*  HGB 7.6*  HCT 25.4*  MCV 91.7  PLT 163   Cardiac Enzymes: No results for input(s): CKTOTAL, CKMB, CKMBINDEX, TROPONINI in the last 168 hours.  BNP (last 3 results) Recent Labs    05/10/19 0545 01/16/20 0405  BNP 40.1 81.9    ProBNP (last 3 results) No results for input(s): PROBNP in the last 8760 hours.  CBG: No results for input(s): GLUCAP in the last 168 hours.  Radiological Exams on Admission: CT ABDOMEN PELVIS W CONTRAST  Result Date: 02/09/2020 CLINICAL DATA:  Vomiting and back pain x4 days. EXAM: CT ABDOMEN AND PELVIS WITH CONTRAST TECHNIQUE: Multidetector CT imaging of the abdomen and pelvis was performed using the standard  protocol following bolus administration of intravenous contrast. CONTRAST:  193mL OMNIPAQUE IOHEXOL 300 MG/ML  SOLN COMPARISON:  January 15, 2020 FINDINGS: Lower chest: No acute abnormality. Hepatobiliary: The liver is markedly enlarged. Numerous heterogeneous low-attenuation liver lesions of various sizes are seen. No gallstones, gallbladder wall thickening, or biliary dilatation. Pancreas: A 2.1 cm x 2.6 cm heterogeneous low-attenuation mass is seen at the junction of the pancreatic body and tail (axial CT image 33, CT series number 2). An additional 2.6 cm x 2.9 cm heterogeneous low-attenuation mass is seen within the pancreatic tail (axial CT image 29, CT series number 2). Spleen: Small calcified granulomas are seen scattered throughout the spleen. Adrenals/Urinary Tract: Adrenal glands are unremarkable. Kidneys are normal, without renal calculi, focal lesion, or hydronephrosis. Bladder is unremarkable. Stomach/Bowel: Stomach is within normal limits. Appendix appears normal. No evidence of bowel wall thickening, distention, or inflammatory changes. A large amount of stool is seen throughout the colon. Vascular/Lymphatic: Moderate severity  aortic calcification. No enlarged abdominal or pelvic lymph nodes. Reproductive: Prostate is unremarkable. Other: No abdominal wall hernia or abnormality. No abdominopelvic ascites. Musculoskeletal: Sclerotic foci are seen involving the T12, L2, L4 vertebral bodies with additional small sclerotic foci noted within the left iliac bone. The IMPRESSION: 1. Marked severity hepatomegaly with numerous heterogeneous low-attenuation liver lesions of various sizes, consistent with hepatic metastasis. 2. Findings consistent with osseous metastasis involving the T12, L2, L4 vertebral bodies and left iliac bone. 3. Low-attenuation soft tissue masses within the pancreas which may represent a primary malignancy. Aortic Atherosclerosis (ICD10-I70.0). Electronically Signed   By: Virgina Norfolk M.D.   On: 02/09/2020 20:16   DG Chest Portable 1 View  Result Date: 02/09/2020 CLINICAL DATA:  Fever EXAM: PORTABLE CHEST 1 VIEW COMPARISON:  01/15/2020 FINDINGS: Right Port-A-Cath remains in place, unchanged. Low lung volumes. Right base atelectasis. Left lung clear. Heart is normal size. No effusions or acute bony abnormality. IMPRESSION: Low lung volumes. Right base atelectasis. Findings similar to prior study. Electronically Signed   By: Rolm Baptise M.D.   On: 02/09/2020 21:46    EKG: None  Assessment/Plan Principal Problem:   Sepsis ([place]) Active Problems:   Gout   Bone metastases ([facility])   Metastatic small cell carcinoma involving liver with unknown primary site [facility])   Fever  59 y.o. 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 Presented in sepsis.  Sepsis in setting of Metastatic lung cancer  - presents with fever and tachycardia in setting of immunosuppression from malignancy  - Lacate 3.7>3.3 - WBC 14.6, Procal elevated  - UA and CXR without evidence of  infection and patient without focal complaints beyond known abdominal pain and back pain explained by mets - Follow blood cx - Vanc/Cefepime/Flagyl ordered in ED; will cont at this time due to unknown source  - Follow lactate until normal - s/p 1 L IVF and will give another on admission and recheck lactate  - last seen by Oncologist Dr. Irene Limbo on 12/19/2019 - Cont PTA Eliquis which was started due to increase risk of VTE with underlying malignancy   Gout - Hold PTA prednisone due to infection  HTN - was started on Coreg with recent admission in March but patient reports he is not taking; resume PRN  Code Status: Full  DVT Prophylaxis: PTA Eliquis  Family Communication: None Disposition Plan: Admit to  inpatient. Patient is at high risk for further decompensation due to age and co-morbidities. Patient requiring IV abx, IVF and further workup for sepsis.  Time spent: 70 minutes  Chauncey Mann, MD [facility] Pager [phone]

## 2020-02-09 NOTE — ED Notes (Signed)
Date and time results received: 02/09/20 2046 (use smartphrase ".now" to insert current time)  Test: lactic acid  Critical Value: 3.7  Name of Provider Notified: Regenia Skeeter, MD   Orders Received? Or Actions Taken?: Orders Received - See Orders for details   Results obtained by [place], [place] lab

## 2020-02-09 NOTE — ED Provider Notes (Signed)
[name]   CSN: [id number] Arrival date & time: 02/09/20  1518     History Chief Complaint  Patient presents with   Emesis   Back Pain    [name] is a 59 y.o. male.  HPI 59 year old male presents with back pain, vomiting, and abdominal pain.  Back pain started out 4 days ago.  Worse with certain types of movements.  Diffuse across the low back.  No radiation down his legs or weakness/numbness in extremities.  No incontinence.  Couple days ago started to have upper abdominal pain.  Today he started having vomiting.  No diarrhea, fever, shortness of breath or cough.  Pain is about a 6 out of 10.  Has tried Tylenol and topical medicine without relief.   Past Medical History:  Diagnosis Date   Arthritis    "knees; left hand"   Cancer ([place])    Gout    Heart murmur    Longstanding. 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 Died at age 38   Prostate cancer Father    Breast cancer Cousin    Diabetes Paternal Uncle    Heart disease  Paternal Uncle     Social History   Tobacco Use   Smoking status: Never Smoker   Smokeless tobacco: Never Used  Substance Use Topics   Alcohol use: Not Currently    Comment: states he has cut back/ every now and again   Drug use: No    Home Medications Prior to Admission medications   Medication Sig Start Date End Date Taking? Authorizing Provider  apixaban (ELIQUIS) 2.5 MG TABS tablet Take 1 tablet (2.5 mg total) by mouth 2 (two) times daily. 01/19/20 02/18/20 Yes Harold Hedge, MD  ferrous sulfate 325 (65 FE) MG tablet Take 1 tablet (325 mg total) by mouth daily with breakfast. 01/19/20  Yes Harold Hedge, MD  hydrOXYzine (ATARAX/VISTARIL) 25 MG tablet Take 1 tablet (25 mg total) by mouth every 6 (six) hours. 02/06/20  Yes Darr, Marguerita Beards, PA-C  LORazepam (ATIVAN) 0.5 MG tablet Take 1 tablet (0.5 mg total) by mouth every 6 (six) hours as needed (Nausea or vomiting). 01/16/20  Yes Brunetta Genera, MD  ondansetron (ZOFRAN) 8 MG tablet Take 1 tablet (8 mg total) by mouth 2 (two) times daily as needed for refractory nausea / vomiting. Start on day 3 after carboplatin chemo. 10/06/19  Yes Brunetta Genera, MD  oxyCODONE (OXY IR/ROXICODONE)  5 MG immediate release tablet Take 1-2 tablets (5-10 mg total) by mouth every 4 (four) hours as needed for severe pain. 02/08/20  Yes Brunetta Genera, MD  predniSONE (DELTASONE) 20 MG tablet Take 2 tablets (40 mg total) by mouth daily with breakfast for 5 days. 02/06/20 02/11/20 Yes Darr, Marguerita Beards, PA-C  carvedilol (COREG) 6.25 MG tablet Take 1 tablet (6.25 mg total) by mouth 2 (two) times daily with a meal. Patient not taking: Reported on 02/09/2020 01/20/20   Harold Hedge, MD    Allergies    Remdesivir  Review of Systems   Review of Systems  Constitutional: Negative for fever.  Respiratory: Negative for shortness of breath.   Cardiovascular: Negative for chest pain.  Gastrointestinal: Positive for abdominal pain, nausea and vomiting. Negative  for diarrhea.  Genitourinary: Negative for dysuria.  Musculoskeletal: Positive for back pain.  Neurological: Negative for weakness and numbness.  All other systems reviewed and are negative.   Physical Exam Updated Vital Signs BP (!) 153/82    Pulse (!) 105    Temp 98.9 F (37.2 C) (Oral)    Resp (!) 21    SpO2 100%   Physical Exam Vitals and nursing Hernandez reviewed.  Constitutional:      General: He is not in acute distress.    Appearance: He is well-developed. He is not diaphoretic.  HENT:     Head: Normocephalic and atraumatic.     Right Ear: External ear normal.     Left Ear: External ear normal.     Nose: Nose normal.  Eyes:     General:        Right eye: No discharge.        Left eye: No discharge.  Cardiovascular:     Rate and Rhythm: Regular rhythm. Tachycardia present.     Heart sounds: Normal heart sounds.  Pulmonary:     Effort: Pulmonary effort is normal.     Breath sounds: Normal breath sounds.  Abdominal:     Palpations: Abdomen is soft.     Tenderness: There is abdominal tenderness in the epigastric area. There is no right CVA tenderness or left CVA tenderness.    Musculoskeletal:     Cervical back: Neck supple.     Comments: No significant back tenderness noted  Skin:    General: Skin is warm and dry.  Neurological:     Mental Status: He is alert.     Comments: 5/5 strength in BLE. Normal gross sensation  Psychiatric:        Mood and Affect: Mood is not anxious.     ED Results / Procedures / Treatments   Labs (all labs ordered are listed, but only abnormal results are displayed) Labs Reviewed  COMPREHENSIVE METABOLIC PANEL - Abnormal; Notable for the following components:      Result Value   CO2 21 (*)    Glucose, Bld 149 (*)    BUN 38 (*)    Total Protein 8.5 (*)    Albumin 3.0 (*)    Total Bilirubin 1.3 (*)    All other components within normal limits  CBC - Abnormal; Notable for the following components:   WBC 14.6 (*)    RBC 2.77 (*)     Hemoglobin 7.6 (*)    HCT 25.4 (*)    MCHC 29.9 (*)    All other components within normal limits  LACTIC ACID, PLASMA - Abnormal; Notable for the following components:   Lactic Acid,  Venous 3.7 (*)    All other components within normal limits  LACTIC ACID, PLASMA - Abnormal; Notable for the following components:   Lactic Acid, Venous 3.3 (*)    All other components within normal limits  PROTIME-INR - Abnormal; Notable for the following components:   Prothrombin Time 10.5 (*)    All other components within normal limits  CULTURE, BLOOD (ROUTINE X 2)  CULTURE, BLOOD (ROUTINE X 2)  URINE CULTURE  SARS CORONAVIRUS 2 (TAT 6-24 HRS)  LIPASE, BLOOD  URINALYSIS, ROUTINE W REFLEX MICROSCOPIC  PROCALCITONIN  APTT  LACTIC ACID, PLASMA  LACTIC ACID, PLASMA    EKG None  Radiology MR THORACIC SPINE W WO CONTRAST  Result Date: 02/10/2020 CLINICAL DATA:  Initial evaluation for lower back pain with elevated lactic acid, evaluate for spinal infection. History of metastatic neuroendocrine tumor. EXAM: MRI THORACIC AND LUMBAR SPINE WITHOUT AND WITH CONTRAST TECHNIQUE: Multiplanar and multiecho pulse sequences of the thoracic and lumbar spine were obtained without and with intravenous contrast. CONTRAST:  7.3mL GADAVIST GADOBUTROL 1 MMOL/ML IV SOLN COMPARISON:  Prior CT from earlier the same day as well as previous PET-CT from 10/15/2018. FINDINGS: MRI THORACIC SPINE FINDINGS Alignment: Physiologic with preservation of the normal thoracic kyphosis. No listhesis. Vertebrae: Multiple scattered osseous metastases seen throughout the thoracic spine. These are seen at numerous levels, including the T2, T4, T5, T6, T9, and T11 levels. For reference purposes, largest of these lesions is seen at T6, with the T6 vertebral body is largely replaced by tumor. Vertebral body height maintained without associated pathologic fracture. There is associated epidural extension with tumor seen extending into the ventral  epidural space at the levels of T6 (series 29, image 19), and T11 (series 29, image 43). Associated mild to moderate spinal stenosis at these levels, most pronounced at T11. No other significant epidural or extraosseous tumor. Underlying bone marrow signal intensity diffusely decreased on T1 weighted sequence, likely related to history of malignancy and post treatment changes. No findings to suggest osteomyelitis discitis or septic arthritis. Cord: Signal intensity within the thoracic spinal cord is within normal limits. No epidural abscess or other collection. Paraspinal and other soft tissues: Paraspinous soft tissues demonstrate no acute finding. Minimal atelectatic changes noted within the partially visualized lungs. Partially visualized liver is markedly enlarged with innumerable metastatic lesions, better seen on prior CT. Disc levels: T4-5: Small central disc protrusion mildly indents the ventral thecal sac. No significant stenosis. T8-9: Mild disc bulge, eccentric to the left. No significant stenosis. No other significant disc pathology seen within the thoracic spine. No other significant stenosis. MRI LUMBAR SPINE FINDINGS Segmentation: Transitional lumbosacral anatomy with sacralized L5 vertebral body. Last well-formed disc space labeled L4-5 on this exam. Alignment: Physiologic with preservation of the normal lumbar lordosis. No listhesis. Vertebrae: Multiple scattered osseous metastases seen throughout the lumbar spine, specifically involving the L1 and L3 vertebral bodies. For reference purposes, the largest lesion is seen at L3 and measures 2.4 cm in size. Probable few additional lesions noted within the partially visualized pelvis. No extra osseous or epidural tumor within the lumbar spine. Underlying bone marrow signal intensity diffusely decreased on T1 weighted imaging. No evidence for osteomyelitis discitis or septic arthritis. Conus medullaris: Extends to the T12-L1 level and appears normal.  Paraspinal and other soft tissues: Paraspinous soft tissues demonstrate no acute finding. Liver is markedly enlarged with innumerable metastases, better seen on prior CT. Disc levels: Diffuse congenital shortening of the pedicles noted. L1-2:  Negative interspace.  Mild facet hypertrophy.  No stenosis. L2-3: Chronic intervertebral disc space narrowing with diffuse disc bulge and disc desiccation. Mild to moderate facet hypertrophy. Changes superimposed on short pedicles resultant moderate spinal stenosis. Mild bilateral foraminal narrowing. L3-4: Mild diffuse disc bulge with disc desiccation, asymmetric to the left. Mild to moderate facet hypertrophy. Trace bilateral joint effusions. Mild epidural lipomatosis. Changes superimposed on short pedicles resultant moderate spinal stenosis. Mild bilateral L3 foraminal narrowing. L4-5: Chronic intervertebral disc space narrowing with diffuse disc bulge and disc desiccation. Superimposed broad-based central disc protrusion with slight superior angulation and annular fissure. Moderate right worse than left facet hypertrophy. Epidural lipomatosis. Changes superimposed on short pedicles resultant mild-to-moderate spinal stenosis. Foramina remain patent. L5-S1: Transitional lumbosacral anatomy with rudimentary L5-S1 disc. No stenosis. IMPRESSION: MRI THORACIC SPINE IMPRESSION 1. No MRI evidence for acute infection within the thoracic spine. 2. Multiple osseous metastatic lesions involving the thoracic spine as above, most pronounced at T6 and T11 where there is associated extension of tumor into the adjacent ventral epidural space. Resultant mild to moderate spinal stenosis at these levels, most pronounced at T11. 3. Marked hepatomegaly with innumerable hepatic metastases, better seen on prior abdominal CT. MRI LUMBAR SPINE IMPRESSION 1. No MRI evidence for acute infection within the lumbar spine. 2. Osseous metastatic lesions involving the lumbar spine at the L1 and L3 levels.  No extra osseous or epidural tumor identified. 3. 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 4. Transitional lumbosacral anatomy. Careful correlation to numbering system on this exam recommended prior to any potential future intervention. Electronically Signed   By: Jeannine Boga M.D.   On: 02/10/2020 00:23   MR Lumbar Spine W Wo Contrast  Result Date: 02/10/2020 CLINICAL DATA:  Initial evaluation for lower back pain with elevated lactic acid, evaluate for spinal infection. History of metastatic neuroendocrine tumor. EXAM: MRI THORACIC AND LUMBAR SPINE WITHOUT AND WITH CONTRAST TECHNIQUE: Multiplanar and multiecho pulse sequences of the thoracic and lumbar spine were obtained without and with intravenous contrast. CONTRAST:  7.37mL GADAVIST GADOBUTROL 1 MMOL/ML IV SOLN COMPARISON:  Prior CT from earlier the same day as well as previous PET-CT from 10/15/2018. FINDINGS: MRI THORACIC SPINE FINDINGS Alignment: Physiologic with preservation of the normal thoracic kyphosis. No listhesis. Vertebrae: Multiple scattered osseous metastases seen throughout the thoracic spine. These are seen at numerous levels, including the T2, T4, T5, T6, T9, and T11 levels. For reference purposes, largest of these lesions is seen at T6, with the T6 vertebral body is largely replaced by tumor. Vertebral body height maintained without associated pathologic fracture. There is associated epidural extension with tumor seen extending into the ventral epidural space at the levels of T6 (series 29, image 19), and T11 (series 29, image 43). Associated mild to moderate spinal stenosis at these levels, most pronounced at T11. No other significant epidural or extraosseous tumor. Underlying bone marrow signal intensity diffusely decreased on T1 weighted sequence, likely related to history of malignancy and post treatment changes. No findings to suggest osteomyelitis discitis or  septic arthritis. Cord: Signal intensity within the thoracic spinal cord is within normal limits. No epidural abscess or other collection. Paraspinal and other soft tissues: Paraspinous soft tissues demonstrate no acute finding. Minimal atelectatic changes noted within the partially visualized lungs. Partially visualized liver is markedly enlarged with innumerable metastatic lesions, better seen on prior CT. Disc levels: T4-5: Small central disc protrusion mildly indents the ventral thecal sac. No significant stenosis. T8-9: Mild  disc bulge, eccentric to the left. No significant stenosis. No other significant disc pathology seen within the thoracic spine. No other significant stenosis. MRI LUMBAR SPINE FINDINGS Segmentation: Transitional lumbosacral anatomy with sacralized L5 vertebral body. Last well-formed disc space labeled L4-5 on this exam. Alignment: Physiologic with preservation of the normal lumbar lordosis. No listhesis. Vertebrae: Multiple scattered osseous metastases seen throughout the lumbar spine, specifically involving the L1 and L3 vertebral bodies. For reference purposes, the largest lesion is seen at L3 and measures 2.4 cm in size. Probable few additional lesions noted within the partially visualized pelvis. No extra osseous or epidural tumor within the lumbar spine. Underlying bone marrow signal intensity diffusely decreased on T1 weighted imaging. No evidence for osteomyelitis discitis or septic arthritis. Conus medullaris: Extends to the T12-L1 level and appears normal. Paraspinal and other soft tissues: Paraspinous soft tissues demonstrate no acute finding. Liver is markedly enlarged with innumerable metastases, better seen on prior CT. Disc levels: Diffuse congenital shortening of the pedicles noted. L1-2:  Negative interspace.  Mild facet hypertrophy.  No stenosis. L2-3: Chronic intervertebral disc space narrowing with diffuse disc bulge and disc desiccation. Mild to moderate facet  hypertrophy. Changes superimposed on short pedicles resultant moderate spinal stenosis. Mild bilateral foraminal narrowing. L3-4: Mild diffuse disc bulge with disc desiccation, asymmetric to the left. Mild to moderate facet hypertrophy. Trace bilateral joint effusions. Mild epidural lipomatosis. Changes superimposed on short pedicles resultant moderate spinal stenosis. Mild bilateral L3 foraminal narrowing. L4-5: Chronic intervertebral disc space narrowing with diffuse disc bulge and disc desiccation. Superimposed broad-based central disc protrusion with slight superior angulation and annular fissure. Moderate right worse than left facet hypertrophy. Epidural lipomatosis. Changes superimposed on short pedicles resultant mild-to-moderate spinal stenosis. Foramina remain patent. L5-S1: Transitional lumbosacral anatomy with rudimentary L5-S1 disc. No stenosis. IMPRESSION: MRI THORACIC SPINE IMPRESSION 1. No MRI evidence for acute infection within the thoracic spine. 2. Multiple osseous metastatic lesions involving the thoracic spine as above, most pronounced at T6 and T11 where there is associated extension of tumor into the adjacent ventral epidural space. Resultant mild to moderate spinal stenosis at these levels, most pronounced at T11. 3. Marked hepatomegaly with innumerable hepatic metastases, better seen on prior abdominal CT. MRI LUMBAR SPINE IMPRESSION 1. No MRI evidence for acute infection within the lumbar spine. 2. Osseous metastatic lesions involving the lumbar spine at the L1 and L3 levels. No extra osseous or epidural tumor identified. 3. 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 4. Transitional lumbosacral anatomy. Careful correlation to numbering system on this exam recommended prior to any potential future intervention. Electronically Signed   By: Jeannine Boga M.D.   On: 02/10/2020 00:23   CT ABDOMEN PELVIS W CONTRAST  Result  Date: 02/09/2020 CLINICAL DATA:  Vomiting and back pain x4 days. EXAM: CT ABDOMEN AND PELVIS WITH CONTRAST TECHNIQUE: Multidetector CT imaging of the abdomen and pelvis was performed using the standard protocol following bolus administration of intravenous contrast. CONTRAST:  162mL OMNIPAQUE IOHEXOL 300 MG/ML  SOLN COMPARISON:  January 15, 2020 FINDINGS: Lower chest: No acute abnormality. Hepatobiliary: The liver is markedly enlarged. Numerous heterogeneous low-attenuation liver lesions of various sizes are seen. No gallstones, gallbladder wall thickening, or biliary dilatation. Pancreas: A 2.1 cm x 2.6 cm heterogeneous low-attenuation mass is seen at the junction of the pancreatic body and tail (axial CT image 33, CT series number 2). An additional 2.6 cm x 2.9 cm heterogeneous low-attenuation mass is seen  within the pancreatic tail (axial CT image 29, CT series number 2). Spleen: Small calcified granulomas are seen scattered throughout the spleen. Adrenals/Urinary Tract: Adrenal glands are unremarkable. Kidneys are normal, without renal calculi, focal lesion, or hydronephrosis. Bladder is unremarkable. Stomach/Bowel: Stomach is within normal limits. Appendix appears normal. No evidence of bowel wall thickening, distention, or inflammatory changes. A large amount of stool is seen throughout the colon. Vascular/Lymphatic: Moderate severity aortic calcification. No enlarged abdominal or pelvic lymph nodes. Reproductive: Prostate is unremarkable. Other: No abdominal wall hernia or abnormality. No abdominopelvic ascites. Musculoskeletal: Sclerotic foci are seen involving the T12, L2, L4 vertebral bodies with additional small sclerotic foci noted within the left iliac bone. The IMPRESSION: 1. Marked severity hepatomegaly with numerous heterogeneous low-attenuation liver lesions of various sizes, consistent with hepatic metastasis. 2. Findings consistent with osseous metastasis involving the T12, L2, L4 vertebral bodies  and left iliac bone. 3. Low-attenuation soft tissue masses within the pancreas which may represent a primary malignancy. Aortic Atherosclerosis (ICD10-I70.0). Electronically Signed   By: Virgina Norfolk M.D.   On: 02/09/2020 20:16   DG Chest Portable 1 View  Result Date: 02/09/2020 CLINICAL DATA:  Fever EXAM: PORTABLE CHEST 1 VIEW COMPARISON:  01/15/2020 FINDINGS: Right Port-A-Cath remains in place, unchanged. Low lung volumes. Right base atelectasis. Left lung clear. Heart is normal size. No effusions or acute bony abnormality. IMPRESSION: Low lung volumes. Right base atelectasis. Findings similar to prior study. Electronically Signed   By: Rolm Baptise M.D.   On: 02/09/2020 21:46    Procedures .Critical Care Performed by: Sherwood Gambler, MD Authorized by: Sherwood Gambler, MD   Critical care provider statement:    Critical care time (minutes):  35   Critical care time was exclusive of:  Separately billable procedures and treating other patients   Critical care was necessary to treat or prevent imminent or life-threatening deterioration of the following conditions:  Sepsis   Critical care was time spent personally by me on the following activities:  Discussions with consultants, evaluation of patient's response to treatment, examination of patient, ordering and performing treatments and interventions, ordering and review of laboratory studies, ordering and review of radiographic studies, pulse oximetry, re-evaluation of patient's condition, obtaining history from patient or surrogate and review of old charts   (including critical care time)  Medications Ordered in ED Medications  sodium chloride (PF) 0.9 % injection (has no administration in time range)  metroNIDAZOLE (FLAGYL) IVPB 500 mg (500 mg Intravenous New Bag/Given 02/10/20 0006)  vancomycin (VANCOREADY) IVPB 1500 mg/300 mL (has no administration in time range)  0.9 %  sodium chloride infusion (has no administration in time range)    sodium chloride 0.9 % bolus 1,000 mL (0 mLs Intravenous Stopped 02/09/20 2117)  HYDROmorphone (DILAUDID) injection 1 mg (1 mg Intravenous Given 02/09/20 1942)  ondansetron (ZOFRAN) injection 4 mg (4 mg Intravenous Given 02/09/20 1940)  iohexol (OMNIPAQUE) 300 MG/ML solution 100 mL (100 mLs Intravenous Contrast Given 02/09/20 1956)  ceFEPIme (MAXIPIME) 2 g in sodium chloride 0.9 % 100 mL IVPB (0 g Intravenous Stopped 02/10/20 0032)  lactated ringers bolus 1,000 mL (1,000 mLs Intravenous New Bag/Given (Non-Interop) 02/09/20 2343)  gadobutrol (GADAVIST) 1 MMOL/ML injection 7.5 mL (7.5 mLs Intravenous Contrast Given 02/09/20 2303)    ED Course  I have reviewed the triage vital signs and the nursing notes.  Pertinent labs & imaging results that were available during my care of the patient were reviewed by me and considered in my medical decision  making (see chart for details).    MDM Rules/Calculators/A&P                      Patient was found to have low-grade temp there is nearly up to a fever along with his leukocytosis and lactic acidosis.  Abdominal mass appears to be coming from the liver.  At this point, I decided to give him antibiotics per sepsis protocol given no other clear source of his symptoms.  His back pain could be coming from the metastases seen though because of the temperature found, MRIs will be obtained.  Will give broad antibiotics and get cultures.  Admit to hospitalist service. Final Clinical Impression(s) / ED Diagnoses Final diagnoses:  Severe sepsis [facility])    Rx / DC Orders ED Discharge Orders    None       Sherwood Gambler, MD 02/10/20 [phone]

## 2020-02-09 NOTE — ED Notes (Signed)
Patient given urinal at this time and has been told that a urine sample is needed. Will follow up.

## 2020-02-09 NOTE — ED Notes (Signed)
Pt transported to MRI, admitting MD at bedside and stated hold antibiotics so pt can go to MRI. Chest x-ray completed, One set of blood cultures obtain before pt went to MRI failed to collect enough for second set will attempt second set when pt returns from MRI.

## 2020-02-09 NOTE — ED Triage Notes (Signed)
Pt reports vomiting that started last night. Back pains x 4 days.

## 2020-02-09 NOTE — ED Notes (Signed)
Pt transported to CT ?

## 2020-02-09 NOTE — Progress Notes (Signed)
A consult was received from an ED physician for vancomycin & cefepime per pharmacy dosing.  The patient's profile has been reviewed for ht/wt/allergies/indication/available labs.   A one time order has been placed for vancomycin 1500 mg and cefepime 2 gm.    Further antibiotics/pharmacy consults should be ordered by admitting physician if indicated.                       Thank you,  Eudelia Bunch, Pharm.D [phone] 02/09/2020 9:29 PM

## 2020-02-10 DIAGNOSIS — C7951 Secondary malignant neoplasm of bone: Secondary | ICD-10-CM

## 2020-02-10 DIAGNOSIS — R509 Fever, unspecified: Secondary | ICD-10-CM

## 2020-02-10 LAB — COMPREHENSIVE METABOLIC PANEL
ALT: 23 U/L (ref 0–44)
AST: 31 U/L (ref 15–41)
Albumin: 2.6 g/dL — ABNORMAL LOW (ref 3.5–5.0)
Alkaline Phosphatase: 89 U/L (ref 38–126)
Anion gap: 13 (ref 5–15)
BUN: 37 mg/dL — ABNORMAL HIGH (ref 6–20)
CO2: 17 mmol/L — ABNORMAL LOW (ref 22–32)
Calcium: 8.2 mg/dL — ABNORMAL LOW (ref 8.9–10.3)
Chloride: 105 mmol/L (ref 98–111)
Creatinine, Ser: 1 mg/dL (ref 0.61–1.24)
GFR calc Af Amer: >60 mL/min (ref >60)
GFR calc non Af Amer: >60 mL/min (ref >60)
Glucose, Bld: 142 mg/dL — ABNORMAL HIGH (ref 70–99)
Potassium: 4.2 mmol/L (ref 3.5–5.1)
Sodium: 135 mmol/L (ref 135–145)
Total Bilirubin: 1.3 mg/dL — ABNORMAL HIGH (ref 0.3–1.2)
Total Protein: 7.5 g/dL (ref 6.5–8.1)

## 2020-02-10 LAB — LACTIC ACID, PLASMA
Lactic Acid, Venous: 3.2 mmol/L (ref 0.5–1.9)
Lactic Acid, Venous: 3.1 mmol/L (ref 0.5–1.9)
Lactic Acid, Venous: 3.5 mmol/L (ref 0.5–1.9)

## 2020-02-10 LAB — CBC
HCT: 20.9 % — ABNORMAL LOW (ref 39.0–52.0)
Hemoglobin: 6.4 g/dL — CL (ref 13.0–17.0)
MCH: 28.3 pg (ref 26.0–34.0)
MCHC: 30.6 g/dL (ref 30.0–36.0)
MCV: 92.5 fL (ref 80.0–100.0)
Platelets: 160 10*3/uL (ref 150–400)
RBC: 2.26 MIL/uL — ABNORMAL LOW (ref 4.22–5.81)
RDW: 15.7 % — ABNORMAL HIGH (ref 11.5–15.5)
WBC: 11 10*3/uL — ABNORMAL HIGH (ref 4.0–10.5)
nRBC: 0 % (ref 0.0–0.2)

## 2020-02-10 LAB — SARS CORONAVIRUS 2 (TAT 6-24 HRS): SARS Coronavirus 2: NEGATIVE

## 2020-02-10 LAB — PREPARE RBC (CROSSMATCH)

## 2020-02-10 MED ORDER — HYDROMORPHONE HCL 1 MG/ML IJ SOLN
1.0000 mg | INTRAMUSCULAR | Status: DC | PRN
  Administered 2020-02-10 – 2020-02-12 (×9): 1 mg via INTRAVENOUS
  Filled 2020-02-10 (×10): qty 1

## 2020-02-10 MED ORDER — LORAZEPAM 0.5 MG PO TABS: 0.5000 mg | ORAL_TABLET | Freq: Four times a day (QID) | ORAL | Status: DC | PRN

## 2020-02-10 MED ORDER — PRO-STAT SUGAR FREE PO LIQD
30.0000 mL | Freq: Two times a day (BID) | ORAL | Status: DC
  Administered 2020-02-13: 30 mL via ORAL
  Filled 2020-02-10 (×4): qty 30

## 2020-02-10 MED ORDER — BOOST / RESOURCE BREEZE PO LIQD CUSTOM
1.0000 | Freq: Three times a day (TID) | ORAL | Status: DC
  Administered 2020-02-10 – 2020-02-11 (×2): 1 via ORAL

## 2020-02-10 MED ORDER — OXYCODONE HCL 5 MG PO TABS
5.0000 mg | ORAL_TABLET | ORAL | Status: DC | PRN
  Administered 2020-02-10 – 2020-02-11 (×3): 10 mg via ORAL
  Filled 2020-02-10 (×4): qty 2

## 2020-02-10 MED ORDER — METRONIDAZOLE IN NACL 5-0.79 MG/ML-% IV SOLN
500.0000 mg | Freq: Three times a day (TID) | INTRAVENOUS | Status: DC
  Administered 2020-02-10 – 2020-02-12 (×6): 500 mg via INTRAVENOUS
  Filled 2020-02-10 (×6): qty 100

## 2020-02-10 MED ORDER — SODIUM CHLORIDE 0.9% IV SOLUTION: Freq: Once | INTRAVENOUS | Status: AC

## 2020-02-10 MED ORDER — SODIUM CHLORIDE 0.9 % IV SOLN: INTRAVENOUS | Status: AC

## 2020-02-10 MED ORDER — APIXABAN 2.5 MG PO TABS
2.5000 mg | ORAL_TABLET | Freq: Two times a day (BID) | ORAL | Status: DC
  Filled 2020-02-10 (×2): qty 1

## 2020-02-10 MED ORDER — ONDANSETRON HCL 4 MG/2ML IJ SOLN
4.0000 mg | Freq: Once | INTRAMUSCULAR | Status: AC
  Administered 2020-02-10: 4 mg via INTRAVENOUS
  Filled 2020-02-10: qty 2

## 2020-02-10 MED ORDER — ONDANSETRON HCL 4 MG/2ML IJ SOLN
4.0000 mg | Freq: Four times a day (QID) | INTRAMUSCULAR | Status: DC | PRN
  Administered 2020-02-11: 4 mg via INTRAVENOUS
  Filled 2020-02-10: qty 2

## 2020-02-10 MED ORDER — SODIUM CHLORIDE 0.9 % IV SOLN
2.0000 g | Freq: Three times a day (TID) | INTRAVENOUS | Status: DC
  Administered 2020-02-10 – 2020-02-12 (×7): 2 g via INTRAVENOUS
  Filled 2020-02-10 (×9): qty 2

## 2020-02-10 MED ORDER — VANCOMYCIN HCL 1750 MG/350ML IV SOLN
1750.0000 mg | INTRAVENOUS | Status: DC
  Administered 2020-02-10 – 2020-02-11 (×2): 1750 mg via INTRAVENOUS
  Filled 2020-02-10 (×2): qty 350

## 2020-02-10 NOTE — Progress Notes (Signed)
Pharmacy Antibiotic Note  [name] is a 59 y.o. male admitted on 02/09/2020 with sepsis.  Pharmacy has been consulted for Vancomycin, cefepime dosing.  Plan: Vancomycin 1500mg  iv x1, then Vancomycin 1750 mg IV Q 24 hrs. Goal AUC 400-550. Expected AUC: 507 SCr used: 1  Cefepime 2gm iv q8hr    Temp (24hrs), Avg:99.1 F (37.3 C), Min:98 F (36.7 C), Max:100.3 F (37.9 C)  Recent Labs  Lab 02/09/20 1543 02/09/20 1945 02/09/20 2150 02/10/20 0237  WBC 14.6*  --   --  11.0*  CREATININE 1.09  --   --  1.00  LATICACIDVEN  --  3.7* 3.3* 3.2*    Estimated Creatinine Clearance: 74.4 mL/min (by C-G formula based on SCr of 1 mg/dL).    Allergies  Allergen Reactions  . Remdesivir Other (See Comments)    Chest tightness    Antimicrobials this admission: Vancomycin 02/09/2020 >> Cefepime 02/09/2020 >>   Dose adjustments this admission: -  Microbiology results: -  Thank you for allowing pharmacy to be a part of this patient's care.  Nani Skillern Crowford 02/10/2020 3:56 AM

## 2020-02-10 NOTE — ED Notes (Signed)
Pt returned from MRI second IV and second set of blood cultures drawn then antibiotics and bolus started.

## 2020-02-10 NOTE — Progress Notes (Signed)
PROGRESS NOTE  [name] IWP:[id number] DOB: 12/13/1960 DOA: 02/09/2020 PCP: Patient, No Pcp Per   LOS: 1 day   Brief narrative: As per HPI,  [name] is a 59 y.o. 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 Patient reports he was diagnosed with liver cancer in Nov 2019. He has been following with Dr. Irene Limbo since that time and completed chemotherapy. Mets to back was already a suspicion. Over the last couple days reports vague abdominal and back pain.  He also reported nausea and vomiting but has been having multiple episodes of loose stools as well.  In the ED:  Patient was tachycardic, febrile but overall stable on room air. Labs remarkable for WBC 14.6, Hgb 7.6 and Lactate 3.7. Lipase, CMP and UA WNL. CXR: Low lung volumes. Right base atelectasis. Findings similar to prior study.  CT scan of the abdomen pelvis showed marked severity hepatomegaly with numerous heterogeneous low-attenuation liver lesions of various sizes, consistent with hepatic metastasis.. Findings consistent with osseous metastasis involving the T12, L2, L4 vertebral bodies and left iliac bone. Low-attenuation soft tissue masses within the pancreas which may represent a primary malignancy. Patient was given Zofran, Dilaudid and IVF. Cefepime, Vanc and Flagyl ordered and called for admission.   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  Urinalysis and chest  x-ray without any obvious source of infection.  Follow blood cultures, urine cultures.  Vancomycin, cefepime and Flagyl was initiated patient received 1 L of IV fluid.  Patient was last seen by Dr. Irene Limbo on 12/19/2019.  Latest lactate of 3.2 after fluids.  Will receive blood transfusion as well.  Repeat lactate again.  Leukocytosis slightly trended down.  Patient with history of recent COVID-19 infection.  Abdominal pain, back pain.  Likely secondary to metastatic cancer. Resume home medication.  Significant anemia.  Hemoglobin of 6.4.  Will check a GI pathogen panel, stool occult blood.  Transfuse 1 unit of packed RBC today.  Patient was supposed to take Eliquis as per last admission due to history of Covid elevated D-dimer and underlying metastatic malignancy but has not been taking.  Nausea vomiting multiple episodes of diarrhea.  Nursing staff reported black tarry stool.  Possibility of bleeding.  Will get Hemoccult.  Hold Eliquis for now.  Check CBC closely.  Gout Hold prednisone for now.  No acute flare.Marland Kitchen  HTN Supposed to be on Coreg but not taking it due to some kind of side effect.    VTE Prophylaxis: Place SCD.  Patient states that he was supposed to be on Eliquis but could not breathe well so has not been taking over the last month.  Code Status: Full code  Family Communication: None today.  Disposition Plan:  . Patient is from home . Likely disposition to home likely in 2 to 3 days . Barriers to discharge: Ongoing diarrhea possible GI bleed, possible sepsis undergoing work-up, blood transfusion  Consultants:  None  Procedures:  Transfusion of packed RBC  Antibiotics:  .  Vancomycin, metronidazole and cefepime  Anti-infectives (From admission, onward)   Start     Dose/Rate Route Frequency Ordered Stop   02/10/20 2000  vancomycin (VANCOREADY) IVPB 1750 mg/350 mL     1,750 mg 175 mL/hr over 120 Minutes Intravenous Every 24 hours 02/10/20 0355     02/10/20 0800   metroNIDAZOLE (FLAGYL) IVPB 500 mg     500 mg 100 mL/hr over 60 Minutes Intravenous Every 8 hours 02/10/20 0118     02/10/20 0600  ceFEPIme (MAXIPIME) 2 g in sodium chloride 0.9 % 100 mL IVPB     2 g 200 mL/hr over 30 Minutes Intravenous Every 8 hours 02/10/20 0355     02/09/20 2200  vancomycin (VANCOREADY) IVPB 1500 mg/300 mL     1,500 mg 150 mL/hr over 120 Minutes Intravenous  Once 02/09/20 2128 02/10/20 0310   02/09/20 2130  ceFEPIme (MAXIPIME) 2 g in sodium chloride 0.9 % 100 mL IVPB     2 g 200 mL/hr over 30 Minutes Intravenous  Once 02/09/20 2120 02/10/20 0032   02/09/20 2130  metroNIDAZOLE (FLAGYL) IVPB 500 mg     500 mg 100 mL/hr over 60 Minutes Intravenous  Once 02/09/20 2120 02/10/20 0106   02/09/20 2130  vancomycin (VANCOCIN) IVPB 1000 mg/200 mL premix  Status:  Discontinued     1,000 mg 200 mL/hr over 60 Minutes Intravenous  Once 02/09/20 2120 02/09/20 2128       Subjective: Today, patient was seen and examined at bedside.  Complains of frequent episodes of diarrhea 6-7 times this morning with some black tarry stools.  Complains of mild abdominal and back pain.  Denies any shortness of breath cough but T-max of 100.3 degree noted on admission.  Objective: Vitals:   02/10/20 1046 02/10/20 1333  BP: (!) 150/72 140/73  Pulse: (!) 108 (!) 105  Resp: 15 20  Temp: 98.9 F (37.2 C) 98 F (36.7 C)  SpO2: 99% 100%    Intake/Output Summary (Last 24 hours) at 02/10/2020 1427 Last data filed at 02/10/2020 1320 Gross per 24 hour  Intake 3640.29 ml  Output --  Net 3640.29 ml   There were no vitals filed for this visit. There is no height or weight on file to calculate BMI.   Physical Exam: GENERAL: Patient is alert awake and communicative not in obvious distress. HENT: Mild pallor noted.  Pupils equally reactive to light. Oral mucosa is moist NECK: is supple, no gross swelling noted. CHEST: Clear to auscultation. No crackles or wheezes.  Diminished breath sounds  bilaterally. CVS: S1 and S2 heard, no murmur. Regular rate and rhythm.  Nonspecific tenderness on palpation of the back. ABDOMEN: Soft, tenderness over epigastric region on palpation.  Bowel sounds are present. EXTREMITIES: No edema. CNS: Cranial nerves are intact. No focal motor deficits. SKIN: warm and dry without rashes.  Data Review: I have personally reviewed the following laboratory data and studies,  CBC: Recent Labs  Lab 02/09/20 1543 02/10/20 0237  WBC 14.6* 11.0*  HGB 7.6* 6.4*  HCT 25.4* 20.9*  MCV 91.7 92.5  PLT 163 606   Basic Metabolic Panel: Recent Labs  Lab 02/09/20 1543 02/10/20 0237  NA 135 135  K 4.3 4.2  CL 104 105  CO2 21* 17*  GLUCOSE 149* 142*  BUN 38* 37*  CREATININE 1.09 1.00  CALCIUM 8.9 8.2*   Liver Function Tests: Recent Labs  Lab 02/09/20 1543 02/10/20 0237  AST 31 31  ALT 23 23  ALKPHOS 99  89  BILITOT 1.3* 1.3*  PROT 8.5* 7.5  ALBUMIN 3.0* 2.6*   Recent Labs  Lab 02/09/20 1543  LIPASE 21   No results for input(s): AMMONIA in the last 168 hours. Cardiac Enzymes: No results for input(s): CKTOTAL, CKMB, CKMBINDEX, TROPONINI in the last 168 hours. BNP (last 3 results) Recent Labs    05/10/19 0545 01/16/20 0405  BNP 40.1 81.9    ProBNP (last 3 results) No results for input(s): PROBNP in the last 8760 hours.  CBG: No results for input(s): GLUCAP in the last 168 hours. Recent Results (from the past 240 hour(s))  SARS CORONAVIRUS 2 (TAT 6-24 HRS) Nasopharyngeal Nasopharyngeal Swab     Status: None   Collection Time: 02/10/20 12:10 AM   Specimen: Nasopharyngeal Swab  Result Value Ref Range Status   SARS Coronavirus 2 NEGATIVE NEGATIVE Final    Comment: (NOTE) SARS-CoV-2 target nucleic acids are NOT DETECTED. The SARS-CoV-2 RNA is generally detectable in upper and lower respiratory specimens during the acute phase of infection. Negative results do not preclude SARS-CoV-2 infection, do not rule out co-infections with  other pathogens, and should not be used as the sole basis for treatment or other patient management decisions. Negative results must be combined with clinical observations, patient history, and epidemiological information. The expected result is Negative. Fact Sheet for Patients: SugarRoll.be Fact Sheet for Healthcare Providers: https://www.woods-mathews.com/ This test is not yet approved or cleared by the [place] FDA and  has been authorized for detection and/or diagnosis of SARS-CoV-2 by FDA under an Emergency Use Authorization (EUA). This EUA will remain  in effect (meaning this test can be used) for the duration of the COVID-19 declaration under Section 56 4(b)(1) of the Act, 21 U.S.C. section 360bbb-3(b)(1), unless the authorization is terminated or revoked sooner. Performed at [facility], [place] [address] [place], [place] [address]      Studies: MR THORACIC SPINE W WO CONTRAST  Result Date: 02/10/2020 CLINICAL DATA:  Initial evaluation for lower back pain with elevated lactic acid, evaluate for spinal infection. History of metastatic neuroendocrine tumor. EXAM: MRI THORACIC AND LUMBAR SPINE WITHOUT AND WITH CONTRAST TECHNIQUE: Multiplanar and multiecho pulse sequences of the thoracic and lumbar spine were obtained without and with intravenous contrast. CONTRAST:  7.47mL GADAVIST GADOBUTROL 1 MMOL/ML IV SOLN COMPARISON:  Prior CT from earlier the same day as well as previous PET-CT from 10/15/2018. FINDINGS: MRI THORACIC SPINE FINDINGS Alignment: Physiologic with preservation of the normal thoracic kyphosis. No listhesis. Vertebrae: Multiple scattered osseous metastases seen throughout the thoracic spine. These are seen at numerous levels, including the T2, T4, T5, T6, T9, and T11 levels. For reference purposes, largest of these lesions is seen at T6, with the T6 vertebral body is largely replaced by tumor. Vertebral body height  maintained without associated pathologic fracture. There is associated epidural extension with tumor seen extending into the ventral epidural space at the levels of T6 (series 29, image 19), and T11 (series 29, image 43). Associated mild to moderate spinal stenosis at these levels, most pronounced at T11. No other significant epidural or extraosseous tumor. Underlying bone marrow signal intensity diffusely decreased on T1 weighted sequence, likely related to history of malignancy and post treatment changes. No findings to suggest osteomyelitis discitis or septic arthritis. Cord: Signal intensity within the thoracic spinal cord is within normal limits. No epidural abscess or other collection. Paraspinal and other soft tissues: Paraspinous soft tissues demonstrate no acute finding. Minimal atelectatic changes noted within the  partially visualized lungs. Partially visualized liver is markedly enlarged with innumerable metastatic lesions, better seen on prior CT. Disc levels: T4-5: Small central disc protrusion mildly indents the ventral thecal sac. No significant stenosis. T8-9: Mild disc bulge, eccentric to the left. No significant stenosis. No other significant disc pathology seen within the thoracic spine. No other significant stenosis. MRI LUMBAR SPINE FINDINGS Segmentation: Transitional lumbosacral anatomy with sacralized L5 vertebral body. Last well-formed disc space labeled L4-5 on this exam. Alignment: Physiologic with preservation of the normal lumbar lordosis. No listhesis. Vertebrae: Multiple scattered osseous metastases seen throughout the lumbar spine, specifically involving the L1 and L3 vertebral bodies. For reference purposes, the largest lesion is seen at L3 and measures 2.4 cm in size. Probable few additional lesions noted within the partially visualized pelvis. No extra osseous or epidural tumor within the lumbar spine. Underlying bone marrow signal intensity diffusely decreased on T1 weighted  imaging. No evidence for osteomyelitis discitis or septic arthritis. Conus medullaris: Extends to the T12-L1 level and appears normal. Paraspinal and other soft tissues: Paraspinous soft tissues demonstrate no acute finding. Liver is markedly enlarged with innumerable metastases, better seen on prior CT. Disc levels: Diffuse congenital shortening of the pedicles noted. L1-2:  Negative interspace.  Mild facet hypertrophy.  No stenosis. L2-3: Chronic intervertebral disc space narrowing with diffuse disc bulge and disc desiccation. Mild to moderate facet hypertrophy. Changes superimposed on short pedicles resultant moderate spinal stenosis. Mild bilateral foraminal narrowing. L3-4: Mild diffuse disc bulge with disc desiccation, asymmetric to the left. Mild to moderate facet hypertrophy. Trace bilateral joint effusions. Mild epidural lipomatosis. Changes superimposed on short pedicles resultant moderate spinal stenosis. Mild bilateral L3 foraminal narrowing. L4-5: Chronic intervertebral disc space narrowing with diffuse disc bulge and disc desiccation. Superimposed broad-based central disc protrusion with slight superior angulation and annular fissure. Moderate right worse than left facet hypertrophy. Epidural lipomatosis. Changes superimposed on short pedicles resultant mild-to-moderate spinal stenosis. Foramina remain patent. L5-S1: Transitional lumbosacral anatomy with rudimentary L5-S1 disc. No stenosis. IMPRESSION: MRI THORACIC SPINE IMPRESSION 1. No MRI evidence for acute infection within the thoracic spine. 2. Multiple osseous metastatic lesions involving the thoracic spine as above, most pronounced at T6 and T11 where there is associated extension of tumor into the adjacent ventral epidural space. Resultant mild to moderate spinal stenosis at these levels, most pronounced at T11. 3. Marked hepatomegaly with innumerable hepatic metastases, better seen on prior abdominal CT. MRI LUMBAR SPINE IMPRESSION 1. No MRI  evidence for acute infection within the lumbar spine. 2. Osseous metastatic lesions involving the lumbar spine at the L1 and L3 levels. No extra osseous or epidural tumor identified. 3. 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 4. Transitional lumbosacral anatomy. Careful correlation to numbering system on this exam recommended prior to any potential future intervention. Electronically Signed   By: Jeannine Boga M.D.   On: 02/10/2020 00:23   MR Lumbar Spine W Wo Contrast  Result Date: 02/10/2020 CLINICAL DATA:  Initial evaluation for lower back pain with elevated lactic acid, evaluate for spinal infection. History of metastatic neuroendocrine tumor. EXAM: MRI THORACIC AND LUMBAR SPINE WITHOUT AND WITH CONTRAST TECHNIQUE: Multiplanar and multiecho pulse sequences of the thoracic and lumbar spine were obtained without and with intravenous contrast. CONTRAST:  7.54mL GADAVIST GADOBUTROL 1 MMOL/ML IV SOLN COMPARISON:  Prior CT from earlier the same day as well as previous PET-CT from 10/15/2018. FINDINGS: MRI THORACIC SPINE FINDINGS Alignment: Physiologic with preservation  of the normal thoracic kyphosis. No listhesis. Vertebrae: Multiple scattered osseous metastases seen throughout the thoracic spine. These are seen at numerous levels, including the T2, T4, T5, T6, T9, and T11 levels. For reference purposes, largest of these lesions is seen at T6, with the T6 vertebral body is largely replaced by tumor. Vertebral body height maintained without associated pathologic fracture. There is associated epidural extension with tumor seen extending into the ventral epidural space at the levels of T6 (series 29, image 19), and T11 (series 29, image 43). Associated mild to moderate spinal stenosis at these levels, most pronounced at T11. No other significant epidural or extraosseous tumor. Underlying bone marrow signal intensity diffusely decreased on T1  weighted sequence, likely related to history of malignancy and post treatment changes. No findings to suggest osteomyelitis discitis or septic arthritis. Cord: Signal intensity within the thoracic spinal cord is within normal limits. No epidural abscess or other collection. Paraspinal and other soft tissues: Paraspinous soft tissues demonstrate no acute finding. Minimal atelectatic changes noted within the partially visualized lungs. Partially visualized liver is markedly enlarged with innumerable metastatic lesions, better seen on prior CT. Disc levels: T4-5: Small central disc protrusion mildly indents the ventral thecal sac. No significant stenosis. T8-9: Mild disc bulge, eccentric to the left. No significant stenosis. No other significant disc pathology seen within the thoracic spine. No other significant stenosis. MRI LUMBAR SPINE FINDINGS Segmentation: Transitional lumbosacral anatomy with sacralized L5 vertebral body. Last well-formed disc space labeled L4-5 on this exam. Alignment: Physiologic with preservation of the normal lumbar lordosis. No listhesis. Vertebrae: Multiple scattered osseous metastases seen throughout the lumbar spine, specifically involving the L1 and L3 vertebral bodies. For reference purposes, the largest lesion is seen at L3 and measures 2.4 cm in size. Probable few additional lesions noted within the partially visualized pelvis. No extra osseous or epidural tumor within the lumbar spine. Underlying bone marrow signal intensity diffusely decreased on T1 weighted imaging. No evidence for osteomyelitis discitis or septic arthritis. Conus medullaris: Extends to the T12-L1 level and appears normal. Paraspinal and other soft tissues: Paraspinous soft tissues demonstrate no acute finding. Liver is markedly enlarged with innumerable metastases, better seen on prior CT. Disc levels: Diffuse congenital shortening of the pedicles noted. L1-2:  Negative interspace.  Mild facet hypertrophy.  No  stenosis. L2-3: Chronic intervertebral disc space narrowing with diffuse disc bulge and disc desiccation. Mild to moderate facet hypertrophy. Changes superimposed on short pedicles resultant moderate spinal stenosis. Mild bilateral foraminal narrowing. L3-4: Mild diffuse disc bulge with disc desiccation, asymmetric to the left. Mild to moderate facet hypertrophy. Trace bilateral joint effusions. Mild epidural lipomatosis. Changes superimposed on short pedicles resultant moderate spinal stenosis. Mild bilateral L3 foraminal narrowing. L4-5: Chronic intervertebral disc space narrowing with diffuse disc bulge and disc desiccation. Superimposed broad-based central disc protrusion with slight superior angulation and annular fissure. Moderate right worse than left facet hypertrophy. Epidural lipomatosis. Changes superimposed on short pedicles resultant mild-to-moderate spinal stenosis. Foramina remain patent. L5-S1: Transitional lumbosacral anatomy with rudimentary L5-S1 disc. No stenosis. IMPRESSION: MRI THORACIC SPINE IMPRESSION 1. No MRI evidence for acute infection within the thoracic spine. 2. Multiple osseous metastatic lesions involving the thoracic spine as above, most pronounced at T6 and T11 where there is associated extension of tumor into the adjacent ventral epidural space. Resultant mild to moderate spinal stenosis at these levels, most pronounced at T11. 3. Marked hepatomegaly with innumerable hepatic metastases, better seen on prior abdominal CT. MRI LUMBAR SPINE IMPRESSION 1.  No MRI evidence for acute infection within the lumbar spine. 2. Osseous metastatic lesions involving the lumbar spine at the L1 and L3 levels. No extra osseous or epidural tumor identified. 3. 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 4. Transitional lumbosacral anatomy. Careful correlation to numbering system on this exam recommended prior to any potential future  intervention. Electronically Signed   By: Jeannine Boga M.D.   On: 02/10/2020 00:23   CT ABDOMEN PELVIS W CONTRAST  Result Date: 02/09/2020 CLINICAL DATA:  Vomiting and back pain x4 days. EXAM: CT ABDOMEN AND PELVIS WITH CONTRAST TECHNIQUE: Multidetector CT imaging of the abdomen and pelvis was performed using the standard protocol following bolus administration of intravenous contrast. CONTRAST:  158mL OMNIPAQUE IOHEXOL 300 MG/ML  SOLN COMPARISON:  January 15, 2020 FINDINGS: Lower chest: No acute abnormality. Hepatobiliary: The liver is markedly enlarged. Numerous heterogeneous low-attenuation liver lesions of various sizes are seen. No gallstones, gallbladder wall thickening, or biliary dilatation. Pancreas: A 2.1 cm x 2.6 cm heterogeneous low-attenuation mass is seen at the junction of the pancreatic body and tail (axial CT image 33, CT series number 2). An additional 2.6 cm x 2.9 cm heterogeneous low-attenuation mass is seen within the pancreatic tail (axial CT image 29, CT series number 2). Spleen: Small calcified granulomas are seen scattered throughout the spleen. Adrenals/Urinary Tract: Adrenal glands are unremarkable. Kidneys are normal, without renal calculi, focal lesion, or hydronephrosis. Bladder is unremarkable. Stomach/Bowel: Stomach is within normal limits. Appendix appears normal. No evidence of bowel wall thickening, distention, or inflammatory changes. A large amount of stool is seen throughout the colon. Vascular/Lymphatic: Moderate severity aortic calcification. No enlarged abdominal or pelvic lymph nodes. Reproductive: Prostate is unremarkable. Other: No abdominal wall hernia or abnormality. No abdominopelvic ascites. Musculoskeletal: Sclerotic foci are seen involving the T12, L2, L4 vertebral bodies with additional small sclerotic foci noted within the left iliac bone. The IMPRESSION: 1. Marked severity hepatomegaly with numerous heterogeneous low-attenuation liver lesions of various  sizes, consistent with hepatic metastasis. 2. Findings consistent with osseous metastasis involving the T12, L2, L4 vertebral bodies and left iliac bone. 3. Low-attenuation soft tissue masses within the pancreas which may represent a primary malignancy. Aortic Atherosclerosis (ICD10-I70.0). Electronically Signed   By: Virgina Norfolk M.D.   On: 02/09/2020 20:16   DG Chest Portable 1 View  Result Date: 02/09/2020 CLINICAL DATA:  Fever EXAM: PORTABLE CHEST 1 VIEW COMPARISON:  01/15/2020 FINDINGS: Right Port-A-Cath remains in place, unchanged. Low lung volumes. Right base atelectasis. Left lung clear. Heart is normal size. No effusions or acute bony abnormality. IMPRESSION: Low lung volumes. Right base atelectasis. Findings similar to prior study. Electronically Signed   By: Rolm Baptise M.D.   On: 02/09/2020 21:46     Flora Lipps, MD  [facility] 02/10/2020

## 2020-02-10 NOTE — ED Notes (Signed)
Pt ambulated to restroom without assistance.

## 2020-02-10 NOTE — Progress Notes (Signed)
PT HGB 6.4 PAGED TRH ABOUT SAME. NO INTERVENTIONS AT THIS TIME.

## 2020-02-10 NOTE — Progress Notes (Addendum)
Initial Nutrition Assessment  DOCUMENTATION CODES:   Not applicable  INTERVENTION:  Boost Breeze po TID, each supplement provides 250 kcal and 9 grams of protein  Prostat 30 ml po BID, each supplement provides 100 kcal and 15 grams of protein  NUTRITION DIAGNOSIS:   Increased nutrient needs related to cancer and cancer related treatments as evidenced by estimated needs.    GOAL:   Patient will meet greater than or equal to 90% of their needs    MONITOR:   Labs, I & O's, Supplement acceptance, PO intake, Weight trends  REASON FOR ASSESSMENT:   Malnutrition Screening Tool    ASSESSMENT:  RD working remotely.  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 CT abdomen/pelvis showed marked severity hepatomegaly with numerous liver lesions consistent with hepatic metastasis, osseous metastasis involving T12, L2,L4 vertebral bodies and left iliac bone.  Patient admitted on 4/8 for sepsis in the setting of metastatic neuroendocrine cancer, metastasis to liver and bone.  Patient is on a regular diet, no documented meals for review at this time. Will continue to monitor for po intakes and provide Boost Breeze and Prostat to aid with needs.  Per notes: -urinalysis and CXR without obvious source of infection -black tarry stool reported by nursing staff -GI pathogen panel, stool occult blood pending -1 unit PRBC for Hgb 6.4  Current wt 159.28 lbs Stable 154-165 lbs over the past 4 months I/Os: +3300 ml since admit Medications reviewed and include: IVF: NaCl IVPB: Maxipime, Flagyl, Vacomycin Labs reviewed  NUTRITION - FOCUSED PHYSICAL EXAM: Unable to complete at this time, RD working remotely.  Diet Order:   Diet Order            Diet regular Room service appropriate?  Yes; Fluid consistency: Thin  Diet effective now              EDUCATION NEEDS:   No education needs have been identified at this time  Skin:  Skin Assessment: Reviewed RN Assessment  Last BM:  4/9 type 7  Height:   Ht Readings from Last 1 Encounters:  01/15/20 5\' 7"  (1.702 m)    Weight:   Wt Readings from Last 1 Encounters:  02/06/20 72.4 kg    Ideal Body Weight:     BMI:  There is no height or weight on file to calculate BMI.  Estimated Nutritional Needs:   Kcal:  2175-2300  Protein:  105-115  Fluid:  >/= 2.1 L/day   Lajuan Lines, RD, LDN Clinical Nutrition After Hours/Weekend Pager # in [place]

## 2020-02-10 NOTE — ED Notes (Signed)
Admitting MD Fair, advised she will let this RN know when labs come back if to proceed with antibiotic orders.

## 2020-02-10 NOTE — ED Notes (Signed)
Fair MD advised to proceed with antibiotic orders when pt returns from MRI

## 2020-02-11 DIAGNOSIS — K922 Gastrointestinal hemorrhage, unspecified: Secondary | ICD-10-CM

## 2020-02-11 DIAGNOSIS — A419 Sepsis, unspecified organism: Secondary | ICD-10-CM

## 2020-02-11 LAB — COMPREHENSIVE METABOLIC PANEL
ALT: 22 U/L (ref 0–44)
AST: 34 U/L (ref 15–41)
Albumin: 2.2 g/dL — ABNORMAL LOW (ref 3.5–5.0)
Alkaline Phosphatase: 66 U/L (ref 38–126)
Anion gap: 17 — ABNORMAL HIGH (ref 5–15)
BUN: 32 mg/dL — ABNORMAL HIGH (ref 6–20)
CO2: 11 mmol/L — ABNORMAL LOW (ref 22–32)
Calcium: 7.7 mg/dL — ABNORMAL LOW (ref 8.9–10.3)
Chloride: 107 mmol/L (ref 98–111)
Creatinine, Ser: 1.04 mg/dL (ref 0.61–1.24)
GFR calc Af Amer: >60 mL/min (ref >60)
GFR calc non Af Amer: >60 mL/min (ref >60)
Glucose, Bld: 188 mg/dL — ABNORMAL HIGH (ref 70–99)
Potassium: 4.2 mmol/L (ref 3.5–5.1)
Sodium: 135 mmol/L (ref 135–145)
Total Bilirubin: 1.4 mg/dL — ABNORMAL HIGH (ref 0.3–1.2)
Total Protein: 6.7 g/dL (ref 6.5–8.1)

## 2020-02-11 LAB — C DIFFICILE QUICK SCREEN W PCR REFLEX
C Diff antigen: POSITIVE — AB
C Diff toxin: NEGATIVE

## 2020-02-11 LAB — PREPARE RBC (CROSSMATCH)

## 2020-02-11 LAB — URINE CULTURE: Culture: NO GROWTH

## 2020-02-11 LAB — CBC
HCT: 20.2 % — ABNORMAL LOW (ref 39.0–52.0)
Hemoglobin: 6.1 g/dL — CL (ref 13.0–17.0)
MCH: 28.1 pg (ref 26.0–34.0)
MCHC: 30.2 g/dL (ref 30.0–36.0)
MCV: 93.1 fL (ref 80.0–100.0)
Platelets: 157 10*3/uL (ref 150–400)
RBC: 2.17 MIL/uL — ABNORMAL LOW (ref 4.22–5.81)
RDW: 16.9 % — ABNORMAL HIGH (ref 11.5–15.5)
WBC: 21.5 10*3/uL — ABNORMAL HIGH (ref 4.0–10.5)
nRBC: 0 % (ref 0.0–0.2)

## 2020-02-11 LAB — HEMOGLOBIN AND HEMATOCRIT, BLOOD
HCT: 24.1 % — ABNORMAL LOW (ref 39.0–52.0)
Hemoglobin: 7.7 g/dL — ABNORMAL LOW (ref 13.0–17.0)

## 2020-02-11 LAB — MAGNESIUM: Magnesium: 1.6 mg/dL — ABNORMAL LOW (ref 1.7–2.4)

## 2020-02-11 LAB — PHOSPHORUS: Phosphorus: 2.4 mg/dL — ABNORMAL LOW (ref 2.5–4.6)

## 2020-02-11 MED ORDER — SODIUM CHLORIDE 0.9 % IV SOLN
80.0000 mg | Freq: Once | INTRAVENOUS | Status: AC
  Administered 2020-02-11: 80 mg via INTRAVENOUS
  Filled 2020-02-11: qty 80

## 2020-02-11 MED ORDER — PANTOPRAZOLE SODIUM 40 MG IV SOLR: 40.0000 mg | Freq: Two times a day (BID) | INTRAVENOUS | Status: DC

## 2020-02-11 MED ORDER — SODIUM CHLORIDE 0.9% IV SOLUTION: Freq: Once | INTRAVENOUS | Status: AC

## 2020-02-11 MED ORDER — PROMETHAZINE HCL 25 MG/ML IJ SOLN
25.0000 mg | Freq: Once | INTRAMUSCULAR | Status: AC
  Administered 2020-02-11: 25 mg via INTRAVENOUS

## 2020-02-11 MED ORDER — MAGNESIUM SULFATE 2 GM/50ML IV SOLN
2.0000 g | Freq: Once | INTRAVENOUS | Status: AC
  Administered 2020-02-11: 2 g via INTRAVENOUS
  Filled 2020-02-11: qty 50

## 2020-02-11 MED ORDER — SODIUM CHLORIDE 0.9 % IV SOLN
8.0000 mg/h | INTRAVENOUS | Status: DC
  Administered 2020-02-11 – 2020-02-12 (×2): 8 mg/h via INTRAVENOUS
  Filled 2020-02-11 (×4): qty 80

## 2020-02-11 NOTE — Consult Note (Signed)
Consultation  Referring Provider:     Dr. Louanne Belton Primary Care Physician:  Patient, No Pcp Per Primary Gastroenterologist:        Tressia Labrum Reason for Consultation:     GI bleed         HPI:   [name] is a 59 y.o. 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  Given broad-spectrum antibiotics.  He was also noted to be anemic with a rising BUN.  Once at the hospital he started developing melena.  He has had a few black bowel movements per day.  He had a black bowel movement x1 today.  Denies any pain.  He has been given a few units of blood and after antibiotics feels much better.  He has been having some diarrhea during this course, C. difficile antigen positive, C. difficile toxin negative, C. difficile PCR result is pending at this time.  On admission his hemoglobin was 7.6, down from previous baseline of 8.13 weeks ago.  Today his hemoglobin has decreased to 6.1 and he received 2 units of blood.  Posttransfusion CBC is pending.  He is mildly tachycardic with a heart rate of 102, blood pressure is normal.  He states he is feeling much better at this time.  He was supposed to be taking Eliquis due to history of Covid elevated D-dimer and underlying metastatic malignancy but he actually has not been taking it.  He was treated for gout recently on prednisone and Indocin.  He does not take any GI prophylaxis as an outpatient.  He is currently being treated with cefepime and Flagyl.  Chart says he was on Protonix however I have not seen that in the medical record, do not believe he is received it yet.  While he has been feeling better his white blood cell count has actually increased despite antibiotics.  Platelets and INR are normal  I saw him in 2020 to discuss possible EGD and colonoscopy to clarify  if he had a primary source for his malignancy but after talking to his oncologist Dr. Irene Limbo given the diffuse metastatic nature of his disease this was not pursued.     Past Medical History:  Diagnosis Date  . Arthritis    "knees; left hand"  . Cancer ([place])   . Gout   . Heart murmur    Longstanding. 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 Hypertension   . Near syncope    05/2011 with tachypalpitations & with echo showing mild LVH, normal EF 55-60%, trivial AR    Past Surgical History:  Procedure Laterality Date  . IR IMAGING GUIDED PORT INSERTION  10/26/2018  . NO PAST SURGERIES      Family History  Problem Relation Age of Onset  . Cancer Mother        Possibly pancreatic. Died at age 11  . Prostate cancer Father   . Breast cancer Cousin   . Diabetes Paternal Uncle   . Heart disease Paternal Uncle      Social History   Tobacco Use  . Smoking status: Never Smoker  . Smokeless tobacco: Never Used  Substance Use Topics  . Alcohol use: Not Currently    Comment: states he has cut back/ every now and again  . Drug use: No  Prior to Admission medications   Medication Sig Start Date End Date Taking? Authorizing Provider  apixaban (ELIQUIS) 2.5 MG TABS tablet Take 1 tablet (2.5 mg total) by mouth 2 (two) times daily. 01/19/20 02/18/20 Yes Harold Hedge, MD  ferrous sulfate 325 (65 FE) MG tablet Take 1 tablet (325 mg total) by mouth daily with breakfast. 01/19/20  Yes Harold Hedge, MD  hydrOXYzine (ATARAX/VISTARIL) 25 MG tablet Take 1 tablet (25 mg total) by mouth every 6 (six) hours. 02/06/20  Yes Darr, Marguerita Beards, PA-C  LORazepam (ATIVAN) 0.5 MG tablet Take 1 tablet (0.5 mg total) by mouth every 6 (six) hours as needed (Nausea or vomiting). 01/16/20  Yes Brunetta Genera, MD  ondansetron (ZOFRAN) 8 MG tablet Take 1 tablet (8 mg total) by mouth 2 (two) times daily as needed for refractory nausea / vomiting. Start on day 3 after carboplatin chemo. 10/06/19   Yes Brunetta Genera, MD  oxyCODONE (OXY IR/ROXICODONE) 5 MG immediate release tablet Take 1-2 tablets (5-10 mg total) by mouth every 4 (four) hours as needed for severe pain. 02/08/20  Yes Brunetta Genera, MD  predniSONE (DELTASONE) 20 MG tablet Take 2 tablets (40 mg total) by mouth daily with breakfast for 5 days. 02/06/20 02/11/20 Yes Darr, Marguerita Beards, PA-C  carvedilol (COREG) 6.25 MG tablet Take 1 tablet (6.25 mg total) by mouth 2 (two) times daily with a meal. Patient not taking: Reported on 02/09/2020 01/20/20   Harold Hedge, MD    Current Facility-Administered Medications  Medication Dose Route Frequency Provider Last Rate Last Admin  . ceFEPIme (MAXIPIME) 2 g in sodium chloride 0.9 % 100 mL IVPB  2 g Intravenous Q8H Fair, Marin Shutter, MD 200 mL/hr at 02/11/20 1419 Rate Verify at 02/11/20 1419  . feeding supplement (BOOST / RESOURCE BREEZE) liquid 1 Container  1 Container Oral TID BM Pokhrel, Laxman, MD   1 Container at 02/11/20 1045  . feeding supplement (PRO-STAT SUGAR FREE 64) liquid 30 mL  30 mL Oral BID Pokhrel, Laxman, MD      . HYDROmorphone (DILAUDID) injection 1 mg  1 mg Intravenous Q3H PRN Chauncey Mann, MD   1 mg at 02/11/20 1833  . LORazepam (ATIVAN) tablet 0.5 mg  0.5 mg Oral Q6H PRN Fair, Chelsea N, MD      . metroNIDAZOLE (FLAGYL) IVPB 500 mg  500 mg Intravenous Q8H Fair, Marin Shutter, MD 100 mL/hr at 02/11/20 1707 Rate Verify at 02/11/20 1707  . ondansetron (ZOFRAN) injection 4 mg  4 mg Intravenous Q6H PRN Chauncey Mann, MD   4 mg at 02/11/20 0516  . oxyCODONE (Oxy IR/ROXICODONE) immediate release tablet 5-10 mg  5-10 mg Oral Q4H PRN Pokhrel, Laxman, MD   10 mg at 02/11/20 1432  . pantoprazole (PROTONIX) 80 mg in sodium chloride 0.9 % 100 mL (0.8 mg/mL) infusion  8 mg/hr Intravenous Continuous Latesa Fratto, Carlota Raspberry, MD      . pantoprazole (PROTONIX) 80 mg in sodium chloride 0.9 % 100 mL IVPB  80 mg Intravenous Once Yetta Flock, MD      . Derrill Memo ON 02/15/2020]  pantoprazole (PROTONIX) injection 40 mg  40 mg Intravenous Q12H Yetta Flock, MD      . vancomycin Alcus Dad) IVPB 1750 mg/350 mL  1,750 mg Intravenous Q24H Chauncey Mann, MD   Stopped at 02/10/20 2149   Facility-Administered Medications Ordered in Other Encounters  Medication Dose Route Frequency Provider Last Rate Last Admin  . denosumab (  XGEVA) injection 120 mg  120 mg Subcutaneous Once Brunetta Genera, MD        Allergies as of 02/09/2020 - Review Complete 02/09/2020  Allergen Reaction Noted  . Remdesivir Other (See Comments) 01/17/2020     Review of Systems:    As per HPI, otherwise negative    Physical Exam:  Vital signs in last 24 hours: Temp:  [97.3 F (36.3 C)-98.3 F (36.8 C)] 98.2 F (36.8 C) (04/10 1808) Pulse Rate:  [102-118] 102 (04/10 1808) Resp:  [14-20] 15 (04/10 1808) BP: (148-167)/(68-79) 162/74 (04/10 1808) SpO2:  [100 %] 100 % (04/10 1808)   General:   Pleasant male in NAD Head:  Normocephalic and atraumatic. Eyes:   No icterus.   Conjunctiva pale Ears:  Normal auditory acuity. Neck:  Supple Lungs:  Respirations even and unlabored.  Heart:  Mild tachy Abdomen:  Soft, (+) hepatomegaly,  nondistended, nontender.  Rectal:  Not performed.  Msk:  Symmetrical without gross deformities.  Extremities:  Without edema. Neurologic:  Alert and  oriented x4;  grossly normal neurologically. Skin:  Intact without significant lesions or rashes. Psych:  Alert and cooperative. Normal affect.  LAB RESULTS: Recent Labs    02/09/20 1543 02/10/20 0237 02/11/20 0637  WBC 14.6* 11.0* 21.5*  HGB 7.6* 6.4* 6.1*  HCT 25.4* 20.9* 20.2*  PLT 163 160 157   BMET Recent Labs    02/09/20 1543 02/10/20 0237 02/11/20 0637  NA 135 135 135  K 4.3 4.2 4.2  CL 104 105 107  CO2 21* 17* 11*  GLUCOSE 149* 142* 188*  BUN 38* 37* 32*  CREATININE 1.09 1.00 1.04  CALCIUM 8.9 8.2* 7.7*   LFT Recent Labs    02/11/20 0637  PROT 6.7  ALBUMIN 2.2*  AST  34  ALT 22  ALKPHOS 66  BILITOT 1.4*   PT/INR Recent Labs    02/09/20 2150  LABPROT 10.5*  INR 0.8    STUDIES: MR THORACIC SPINE W WO CONTRAST  Result Date: 02/10/2020 CLINICAL DATA:  Initial evaluation for lower back pain with elevated lactic acid, evaluate for spinal infection. History of metastatic neuroendocrine tumor. EXAM: MRI THORACIC AND LUMBAR SPINE WITHOUT AND WITH CONTRAST TECHNIQUE: Multiplanar and multiecho pulse sequences of the thoracic and lumbar spine were obtained without and with intravenous contrast. CONTRAST:  7.15mL GADAVIST GADOBUTROL 1 MMOL/ML IV SOLN COMPARISON:  Prior CT from earlier the same day as well as previous PET-CT from 10/15/2018. FINDINGS: MRI THORACIC SPINE FINDINGS Alignment: Physiologic with preservation of the normal thoracic kyphosis. No listhesis. Vertebrae: Multiple scattered osseous metastases seen throughout the thoracic spine. These are seen at numerous levels, including the T2, T4, T5, T6, T9, and T11 levels. For reference purposes, largest of these lesions is seen at T6, with the T6 vertebral body is largely replaced by tumor. Vertebral body height maintained without associated pathologic fracture. There is associated epidural extension with tumor seen extending into the ventral epidural space at the levels of T6 (series 29, image 19), and T11 (series 29, image 43). Associated mild to moderate spinal stenosis at these levels, most pronounced at T11. No other significant epidural or extraosseous tumor. Underlying bone marrow signal intensity diffusely decreased on T1 weighted sequence, likely related to history of malignancy and post treatment changes. No findings to suggest osteomyelitis discitis or septic arthritis. Cord: Signal intensity within the thoracic spinal cord is within normal limits. No epidural abscess or other collection. Paraspinal and other soft tissues: Paraspinous soft tissues demonstrate  no acute finding. Minimal atelectatic changes  noted within the partially visualized lungs. Partially visualized liver is markedly enlarged with innumerable metastatic lesions, better seen on prior CT. Disc levels: T4-5: Small central disc protrusion mildly indents the ventral thecal sac. No significant stenosis. T8-9: Mild disc bulge, eccentric to the left. No significant stenosis. No other significant disc pathology seen within the thoracic spine. No other significant stenosis. MRI LUMBAR SPINE FINDINGS Segmentation: Transitional lumbosacral anatomy with sacralized L5 vertebral body. Last well-formed disc space labeled L4-5 on this exam. Alignment: Physiologic with preservation of the normal lumbar lordosis. No listhesis. Vertebrae: Multiple scattered osseous metastases seen throughout the lumbar spine, specifically involving the L1 and L3 vertebral bodies. For reference purposes, the largest lesion is seen at L3 and measures 2.4 cm in size. Probable few additional lesions noted within the partially visualized pelvis. No extra osseous or epidural tumor within the lumbar spine. Underlying bone marrow signal intensity diffusely decreased on T1 weighted imaging. No evidence for osteomyelitis discitis or septic arthritis. Conus medullaris: Extends to the T12-L1 level and appears normal. Paraspinal and other soft tissues: Paraspinous soft tissues demonstrate no acute finding. Liver is markedly enlarged with innumerable metastases, better seen on prior CT. Disc levels: Diffuse congenital shortening of the pedicles noted. L1-2:  Negative interspace.  Mild facet hypertrophy.  No stenosis. L2-3: Chronic intervertebral disc space narrowing with diffuse disc bulge and disc desiccation. Mild to moderate facet hypertrophy. Changes superimposed on short pedicles resultant moderate spinal stenosis. Mild bilateral foraminal narrowing. L3-4: Mild diffuse disc bulge with disc desiccation, asymmetric to the left. Mild to moderate facet hypertrophy. Trace bilateral joint  effusions. Mild epidural lipomatosis. Changes superimposed on short pedicles resultant moderate spinal stenosis. Mild bilateral L3 foraminal narrowing. L4-5: Chronic intervertebral disc space narrowing with diffuse disc bulge and disc desiccation. Superimposed broad-based central disc protrusion with slight superior angulation and annular fissure. Moderate right worse than left facet hypertrophy. Epidural lipomatosis. Changes superimposed on short pedicles resultant mild-to-moderate spinal stenosis. Foramina remain patent. L5-S1: Transitional lumbosacral anatomy with rudimentary L5-S1 disc. No stenosis. IMPRESSION: MRI THORACIC SPINE IMPRESSION 1. No MRI evidence for acute infection within the thoracic spine. 2. Multiple osseous metastatic lesions involving the thoracic spine as above, most pronounced at T6 and T11 where there is associated extension of tumor into the adjacent ventral epidural space. Resultant mild to moderate spinal stenosis at these levels, most pronounced at T11. 3. Marked hepatomegaly with innumerable hepatic metastases, better seen on prior abdominal CT. MRI LUMBAR SPINE IMPRESSION 1. No MRI evidence for acute infection within the lumbar spine. 2. Osseous metastatic lesions involving the lumbar spine at the L1 and L3 levels. No extra osseous or epidural tumor identified. 3. 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 4. Transitional lumbosacral anatomy. Careful correlation to numbering system on this exam recommended prior to any potential future intervention. Electronically Signed   By: Jeannine Boga M.D.   On: 02/10/2020 00:23   MR Lumbar Spine W Wo Contrast  Result Date: 02/10/2020 CLINICAL DATA:  Initial evaluation for lower back pain with elevated lactic acid, evaluate for spinal infection. History of metastatic neuroendocrine tumor. EXAM: MRI THORACIC AND LUMBAR SPINE WITHOUT AND WITH CONTRAST TECHNIQUE: Multiplanar  and multiecho pulse sequences of the thoracic and lumbar spine were obtained without and with intravenous contrast. CONTRAST:  7.30mL GADAVIST GADOBUTROL 1 MMOL/ML IV SOLN COMPARISON:  Prior CT from earlier the same day as well as previous PET-CT from  10/15/2018. FINDINGS: MRI THORACIC SPINE FINDINGS Alignment: Physiologic with preservation of the normal thoracic kyphosis. No listhesis. Vertebrae: Multiple scattered osseous metastases seen throughout the thoracic spine. These are seen at numerous levels, including the T2, T4, T5, T6, T9, and T11 levels. For reference purposes, largest of these lesions is seen at T6, with the T6 vertebral body is largely replaced by tumor. Vertebral body height maintained without associated pathologic fracture. There is associated epidural extension with tumor seen extending into the ventral epidural space at the levels of T6 (series 29, image 19), and T11 (series 29, image 43). Associated mild to moderate spinal stenosis at these levels, most pronounced at T11. No other significant epidural or extraosseous tumor. Underlying bone marrow signal intensity diffusely decreased on T1 weighted sequence, likely related to history of malignancy and post treatment changes. No findings to suggest osteomyelitis discitis or septic arthritis. Cord: Signal intensity within the thoracic spinal cord is within normal limits. No epidural abscess or other collection. Paraspinal and other soft tissues: Paraspinous soft tissues demonstrate no acute finding. Minimal atelectatic changes noted within the partially visualized lungs. Partially visualized liver is markedly enlarged with innumerable metastatic lesions, better seen on prior CT. Disc levels: T4-5: Small central disc protrusion mildly indents the ventral thecal sac. No significant stenosis. T8-9: Mild disc bulge, eccentric to the left. No significant stenosis. No other significant disc pathology seen within the thoracic spine. No other significant  stenosis. MRI LUMBAR SPINE FINDINGS Segmentation: Transitional lumbosacral anatomy with sacralized L5 vertebral body. Last well-formed disc space labeled L4-5 on this exam. Alignment: Physiologic with preservation of the normal lumbar lordosis. No listhesis. Vertebrae: Multiple scattered osseous metastases seen throughout the lumbar spine, specifically involving the L1 and L3 vertebral bodies. For reference purposes, the largest lesion is seen at L3 and measures 2.4 cm in size. Probable few additional lesions noted within the partially visualized pelvis. No extra osseous or epidural tumor within the lumbar spine. Underlying bone marrow signal intensity diffusely decreased on T1 weighted imaging. No evidence for osteomyelitis discitis or septic arthritis. Conus medullaris: Extends to the T12-L1 level and appears normal. Paraspinal and other soft tissues: Paraspinous soft tissues demonstrate no acute finding. Liver is markedly enlarged with innumerable metastases, better seen on prior CT. Disc levels: Diffuse congenital shortening of the pedicles noted. L1-2:  Negative interspace.  Mild facet hypertrophy.  No stenosis. L2-3: Chronic intervertebral disc space narrowing with diffuse disc bulge and disc desiccation. Mild to moderate facet hypertrophy. Changes superimposed on short pedicles resultant moderate spinal stenosis. Mild bilateral foraminal narrowing. L3-4: Mild diffuse disc bulge with disc desiccation, asymmetric to the left. Mild to moderate facet hypertrophy. Trace bilateral joint effusions. Mild epidural lipomatosis. Changes superimposed on short pedicles resultant moderate spinal stenosis. Mild bilateral L3 foraminal narrowing. L4-5: Chronic intervertebral disc space narrowing with diffuse disc bulge and disc desiccation. Superimposed broad-based central disc protrusion with slight superior angulation and annular fissure. Moderate right worse than left facet hypertrophy. Epidural lipomatosis. Changes  superimposed on short pedicles resultant mild-to-moderate spinal stenosis. Foramina remain patent. L5-S1: Transitional lumbosacral anatomy with rudimentary L5-S1 disc. No stenosis. IMPRESSION: MRI THORACIC SPINE IMPRESSION 1. No MRI evidence for acute infection within the thoracic spine. 2. Multiple osseous metastatic lesions involving the thoracic spine as above, most pronounced at T6 and T11 where there is associated extension of tumor into the adjacent ventral epidural space. Resultant mild to moderate spinal stenosis at these levels, most pronounced at T11. 3. Marked hepatomegaly with innumerable hepatic metastases, better  seen on prior abdominal CT. MRI LUMBAR SPINE IMPRESSION 1. No MRI evidence for acute infection within the lumbar spine. 2. Osseous metastatic lesions involving the lumbar spine at the L1 and L3 levels. No extra osseous or epidural tumor identified. 3. 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 4. Transitional lumbosacral anatomy. Careful correlation to numbering system on this exam recommended prior to any potential future intervention. Electronically Signed   By: Jeannine Boga M.D.   On: 02/10/2020 00:23   CT ABDOMEN PELVIS W CONTRAST  Result Date: 02/09/2020 CLINICAL DATA:  Vomiting and back pain x4 days. EXAM: CT ABDOMEN AND PELVIS WITH CONTRAST TECHNIQUE: Multidetector CT imaging of the abdomen and pelvis was performed using the standard protocol following bolus administration of intravenous contrast. CONTRAST:  134mL OMNIPAQUE IOHEXOL 300 MG/ML  SOLN COMPARISON:  January 15, 2020 FINDINGS: Lower chest: No acute abnormality. Hepatobiliary: The liver is markedly enlarged. Numerous heterogeneous low-attenuation liver lesions of various sizes are seen. No gallstones, gallbladder wall thickening, or biliary dilatation. Pancreas: A 2.1 cm x 2.6 cm heterogeneous low-attenuation mass is seen at the junction of the pancreatic  body and tail (axial CT image 33, CT series number 2). An additional 2.6 cm x 2.9 cm heterogeneous low-attenuation mass is seen within the pancreatic tail (axial CT image 29, CT series number 2). Spleen: Small calcified granulomas are seen scattered throughout the spleen. Adrenals/Urinary Tract: Adrenal glands are unremarkable. Kidneys are normal, without renal calculi, focal lesion, or hydronephrosis. Bladder is unremarkable. Stomach/Bowel: Stomach is within normal limits. Appendix appears normal. No evidence of bowel wall thickening, distention, or inflammatory changes. A large amount of stool is seen throughout the colon. Vascular/Lymphatic: Moderate severity aortic calcification. No enlarged abdominal or pelvic lymph nodes. Reproductive: Prostate is unremarkable. Other: No abdominal wall hernia or abnormality. No abdominopelvic ascites. Musculoskeletal: Sclerotic foci are seen involving the T12, L2, L4 vertebral bodies with additional small sclerotic foci noted within the left iliac bone. The IMPRESSION: 1. Marked severity hepatomegaly with numerous heterogeneous low-attenuation liver lesions of various sizes, consistent with hepatic metastasis. 2. Findings consistent with osseous metastasis involving the T12, L2, L4 vertebral bodies and left iliac bone. 3. Low-attenuation soft tissue masses within the pancreas which may represent a primary malignancy. Aortic Atherosclerosis (ICD10-I70.0). Electronically Signed   By: Virgina Norfolk M.D.   On: 02/09/2020 20:16   DG Chest Portable 1 View  Result Date: 02/09/2020 CLINICAL DATA:  Fever EXAM: PORTABLE CHEST 1 VIEW COMPARISON:  01/15/2020 FINDINGS: Right Port-A-Cath remains in place, unchanged. Low lung volumes. Right base atelectasis. Left lung clear. Heart is normal size. No effusions or acute bony abnormality. IMPRESSION: Low lung volumes. Right base atelectasis. Findings similar to prior study. Electronically Signed   By: Rolm Baptise M.D.   On: 02/09/2020  21:46     Impression / Plan:   59 year old male with a history of metastatic neuroendocrine small cell carcinoma to bone and liver, initially admitted for concern of sepsis with fever and elevated white blood cell count.  On broad-spectrum antibiotics.  He has had some diarrhea and C. difficile antigen positive with toxin negative which just returned a few hours ago, PCR pending.  During this time he has had worsening anemia and developed melena during his hospitalization with rising BUN. He is clearly having an upper GI bleed, received another unit of blood and awaiting posttransfusion H&H.  Vitals relatively stable and he states he is feeling much better than what  he did on admission.  Discussed the situation with the patient.  I am recommending an upper endoscopy to evaluate his GI bleeding, and after discussion of risks and benefits and wanted proceed with that.  Will await his posttransfusion H&H tonight and give more blood if needed.  He looks stable at this time and I think can wait until tomorrow for endoscopy.  We will keep him n.p.o. after midnight in clear liquid diet until then.  In the chart he was supposed to have been on IV Protonix but I do not see that he ever received a dose of it.  I will give him a loading dose and then put him on a Protonix drip.  His platelets and INR are normal.  Otherwise sepsis of unclear etiology so far and with rising white blood cell count.  Awaiting C. difficile PCR, antigen positive but toxin negative just returned, C. difficile colitis could account for this, hopefully this result is back tonight or tomorrow.  Will follow and plan for EGD tomorrow if stable, call with questions.  Aldrich Cellar, MD [facility]

## 2020-02-11 NOTE — Anesthesia Preprocedure Evaluation (Addendum)
Anesthesia Evaluation  Patient identified by MRN, date of birth, ID band Patient awake    Reviewed: Allergy & Precautions, NPO status , Patient's Chart, lab work & pertinent test results  History of Anesthesia Complications Negative for: history of anesthetic complications  Airway Mallampati: II  TM Distance: >3 FB Neck ROM: Full    Dental  (+) Poor Dentition, Dental Advisory Given   Pulmonary neg pulmonary ROS,    Pulmonary exam normal        Cardiovascular hypertension, Pt. on medications  Rhythm:Regular Rate:Tachycardia     Neuro/Psych negative neurological ROS     GI/Hepatic Neg liver ROS, Liver mets   Endo/Other  negative endocrine ROS  Renal/GU negative Renal ROS     Musculoskeletal negative musculoskeletal ROS (+) Bone Mets   Abdominal   Peds  Hematology negative hematology ROS (+) anemia ,   Anesthesia Other Findings Day of surgery medications reviewed with the patient.  Reproductive/Obstetrics                            Anesthesia Physical Anesthesia Plan  ASA: III  Anesthesia Plan: MAC   Post-op Pain Management:    Induction: Intravenous  PONV Risk Score and Plan: Ondansetron and Propofol infusion  Airway Management Planned: Natural Airway, Simple Face Mask and Nasal Cannula  Additional Equipment:   Intra-op Plan:   Post-operative Plan:   Informed Consent: I have reviewed the patients History and Physical, chart, labs and discussed the procedure including the risks, benefits and alternatives for the proposed anesthesia with the patient or authorized representative who has indicated his/her understanding and acceptance.     Dental advisory given  Plan Discussed with: Anesthesiologist, CRNA and Surgeon  Anesthesia Plan Comments:        Anesthesia Quick Evaluation

## 2020-02-11 NOTE — Progress Notes (Addendum)
PROGRESS NOTE  [name] YQM:[id number] DOB: [birth year] DOA: 02/09/2020 PCP: Patient, No Pcp Per   LOS: 2 days   Brief narrative: As per HPI,  [name] is a 59 y.o. 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 Patient reports he was diagnosed with liver cancer in Nov 2019. He has been following with Dr. Irene Limbo since that time and completed chemotherapy. Mets to back was already a suspicion. Over the last couple days reports vague abdominal and back pain.  He also reported nausea and vomiting but has been having multiple episodes of loose stools as well.    In the ED:  Patient was tachycardic, febrile but overall stable on room air. Labs remarkable for WBC 14.6, Hgb 7.6 and Lactate 3.7. Lipase, CMP and UA WNL. CXR: Low lung volumes. Right base atelectasis. Findings similar to prior study.  CT scan of the abdomen pelvis showed marked severity hepatomegaly with numerous heterogeneous low-attenuation liver lesions of various sizes, consistent with hepatic metastasis.. Findings consistent with osseous metastasis involving the T12, L2, L4 vertebral bodies and left iliac bone. Low-attenuation soft tissue masses within the pancreas which may represent a primary malignancy. Patient was given Zofran, Dilaudid and IVF.   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  Urinalysis and chest x-ray without any obvious source of infection.   Negative blood cultures, urine cultures so far.  Vancomycin, cefepime and Flagyl was initiated patient received 1 L of IV fluid.  Patient was last seen by Dr. Irene Limbo on 12/19/2019.  Latest lactate of 3.5 and is on Normal saline.   Patient with history of recent COVID-19 infection.  Nausea, vomiting diarrhea, abdominal pain, back pain.  Likely secondary to metastatic cancer. On oxycodone po and iv dilaudid for severe pain.  Significant anemia likely secondary to acute blood loss from melena..  Hemoglobin of 6.4 on presentation. Received 1 unit of packed RBC.  Hemoglobin of 6.1 at this time.  Will transfuse 2 more units of packed RBC.  Patient does have ongoing melena.  Spoke with GI for consultation. Patient was supposed to take Eliquis as per last admission due to history of Covid elevated D-dimer and underlying metastatic malignancy but has not been taking.  Continue Protonix.  Hypomagnesemia.  Will give IV magnesium sulfate.  Gout Hold prednisone for now.  No acute flare..  Essential HTN Supposed to be on Coreg but not taking it due to some kind of side effect.    VTE Prophylaxis:  SCD due to GI bleed.  Code Status: Full code  Family Communication:  I spoke with the patient's spouse on the phone and updated her about the clinical condition of the patient.   Disposition Plan:  . Patient is from home . Likely disposition to home likely in 2 to 3 days . Barriers to discharge: ongoing GI bleed, possible sepsis undergoing work-up, blood transfusion, GI consultation and likely endoscopic evaluation.  Consultants:  GI  Procedures:  Transfusion of packed RBC  Antibiotics:  .  Vancomycin, metronidazole and cefepime  Anti-infectives (From admission, onward)   Start     Dose/Rate Route Frequency Ordered Stop   02/10/20 2000  vancomycin (VANCOREADY) IVPB 1750 mg/350 mL     1,750 mg 175 mL/hr over 120 Minutes Intravenous Every 24 hours 02/10/20 0355     02/10/20 0800  metroNIDAZOLE  (FLAGYL) IVPB 500 mg     500 mg 100 mL/hr over 60 Minutes Intravenous Every 8 hours 02/10/20 0118     02/10/20 0600  ceFEPIme (MAXIPIME) 2 g in sodium chloride 0.9 % 100 mL IVPB     2 g 200 mL/hr over 30 Minutes Intravenous Every 8 hours 02/10/20 0355     02/09/20 2200  vancomycin (VANCOREADY) IVPB 1500 mg/300 mL     1,500 mg 150 mL/hr over 120 Minutes Intravenous  Once 02/09/20 2128 02/10/20 0310   02/09/20 2130  ceFEPIme (MAXIPIME) 2 g in sodium chloride 0.9 % 100 mL IVPB     2 g 200 mL/hr over 30 Minutes Intravenous  Once 02/09/20 2120 02/10/20 0032   02/09/20 2130  metroNIDAZOLE (FLAGYL) IVPB 500 mg     500 mg 100 mL/hr over 60 Minutes Intravenous  Once 02/09/20 2120 02/10/20 0106   02/09/20 2130  vancomycin (VANCOCIN) IVPB 1000 mg/200 mL premix  Status:  Discontinued     1,000 mg 200 mL/hr over 60 Minutes Intravenous  Once 02/09/20 2120 02/09/20 2128     Subjective: Today, patient was seen and examined at bedside.  Patient denies any chest pain, shortness of breath but has been having melanotic stools.  Complains of mild abdominal and back pain.    Objective: Vitals:   02/11/20 1014 02/11/20 1054  BP: (!) 149/79 (!) 156/68  Pulse: (!) 116 (!) 115  Resp: 20 18  Temp: 97.7 F (36.5 C) 97.6 F (36.4 C)  SpO2: 100% 100%    Intake/Output Summary (Last 24 hours) at 02/11/2020 1343 Last data filed at 02/11/2020 0300 Gross per 24 hour  Intake 1745.95 ml  Output --  Net 1745.95 ml   There were no vitals filed for this visit. There is no height or weight on file to calculate BMI.   Physical Exam:  GENERAL: Patient is alert awake and communicative not in obvious distress. HENT: Mild pallor noted.  Pupils equally reactive to light. Oral mucosa is moist NECK: is supple, no gross swelling noted. CHEST: Clear to auscultation. No crackles or wheezes.  Diminished breath sounds bilaterally. CVS: S1 and S2 heard, no murmur. Regular rate and rhythm.  Nonspecific tenderness on  palpation of the back. ABDOMEN: Soft, tenderness over epigastric region on palpation with palpation of abdominal mass.  Bowel sounds are present. EXTREMITIES: Bilateral lower extremity edema. CNS: Cranial nerves are intact. No focal motor deficits. SKIN: warm and dry without rashes.  Data Review: I have personally reviewed the following laboratory data and studies,  CBC: Recent Labs  Lab 02/09/20 1543 02/10/20 0237 02/11/20 0637  WBC 14.6* 11.0* 21.5*  HGB 7.6* 6.4* 6.1*  HCT 25.4* 20.9* 20.2*  MCV 91.7 92.5 93.1  PLT 163 160 829   Basic Metabolic Panel: Recent Labs  Lab 02/09/20 1543 02/10/20 0237 02/11/20 0637  NA 135 135 135  K 4.3 4.2 4.2  CL 104 105 107  CO2 21* 17* 11*  GLUCOSE 149* 142* 188*  BUN 38* 37* 32*  CREATININE 1.09 1.00 1.04  CALCIUM 8.9 8.2* 7.7*  MG  --   --  1.6*  PHOS  --   --  2.4*   Liver Function Tests: Recent Labs  Lab 02/09/20 1543 02/10/20 0237 02/11/20 0637  AST 31 31 34  ALT 23 23 22   ALKPHOS 99 89 66  BILITOT 1.3* 1.3* 1.4*  PROT 8.5* 7.5 6.7  ALBUMIN 3.0* 2.6* 2.2*   Recent Labs  Lab 02/09/20 1543  LIPASE 21   No results for input(s): AMMONIA in the last 168 hours. Cardiac Enzymes: No results for input(s): CKTOTAL, CKMB, CKMBINDEX, TROPONINI in the last 168 hours. BNP (last 3 results) Recent Labs    05/10/19 0545 01/16/20 0405  BNP 40.1 81.9    ProBNP (last 3 results) No results for input(s): PROBNP in the last 8760 hours.  CBG: No results for input(s): GLUCAP in the last 168 hours. Recent Results (from the past 240 hour(s))  Urine culture     Status: None   Collection Time: 02/09/20  7:48 PM   Specimen: Urine, Clean Catch  Result Value Ref Range Status   Specimen Description   Final    URINE, CLEAN CATCH Performed at [facility], [place] [address] [place], [place] [address]    Special Requests   Final    NONE Performed at [facility], [place] [address],  [place], [place] [address]    Culture   Final    NO GROWTH Performed at [facility], [place] [address], [place], [place] [address]    Report Status 02/11/2020 FINAL  Final  Culture, blood (routine x 2)     Status: None (Preliminary result)   Collection Time: 02/09/20  9:04 PM   Specimen: BLOOD  Result Value Ref Range Status   Specimen Description   Final    BLOOD BLOOD LEFT FOREARM Performed at [facility] [address], [place], [place] [address]    Special Requests   Final    BOTTLES DRAWN AEROBIC AND ANAEROBIC Blood Culture results may not be optimal due to an excessive volume of blood received in culture bottles Performed at [facility] [address] [place], [place] [address]    Culture   Final    NO GROWTH 1 DAY Performed at [facility], [place] [address] [place], [place] [address]    Report Status PENDING  Incomplete  Culture, blood (routine x 2)     Status: None (Preliminary result)   Collection Time: 02/09/20  9:45 PM   Specimen: BLOOD LEFT FOREARM  Result Value Ref Range Status   Specimen Description   Final    BLOOD LEFT FOREARM Performed at [facility] [address], [place], [place] [address]    Special Requests   Final    BOTTLES DRAWN AEROBIC AND ANAEROBIC Blood Culture results may not be optimal due to an excessive volume of blood received in culture bottles Performed at [facility] [address], [place], [place] [address]    Culture   Final    NO GROWTH 1 DAY Performed at [facility], [place] [address] [place],  [address]    Report Status PENDING  Incomplete  SARS CORONAVIRUS 2 (TAT 6-24 HRS) Nasopharyngeal Nasopharyngeal Swab     Status: None   Collection Time: 02/10/20 12:10 AM   Specimen: Nasopharyngeal Swab  Result Value Ref Range Status   SARS Coronavirus 2 NEGATIVE NEGATIVE Final    Comment: (NOTE) SARS-CoV-2 target nucleic acids are NOT  DETECTED. The SARS-CoV-2 RNA is generally detectable in upper and lower  respiratory specimens during the acute phase of infection. Negative results do not preclude SARS-CoV-2 infection, do not rule out co-infections with other pathogens, and should not be used as the sole basis for treatment or other patient management decisions. Negative results must be combined with clinical observations, patient history, and epidemiological information. The expected result is Negative. Fact Sheet for Patients: SugarRoll.be Fact Sheet for Healthcare Providers: https://www.woods-mathews.com/ This test is not yet approved or cleared by the [place] FDA and  has been authorized for detection and/or diagnosis of SARS-CoV-2 by FDA under an Emergency Use Authorization (EUA). This EUA will remain  in effect (meaning this test can be used) for the duration of the COVID-19 declaration under Section 56 4(b)(1) of the Act, 21 U.S.C. section 360bbb-3(b)(1), unless the authorization is terminated or revoked sooner. Performed at [facility], [place] [address] [place], [place] [address]      Studies: MR THORACIC SPINE W WO CONTRAST  Result Date: 02/10/2020 CLINICAL DATA:  Initial evaluation for lower back pain with elevated lactic acid, evaluate for spinal infection. History of metastatic neuroendocrine tumor. EXAM: MRI THORACIC AND LUMBAR SPINE WITHOUT AND WITH CONTRAST TECHNIQUE: Multiplanar and multiecho pulse sequences of the thoracic and lumbar spine were obtained without and with intravenous contrast. CONTRAST:  7.72mL GADAVIST GADOBUTROL 1 MMOL/ML IV SOLN COMPARISON:  Prior CT from earlier the same day as well as previous PET-CT from 10/15/2018. FINDINGS: MRI THORACIC SPINE FINDINGS Alignment: Physiologic with preservation of the normal thoracic kyphosis. No listhesis. Vertebrae: Multiple scattered osseous metastases seen throughout the thoracic spine. These are  seen at numerous levels, including the T2, T4, T5, T6, T9, and T11 levels. For reference purposes, largest of these lesions is seen at T6, with the T6 vertebral body is largely replaced by tumor. Vertebral body height maintained without associated pathologic fracture. There is associated epidural extension with tumor seen extending into the ventral epidural space at the levels of T6 (series 29, image 19), and T11 (series 29, image 43). Associated mild to moderate spinal stenosis at these levels, most pronounced at T11. No other significant epidural or extraosseous tumor. Underlying bone marrow signal intensity diffusely decreased on T1 weighted sequence, likely related to history of malignancy and post treatment changes. No findings to suggest osteomyelitis discitis or septic arthritis. Cord: Signal intensity within the thoracic spinal cord is within normal limits. No epidural abscess or other collection. Paraspinal and other soft tissues: Paraspinous soft tissues demonstrate no acute finding. Minimal atelectatic changes noted within the partially visualized lungs. Partially visualized liver is markedly enlarged with innumerable metastatic lesions, better seen on prior CT. Disc levels: T4-5: Small central disc protrusion mildly indents the ventral thecal sac. No significant stenosis. T8-9: Mild disc bulge, eccentric to the left. No significant stenosis. No other significant disc pathology seen within the thoracic spine. No other significant stenosis. MRI LUMBAR SPINE FINDINGS Segmentation: Transitional lumbosacral anatomy with sacralized L5 vertebral body. Last well-formed disc space labeled L4-5 on this exam. Alignment: Physiologic with preservation of the normal lumbar lordosis. No listhesis. Vertebrae: Multiple scattered osseous metastases seen throughout the lumbar spine, specifically involving the L1 and L3 vertebral bodies. For reference purposes, the largest lesion is seen at L3 and measures 2.4 cm in size.  Probable few additional lesions noted within the partially visualized pelvis. No extra osseous or epidural tumor within the lumbar spine. Underlying bone marrow signal intensity diffusely decreased on T1 weighted imaging. No evidence for osteomyelitis discitis or septic arthritis. Conus medullaris: Extends  to the T12-L1 level and appears normal. Paraspinal and other soft tissues: Paraspinous soft tissues demonstrate no acute finding. Liver is markedly enlarged with innumerable metastases, better seen on prior CT. Disc levels: Diffuse congenital shortening of the pedicles noted. L1-2:  Negative interspace.  Mild facet hypertrophy.  No stenosis. L2-3: Chronic intervertebral disc space narrowing with diffuse disc bulge and disc desiccation. Mild to moderate facet hypertrophy. Changes superimposed on short pedicles resultant moderate spinal stenosis. Mild bilateral foraminal narrowing. L3-4: Mild diffuse disc bulge with disc desiccation, asymmetric to the left. Mild to moderate facet hypertrophy. Trace bilateral joint effusions. Mild epidural lipomatosis. Changes superimposed on short pedicles resultant moderate spinal stenosis. Mild bilateral L3 foraminal narrowing. L4-5: Chronic intervertebral disc space narrowing with diffuse disc bulge and disc desiccation. Superimposed broad-based central disc protrusion with slight superior angulation and annular fissure. Moderate right worse than left facet hypertrophy. Epidural lipomatosis. Changes superimposed on short pedicles resultant mild-to-moderate spinal stenosis. Foramina remain patent. L5-S1: Transitional lumbosacral anatomy with rudimentary L5-S1 disc. No stenosis. IMPRESSION: MRI THORACIC SPINE IMPRESSION 1. No MRI evidence for acute infection within the thoracic spine. 2. Multiple osseous metastatic lesions involving the thoracic spine as above, most pronounced at T6 and T11 where there is associated extension of tumor into the adjacent ventral epidural space.  Resultant mild to moderate spinal stenosis at these levels, most pronounced at T11. 3. Marked hepatomegaly with innumerable hepatic metastases, better seen on prior abdominal CT. MRI LUMBAR SPINE IMPRESSION 1. No MRI evidence for acute infection within the lumbar spine. 2. Osseous metastatic lesions involving the lumbar spine at the L1 and L3 levels. No extra osseous or epidural tumor identified. 3. 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 4. Transitional lumbosacral anatomy. Careful correlation to numbering system on this exam recommended prior to any potential future intervention. Electronically Signed   By: Jeannine Boga M.D.   On: 02/10/2020 00:23   MR Lumbar Spine W Wo Contrast  Result Date: 02/10/2020 CLINICAL DATA:  Initial evaluation for lower back pain with elevated lactic acid, evaluate for spinal infection. History of metastatic neuroendocrine tumor. EXAM: MRI THORACIC AND LUMBAR SPINE WITHOUT AND WITH CONTRAST TECHNIQUE: Multiplanar and multiecho pulse sequences of the thoracic and lumbar spine were obtained without and with intravenous contrast. CONTRAST:  7.34mL GADAVIST GADOBUTROL 1 MMOL/ML IV SOLN COMPARISON:  Prior CT from earlier the same day as well as previous PET-CT from 10/15/2018. FINDINGS: MRI THORACIC SPINE FINDINGS Alignment: Physiologic with preservation of the normal thoracic kyphosis. No listhesis. Vertebrae: Multiple scattered osseous metastases seen throughout the thoracic spine. These are seen at numerous levels, including the T2, T4, T5, T6, T9, and T11 levels. For reference purposes, largest of these lesions is seen at T6, with the T6 vertebral body is largely replaced by tumor. Vertebral body height maintained without associated pathologic fracture. There is associated epidural extension with tumor seen extending into the ventral epidural space at the levels of T6 (series 29, image 19), and T11 (series 29,  image 43). Associated mild to moderate spinal stenosis at these levels, most pronounced at T11. No other significant epidural or extraosseous tumor. Underlying bone marrow signal intensity diffusely decreased on T1 weighted sequence, likely related to history of malignancy and post treatment changes. No findings to suggest osteomyelitis discitis or septic arthritis. Cord: Signal intensity within the thoracic spinal cord is within normal limits. No epidural abscess or other collection. Paraspinal and other soft tissues: Paraspinous soft tissues demonstrate  no acute finding. Minimal atelectatic changes noted within the partially visualized lungs. Partially visualized liver is markedly enlarged with innumerable metastatic lesions, better seen on prior CT. Disc levels: T4-5: Small central disc protrusion mildly indents the ventral thecal sac. No significant stenosis. T8-9: Mild disc bulge, eccentric to the left. No significant stenosis. No other significant disc pathology seen within the thoracic spine. No other significant stenosis. MRI LUMBAR SPINE FINDINGS Segmentation: Transitional lumbosacral anatomy with sacralized L5 vertebral body. Last well-formed disc space labeled L4-5 on this exam. Alignment: Physiologic with preservation of the normal lumbar lordosis. No listhesis. Vertebrae: Multiple scattered osseous metastases seen throughout the lumbar spine, specifically involving the L1 and L3 vertebral bodies. For reference purposes, the largest lesion is seen at L3 and measures 2.4 cm in size. Probable few additional lesions noted within the partially visualized pelvis. No extra osseous or epidural tumor within the lumbar spine. Underlying bone marrow signal intensity diffusely decreased on T1 weighted imaging. No evidence for osteomyelitis discitis or septic arthritis. Conus medullaris: Extends to the T12-L1 level and appears normal. Paraspinal and other soft tissues: Paraspinous soft tissues demonstrate no acute  finding. Liver is markedly enlarged with innumerable metastases, better seen on prior CT. Disc levels: Diffuse congenital shortening of the pedicles noted. L1-2:  Negative interspace.  Mild facet hypertrophy.  No stenosis. L2-3: Chronic intervertebral disc space narrowing with diffuse disc bulge and disc desiccation. Mild to moderate facet hypertrophy. Changes superimposed on short pedicles resultant moderate spinal stenosis. Mild bilateral foraminal narrowing. L3-4: Mild diffuse disc bulge with disc desiccation, asymmetric to the left. Mild to moderate facet hypertrophy. Trace bilateral joint effusions. Mild epidural lipomatosis. Changes superimposed on short pedicles resultant moderate spinal stenosis. Mild bilateral L3 foraminal narrowing. L4-5: Chronic intervertebral disc space narrowing with diffuse disc bulge and disc desiccation. Superimposed broad-based central disc protrusion with slight superior angulation and annular fissure. Moderate right worse than left facet hypertrophy. Epidural lipomatosis. Changes superimposed on short pedicles resultant mild-to-moderate spinal stenosis. Foramina remain patent. L5-S1: Transitional lumbosacral anatomy with rudimentary L5-S1 disc. No stenosis. IMPRESSION: MRI THORACIC SPINE IMPRESSION 1. No MRI evidence for acute infection within the thoracic spine. 2. Multiple osseous metastatic lesions involving the thoracic spine as above, most pronounced at T6 and T11 where there is associated extension of tumor into the adjacent ventral epidural space. Resultant mild to moderate spinal stenosis at these levels, most pronounced at T11. 3. Marked hepatomegaly with innumerable hepatic metastases, better seen on prior abdominal CT. MRI LUMBAR SPINE IMPRESSION 1. No MRI evidence for acute infection within the lumbar spine. 2. Osseous metastatic lesions involving the lumbar spine at the L1 and L3 levels. No extra osseous or epidural tumor identified. 3. Acquired on congenital moderate  diffuse spinal stenosis extending from L2-3 through L4-5 as detailed above. 4. Transitional lumbosacral anatomy. Careful correlation to numbering system on this exam recommended prior to any potential future intervention. Electronically Signed   By: Jeannine Boga M.D.   On: 02/10/2020 00:23   CT ABDOMEN PELVIS W CONTRAST  Result Date: 02/09/2020 CLINICAL DATA:  Vomiting and back pain x4 days. EXAM: CT ABDOMEN AND PELVIS WITH CONTRAST TECHNIQUE: Multidetector CT imaging of the abdomen and pelvis was performed using the standard protocol following bolus administration of intravenous contrast. CONTRAST:  117mL OMNIPAQUE IOHEXOL 300 MG/ML  SOLN COMPARISON:  January 15, 2020 FINDINGS: Lower chest: No acute abnormality. Hepatobiliary: The liver is markedly enlarged. Numerous heterogeneous low-attenuation liver lesions of various sizes are seen. No gallstones, gallbladder wall  thickening, or biliary dilatation. Pancreas: A 2.1 cm x 2.6 cm heterogeneous low-attenuation mass is seen at the junction of the pancreatic body and tail (axial CT image 33, CT series number 2). An additional 2.6 cm x 2.9 cm heterogeneous low-attenuation mass is seen within the pancreatic tail (axial CT image 29, CT series number 2). Spleen: Small calcified granulomas are seen scattered throughout the spleen. Adrenals/Urinary Tract: Adrenal glands are unremarkable. Kidneys are normal, without renal calculi, focal lesion, or hydronephrosis. Bladder is unremarkable. Stomach/Bowel: Stomach is within normal limits. Appendix appears normal. No evidence of bowel wall thickening, distention, or inflammatory changes. A large amount of stool is seen throughout the colon. Vascular/Lymphatic: Moderate severity aortic calcification. No enlarged abdominal or pelvic lymph nodes. Reproductive: Prostate is unremarkable. Other: No abdominal wall hernia or abnormality. No abdominopelvic ascites. Musculoskeletal: Sclerotic foci are seen involving the T12, L2,  L4 vertebral bodies with additional small sclerotic foci noted within the left iliac bone. The IMPRESSION: 1. Marked severity hepatomegaly with numerous heterogeneous low-attenuation liver lesions of various sizes, consistent with hepatic metastasis. 2. Findings consistent with osseous metastasis involving the T12, L2, L4 vertebral bodies and left iliac bone. 3. Low-attenuation soft tissue masses within the pancreas which may represent a primary malignancy. Aortic Atherosclerosis (ICD10-I70.0). Electronically Signed   By: Virgina Norfolk M.D.   On: 02/09/2020 20:16   DG Chest Portable 1 View  Result Date: 02/09/2020 CLINICAL DATA:  Fever EXAM: PORTABLE CHEST 1 VIEW COMPARISON:  01/15/2020 FINDINGS: Right Port-A-Cath remains in place, unchanged. Low lung volumes. Right base atelectasis. Left lung clear. Heart is normal size. No effusions or acute bony abnormality. IMPRESSION: Low lung volumes. Right base atelectasis. Findings similar to prior study. Electronically Signed   By: Rolm Baptise M.D.   On: 02/09/2020 21:46     Flora Lipps, MD  [facility] 02/11/2020

## 2020-02-11 NOTE — H&P (View-Only) (Signed)
Consultation  Referring Provider:     Dr. Louanne Belton Primary Care Physician:  Patient, No Pcp Per Primary Gastroenterologist:        Jeannelle Wiens Reason for Consultation:     GI bleed         HPI:   [name] is a 59 y.o. 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  Given broad-spectrum antibiotics.  He was also noted to be anemic with a rising BUN.  Once at the hospital he started developing melena.  He has had a few black bowel movements per day.  He had a black bowel movement x1 today.  Denies any pain.  He has been given a few units of blood and after antibiotics feels much better.  He has been having some diarrhea during this course, C. difficile antigen positive, C. difficile toxin negative, C. difficile PCR result is pending at this time.  On admission his hemoglobin was 7.6, down from previous baseline of 8.13 weeks ago.  Today his hemoglobin has decreased to 6.1 and he received 2 units of blood.  Posttransfusion CBC is pending.  He is mildly tachycardic with a heart rate of 102, blood pressure is normal.  He states he is feeling much better at this time.  He was supposed to be taking Eliquis due to history of Covid elevated D-dimer and underlying metastatic malignancy but he actually has not been taking it.  He was treated for gout recently on prednisone and Indocin.  He does not take any GI prophylaxis as an outpatient.  He is currently being treated with cefepime and Flagyl.  Chart says he was on Protonix however I have not seen that in the medical record, do not believe he is received it yet.  While he has been feeling better his white blood cell count has actually increased despite antibiotics.  Platelets and INR are normal  I saw him in 2020 to discuss possible EGD and colonoscopy to clarify  if he had a primary source for his malignancy but after talking to his oncologist Dr. Irene Limbo given the diffuse metastatic nature of his disease this was not pursued.     Past Medical History:  Diagnosis Date  . Arthritis    "knees; left hand"  . Cancer ([place])   . Gout   . Heart murmur    Longstanding. 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 Hypertension   . Near syncope    05/2011 with tachypalpitations & with echo showing mild LVH, normal EF 55-60%, trivial AR    Past Surgical History:  Procedure Laterality Date  . IR IMAGING GUIDED PORT INSERTION  10/26/2018  . NO PAST SURGERIES      Family History  Problem Relation Age of Onset  . Cancer Mother        Possibly pancreatic. Died at age 45  . Prostate cancer Father   . Breast cancer Cousin   . Diabetes Paternal Uncle   . Heart disease Paternal Uncle      Social History   Tobacco Use  . Smoking status: Never Smoker  . Smokeless tobacco: Never Used  Substance Use Topics  . Alcohol use: Not Currently    Comment: states he has cut back/ every now and again  . Drug use: No  Prior to Admission medications   Medication Sig Start Date End Date Taking? Authorizing Provider  apixaban (ELIQUIS) 2.5 MG TABS tablet Take 1 tablet (2.5 mg total) by mouth 2 (two) times daily. 01/19/20 02/18/20 Yes Harold Hedge, MD  ferrous sulfate 325 (65 FE) MG tablet Take 1 tablet (325 mg total) by mouth daily with breakfast. 01/19/20  Yes Harold Hedge, MD  hydrOXYzine (ATARAX/VISTARIL) 25 MG tablet Take 1 tablet (25 mg total) by mouth every 6 (six) hours. 02/06/20  Yes Darr, Marguerita Beards, PA-C  LORazepam (ATIVAN) 0.5 MG tablet Take 1 tablet (0.5 mg total) by mouth every 6 (six) hours as needed (Nausea or vomiting). 01/16/20  Yes Brunetta Genera, MD  ondansetron (ZOFRAN) 8 MG tablet Take 1 tablet (8 mg total) by mouth 2 (two) times daily as needed for refractory nausea / vomiting. Start on day 3 after carboplatin chemo. 10/06/19   Yes Brunetta Genera, MD  oxyCODONE (OXY IR/ROXICODONE) 5 MG immediate release tablet Take 1-2 tablets (5-10 mg total) by mouth every 4 (four) hours as needed for severe pain. 02/08/20  Yes Brunetta Genera, MD  predniSONE (DELTASONE) 20 MG tablet Take 2 tablets (40 mg total) by mouth daily with breakfast for 5 days. 02/06/20 02/11/20 Yes Darr, Marguerita Beards, PA-C  carvedilol (COREG) 6.25 MG tablet Take 1 tablet (6.25 mg total) by mouth 2 (two) times daily with a meal. Patient not taking: Reported on 02/09/2020 01/20/20   Harold Hedge, MD    Current Facility-Administered Medications  Medication Dose Route Frequency Provider Last Rate Last Admin  . ceFEPIme (MAXIPIME) 2 g in sodium chloride 0.9 % 100 mL IVPB  2 g Intravenous Q8H Fair, Marin Shutter, MD 200 mL/hr at 02/11/20 1419 Rate Verify at 02/11/20 1419  . feeding supplement (BOOST / RESOURCE BREEZE) liquid 1 Container  1 Container Oral TID BM Pokhrel, Laxman, MD   1 Container at 02/11/20 1045  . feeding supplement (PRO-STAT SUGAR FREE 64) liquid 30 mL  30 mL Oral BID Pokhrel, Laxman, MD      . HYDROmorphone (DILAUDID) injection 1 mg  1 mg Intravenous Q3H PRN Chauncey Mann, MD   1 mg at 02/11/20 1833  . LORazepam (ATIVAN) tablet 0.5 mg  0.5 mg Oral Q6H PRN Fair, Chelsea N, MD      . metroNIDAZOLE (FLAGYL) IVPB 500 mg  500 mg Intravenous Q8H Fair, Marin Shutter, MD 100 mL/hr at 02/11/20 1707 Rate Verify at 02/11/20 1707  . ondansetron (ZOFRAN) injection 4 mg  4 mg Intravenous Q6H PRN Chauncey Mann, MD   4 mg at 02/11/20 0516  . oxyCODONE (Oxy IR/ROXICODONE) immediate release tablet 5-10 mg  5-10 mg Oral Q4H PRN Pokhrel, Laxman, MD   10 mg at 02/11/20 1432  . pantoprazole (PROTONIX) 80 mg in sodium chloride 0.9 % 100 mL (0.8 mg/mL) infusion  8 mg/hr Intravenous Continuous Dolorez Jeffrey, Carlota Raspberry, MD      . pantoprazole (PROTONIX) 80 mg in sodium chloride 0.9 % 100 mL IVPB  80 mg Intravenous Once Yetta Flock, MD      . Derrill Memo ON 02/15/2020]  pantoprazole (PROTONIX) injection 40 mg  40 mg Intravenous Q12H Yetta Flock, MD      . vancomycin Alcus Dad) IVPB 1750 mg/350 mL  1,750 mg Intravenous Q24H Chauncey Mann, MD   Stopped at 02/10/20 2149   Facility-Administered Medications Ordered in Other Encounters  Medication Dose Route Frequency Provider Last Rate Last Admin  . denosumab (  XGEVA) injection 120 mg  120 mg Subcutaneous Once Brunetta Genera, MD        Allergies as of 02/09/2020 - Review Complete 02/09/2020  Allergen Reaction Noted  . Remdesivir Other (See Comments) 01/17/2020     Review of Systems:    As per HPI, otherwise negative    Physical Exam:  Vital signs in last 24 hours: Temp:  [97.3 F (36.3 C)-98.3 F (36.8 C)] 98.2 F (36.8 C) (04/10 1808) Pulse Rate:  [102-118] 102 (04/10 1808) Resp:  [14-20] 15 (04/10 1808) BP: (148-167)/(68-79) 162/74 (04/10 1808) SpO2:  [100 %] 100 % (04/10 1808)   General:   Pleasant male in NAD Head:  Normocephalic and atraumatic. Eyes:   No icterus.   Conjunctiva pale Ears:  Normal auditory acuity. Neck:  Supple Lungs:  Respirations even and unlabored.  Heart:  Mild tachy Abdomen:  Soft, (+) hepatomegaly,  nondistended, nontender.  Rectal:  Not performed.  Msk:  Symmetrical without gross deformities.  Extremities:  Without edema. Neurologic:  Alert and  oriented x4;  grossly normal neurologically. Skin:  Intact without significant lesions or rashes. Psych:  Alert and cooperative. Normal affect.  LAB RESULTS: Recent Labs    02/09/20 1543 02/10/20 0237 02/11/20 0637  WBC 14.6* 11.0* 21.5*  HGB 7.6* 6.4* 6.1*  HCT 25.4* 20.9* 20.2*  PLT 163 160 157   BMET Recent Labs    02/09/20 1543 02/10/20 0237 02/11/20 0637  NA 135 135 135  K 4.3 4.2 4.2  CL 104 105 107  CO2 21* 17* 11*  GLUCOSE 149* 142* 188*  BUN 38* 37* 32*  CREATININE 1.09 1.00 1.04  CALCIUM 8.9 8.2* 7.7*   LFT Recent Labs    02/11/20 0637  PROT 6.7  ALBUMIN 2.2*  AST  34  ALT 22  ALKPHOS 66  BILITOT 1.4*   PT/INR Recent Labs    02/09/20 2150  LABPROT 10.5*  INR 0.8    STUDIES: MR THORACIC SPINE W WO CONTRAST  Result Date: 02/10/2020 CLINICAL DATA:  Initial evaluation for lower back pain with elevated lactic acid, evaluate for spinal infection. History of metastatic neuroendocrine tumor. EXAM: MRI THORACIC AND LUMBAR SPINE WITHOUT AND WITH CONTRAST TECHNIQUE: Multiplanar and multiecho pulse sequences of the thoracic and lumbar spine were obtained without and with intravenous contrast. CONTRAST:  7.26mL GADAVIST GADOBUTROL 1 MMOL/ML IV SOLN COMPARISON:  Prior CT from earlier the same day as well as previous PET-CT from 10/15/2018. FINDINGS: MRI THORACIC SPINE FINDINGS Alignment: Physiologic with preservation of the normal thoracic kyphosis. No listhesis. Vertebrae: Multiple scattered osseous metastases seen throughout the thoracic spine. These are seen at numerous levels, including the T2, T4, T5, T6, T9, and T11 levels. For reference purposes, largest of these lesions is seen at T6, with the T6 vertebral body is largely replaced by tumor. Vertebral body height maintained without associated pathologic fracture. There is associated epidural extension with tumor seen extending into the ventral epidural space at the levels of T6 (series 29, image 19), and T11 (series 29, image 43). Associated mild to moderate spinal stenosis at these levels, most pronounced at T11. No other significant epidural or extraosseous tumor. Underlying bone marrow signal intensity diffusely decreased on T1 weighted sequence, likely related to history of malignancy and post treatment changes. No findings to suggest osteomyelitis discitis or septic arthritis. Cord: Signal intensity within the thoracic spinal cord is within normal limits. No epidural abscess or other collection. Paraspinal and other soft tissues: Paraspinous soft tissues demonstrate  no acute finding. Minimal atelectatic changes  noted within the partially visualized lungs. Partially visualized liver is markedly enlarged with innumerable metastatic lesions, better seen on prior CT. Disc levels: T4-5: Small central disc protrusion mildly indents the ventral thecal sac. No significant stenosis. T8-9: Mild disc bulge, eccentric to the left. No significant stenosis. No other significant disc pathology seen within the thoracic spine. No other significant stenosis. MRI LUMBAR SPINE FINDINGS Segmentation: Transitional lumbosacral anatomy with sacralized L5 vertebral body. Last well-formed disc space labeled L4-5 on this exam. Alignment: Physiologic with preservation of the normal lumbar lordosis. No listhesis. Vertebrae: Multiple scattered osseous metastases seen throughout the lumbar spine, specifically involving the L1 and L3 vertebral bodies. For reference purposes, the largest lesion is seen at L3 and measures 2.4 cm in size. Probable few additional lesions noted within the partially visualized pelvis. No extra osseous or epidural tumor within the lumbar spine. Underlying bone marrow signal intensity diffusely decreased on T1 weighted imaging. No evidence for osteomyelitis discitis or septic arthritis. Conus medullaris: Extends to the T12-L1 level and appears normal. Paraspinal and other soft tissues: Paraspinous soft tissues demonstrate no acute finding. Liver is markedly enlarged with innumerable metastases, better seen on prior CT. Disc levels: Diffuse congenital shortening of the pedicles noted. L1-2:  Negative interspace.  Mild facet hypertrophy.  No stenosis. L2-3: Chronic intervertebral disc space narrowing with diffuse disc bulge and disc desiccation. Mild to moderate facet hypertrophy. Changes superimposed on short pedicles resultant moderate spinal stenosis. Mild bilateral foraminal narrowing. L3-4: Mild diffuse disc bulge with disc desiccation, asymmetric to the left. Mild to moderate facet hypertrophy. Trace bilateral joint  effusions. Mild epidural lipomatosis. Changes superimposed on short pedicles resultant moderate spinal stenosis. Mild bilateral L3 foraminal narrowing. L4-5: Chronic intervertebral disc space narrowing with diffuse disc bulge and disc desiccation. Superimposed broad-based central disc protrusion with slight superior angulation and annular fissure. Moderate right worse than left facet hypertrophy. Epidural lipomatosis. Changes superimposed on short pedicles resultant mild-to-moderate spinal stenosis. Foramina remain patent. L5-S1: Transitional lumbosacral anatomy with rudimentary L5-S1 disc. No stenosis. IMPRESSION: MRI THORACIC SPINE IMPRESSION 1. No MRI evidence for acute infection within the thoracic spine. 2. Multiple osseous metastatic lesions involving the thoracic spine as above, most pronounced at T6 and T11 where there is associated extension of tumor into the adjacent ventral epidural space. Resultant mild to moderate spinal stenosis at these levels, most pronounced at T11. 3. Marked hepatomegaly with innumerable hepatic metastases, better seen on prior abdominal CT. MRI LUMBAR SPINE IMPRESSION 1. No MRI evidence for acute infection within the lumbar spine. 2. Osseous metastatic lesions involving the lumbar spine at the L1 and L3 levels. No extra osseous or epidural tumor identified. 3. 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 4. Transitional lumbosacral anatomy. Careful correlation to numbering system on this exam recommended prior to any potential future intervention. Electronically Signed   By: Jeannine Boga M.D.   On: 02/10/2020 00:23   MR Lumbar Spine W Wo Contrast  Result Date: 02/10/2020 CLINICAL DATA:  Initial evaluation for lower back pain with elevated lactic acid, evaluate for spinal infection. History of metastatic neuroendocrine tumor. EXAM: MRI THORACIC AND LUMBAR SPINE WITHOUT AND WITH CONTRAST TECHNIQUE: Multiplanar  and multiecho pulse sequences of the thoracic and lumbar spine were obtained without and with intravenous contrast. CONTRAST:  7.71mL GADAVIST GADOBUTROL 1 MMOL/ML IV SOLN COMPARISON:  Prior CT from earlier the same day as well as previous PET-CT from  10/15/2018. FINDINGS: MRI THORACIC SPINE FINDINGS Alignment: Physiologic with preservation of the normal thoracic kyphosis. No listhesis. Vertebrae: Multiple scattered osseous metastases seen throughout the thoracic spine. These are seen at numerous levels, including the T2, T4, T5, T6, T9, and T11 levels. For reference purposes, largest of these lesions is seen at T6, with the T6 vertebral body is largely replaced by tumor. Vertebral body height maintained without associated pathologic fracture. There is associated epidural extension with tumor seen extending into the ventral epidural space at the levels of T6 (series 29, image 19), and T11 (series 29, image 43). Associated mild to moderate spinal stenosis at these levels, most pronounced at T11. No other significant epidural or extraosseous tumor. Underlying bone marrow signal intensity diffusely decreased on T1 weighted sequence, likely related to history of malignancy and post treatment changes. No findings to suggest osteomyelitis discitis or septic arthritis. Cord: Signal intensity within the thoracic spinal cord is within normal limits. No epidural abscess or other collection. Paraspinal and other soft tissues: Paraspinous soft tissues demonstrate no acute finding. Minimal atelectatic changes noted within the partially visualized lungs. Partially visualized liver is markedly enlarged with innumerable metastatic lesions, better seen on prior CT. Disc levels: T4-5: Small central disc protrusion mildly indents the ventral thecal sac. No significant stenosis. T8-9: Mild disc bulge, eccentric to the left. No significant stenosis. No other significant disc pathology seen within the thoracic spine. No other significant  stenosis. MRI LUMBAR SPINE FINDINGS Segmentation: Transitional lumbosacral anatomy with sacralized L5 vertebral body. Last well-formed disc space labeled L4-5 on this exam. Alignment: Physiologic with preservation of the normal lumbar lordosis. No listhesis. Vertebrae: Multiple scattered osseous metastases seen throughout the lumbar spine, specifically involving the L1 and L3 vertebral bodies. For reference purposes, the largest lesion is seen at L3 and measures 2.4 cm in size. Probable few additional lesions noted within the partially visualized pelvis. No extra osseous or epidural tumor within the lumbar spine. Underlying bone marrow signal intensity diffusely decreased on T1 weighted imaging. No evidence for osteomyelitis discitis or septic arthritis. Conus medullaris: Extends to the T12-L1 level and appears normal. Paraspinal and other soft tissues: Paraspinous soft tissues demonstrate no acute finding. Liver is markedly enlarged with innumerable metastases, better seen on prior CT. Disc levels: Diffuse congenital shortening of the pedicles noted. L1-2:  Negative interspace.  Mild facet hypertrophy.  No stenosis. L2-3: Chronic intervertebral disc space narrowing with diffuse disc bulge and disc desiccation. Mild to moderate facet hypertrophy. Changes superimposed on short pedicles resultant moderate spinal stenosis. Mild bilateral foraminal narrowing. L3-4: Mild diffuse disc bulge with disc desiccation, asymmetric to the left. Mild to moderate facet hypertrophy. Trace bilateral joint effusions. Mild epidural lipomatosis. Changes superimposed on short pedicles resultant moderate spinal stenosis. Mild bilateral L3 foraminal narrowing. L4-5: Chronic intervertebral disc space narrowing with diffuse disc bulge and disc desiccation. Superimposed broad-based central disc protrusion with slight superior angulation and annular fissure. Moderate right worse than left facet hypertrophy. Epidural lipomatosis. Changes  superimposed on short pedicles resultant mild-to-moderate spinal stenosis. Foramina remain patent. L5-S1: Transitional lumbosacral anatomy with rudimentary L5-S1 disc. No stenosis. IMPRESSION: MRI THORACIC SPINE IMPRESSION 1. No MRI evidence for acute infection within the thoracic spine. 2. Multiple osseous metastatic lesions involving the thoracic spine as above, most pronounced at T6 and T11 where there is associated extension of tumor into the adjacent ventral epidural space. Resultant mild to moderate spinal stenosis at these levels, most pronounced at T11. 3. Marked hepatomegaly with innumerable hepatic metastases, better  seen on prior abdominal CT. MRI LUMBAR SPINE IMPRESSION 1. No MRI evidence for acute infection within the lumbar spine. 2. Osseous metastatic lesions involving the lumbar spine at the L1 and L3 levels. No extra osseous or epidural tumor identified. 3. 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 4. Transitional lumbosacral anatomy. Careful correlation to numbering system on this exam recommended prior to any potential future intervention. Electronically Signed   By: Jeannine Boga M.D.   On: 02/10/2020 00:23   CT ABDOMEN PELVIS W CONTRAST  Result Date: 02/09/2020 CLINICAL DATA:  Vomiting and back pain x4 days. EXAM: CT ABDOMEN AND PELVIS WITH CONTRAST TECHNIQUE: Multidetector CT imaging of the abdomen and pelvis was performed using the standard protocol following bolus administration of intravenous contrast. CONTRAST:  178mL OMNIPAQUE IOHEXOL 300 MG/ML  SOLN COMPARISON:  January 15, 2020 FINDINGS: Lower chest: No acute abnormality. Hepatobiliary: The liver is markedly enlarged. Numerous heterogeneous low-attenuation liver lesions of various sizes are seen. No gallstones, gallbladder wall thickening, or biliary dilatation. Pancreas: A 2.1 cm x 2.6 cm heterogeneous low-attenuation mass is seen at the junction of the pancreatic  body and tail (axial CT image 33, CT series number 2). An additional 2.6 cm x 2.9 cm heterogeneous low-attenuation mass is seen within the pancreatic tail (axial CT image 29, CT series number 2). Spleen: Small calcified granulomas are seen scattered throughout the spleen. Adrenals/Urinary Tract: Adrenal glands are unremarkable. Kidneys are normal, without renal calculi, focal lesion, or hydronephrosis. Bladder is unremarkable. Stomach/Bowel: Stomach is within normal limits. Appendix appears normal. No evidence of bowel wall thickening, distention, or inflammatory changes. A large amount of stool is seen throughout the colon. Vascular/Lymphatic: Moderate severity aortic calcification. No enlarged abdominal or pelvic lymph nodes. Reproductive: Prostate is unremarkable. Other: No abdominal wall hernia or abnormality. No abdominopelvic ascites. Musculoskeletal: Sclerotic foci are seen involving the T12, L2, L4 vertebral bodies with additional small sclerotic foci noted within the left iliac bone. The IMPRESSION: 1. Marked severity hepatomegaly with numerous heterogeneous low-attenuation liver lesions of various sizes, consistent with hepatic metastasis. 2. Findings consistent with osseous metastasis involving the T12, L2, L4 vertebral bodies and left iliac bone. 3. Low-attenuation soft tissue masses within the pancreas which may represent a primary malignancy. Aortic Atherosclerosis (ICD10-I70.0). Electronically Signed   By: Virgina Norfolk M.D.   On: 02/09/2020 20:16   DG Chest Portable 1 View  Result Date: 02/09/2020 CLINICAL DATA:  Fever EXAM: PORTABLE CHEST 1 VIEW COMPARISON:  01/15/2020 FINDINGS: Right Port-A-Cath remains in place, unchanged. Low lung volumes. Right base atelectasis. Left lung clear. Heart is normal size. No effusions or acute bony abnormality. IMPRESSION: Low lung volumes. Right base atelectasis. Findings similar to prior study. Electronically Signed   By: Rolm Baptise M.D.   On: 02/09/2020  21:46     Impression / Plan:   59 year old male with a history of metastatic neuroendocrine small cell carcinoma to bone and liver, initially admitted for concern of sepsis with fever and elevated white blood cell count.  On broad-spectrum antibiotics.  He has had some diarrhea and C. difficile antigen positive with toxin negative which just returned a few hours ago, PCR pending.  During this time he has had worsening anemia and developed melena during his hospitalization with rising BUN. He is clearly having an upper GI bleed, received another unit of blood and awaiting posttransfusion H&H.  Vitals relatively stable and he states he is feeling much better than what  he did on admission.  Discussed the situation with the patient.  I am recommending an upper endoscopy to evaluate his GI bleeding, and after discussion of risks and benefits and wanted proceed with that.  Will await his posttransfusion H&H tonight and give more blood if needed.  He looks stable at this time and I think can wait until tomorrow for endoscopy.  We will keep him n.p.o. after midnight in clear liquid diet until then.  In the chart he was supposed to have been on IV Protonix but I do not see that he ever received a dose of it.  I will give him a loading dose and then put him on a Protonix drip.  His platelets and INR are normal.  Otherwise sepsis of unclear etiology so far and with rising white blood cell count.  Awaiting C. difficile PCR, antigen positive but toxin negative just returned, C. difficile colitis could account for this, hopefully this result is back tonight or tomorrow.  Will follow and plan for EGD tomorrow if stable, call with questions.  Daggett Cellar, MD [facility]

## 2020-02-12 ENCOUNTER — Encounter (HOSPITAL_COMMUNITY): Payer: Self-pay | Admitting: Family Medicine

## 2020-02-12 ENCOUNTER — Encounter (HOSPITAL_COMMUNITY)
Admission: EM | Disposition: A | Discharge status: Hospice | Payer: Self-pay | Source: Home / Self Care | Attending: Internal Medicine

## 2020-02-12 ENCOUNTER — Inpatient Hospital Stay (HOSPITAL_COMMUNITY): Payer: Medicaid Other | Admitting: Anesthesiology

## 2020-02-12 HISTORY — PX: FOREIGN BODY REMOVAL: SHX962

## 2020-02-12 HISTORY — PX: ESOPHAGOGASTRODUODENOSCOPY (EGD) WITH PROPOFOL: SHX5813

## 2020-02-12 LAB — GASTROINTESTINAL PANEL BY PCR, STOOL (REPLACES STOOL CULTURE)

## 2020-02-12 LAB — BASIC METABOLIC PANEL
Anion gap: 10 (ref 5–15)
BUN: 37 mg/dL — ABNORMAL HIGH (ref 6–20)
CO2: 16 mmol/L — ABNORMAL LOW (ref 22–32)
Calcium: 7.3 mg/dL — ABNORMAL LOW (ref 8.9–10.3)
Chloride: 110 mmol/L (ref 98–111)
Creatinine, Ser: 1.01 mg/dL (ref 0.61–1.24)
GFR calc Af Amer: >60 mL/min (ref >60)
GFR calc non Af Amer: >60 mL/min (ref >60)
Glucose, Bld: 169 mg/dL — ABNORMAL HIGH (ref 70–99)
Potassium: 4.5 mmol/L (ref 3.5–5.1)
Sodium: 136 mmol/L (ref 135–145)

## 2020-02-12 LAB — CBC
HCT: 25 % — ABNORMAL LOW (ref 39.0–52.0)
Hemoglobin: 8 g/dL — ABNORMAL LOW (ref 13.0–17.0)
MCH: 29.4 pg (ref 26.0–34.0)
MCHC: 32 g/dL (ref 30.0–36.0)
MCV: 91.9 fL (ref 80.0–100.0)
Platelets: 167 10*3/uL (ref 150–400)
RBC: 2.72 MIL/uL — ABNORMAL LOW (ref 4.22–5.81)
RDW: 15.9 % — ABNORMAL HIGH (ref 11.5–15.5)
WBC: 20 10*3/uL — ABNORMAL HIGH (ref 4.0–10.5)
nRBC: 0 % (ref 0.0–0.2)

## 2020-02-12 LAB — LACTIC ACID, PLASMA
Lactic Acid, Venous: 3.2 mmol/L (ref 0.5–1.9)
Lactic Acid, Venous: 3.4 mmol/L (ref 0.5–1.9)

## 2020-02-12 LAB — MAGNESIUM: Magnesium: 2.3 mg/dL (ref 1.7–2.4)

## 2020-02-12 LAB — CLOSTRIDIUM DIFFICILE BY PCR, REFLEXED: Toxigenic C. Difficile by PCR: POSITIVE — AB

## 2020-02-12 SURGERY — ESOPHAGOGASTRODUODENOSCOPY (EGD) WITH PROPOFOL
Anesthesia: General

## 2020-02-12 MED ORDER — PROPOFOL 500 MG/50ML IV EMUL
INTRAVENOUS | Status: DC | PRN
  Administered 2020-02-12: 150 ug/kg/min via INTRAVENOUS

## 2020-02-12 MED ORDER — SUCCINYLCHOLINE CHLORIDE 20 MG/ML IJ SOLN
INTRAMUSCULAR | Status: DC | PRN
  Administered 2020-02-12: 160 mg via INTRAVENOUS

## 2020-02-12 MED ORDER — HYDRALAZINE HCL 10 MG PO TABS: 10.0000 mg | ORAL_TABLET | Freq: Four times a day (QID) | ORAL | Status: DC | PRN

## 2020-02-12 MED ORDER — FENTANYL CITRATE (PF) 100 MCG/2ML IJ SOLN
INTRAMUSCULAR | Status: AC
  Filled 2020-02-12: qty 2

## 2020-02-12 MED ORDER — PROPOFOL 500 MG/50ML IV EMUL
INTRAVENOUS | Status: AC
  Filled 2020-02-12: qty 50

## 2020-02-12 MED ORDER — FENTANYL CITRATE (PF) 100 MCG/2ML IJ SOLN
INTRAMUSCULAR | Status: DC | PRN
  Administered 2020-02-12 (×2): 50 ug via INTRAVENOUS

## 2020-02-12 MED ORDER — LACTATED RINGERS IV SOLN: INTRAVENOUS | Status: DC

## 2020-02-12 MED ORDER — PROPOFOL 10 MG/ML IV BOLUS
INTRAVENOUS | Status: DC | PRN
  Administered 2020-02-12: 160 mg via INTRAVENOUS
  Administered 2020-02-12: 30 mg via INTRAVENOUS
  Administered 2020-02-12: 40 mg via INTRAVENOUS

## 2020-02-12 MED ORDER — ONDANSETRON HCL 4 MG/2ML IJ SOLN
INTRAMUSCULAR | Status: DC | PRN
  Administered 2020-02-12: 4 mg via INTRAVENOUS

## 2020-02-12 MED ORDER — VANCOMYCIN 50 MG/ML ORAL SOLUTION
125.0000 mg | Freq: Four times a day (QID) | ORAL | Status: DC
  Administered 2020-02-12 – 2020-02-16 (×17): 125 mg via ORAL
  Filled 2020-02-12 (×20): qty 2.5

## 2020-02-12 MED ORDER — SODIUM CHLORIDE 0.9 % IV SOLN: INTRAVENOUS | Status: AC

## 2020-02-12 MED ORDER — METOPROLOL TARTRATE 25 MG PO TABS
25.0000 mg | ORAL_TABLET | Freq: Two times a day (BID) | ORAL | Status: DC
  Administered 2020-02-12 – 2020-02-13 (×3): 25 mg via ORAL
  Filled 2020-02-12 (×3): qty 1

## 2020-02-12 SURGICAL SUPPLY — 15 items

## 2020-02-12 NOTE — Interval H&P Note (Signed)
History and Physical Interval Note:  02/12/2020 7:06 AM  [name]  has presented today for surgery, with the diagnosis of upper gi bleed.  The various methods of treatment have been discussed with the patient and family. After consideration of risks, benefits and other options for treatment, the patient has consented to  Procedure(s): ESOPHAGOGASTRODUODENOSCOPY (EGD) WITH PROPOFOL (N/A) as a surgical intervention.  The patient's history has been reviewed, patient examined, no change in status, stable for surgery.  I have reviewed the patient's chart and labs.  Questions were answered to the patient's satisfaction.     [place]

## 2020-02-12 NOTE — Progress Notes (Signed)
PROGRESS NOTE  [name] RSW:[id number] DOB: 1961/09/17 DOA: 02/09/2020 PCP: Patient, No Pcp Per   LOS: 3 days   Brief narrative: As per HPI,  [name] is a 59 y.o. 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 Patient reports he was diagnosed with liver cancer in Nov 2019. He has been following with Dr. Irene Limbo since that time and completed chemotherapy. Mets to back was already a suspicion. Over the last couple days reports vague abdominal and back pain.  He also reported nausea and vomiting but has been having multiple episodes of loose stools as well.    In the ED:  Patient was tachycardic, febrile but overall stable on room air. Labs remarkable for WBC 14.6, Hgb 7.6 and Lactate 3.7. Lipase, CMP and UA WNL. CXR: Low lung volumes. Right base atelectasis. Findings similar to prior study.  CT scan of the abdomen pelvis showed marked severity hepatomegaly with numerous heterogeneous low-attenuation liver lesions of various sizes, consistent with hepatic metastasis.. Findings consistent with osseous metastasis involving the T12, L2, L4 vertebral bodies and left iliac bone. Low-attenuation soft tissue masses within the pancreas which may represent a primary malignancy. Patient was given Zofran, Dilaudid and IVF.   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  Urinalysis and chest x-ray without any obvious source of  infection.  Negative blood cultures, urine cultures so far.  He was on Vancomycin, cefepime and Flagyl and also received IV fluids for possible sepsis. C diff PCR is positive but he does have symptoms and nothing else is pertaining for evidence of infection.  This could be C. difficile colitis/diarrhea.  Will discontinue rest of the antibiotic.  Put the patient on p.o. vancomycin.  Patient was last seen by Dr. Irene Limbo on 12/19/2019.    Patient with history of recent COVID-19 infection.  Check a lactate in a.m.  Continue IV fluids for 1 more day.  Nausea, vomiting diarrhea, abdominal pain, back pain.  Likely secondary to metastatic cancer. On oxycodone po and iv dilaudid for severe pain.  Nausea vomiting diarrhea could be from C. difficile infection.  Will start on oral vancomycin today.  Significant anemia with melena..  Hemoglobin of 6.4 on presentation.  Total of 3 units of packed RBC so far.  Patient did have melanotic stool today.  Seen after upper GI endoscopy.  Endoscopy today was a difficult exam with no active bleeding but possible gastroesophageal junction ulcer versus variant of mucosa.  Patient was advised clear liquids and IV Protonix twice daily.  GI is planning to do relook endoscopy in the next 48 hours with possible endoscopic ultrasound.  Hypomagnesemia.  Continue to replenish as necessary.  Check CBC BMP magnesium in a.m.  Gout Hold prednisone for now.  No acute flare..  Essential HTN Supposed to be on Coreg but not taking it due to some kind of side effect.  Add as needed hydralazine and p.o. metoprolol for now.  Will closely monitor  VTE Prophylaxis:  SCD due to GI bleed.  Code Status: Full code  Family Communication: None today.  I spoke with the patient's spouse on the phone yesterday.  Disposition Plan:  . Patient is from home . Likely disposition to home likely in 2 to 3 days . Barriers to discharge: Initiate treatment for C. difficile diarrhea, GI considering relook  upper GI endoscopy in 48 hours.  Consultants:  GI  Procedures:  Transfusion of packed RBC  Upper GI endoscopy 02/12/2020  Antibiotics:  . Vancomycin, metronidazole and cefepime- stopped 4/11 . Po Vancomycin 4/11>  Anti-infectives (From admission, onward)   Start     Dose/Rate Route Frequency Ordered Stop   02/12/20 1400  vancomycin (VANCOCIN) 50 mg/mL oral solution 125 mg     125 mg Oral 4 times daily 02/12/20 1152 02/22/20 1359   02/10/20 2000  vancomycin (VANCOREADY) IVPB 1750 mg/350 mL  Status:  Discontinued     1,750 mg 175 mL/hr over 120 Minutes Intravenous Every 24 hours 02/10/20 0355 02/12/20 1250   02/10/20 0800  metroNIDAZOLE (FLAGYL) IVPB 500 mg     500 mg 100 mL/hr over 60 Minutes Intravenous Every 8 hours 02/10/20 0118     02/10/20 0600  ceFEPIme (MAXIPIME) 2 g in sodium chloride 0.9 % 100 mL IVPB  Status:  Discontinued     2 g 200 mL/hr over 30 Minutes Intravenous Every 8 hours 02/10/20 0355 02/12/20 1250   02/09/20 2200  vancomycin (VANCOREADY) IVPB 1500 mg/300 mL     1,500 mg 150 mL/hr over 120 Minutes Intravenous  Once 02/09/20 2128 02/10/20 0310   02/09/20 2130  ceFEPIme (MAXIPIME) 2 g in sodium chloride 0.9 % 100 mL IVPB     2 g 200 mL/hr over 30 Minutes Intravenous  Once 02/09/20 2120 02/10/20 0032   02/09/20 2130  metroNIDAZOLE (FLAGYL) IVPB 500 mg     500 mg 100 mL/hr over 60 Minutes Intravenous  Once 02/09/20 2120 02/10/20 0106   02/09/20 2130  vancomycin (VANCOCIN) IVPB 1000 mg/200 mL premix  Status:  Discontinued     1,000 mg 200 mL/hr over 60 Minutes Intravenous  Once 02/09/20 2120 02/09/20 2128     Subjective: Today, patient was seen and examined at bedside after endoscopy.  Had a bowel movement which was loose black tarry.  Denies any chest pain, shortness of breath, dizziness, lightheadedness.   Objective: Vitals:   02/12/20 1227 02/12/20 1237  BP: (!) 173/83 (!) 168/79  Pulse: (!) 117 (!) 111  Resp: (!) 28 (!) 22  Temp: 97.6 F (36.4  C) 98 F (36.7 C)  SpO2: 100% 100%    Intake/Output Summary (Last 24 hours) at 02/12/2020 1250 Last data filed at 02/12/2020 1200 Gross per 24 hour  Intake 3580.52 ml  Output 175 ml  Net 3405.52 ml   Filed Weights   02/12/20 0700  Weight: 72.1 kg   Body mass index is 24.9 kg/m.   Physical Exam: GENERAL: Patient is alert awake and communicative not in obvious distress. HENT: Mild pallor noted.  Pupils equally reactive to light. Oral mucosa is moist NECK: is supple, no gross swelling noted. CHEST: Clear to auscultation. No crackles or wheezes.  Diminished breath sounds bilaterally. CVS: S1 and S2 heard, no murmur. Regular rate and rhythm.  Nonspecific tenderness on palpation of the back. ABDOMEN: Soft, nonspecific tenderness over epigastric region on palpation with palpation of abdominal mass.  Bowel sounds are present. EXTREMITIES: Bilateral lower extremity edema trace. CNS: Cranial nerves are  intact. No focal motor deficits. SKIN: warm and dry without rashes.  Data Review: I have personally reviewed the following laboratory data and studies,  CBC: Recent Labs  Lab 02/09/20 1543 02/10/20 0237 02/11/20 0637 02/11/20 2023 02/12/20 0213  WBC 14.6* 11.0* 21.5*  --  20.0*  HGB 7.6* 6.4* 6.1* 7.7* 8.0*  HCT 25.4* 20.9* 20.2* 24.1* 25.0*  MCV 91.7 92.5 93.1  --  91.9  PLT 163 160 157  --  914   Basic Metabolic Panel: Recent Labs  Lab 02/09/20 1543 02/10/20 0237 02/11/20 0637 02/12/20 0213  NA 135 135 135 136  K 4.3 4.2 4.2 4.5  CL 104 105 107 110  CO2 21* 17* 11* 16*  GLUCOSE 149* 142* 188* 169*  BUN 38* 37* 32* 37*  CREATININE 1.09 1.00 1.04 1.01  CALCIUM 8.9 8.2* 7.7* 7.3*  MG  --   --  1.6* 2.3  PHOS  --   --  2.4*  --    Liver Function Tests: Recent Labs  Lab 02/09/20 1543 02/10/20 0237 02/11/20 0637  AST 31 31 34  ALT 23 23 22   ALKPHOS 99 89 66  BILITOT 1.3* 1.3* 1.4*  PROT 8.5* 7.5 6.7  ALBUMIN 3.0* 2.6* 2.2*   Recent Labs  Lab 02/09/20 1543   LIPASE 21   No results for input(s): AMMONIA in the last 168 hours. Cardiac Enzymes: No results for input(s): CKTOTAL, CKMB, CKMBINDEX, TROPONINI in the last 168 hours. BNP (last 3 results) Recent Labs    05/10/19 0545 01/16/20 0405  BNP 40.1 81.9    ProBNP (last 3 results) No results for input(s): PROBNP in the last 8760 hours.  CBG: No results for input(s): GLUCAP in the last 168 hours. Recent Results (from the past 240 hour(s))  Urine culture     Status: None   Collection Time: 02/09/20  7:48 PM   Specimen: Urine, Clean Catch  Result Value Ref Range Status   Specimen Description   Final    URINE, CLEAN CATCH Performed at [facility], [place] [address], [place], [place] [address]    Special Requests   Final    NONE Performed at [facility], [place] [address] [place], [place] [address]    Culture   Final    NO GROWTH Performed at [facility], [place] [address] [place], [place] [address]    Report Status 02/11/2020 FINAL  Final  Culture, blood (routine x 2)     Status: None (Preliminary result)   Collection Time: 02/09/20  9:04 PM   Specimen: BLOOD  Result Value Ref Range Status   Specimen Description   Final    BLOOD BLOOD LEFT FOREARM Performed at [facility] [address], [place], [place] [address]    Special Requests   Final    BOTTLES DRAWN AEROBIC AND ANAEROBIC Blood Culture results may not be optimal due to an excessive volume of blood received in culture bottles Performed at [facility] [address] [place], [place] [address]    Culture   Final    NO GROWTH 2 DAYS Performed at [place] [address], [place], [place] [address]    Report Status PENDING  Incomplete  Culture, blood (routine x 2)     Status: None (Preliminary result)   Collection Time: 02/09/20  9:45 PM   Specimen: BLOOD LEFT FOREARM  Result Value Ref Range Status   Specimen Description  Final    BLOOD LEFT FOREARM Performed at [facility] [address], [place], [place] [address]    Special Requests   Final    BOTTLES DRAWN AEROBIC AND ANAEROBIC Blood Culture results may not be optimal due to an excessive volume of blood received in culture bottles Performed at [facility] [address], [place], [place] [address]    Culture   Final    NO GROWTH 2 DAYS Performed at [facility] [address] [place], [place] [address]    Report Status PENDING  Incomplete  SARS CORONAVIRUS 2 (TAT 6-24 HRS) Nasopharyngeal Nasopharyngeal Swab     Status: None   Collection Time: 02/10/20 12:10 AM   Specimen: Nasopharyngeal Swab  Result Value Ref Range Status   SARS Coronavirus 2 NEGATIVE NEGATIVE Final    Comment: (NOTE) SARS-CoV-2 target nucleic acids are NOT DETECTED. The SARS-CoV-2 RNA is generally detectable in upper and lower respiratory specimens during the acute phase of infection. Negative results do not preclude SARS-CoV-2 infection, do not rule out co-infections with other pathogens, and should not be used as the sole basis for treatment or other patient management decisions. Negative results must be combined with clinical observations, patient history, and epidemiological information. The expected result is Negative. Fact Sheet for Patients: SugarRoll.be Fact Sheet for Healthcare Providers: https://www.woods-mathews.com/ This test is not yet approved or cleared by the [place] FDA and  has been authorized for detection and/or diagnosis of SARS-CoV-2 by FDA under an Emergency Use Authorization (EUA). This EUA will remain  in effect (meaning this test can be used) for the duration of the COVID-19 declaration under Section 56 4(b)(1) of the Act, 21 U.S.C. section 360bbb-3(b)(1), unless the authorization is terminated or revoked sooner. Performed at [facility],  [place] [address], [place], [place] [address]   C Difficile Quick Screen w PCR reflex     Status: Abnormal   Collection Time: 02/11/20  3:00 PM   Specimen: STOOL  Result Value Ref Range Status   C Diff antigen POSITIVE (A) NEGATIVE Final   C Diff toxin NEGATIVE NEGATIVE Final   C Diff interpretation Results are indeterminate. See PCR results.  Final    Comment: Performed at [facility], [place] [address] [place], [place] [address]  C. Diff by PCR, Reflexed     Status: Abnormal   Collection Time: 02/11/20  3:00 PM  Result Value Ref Range Status   Toxigenic C. Difficile by PCR POSITIVE (A) NEGATIVE Final    Comment: Positive for toxigenic C. difficile with little to no toxin production. Only treat if clinical presentation suggests symptomatic illness. Performed at [facility], [place] [address], [place], [place] [address]      Studies: No results found.   Flora Lipps, MD  [facility] 02/12/2020

## 2020-02-12 NOTE — Anesthesia Postprocedure Evaluation (Signed)
Anesthesia Post Note  Patient: [name]  Procedure(s) Performed: ESOPHAGOGASTRODUODENOSCOPY (EGD) WITH PROPOFOL (N/A ) FOREIGN BODY REMOVAL     Patient location during evaluation: PACU Anesthesia Type: General Level of consciousness: sedated Pain management: pain level controlled Vital Signs Assessment: post-procedure vital signs reviewed and stable Respiratory status: spontaneous breathing and respiratory function stable Cardiovascular status: stable Postop Assessment: no apparent nausea or vomiting Anesthetic complications: no    Last Vitals:  Vitals:   02/12/20 1135 02/12/20 1155  BP: (!) 197/73 (!) 195/78  Pulse: (!) 112 (!) 117  Resp: (!) 39 (!) 33  Temp:    SpO2: 100% 100%    Last Pain:  Vitals:   02/12/20 1155  TempSrc:   PainSc: 0-No pain                 Bethany Hirt DANIEL

## 2020-02-12 NOTE — Anesthesia Procedure Notes (Signed)
Procedure Name: Intubation Date/Time: 02/12/2020 8:45 AM Performed by: Karole Oo D, CRNA Pre-anesthesia Checklist: Patient identified, Emergency Drugs available, Suction available and Patient being monitored Patient Re-evaluated:Patient Re-evaluated prior to induction Oxygen Delivery Method: Circle system utilized Preoxygenation: Pre-oxygenation with 100% oxygen Induction Type: IV induction, Rapid sequence and Cricoid Pressure applied Laryngoscope Size: Mac and 4 Grade View: Grade I Tube type: Oral Tube size: 7.5 mm Number of attempts: 1 Airway Equipment and Method: Stylet Placement Confirmation: ETT inserted through vocal cords under direct vision,  positive ETCO2 and breath sounds checked- equal and bilateral Secured at: 22 cm Tube secured with: Tape Dental Injury: Teeth and Oropharynx as per pre-operative assessment

## 2020-02-12 NOTE — Transfer of Care (Signed)
Immediate Anesthesia Transfer of Care Note  Patient: [name]  Procedure(s) Performed: ESOPHAGOGASTRODUODENOSCOPY (EGD) WITH PROPOFOL (N/A ) FOREIGN BODY REMOVAL  Patient Location: PACU  Anesthesia Type:General  Level of Consciousness: awake, alert  and oriented  Airway & Oxygen Therapy: Patient Spontanous Breathing and Patient connected to nasal cannula oxygen  Post-op Assessment: Report given to RN and Post -op Vital signs reviewed and stable  Post vital signs: Reviewed and stable  Last Vitals:  Vitals Value Taken Time  BP    Temp    Pulse    Resp    SpO2      Last Pain:  Vitals:   02/12/20 0700  TempSrc: Oral  PainSc: 6       Patients Stated Pain Goal: 0 (50/72/25 7505)  Complications: No apparent anesthesia complications

## 2020-02-12 NOTE — Op Note (Signed)
[facility] Patient Name: [name] Procedure Date: 02/12/2020 MRN: [id number] Attending MD: Carlota Raspberry. Havery Moros , MD Date of Birth: [birth year] CSN: [id number] Age: 59 Admit Type: Inpatient Procedure:                Upper GI endoscopy Indications:              Suspected upper gastrointestinal bleeding - history                            of metastatic small cell neuroendocrine tumor,                            melena Providers:                Remo Lipps P. Havery Moros, MD, Josie Dixon, RN,                            Cherylynn Ridges, Technician, [facility] CRNA Referring MD:              Medicines:                Monitored Anesthesia Care Complications:            No immediate complications. Estimated blood loss:                            Minimal. Estimated Blood Loss:     Estimated blood loss was minimal. Procedure:                Pre-Anesthesia Assessment:                           - Prior to the procedure, a History and Physical                            was performed, and patient medications and                            allergies were reviewed. The patient's tolerance of                            previous anesthesia was also reviewed. The risks                            and benefits of the procedure and the sedation                            options and risks were discussed with the patient.                            All questions were answered, and informed consent                            was obtained. Prior Anticoagulants: The patient has                            taken  no previous anticoagulant or antiplatelet                            agents. ASA Grade Assessment: III - A patient with                            severe systemic disease. After reviewing the risks                            and benefits, the patient was deemed in                            satisfactory condition to undergo the procedure.                           After  obtaining informed consent, the endoscope was                            passed under direct vision. Throughout the                            procedure, the patient's blood pressure, pulse, and                            oxygen saturations were monitored continuously. The                            GIF-H190 ([id number]) Olympus gastroscope was                            introduced through the mouth, and advanced to the                            second part of duodenum. The upper GI endoscopy was                            technically difficult and complex. The patient                            tolerated the procedure well. Scope In: Scope Out: Findings:      Esophagogastric landmarks were identified: the Z-line was found at 38       cm, the gastroesophageal junction was found at 38 cm and the upper       extent of the gastric folds was found at 38 cm from the incisors.      There was some suspected mild extrinsic compression of the GEJ with a       bit of an angulation entering the stomach. A suspected ulcer was noted       at the GEJ - clean based, vs blanching effect from compression was       noted. The exam of the esophagus was otherwise normal. Of note during       the case the endoscope caused suction lesion just inferior to the GEJ in       the cardia (in case  noted on follow up exams)      A significant burden of old clotted blood was found in the gastric       fundus and in the gastric body, completely prohibiting views upon       entering the stomach. Given burden of clot, the patient was immediately       intubated per anesthesia, and therpeutic endoscope use. Over 2 hours       were spent tediously removing clot with a variety of techniques until       complete clearance was achieved. There was no fresh / red blood anywhere       in the stomach. no active bleeding.      Suspected either extrinsic compression of the fundus vs. subepithelial       lesion was noted - did not  appear to be a varix, unclear if compression       from metastatic disease vs. normal variant. There was no stigmata for       bleeding but the tissue overlying this and in the fundus was extremely       friable and had contact oozing with lavage from the water jet.      The exam of the stomach was lavaged thoroughly for several minutes once       clot cleared. No active bleeding noted. I did not appreciate any       stigmata for recent bleeding. The stomach was otherwise normal.      The duodenal bulb and second portion of the duodenum were normal other       than old blood. Impression:               - Esophagogastric landmarks identified.                           - Suspect ulceration at GEJ vs. variant of blanched                            mucosa as outlined above.                           - Normal esophagus otherwise                           - Clotted blood in the gastric fundus and in the                            gastric body. Significant time spent to clear the                            stomach                           - Extrinsic compression vs. subepithelial lesion in                            the fundus with friable tissue in the fundus in                            general but no high risk stigmata or bleeding  visualized                           - Normal stomach otherwise                           - Normal duodenal bulb and second portion of the                            duodenum.                           Difficult exam - no active bleeding, unclear true                            source of bleeding - possible GEJ ulcer vs. variant                            of mucosa. Changes in the fundus noted but no                            active bleeding or stigmata of bleeding Moderate Sedation:      No moderate sedation, case performed with MAC Recommendation:           - Return patient to hospital ward for ongoing care.                            - Clear liquid diet okay                           - Continue present medications.                           - IV protonix 38m BID                           - Trend Hgb, monitor for bleeding                           - Consideration for relook endoscopy in the next 48                            hours, possibly with EUS, will discuss with Dr.                            MRush Landmark Procedure Code(s):        --- Professional ---                           [id number] Esophagogastroduodenoscopy, flexible,                            transoral; diagnostic, including collection of                            specimen(s) by brushing or washing, when performed                            (  separate procedure) Diagnosis Code(s):        --- Professional ---                           K92.2, Gastrointestinal hemorrhage, unspecified                           K31.89, Other diseases of stomach and duodenum CPT copyright 2019 American Medical Association. All rights reserved. The codes documented in this report are preliminary and upon coder review may  be revised to meet current compliance requirements. Remo Lipps P. Lainee Lehrman, MD 02/12/2020 11:15:58 AM This report has been signed electronically. Number of Addenda: 0

## 2020-02-13 ENCOUNTER — Inpatient Hospital Stay (HOSPITAL_COMMUNITY): Payer: Medicaid Other

## 2020-02-13 ENCOUNTER — Encounter: Payer: Self-pay | Admitting: *Deleted

## 2020-02-13 ENCOUNTER — Encounter (HOSPITAL_COMMUNITY): Payer: Self-pay | Admitting: Anesthesiology

## 2020-02-13 ENCOUNTER — Inpatient Hospital Stay: Payer: Medicaid Other | Attending: Hematology

## 2020-02-13 ENCOUNTER — Encounter: Payer: Self-pay | Admitting: Hematology

## 2020-02-13 ENCOUNTER — Ambulatory Visit (HOSPITAL_COMMUNITY): Admission: RE | Admit: 2020-02-13 | Payer: Medicaid Other | Source: Ambulatory Visit

## 2020-02-13 DIAGNOSIS — K921 Melena: Secondary | ICD-10-CM

## 2020-02-13 DIAGNOSIS — C787 Secondary malignant neoplasm of liver and intrahepatic bile duct: Secondary | ICD-10-CM

## 2020-02-13 DIAGNOSIS — D62 Acute posthemorrhagic anemia: Secondary | ICD-10-CM

## 2020-02-13 DIAGNOSIS — A0472 Enterocolitis due to Clostridium difficile, not specified as recurrent: Secondary | ICD-10-CM

## 2020-02-13 DIAGNOSIS — C801 Malignant (primary) neoplasm, unspecified: Secondary | ICD-10-CM

## 2020-02-13 LAB — CBC
HCT: 24 % — ABNORMAL LOW (ref 39.0–52.0)
HCT: 19.8 % — ABNORMAL LOW (ref 39.0–52.0)
Hemoglobin: 7.6 g/dL — ABNORMAL LOW (ref 13.0–17.0)
Hemoglobin: 5.9 g/dL — CL (ref 13.0–17.0)
MCH: 29.3 pg (ref 26.0–34.0)
MCH: 28.8 pg (ref 26.0–34.0)
MCHC: 31.7 g/dL (ref 30.0–36.0)
MCHC: 29.8 g/dL — ABNORMAL LOW (ref 30.0–36.0)
MCV: 92.7 fL (ref 80.0–100.0)
MCV: 96.6 fL (ref 80.0–100.0)
Platelets: 124 10*3/uL — ABNORMAL LOW (ref 150–400)
Platelets: 212 10*3/uL (ref 150–400)
RBC: 2.59 MIL/uL — ABNORMAL LOW (ref 4.22–5.81)
RBC: 2.05 MIL/uL — ABNORMAL LOW (ref 4.22–5.81)
RDW: 17.3 % — ABNORMAL HIGH (ref 11.5–15.5)
RDW: 18.1 % — ABNORMAL HIGH (ref 11.5–15.5)
WBC: 24.7 10*3/uL — ABNORMAL HIGH (ref 4.0–10.5)
WBC: 33.8 10*3/uL — ABNORMAL HIGH (ref 4.0–10.5)
nRBC: 0.1 % (ref 0.0–0.2)
nRBC: 0.2 % (ref 0.0–0.2)

## 2020-02-13 LAB — COMPREHENSIVE METABOLIC PANEL
ALT: 83 U/L — ABNORMAL HIGH (ref 0–44)
AST: 156 U/L — ABNORMAL HIGH (ref 15–41)
Albumin: 2.6 g/dL — ABNORMAL LOW (ref 3.5–5.0)
Alkaline Phosphatase: 63 U/L (ref 38–126)
Anion gap: 12 (ref 5–15)
BUN: 32 mg/dL — ABNORMAL HIGH (ref 6–20)
CO2: 16 mmol/L — ABNORMAL LOW (ref 22–32)
Calcium: 7.1 mg/dL — ABNORMAL LOW (ref 8.9–10.3)
Chloride: 106 mmol/L (ref 98–111)
Creatinine, Ser: 1.07 mg/dL (ref 0.61–1.24)
GFR calc Af Amer: >60 mL/min (ref >60)
GFR calc non Af Amer: >60 mL/min (ref >60)
Glucose, Bld: 194 mg/dL — ABNORMAL HIGH (ref 70–99)
Potassium: 4.3 mmol/L (ref 3.5–5.1)
Sodium: 134 mmol/L — ABNORMAL LOW (ref 135–145)
Total Bilirubin: 2.2 mg/dL — ABNORMAL HIGH (ref 0.3–1.2)
Total Protein: 7.1 g/dL (ref 6.5–8.1)

## 2020-02-13 LAB — BLOOD GAS, ARTERIAL
Drawn by: 23532
FIO2: 21
O2 Saturation: 98.6 %
Patient temperature: 98.6
pCO2 arterial: 47.4 mmHg (ref 32.0–48.0)
pH, Arterial: 7.237 — ABNORMAL LOW (ref 7.350–7.450)
pO2, Arterial: 129 mmHg — ABNORMAL HIGH (ref 83.0–108.0)

## 2020-02-13 LAB — LACTIC ACID, PLASMA
Lactic Acid, Venous: 3.7 mmol/L (ref 0.5–1.9)
Lactic Acid, Venous: >11 mmol/L (ref 0.5–1.9)
Lactic Acid, Venous: 10.5 mmol/L (ref 0.5–1.9)

## 2020-02-13 LAB — GLUCOSE, CAPILLARY: Glucose-Capillary: 151 mg/dL — ABNORMAL HIGH (ref 70–99)

## 2020-02-13 LAB — PREPARE RBC (CROSSMATCH)

## 2020-02-13 LAB — MAGNESIUM: Magnesium: 2.4 mg/dL (ref 1.7–2.4)

## 2020-02-13 LAB — PHOSPHORUS: Phosphorus: 1.4 mg/dL — ABNORMAL LOW (ref 2.5–4.6)

## 2020-02-13 MED ORDER — SODIUM CHLORIDE 0.9 % IV BOLUS
500.0000 mL | Freq: Once | INTRAVENOUS | Status: DC
  Administered 2020-02-13: 500 mL via INTRAVENOUS

## 2020-02-13 MED ORDER — SODIUM CHLORIDE 0.9% FLUSH
10.0000 mL | Freq: Two times a day (BID) | INTRAVENOUS | Status: DC
  Administered 2020-02-13 – 2020-02-15 (×4): 10 mL
  Administered 2020-02-16: 3 mL

## 2020-02-13 MED ORDER — LORAZEPAM 2 MG/ML IJ SOLN
INTRAMUSCULAR | Status: AC
  Administered 2020-02-13: 1 mg via INTRAVENOUS
  Filled 2020-02-13: qty 1

## 2020-02-13 MED ORDER — HYDROMORPHONE HCL 1 MG/ML IJ SOLN
0.5000 mg | INTRAMUSCULAR | Status: DC | PRN
  Administered 2020-02-15: 0.5 mg via INTRAVENOUS
  Filled 2020-02-13 (×2): qty 1

## 2020-02-13 MED ORDER — PIPERACILLIN-TAZOBACTAM 3.375 G IVPB
3.3750 g | Freq: Three times a day (TID) | INTRAVENOUS | Status: DC
  Administered 2020-02-14 – 2020-02-16 (×8): 3.375 g via INTRAVENOUS
  Filled 2020-02-13 (×8): qty 50

## 2020-02-13 MED ORDER — IOHEXOL 350 MG/ML SOLN
100.0000 mL | Freq: Once | INTRAVENOUS | Status: AC | PRN
  Administered 2020-02-13: 100 mL via INTRAVENOUS

## 2020-02-13 MED ORDER — SODIUM CHLORIDE (PF) 0.9 % IJ SOLN
INTRAMUSCULAR | Status: AC
  Filled 2020-02-13: qty 50

## 2020-02-13 MED ORDER — HYDROMORPHONE HCL 1 MG/ML IJ SOLN: 0.5000 mg | INTRAMUSCULAR | Status: DC | PRN

## 2020-02-13 MED ORDER — OXYCODONE HCL 5 MG PO TABS
5.0000 mg | ORAL_TABLET | ORAL | Status: DC | PRN
  Administered 2020-02-15 – 2020-02-16 (×3): 5 mg via ORAL
  Filled 2020-02-13 (×3): qty 1

## 2020-02-13 MED ORDER — PIPERACILLIN-TAZOBACTAM 3.375 G IVPB 30 MIN
3.3750 g | INTRAVENOUS | Status: AC
  Administered 2020-02-13: 3.375 g via INTRAVENOUS
  Filled 2020-02-13: qty 50

## 2020-02-13 MED ORDER — LORAZEPAM 2 MG/ML IJ SOLN: 1.0000 mg | Freq: Once | INTRAMUSCULAR | Status: AC

## 2020-02-13 MED ORDER — SODIUM CHLORIDE 0.9 % IV BOLUS
1000.0000 mL | Freq: Once | INTRAVENOUS | Status: AC
  Administered 2020-02-13: 1000 mL via INTRAVENOUS

## 2020-02-13 MED ORDER — VANCOMYCIN HCL 750 MG/150ML IV SOLN
750.0000 mg | Freq: Two times a day (BID) | INTRAVENOUS | Status: DC
  Administered 2020-02-14: 750 mg via INTRAVENOUS
  Filled 2020-02-13 (×2): qty 150

## 2020-02-13 MED ORDER — VANCOMYCIN HCL 1500 MG/300ML IV SOLN
1500.0000 mg | INTRAVENOUS | Status: AC
  Administered 2020-02-13: 1500 mg via INTRAVENOUS
  Filled 2020-02-13: qty 300

## 2020-02-13 MED ORDER — SODIUM CHLORIDE 0.9% FLUSH: 10.0000 mL | INTRAVENOUS | Status: DC | PRN

## 2020-02-13 MED ORDER — SODIUM CHLORIDE 0.9% IV SOLUTION
Freq: Once | INTRAVENOUS | Status: AC
  Administered 2020-02-13: 10 mL via INTRAVENOUS

## 2020-02-13 MED ORDER — SODIUM PHOSPHATES 45 MMOLE/15ML IV SOLN
30.0000 mmol | Freq: Once | INTRAVENOUS | Status: AC
  Administered 2020-02-13: 30 mmol via INTRAVENOUS
  Filled 2020-02-13: qty 10

## 2020-02-13 NOTE — Progress Notes (Signed)
REport called to [facility] in ICU. Patient transferred via bed to 1222.

## 2020-02-13 NOTE — Progress Notes (Signed)
Ativan 1 mg wasted, witnessed by [facility].

## 2020-02-13 NOTE — Significant Event (Addendum)
I attended rapid response for patient having trouble breathing.  Nursing staff reported that he was tachypnea and tachycardic.  Patient denied any chest pain, palpitation, cough.  On narcotics for metastatic neuroendocrine tumor.  Chest was clear on auscultation.  Abdomen distended with liver mets.  Patient does have an elevated lactate at this morning.  No definite source of infection identified so far.  Chest x-ray stat did not show any evidence of infiltrate but bibasilar atelectasis, will reorder lactic.  Will give 1 L IV fluid bolus today.  Will give 1 dose of IV Ativan and continue on oral Ativan for anxiety.  We will transfer the patient to telemetry.  EKG showed sinus rhythm.  Updated her about the clinical condition of the patient.  Patient's wife stated that this is not seen or spoken with oncologist in the last 1 year due to Covid.  Dr. Irene Limbo is the patient's primary oncologist.  Will try to discuss with Dr. Irene Limbo regarding the prognosis of the patient and further care plan.  Addendum:  02/13/2020 5:48 PM  CBC stat was reported as having hemoglobin of 5.9.  Patient had hemoglobin of 7.6 this morning.  Will order for 2 units of packed RBC start.  Patient will receive 1 L of IV fluid bolus.  Will check POC glucose.  We will transfer the patient to stepdown unit at this time.  Communicated with the rapid response nurse and the bedside nurse about it.  Addendum:  02/13/2020 6:58 PM  I again attended patient in the  ICU. Spoke with Dr Silverio Decamp GI about the clinical condition and deterioration of hemoglobin. She suggested volume resuc, CT abd stat and iv abx and cultures. I also spoke with Jerene Pitch with PCCM and informed about the patient and the need for potential critical care during the night. Spoke with the nursing staff and the RR nurse about the plan as well.  Will get pancultures, add iv vanc and zosyn. I again spoke with the patient's wife about the critical nature of the problem.

## 2020-02-13 NOTE — Progress Notes (Signed)
PROGRESS NOTE  [name] QBH:[id number] DOB: 09-18-1961 DOA: 02/09/2020 PCP: Patient, No Pcp Per   LOS: 4 days   Brief narrative: As per HPI,  [name] is a 59 y.o. 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 Patient reports he was diagnosed with liver cancer in Nov 2019. He has been following with Dr. Irene Limbo since that time and completed chemotherapy. Mets to back was already a suspicion. Over the last couple days reports vague abdominal and back pain.  He also reported nausea and vomiting but has been having multiple episodes of loose stools as well.    In the ED:  Patient was tachycardic, febrile but overall stable on room air. Labs remarkable for WBC 14.6, Hgb 7.6 and Lactate 3.7. Lipase, CMP and UA WNL. CXR: Low lung volumes. Right base atelectasis. Findings similar to prior study.  CT scan of the abdomen pelvis showed marked severity hepatomegaly with numerous heterogeneous low-attenuation liver lesions of various sizes, consistent with hepatic metastasis.. Findings consistent with osseous metastasis involving the T12, L2, L4 vertebral bodies and left iliac bone. Low-attenuation soft tissue masses within the pancreas which may represent a primary malignancy. Patient was given Zofran, Dilaudid and IVF.   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  Urinalysis and chest x-ray without any obvious source of  infection.  Negative blood cultures, urine cultures so far.  He was on Vancomycin, cefepime and Flagyl and also received IV fluids for possible sepsis. C diff PCR is positive but he does have symptoms and nothing else is pertaining for other evidence of infection.  This could be C. difficile colitis/diarrhea.  Off IV antibiotic.   on p.o. vancomycin.  Patient was last seen by Dr. Irene Limbo on 12/19/2019.    Patient with history of recent COVID-19 infection.  Creatinine still elevated despite IV fluids.  Patient feels better today.  We will continue to monitor clinically.  Elevated AST, ALT.  Still with significant leukocytosis but no fever.  Nausea, vomiting diarrhea.  Possible C. difficile infection.  Improving and feels better.  Stools are becoming more formed as per the patient.  GI pathogen panel was negative.   abdominal pain, back pain.  Likely secondary to metastatic cancer. On oxycodone po and iv dilaudid for severe pain.   Significant anemia with melena..  Hemoglobin of 6.4 on presentation.  Received of 3 units of packed RBC so far.  Status post endoscopy.  Endoscopy was a difficult exam with no active bleeding but possible gastroesophageal junction ulcer versus variant of mucosa.  Patient was advised clear liquids and IV Protonix twice daily.  GI is planning to do relook endoscopy in the  with possible endoscopic ultrasound.  Globin of 7.6 today.  Hypomagnesemia.  Continue to replenish as necessary.  Magnesium 2.4 today.  Hypophosphatemia.  Will replenish IV due to diarrhea.  Check levels in a.m.  Gout Hold prednisone for now.  No acute flare..  Essential HTN Supposed  to be on Coreg but not taking it due to some kind of side effect.  Add as needed hydralazine and p.o. metoprolol for now.  Will closely monitor   VTE Prophylaxis:  SCD due to GI bleed.  Code Status: Full code  Family Communication: None today.    Disposition Plan:  . Patient is from home . Likely disposition to home  likely in 2 to 3 days . Barriers to discharge: Ongoing diarrhea on treatment for C. difficile diarrhea, GI considering relook upper GI endoscopy  Consultants:  GI  Procedures:  Transfusion of packed RBC  Upper GI endoscopy 02/12/2020  Antibiotics:  . Vancomycin, metronidazole and cefepime- stopped 4/11 . Po Vancomycin 4/11>  Anti-infectives (From admission, onward)   Start     Dose/Rate Route Frequency Ordered Stop   02/12/20 1400  vancomycin (VANCOCIN) 50 mg/mL oral solution 125 mg     125 mg Oral 4 times daily 02/12/20 1152 02/22/20 1359   02/10/20 2000  vancomycin (VANCOREADY) IVPB 1750 mg/350 mL  Status:  Discontinued     1,750 mg 175 mL/hr over 120 Minutes Intravenous Every 24 hours 02/10/20 0355 02/12/20 1250   02/10/20 0800  metroNIDAZOLE (FLAGYL) IVPB 500 mg  Status:  Discontinued     500 mg 100 mL/hr over 60 Minutes Intravenous Every 8 hours 02/10/20 0118 02/12/20 1319   02/10/20 0600  ceFEPIme (MAXIPIME) 2 g in sodium chloride 0.9 % 100 mL IVPB  Status:  Discontinued     2 g 200 mL/hr over 30 Minutes Intravenous Every 8 hours 02/10/20 0355 02/12/20 1250   02/09/20 2200  vancomycin (VANCOREADY) IVPB 1500 mg/300 mL     1,500 mg 150 mL/hr over 120 Minutes Intravenous  Once 02/09/20 2128 02/10/20 0310   02/09/20 2130  ceFEPIme (MAXIPIME) 2 g in sodium chloride 0.9 % 100 mL IVPB     2 g 200 mL/hr over 30 Minutes Intravenous  Once 02/09/20 2120 02/10/20 0032   02/09/20 2130  metroNIDAZOLE (FLAGYL) IVPB 500 mg     500 mg 100 mL/hr over 60 Minutes Intravenous  Once 02/09/20 2120 02/10/20 0106   02/09/20 2130  vancomycin (VANCOCIN) IVPB 1000 mg/200 mL premix  Status:  Discontinued     1,000 mg 200 mL/hr over 60 Minutes Intravenous  Once 02/09/20 2120 02/09/20 2128     Subjective: Today, patient was seen and examined at bedside.  Patient states that he did have some bowel movements today but had been more formed.  Denies any abdominal pain.,  Fever or  chills.  Objective: Vitals:   02/13/20 0510 02/13/20 1034  BP: (!) 152/67 (!) 158/71  Pulse: 92 93  Resp: 14   Temp: 98.3 F (36.8 C)   SpO2: 100%     Intake/Output Summary (Last 24 hours) at 02/13/2020 1154 Last data filed at 02/13/2020 1041 Gross per 24 hour  Intake 2703.42 ml  Output 175 ml  Net 2528.42 ml   Filed Weights   02/12/20 0700  Weight: 72.1 kg   Body mass index is 24.9 kg/m.   Physical Exam: General:  Average built, not in obvious distress, alert awake and communicative. HENT: Normocephalic, pupils equally reacting to light and accommodation.  Mild pallor noted.  Chest:  Clear breath sounds.  Diminished breath sounds bilaterally. No crackles or wheezes.  CVS: S1 &S2 heard. No murmur.  Regular rate and rhythm. Abdomen: Soft, distended abdomen with epigastric tenderness and abdominal mass. Extremities: No cyanosis, clubbing trace bilateral lower extremity edema.  Peripheral  pulses are palpable. Psych: Alert, awake and oriented, normal mood CNS:  No cranial nerve deficits.  Power equal in all extremities.  No sensory deficits noted.  No cerebellar signs.   Skin: Warm and dry.  No rashes noted.  Data Review: I have personally reviewed the following laboratory data and studies,  CBC: Recent Labs  Lab 02/09/20 1543 02/09/20 1543 02/10/20 0237 02/11/20 3235 02/11/20 2023 02/12/20 0213 02/13/20 0531  WBC 14.6*  --  11.0* 21.5*  --  20.0* 24.7*  HGB 7.6*   < > 6.4* 6.1* 7.7* 8.0* 7.6*  HCT 25.4*   < > 20.9* 20.2* 24.1* 25.0* 24.0*  MCV 91.7  --  92.5 93.1  --  91.9 92.7  PLT 163  --  160 157  --  167 124*   < > = values in this interval not displayed.   Basic Metabolic Panel: Recent Labs  Lab 02/09/20 1543 02/10/20 0237 02/11/20 0637 02/12/20 0213 02/13/20 0531  NA 135 135 135 136 134*  K 4.3 4.2 4.2 4.5 4.3  CL 104 105 107 110 106  CO2 21* 17* 11* 16* 16*  GLUCOSE 149* 142* 188* 169* 194*  BUN 38* 37* 32* 37* 32*  CREATININE 1.09 1.00 1.04  1.01 1.07  CALCIUM 8.9 8.2* 7.7* 7.3* 7.1*  MG  --   --  1.6* 2.3 2.4  PHOS  --   --  2.4*  --  1.4*   Liver Function Tests: Recent Labs  Lab 02/09/20 1543 02/10/20 0237 02/11/20 0637 02/13/20 0531  AST 31 31 34 156*  ALT 23 23 22  83*  ALKPHOS 99 89 66 63  BILITOT 1.3* 1.3* 1.4* 2.2*  PROT 8.5* 7.5 6.7 7.1  ALBUMIN 3.0* 2.6* 2.2* 2.6*   Recent Labs  Lab 02/09/20 1543  LIPASE 21   No results for input(s): AMMONIA in the last 168 hours. Cardiac Enzymes: No results for input(s): CKTOTAL, CKMB, CKMBINDEX, TROPONINI in the last 168 hours. BNP (last 3 results) Recent Labs    05/10/19 0545 01/16/20 0405  BNP 40.1 81.9    ProBNP (last 3 results) No results for input(s): PROBNP in the last 8760 hours.  CBG: No results for input(s): GLUCAP in the last 168 hours. Recent Results (from the past 240 hour(s))  Urine culture     Status: None   Collection Time: 02/09/20  7:48 PM   Specimen: Urine, Clean Catch  Result Value Ref Range Status   Specimen Description   Final    URINE, CLEAN CATCH Performed at [facility], [place] [address], [place], [place] [address]    Special Requests   Final    NONE Performed at [facility], [place] [address] [place], [place] [address]    Culture   Final    NO GROWTH Performed at [facility], [place] [address] [place], [place] [address]    Report Status 02/11/2020 FINAL  Final  Culture, blood (routine x 2)     Status: None (Preliminary result)   Collection Time: 02/09/20  9:04 PM   Specimen: BLOOD  Result Value Ref Range Status   Specimen Description   Final    BLOOD BLOOD LEFT FOREARM Performed at [place] [address], [place], [place] [address]    Special Requests   Final    BOTTLES DRAWN AEROBIC AND ANAEROBIC Blood Culture results may not be optimal due to an excessive volume of blood received in culture bottles Performed  at [facility], [place]  [address], [place], [place] [address]    Culture   Final    NO GROWTH 3 DAYS Performed at [facility], [place] [address] [place], [place] [address]    Report Status PENDING  Incomplete  Culture, blood (routine x 2)     Status: None (Preliminary result)   Collection Time: 02/09/20  9:45 PM   Specimen: BLOOD LEFT FOREARM  Result Value Ref Range Status   Specimen Description   Final    BLOOD LEFT FOREARM Performed at [place] [address] [place], [place] [address]    Special Requests   Final    BOTTLES DRAWN AEROBIC AND ANAEROBIC Blood Culture results may not be optimal due to an excessive volume of blood received in culture bottles Performed at [place] [address], [place], [place] [address]    Culture   Final    NO GROWTH 3 DAYS Performed at [facility], [place] [address] [place], [place] [address]    Report Status PENDING  Incomplete  SARS CORONAVIRUS 2 (TAT 6-24 HRS) Nasopharyngeal Nasopharyngeal Swab     Status: None   Collection Time: 02/10/20 12:10 AM   Specimen: Nasopharyngeal Swab  Result Value Ref Range Status   SARS Coronavirus 2 NEGATIVE NEGATIVE Final    Comment: (NOTE) SARS-CoV-2 target nucleic acids are NOT DETECTED. The SARS-CoV-2 RNA is generally detectable in upper and lower respiratory specimens during the acute phase of infection. Negative results do not preclude SARS-CoV-2 infection, do not rule out co-infections with other pathogens, and should not be used as the sole basis for treatment or other patient management decisions. Negative results must be combined with clinical observations, patient history, and epidemiological information. The expected result is Negative. Fact Sheet for Patients: SugarRoll.be Fact Sheet for Healthcare Providers: https://www.woods-mathews.com/ This test is not yet approved or cleared by the [place] FDA and  has been  authorized for detection and/or diagnosis of SARS-CoV-2 by FDA under an Emergency Use Authorization (EUA). This EUA will remain  in effect (meaning this test can be used) for the duration of the COVID-19 declaration under Section 56 4(b)(1) of the Act, 21 U.S.C. section 360bbb-3(b)(1), unless the authorization is terminated or revoked sooner. Performed at [facility], [place] [address], [place], [place] [address]   C Difficile Quick Screen w PCR reflex     Status: Abnormal   Collection Time: 02/11/20  3:00 PM   Specimen: STOOL  Result Value Ref Range Status   C Diff antigen POSITIVE (A) NEGATIVE Final   C Diff toxin NEGATIVE NEGATIVE Final   C Diff interpretation Results are indeterminate. See PCR results.  Final    Comment: Performed at [facility], [place] [address], [place],  [address]  Gastrointestinal Panel by PCR , Stool     Status: None   Collection Time: 02/11/20  3:00 PM   Specimen: STOOL  Result Value Ref Range Status   Campylobacter species NOT DETECTED NOT DETECTED Final   Plesimonas shigelloides NOT DETECTED NOT DETECTED Final   Salmonella species NOT DETECTED NOT DETECTED Final   Yersinia enterocolitica NOT DETECTED NOT DETECTED Final   Vibrio species NOT DETECTED NOT DETECTED Final   Vibrio cholerae NOT DETECTED NOT DETECTED Final   Enteroaggregative E coli (EAEC) NOT DETECTED NOT DETECTED Final   Enteropathogenic E coli (EPEC) NOT DETECTED NOT DETECTED Final   Enterotoxigenic E coli (ETEC)  NOT DETECTED NOT DETECTED Final   Shiga like toxin producing E coli (STEC) NOT DETECTED NOT DETECTED Final   Shigella/Enteroinvasive E coli (EIEC) NOT DETECTED NOT DETECTED Final   Cryptosporidium NOT DETECTED NOT DETECTED Final   Cyclospora cayetanensis NOT DETECTED NOT DETECTED Final   Entamoeba histolytica NOT DETECTED NOT DETECTED Final   Giardia lamblia NOT DETECTED NOT DETECTED Final   Adenovirus F40/41 NOT DETECTED NOT DETECTED Final    Astrovirus NOT DETECTED NOT DETECTED Final   Norovirus GI/GII NOT DETECTED NOT DETECTED Final   Rotavirus A NOT DETECTED NOT DETECTED Final   Sapovirus (I, II, IV, and V) NOT DETECTED NOT DETECTED Final    Comment: Performed at [facility], [address] [place], [place] [address]  C. Diff by PCR, Reflexed     Status: Abnormal   Collection Time: 02/11/20  3:00 PM  Result Value Ref Range Status   Toxigenic C. Difficile by PCR POSITIVE (A) NEGATIVE Final    Comment: Positive for toxigenic C. difficile with little to no toxin production. Only treat if clinical presentation suggests symptomatic illness. Performed at [facility], [place] [address] [place], [place] [address]      Studies: No results found.   Flora Lipps, MD  [facility] 02/13/2020

## 2020-02-13 NOTE — Progress Notes (Addendum)
PCCM progress note  Discussed with spouse [name], [name] at bedside and Brother [name] (over facetime).  Explained clinical situation that [name] has deteriorated through the day with severely elevated lactic acid, acidosis.  Prognosis is poor given baseline functional status, relapsed metastatic cancer.  Recommended DNR status as he would not survive intubation or CPR.  His family agrees to this. [name] was alert and participated in the conversation and is in agreement as well. We will continue supporting him with resuscitation, antibiotics. If he deteriorates then shift focus to comfort measures  His brother [name] is driving from [place] to be at bedside.  Herbert Garfinkel MD [facility] Please see [email or web address] for pager details.  02/13/2020, 8:48 PM

## 2020-02-13 NOTE — Consult Note (Signed)
NAME:  [name], MRN:  [id number], DOB:  [birth year], LOS: 4 ADMISSION DATE:  02/09/2020, CONSULTATION DATE:  02/13/2020 REFERRING MD:  Sherlon Handing MD, CHIEF COMPLAINT: Tachycardia, tachypnea, lactic acidosis  Brief History   60 year old with hypertension, gout, OSA, metastatic neuroendocrine carcinoma with liver mets presenting with abdominal, back pain, found to have mets to the spine. Initially admitted with sepsis secondary to C. difficile, GI bleed. Underwent EGD on 4/11 with findings of significant blood in the stomach but no obvious bleed noted.  Rapid response called on 4/12 for tachypnea, tachycardia, lactic acid greater than 11 and hemoglobin of 5.9.  Transfered to ICU and PCCM consulted  Past Medical History   Hypertension, OSA, gout, relapsed metastatic neuroendocrine carcinoma Cancer was diagnosed in December 2019.  Follows with Dr. Irene Limbo  [facility] Events   4/8 Admit 4/11 EGD 4/12 Transfer to ICU after rapid response  Consults:  PCCM, GI  Procedures:  EGD 4/12- A significant burden of old clotted blood was found in the gastric fundus and in the gastric body.   Significant Diagnostic Tests:  CT abdomen pelvis 02/09/2020-severe hepatomegaly with hepatic mets, osseous mets involving T12, L2, L4, soft tissue mass in pancreas. Over 2 hours were spent tediously removing clot with a variety of techniques until complete clearance was achieved. There was no fresh /red blood anywhere in the stomach. no active bleeding  Micro Data:  C. difficile 4/10-positive Urine cultures 4/8-no growth Blood cultures 4/8-no growth  Antimicrobials:  Cefepime 4/8-4/11 Flagyl 4/8-4/11  Zosyn 4/12 >> P.o. Vanco 4/11 >> IV Vanco 4/12 >>  Interim history/subjective:    Objective   Blood pressure (!) 148/55, pulse 91, temperature 98 F (36.7 C), temperature source Oral, resp. rate (!) 30, height 5\' 7"  (1.702 m), weight 72.1 kg, SpO2 100 %.        Intake/Output Summary  (Last 24 hours) at 02/13/2020 1928 Last data filed at 02/13/2020 1503 Gross per 24 hour  Intake 2662.65 ml  Output --  Net 2662.65 ml   Filed Weights   02/12/20 0700  Weight: 72.1 kg    Examination: Gen:      Chronically ill-appearing, moderate distress HEENT:  EOMI, sclera anicteric Neck:     No masses; no thyromegaly Lungs:    Clear to auscultation bilaterally; tachypnea CV:         Regular rate and rhythm; no murmurs Abd:      Distended abdomen, firm hepatomegaly, diminished bowel sounds Ext:    No edema; adequate peripheral perfusion Skin:      Warm and dry; no rash Neuro: alert and oriented x 3  Resolved Hospital Problem list     Assessment & Plan:  Severe lactic acidosis May be secondary to ongoing GI bleed though blood pressure is stable currently We will need to evaluate abdomen for any intra-abdominal ischemia given history of metastatic carcinoma, C. difficile infection He likely has significant hepatic dysfunction with poor clearance of lactic acid.  Volume resuscitate with blood, fluids-2 units PRBCs and 2 L normal saline ordered. Start pressors via chemo port if he becomes hemodynamically stable Repeat ABG, lactic acid, CMP and CBC  Severe sepsis C. difficile infection Continue p.o. Vanco Add IV vanco, zosyn.  Repeat blood cultures  Acute GI bleed, status post EGD with large clot in the gastric fundus Unable to clearly identify source of bleed. Transfuse PRBC Discussed with GI service.  May need repeat EGD based on review of CT abdomen Continue Protonix IV every 12  Goals of care Prognosis is extremely guarded. Discussed intubation, resuscitation with patient.  He is alert and wants everything done for now  Best practice:  Diet: NPO Pain/Anxiety/Delirium protocol (if indicated): PRN Dilaudid VAP protocol (if indicated):  NA DVT prophylaxis: No heparin due to GI bleed.  Place SCDs GI prophylaxis: PPI Glucose control: Monitor Mobility: Bed Code  Status: Full Family Communication: Patient updated Disposition: ICU  Labs   CBC: Recent Labs  Lab 02/10/20 0237 02/10/20 0237 02/11/20 0637 02/11/20 2023 02/12/20 0213 02/13/20 0531 02/13/20 1615  WBC 11.0*  --  21.5*  --  20.0* 24.7* 33.8*  HGB 6.4*   < > 6.1* 7.7* 8.0* 7.6* 5.9*  HCT 20.9*   < > 20.2* 24.1* 25.0* 24.0* 19.8*  MCV 92.5  --  93.1  --  91.9 92.7 96.6  PLT 160  --  157  --  167 124* 212   < > = values in this interval not displayed.    Basic Metabolic Panel: Recent Labs  Lab 02/09/20 1543 02/10/20 0237 02/11/20 0637 02/12/20 0213 02/13/20 0531  NA 135 135 135 136 134*  K 4.3 4.2 4.2 4.5 4.3  CL 104 105 107 110 106  CO2 21* 17* 11* 16* 16*  GLUCOSE 149* 142* 188* 169* 194*  BUN 38* 37* 32* 37* 32*  CREATININE 1.09 1.00 1.04 1.01 1.07  CALCIUM 8.9 8.2* 7.7* 7.3* 7.1*  MG  --   --  1.6* 2.3 2.4  PHOS  --   --  2.4*  --  1.4*   GFR: Estimated Creatinine Clearance: 69.5 mL/min (by C-G formula based on SCr of 1.07 mg/dL). Recent Labs  Lab 02/09/20 2150 02/10/20 0237 02/10/20 1538 02/11/20 0637 02/11/20 2356 02/12/20 0213 02/13/20 0531 02/13/20 1615  PROCALCITON 0.83  --   --   --   --   --   --   --   WBC  --    < >  --  21.5*  --  20.0* 24.7* 33.8*  LATICACIDVEN 3.3*   < >   < >  --  3.2* 3.4* 3.7* >11.0*   < > = values in this interval not displayed.    Liver Function Tests: Recent Labs  Lab 02/09/20 1543 02/10/20 0237 02/11/20 0637 02/13/20 0531  AST 31 31 34 156*  ALT 23 23 22  83*  ALKPHOS 99 89 66 63  BILITOT 1.3* 1.3* 1.4* 2.2*  PROT 8.5* 7.5 6.7 7.1  ALBUMIN 3.0* 2.6* 2.2* 2.6*   Recent Labs  Lab 02/09/20 1543  LIPASE 21   No results for input(s): AMMONIA in the last 168 hours.  ABG    Component Value Date/Time   TCO2 26 06/15/2012 1832     Coagulation Profile: Recent Labs  Lab 02/09/20 2150  INR 0.8    Cardiac Enzymes: No results for input(s): CKTOTAL, CKMB, CKMBINDEX, TROPONINI in the last 168  hours.  HbA1C: Hgb A1c MFr Bld  Date/Time Value Ref Range Status  02/18/2012 12:54 PM 6.7 (H) <5.7 % Final    Comment:    (NOTE)                                                                       According  to the ADA Clinical Practice Recommendations for 2011, when HbA1c is used as a screening test:  >=6.5%   Diagnostic of Diabetes Mellitus           (if abnormal result is confirmed) 5.7-6.4%   Increased risk of developing Diabetes Mellitus References:Diagnosis and Classification of Diabetes Mellitus,Diabetes ZTIW,5809,98(PJASN 1):S62-S69 and Standards of Medical Care in         Diabetes - 2011,Diabetes KNLZ,7673,41 (Suppl 1):S11-S61.    CBG: Recent Labs  Lab 02/13/20 1753  GLUCAP 151*    Review of Systems:   REVIEW OF SYSTEMS:   All negative; except for those that are bolded, which indicate positives.  Constitutional: weight loss, weight gain, night sweats, fevers, chills, fatigue, weakness.  HEENT: headaches, sore throat, sneezing, nasal congestion, post nasal drip, difficulty swallowing, tooth/dental problems, visual complaints, visual changes, ear aches. Neuro: difficulty with speech, weakness, numbness, ataxia. CV:  chest pain, orthopnea, PND, swelling in lower extremities, dizziness, palpitations, syncope.  Resp: cough, hemoptysis, dyspnea, wheezing. GI: heartburn, indigestion, abdominal pain, nausea, vomiting, diarrhea, constipation, change in bowel habits, loss of appetite, hematemesis, melena, hematochezia.  GU: dysuria, change in color of urine, urgency or frequency, flank pain, hematuria. MSK: joint pain or swelling, decreased range of motion. Psych: change in mood or affect, depression, anxiety, suicidal ideations, homicidal ideations. Skin: rash, itching, bruising.  Past Medical History  He,  has a past medical history of Arthritis, Cancer ([place]), Complication of anesthesia, Gout, Heart murmur, Hypertension, and Near syncope.   Surgical History    Past  Surgical History:  Procedure Laterality Date  . ESOPHAGOGASTRODUODENOSCOPY (EGD) WITH PROPOFOL N/A 02/12/2020   Procedure: ESOPHAGOGASTRODUODENOSCOPY (EGD) WITH PROPOFOL;  Surgeon: Yetta Flock, MD;  Location: [facility];  Service: Gastroenterology;  Laterality: N/A;  . FOREIGN BODY REMOVAL  02/12/2020   Procedure: FOREIGN BODY REMOVAL;  Surgeon: Yetta Flock, MD;  Location: [facility];  Service: Gastroenterology;;  . IR IMAGING GUIDED PORT INSERTION  10/26/2018  . NO PAST SURGERIES       Social History   reports that he has never smoked. He has never used smokeless tobacco. He reports previous alcohol use. He reports that he does not use drugs.   Family History   His family history includes Breast cancer in his cousin; Cancer in his mother; Diabetes in his paternal uncle; Heart disease in his paternal uncle; Prostate cancer in his father.   Allergies Allergies  Allergen Reactions  . Remdesivir Other (See Comments)    Chest tightness  . Chlorhexidine     PORT not accessed     Home Medications  Prior to Admission medications   Medication Sig Start Date End Date Taking? Authorizing Provider  apixaban (ELIQUIS) 2.5 MG TABS tablet Take 1 tablet (2.5 mg total) by mouth 2 (two) times daily. 01/19/20 02/18/20 Yes Harold Hedge, MD  ferrous sulfate 325 (65 FE) MG tablet Take 1 tablet (325 mg total) by mouth daily with breakfast. 01/19/20  Yes Harold Hedge, MD  hydrOXYzine (ATARAX/VISTARIL) 25 MG tablet Take 1 tablet (25 mg total) by mouth every 6 (six) hours. 02/06/20  Yes Darr, Marguerita Beards, PA-C  LORazepam (ATIVAN) 0.5 MG tablet Take 1 tablet (0.5 mg total) by mouth every 6 (six) hours as needed (Nausea or vomiting). 01/16/20  Yes Brunetta Genera, MD  ondansetron (ZOFRAN) 8 MG tablet Take 1 tablet (8 mg total) by mouth 2 (two) times daily as needed for refractory nausea / vomiting. Start on day 3 after carboplatin  chemo. 10/06/19  Yes Brunetta Genera, MD  oxyCODONE (OXY  IR/ROXICODONE) 5 MG immediate release tablet Take 1-2 tablets (5-10 mg total) by mouth every 4 (four) hours as needed for severe pain. 02/08/20  Yes Brunetta Genera, MD  carvedilol (COREG) 6.25 MG tablet Take 1 tablet (6.25 mg total) by mouth 2 (two) times daily with a meal. Patient not taking: Reported on 02/09/2020 01/20/20   Harold Hedge, MD     Critical care time:     The patient is critically ill with multiple organ system failure and requires high complexity decision making for assessment and support, frequent evaluation and titration of therapies, advanced monitoring, review of radiographic studies and interpretation of complex data.   Critical Care Time devoted to patient care services, exclusive of separately billable procedures, described in this note is 38minutes.   Marshell Garfinkel MD [facility] Please see [email or web address] for pager details.  02/13/2020, 7:56 PM

## 2020-02-13 NOTE — Anesthesia Preprocedure Evaluation (Deleted)
Anesthesia Evaluation    Reviewed: Allergy & Precautions, Patient's Chart, lab work & pertinent test results  History of Anesthesia Complications Negative for: history of anesthetic complications  Airway        Dental  (+) Poor Dentition, Dental Advisory Given   Pulmonary neg pulmonary ROS,    Pulmonary exam normal        Cardiovascular hypertension, Pt. on medications      Neuro/Psych negative neurological ROS     GI/Hepatic Neg liver ROS, Liver mets   Endo/Other  negative endocrine ROS  Renal/GU negative Renal ROS     Musculoskeletal negative musculoskeletal ROS (+) Bone Mets   Abdominal   Peds  Hematology negative hematology ROS (+) anemia ,   Anesthesia Other Findings Day of surgery medications reviewed with the patient.  Reproductive/Obstetrics                             Anesthesia Physical  Anesthesia Plan  ASA: III  Anesthesia Plan: General   Post-op Pain Management:    Induction: Intravenous  PONV Risk Score and Plan: 2 and Ondansetron and Dexamethasone  Airway Management Planned: Oral ETT  Additional Equipment:   Intra-op Plan:   Post-operative Plan: Extubation in OR  Informed Consent:   Plan Discussed with:   Anesthesia Plan Comments:         Anesthesia Quick Evaluation

## 2020-02-13 NOTE — Significant Event (Signed)
Rapid Response Event Note  Overview: Time Called: 5329 Arrival Time: 1550 Event Type: Respiratory  Initial Focused Assessment:  RRT called due to new onset tachycardia and tachypnea. On arrival patient lying in bed, short of breath but no complaints of chest pain or tightness. Alert and oriented. Complaints of chills. Patient appears jaundice, bedside RN states patient is pale compared to earlier in the day. Mucus membranes dry and pale. Will order CBC and Lactic Acid. Patient refused supplemental O2 but O2 saturation 100% on room air. Patient anxious concerned about interventions potentially causing him pain. Lung sounds diminished in lower bases. MD in route per bedside RN. Patient has history of liver mass and abdomen is distended and taut. Possibly pushing up on diaphragm. Respiratory rate 30s-40s. Oxygenating well on room air. 5 Lead EKG showing sinus tach, will get 12 lead as well. Patient not diabetic. Past CBGs elevated. Previous lactic overnight 3.7, lactic prior was 3.4 and patient had fluid resuscitation. MD at bedside to assess. Plan of care as stated below. 12 lead obtained unable to get EKG without significant respiratory artifact. Ativan administered, patient pulled up in bed to attempt and relieve abdominal pressure on chest, lab and Xray called to get scans and samples. Respiratory rate slowed after Ativan admin. Patient more relaxed but remains tachypnic and tachycardic. Phlebotomy at bedside to draw labs. Xray completed. Patient now calm and resting. Patients main concern is if he is going to be okay. MD discussing plan of care with patient. Heart rate, blood pressure, respirations all decreased. Patient became confused throughout RRT event, patient now very weak as well. Chest Xray showed low lung volumes. Patient no longer able to tell me where we are, what day it is, or the situation just repeats his birthday. No facial droop or unequal weakness, pupils remain even and reactive.  Slightly drowsy but awakes to movement in room and speech. MD paged with new assessment findings and recommendation of SD Vs Tele for patient safety and for new decompensation. MD placed orders for transfer to telemetry as well as an additional 500 ml bolus. Lab called due to Hgb showing 5.9 down from 7.6. Phlebotomy asked to come redraw, MD notified of result and plan to re-draw. MD ordering 2 U PRBCs STAT, patient already typed and screened. Patient getting increasingly more confused and lethargic. Patient trying to climb out of bed and removed gown and telemetry leads. Unable to reorient. MD notified of drop in BP as well as new mental status. Plan to now transfer to stepdown. RRT available for any additional concerns. All vital signs saved in chart and validated.   Plan of Care (if not transferred):  12 Lead EKG @ 1625 Lactic Acid STAT @ 1615 CBC STAT @ 1615 Chest Xray @ 1618 NS Bolus 500 mL Completed @ 1615 1 mg Ativan @ 1555 NS Bolus @ 1700 Critical value @ 1711 MD notified Lab re-sticking to verify Hgb Transfer to telemetry  Event Summary: Name of Physician Notified: Pokhrel, L. MD at 1550    at    Outcome: Stayed in room and stabalized     [place]

## 2020-02-13 NOTE — Progress Notes (Signed)
Pharmacy Antibiotic Note  [name] is a 59 y.o. male admitted on 02/09/2020 with sepsis. Currently being treated with PO Vancomycin for possible C.diff infection. Rapid response called today due to tachypnea and tachycardia. Resuming IV antibiotics per MD. Pharmacy has been consulted for Vancomycin and Zosyn dosing.  Plan:  Vancomycin 1500mg  IV x 1, then 750mg  IV q12h.  Plan for Vancomycin levels at steady state, as indicated.  Zosyn 3.375g IV x 1 over 30 minutes, then Zosyn 3.375g IV q8h (each dose infused over 4 hours).   Monitor renal function (daily SCr while on Vancomycin/Zosyn combination), cultures, clinical course.    Height: 5\' 7"  (170.2 cm) Weight: 72.1 kg (159 lb) IBW/kg (Calculated) : 66.1  Temp (24hrs), Avg:97.9 F (36.6 C), Min:97 F (36.1 C), Max:99.3 F (37.4 C)  Recent Labs  Lab 02/09/20 1543 02/09/20 1945 02/10/20 0237 02/10/20 0539 02/10/20 1538 02/11/20 0637 02/11/20 2356 02/12/20 0213 02/13/20 0531 02/13/20 1615  WBC 14.6*  --  11.0*  --   --  21.5*  --  20.0* 24.7* 33.8*  CREATININE 1.09  --  1.00  --   --  1.04  --  1.01 1.07  --   LATICACIDVEN  --    < > 3.2*   < > 3.5*  --  3.2* 3.4* 3.7* >11.0*   < > = values in this interval not displayed.    Estimated Creatinine Clearance: 69.5 mL/min (by C-G formula based on SCr of 1.07 mg/dL).    Allergies  Allergen Reactions  . Remdesivir Other (See Comments)    Chest tightness  . Chlorhexidine     PORT not accessed    Antimicrobials this admission: 4/8 Cefepime >> 4/11 4/9 Vancomycin (IV) >> 4/11, resumed 4/12 >> 4/9 Flagyl >> 4/11 4/11 Vancomycin PO >> (4/21) 4/12 Zosyn >>  Microbiology results: 4/8 BCx: NGTD 4/8 UCx: NGF 4/9 COVID: negative  4/10 C.diff quick screen: antigen positive, toxin negative 4/10 C.diff by PCR, reflexed: positive  4/10 GI panel: negative 4/12 BCx: ordered 4/12 UCx: ordered   Thank you for allowing pharmacy to be a part of this patient's care.   Lindell Spar, PharmD, BCPS Clinical Pharmacist  02/13/2020 7:11 PM

## 2020-02-13 NOTE — Progress Notes (Addendum)
[facility] Progress Note  CC:  GI bleed/melena   Subjective: He had 3 loose to watery brown stools last night and 3 similar loose stools this morning. No black diarrhea. No rectal bleeding. No N/V. No current abdominal pain.    Objective:   S/P EGD by Dr. Havery Moros 02/12/2020: - Esophagogastric landmarks identified. - Suspect ulceration at GEJ vs. variant of blanched mucosa as outlined above. - Normal esophagus otherwise - Clotted blood in the gastric fundus and in the gastric body. Significant time spent to clear   the stomach - Extrinsic compression vs. subepithelial lesion in the fundus with friable tissue in the   fundus in general but no high risk stigmata or bleeding visualized - Normal stomach otherwise - Normal duodenal bulb and second portion of the duodenum.   Difficult exam - no active bleeding, unclear true source of bleeding - possible GEJ ulcer vs.   variant of mucosa. Changes in the fundus noted but no active bleeding or stigmata of  Abdominal/pelvic CT with contrast 02/09/2020: 1. Marked severity hepatomegaly with numerous heterogeneous low-attenuation liver lesions of various sizes, consistent with hepatic metastasis. 2. Findings consistent with osseous metastasis involving the T12, L2, L4 vertebral bodies and left iliac bone. 3. Low-attenuation soft tissue masses within the pancreas which may represent a primary malignancy. Aortic Atherosclerosis    Vital signs in last 24 hours: Temp:  [97.6 F (36.4 C)-99.3 F (37.4 C)] 98.3 F (36.8 C) (04/12 0510) Pulse Rate:  [82-119] 92 (04/12 0510) Resp:  [14-39] 14 (04/12 0510) BP: (141-208)/(65-83) 152/67 (04/12 0510) SpO2:  [100 %] 100 % (04/12 0510) Last BM Date: 02/12/20 General:   Alert and awake in NAD.  Eyes:  Sclera non-icteric. Conjunctiva pink.  Heart: RRR, no murmur or rub.  Pulm:  Few crackles LLL otherwise clear throughout.  Abdomen: RUQ distended and firm, gross hepatomegaly,  mild epigastric tenderness without rebound or guarding. ? ascites.  Extremities:  Without edema. Neurologic:  Alert and  oriented x4;  grossly normal neurologically. Psych:  Alert and cooperative. Normal mood and affect.  Intake/Output from previous day: 04/11 0701 - 04/12 0700 In: 2087 [I.V.:2016.4; IV Piggyback:70.6] Out: 175 [Urine:175] Intake/Output this shift: No intake/output data recorded.  Lab Results: Recent Labs    02/11/20 0637 02/11/20 0637 02/11/20 2023 02/12/20 0213 02/13/20 0531  WBC 21.5*  --   --  20.0* 24.7*  HGB 6.1*   < > 7.7* 8.0* 7.6*  HCT 20.2*   < > 24.1* 25.0* 24.0*  PLT 157  --   --  167 124*   < > = values in this interval not displayed.   BMET Recent Labs    02/11/20 0637 02/12/20 0213 02/13/20 0531  NA 135 136 134*  K 4.2 4.5 4.3  CL 107 110 106  CO2 11* 16* 16*  GLUCOSE 188* 169* 194*  BUN 32* 37* 32*  CREATININE 1.04 1.01 1.07  CALCIUM 7.7* 7.3* 7.1*   LFT Recent Labs    02/13/20 0531  PROT 7.1  ALBUMIN 2.6*  AST 156*  ALT 83*  ALKPHOS 63  BILITOT 2.2*   PT/INR No results for input(s): LABPROT, INR in the last 72 hours. Hepatitis Panel No results for input(s): HEPBSAG, HCVAB, HEPAIGM, HEPBIGM in the last 72 hours.  No results found.  Assessment / Plan:  52. 59 year old male with metastatic neuroendocrine carcinoma to the liver, spine and possibly to the pancreas admitted to the hospital with fever, N/V/D,  GI bleed/melena and anemia. Admission Hg 6.4. He received 3 units of PRBCs 4/10-4/11.  Hg 7.7 ->8.0 -> 7.6. S/P EGD 4/11 identified a suspected ulcer at the GEJ vs blanching effect from compression, a significant burden of old clotted blood was found in the gastric fundus and gastric body which required intubation and 2 hours for clot removal until complete clearance was achieved. No active bleeding. Unclear true source of bleeding. Remains on Pantoprazole infusion 8mg /hr. Plan for repeat EGD if needed.  Today Hg 7.6. BUN 32.  T. Bili 2.2. Hemodynamically stable. BP 152/67. HR 92.  -Continue NS @ 100cc/hr -Continue Pantoprazole 8mg /hr infusion  -Zofran 4mg  IV Q 6 hrs PRN -Repeat CBC in am.  -Clear liquid diet -NPO after midnight for EGD 4/13 -if melena recurs check stat H/H  2. Sepsis.  Cefepime and  Metronidazole discontinued 4/11. Blood cultures 4/8 no growth at 48 hrs. Urine culture negative. Increasing WBC 20 -> 24.7. He remains afebrile.   3. C. Diff PCR +.  Remains on oral Vancomycin 125mg  po QID.    4. Hypophosphatemia. Today Phos 1.4. -Management per the hospitalist   4.  COVID 19 + 01/15/2020  Further recommendations per Dr. 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 I agree with the Advanced Practitioner's note, impression and recommendations.   Metastatic neuroendocrine tumor  Acute GI bleed, status post EGD yesterday with large clot in gastric fundus.  Unable to identify clear source of upper GI bleeding after removal of clots from gastric fundus.  Hemoglobin relatively stable since yesterday 7.7-->8--->7.6. We will continue to monitor. Consider repeat EGD if hemoglobin down trends with evidence of ongoing GI bleeding  Continue clear liquids, can advance diet tomorrow if no further bleeding Continue PPI twice daily IV  Monitor H&H every 12 hours  Continue oral vancomycin 125 mg every 6 hours for C. Difficile  Sepsis with leukocytosis was on broad-spectrum antibiotics, culture negative     K. Denzil Magnuson , MD [phone]     Addendum:  Page from [facility], patient transferred to ICU  Lactic acid elevated >11 WBC 33 Hgb 5.9  BP  stable but he is tachycardic and has tachypnea  Diarrhea with melena C.diff Ag positive, negative toxin on Oral vancomycin  Currently not on any other antibiotics  Restart Broad spectrum antibiotics Transfuse to Hgb >7 Repeat CT chest, abd & pelvis with contrast to identify possible source of sepsis, will also need to exclude ischemic colitis or mesenteric ischemia  Raliegh Ip Denzil Magnuson , MD [phone]

## 2020-02-14 ENCOUNTER — Encounter (HOSPITAL_COMMUNITY)
Admission: EM | Disposition: A | Discharge status: Hospice | Payer: Self-pay | Source: Home / Self Care | Attending: Internal Medicine

## 2020-02-14 LAB — BASIC METABOLIC PANEL
Anion gap: 16 — ABNORMAL HIGH (ref 5–15)
Anion gap: 11 (ref 5–15)
BUN: 31 mg/dL — ABNORMAL HIGH (ref 6–20)
BUN: 39 mg/dL — ABNORMAL HIGH (ref 6–20)
CO2: 8 mmol/L — ABNORMAL LOW (ref 22–32)
CO2: 14 mmol/L — ABNORMAL LOW (ref 22–32)
Calcium: 5.9 mg/dL — CL (ref 8.9–10.3)
Calcium: 6.5 mg/dL — ABNORMAL LOW (ref 8.9–10.3)
Chloride: 110 mmol/L (ref 98–111)
Chloride: 111 mmol/L (ref 98–111)
Creatinine, Ser: 1.3 mg/dL — ABNORMAL HIGH (ref 0.61–1.24)
Creatinine, Ser: 1.38 mg/dL — ABNORMAL HIGH (ref 0.61–1.24)
GFR calc Af Amer: >60 mL/min (ref >60)
GFR calc Af Amer: >60 mL/min (ref >60)
GFR calc non Af Amer: 60 mL/min — ABNORMAL LOW (ref >60)
GFR calc non Af Amer: 56 mL/min — ABNORMAL LOW (ref >60)
Glucose, Bld: 170 mg/dL — ABNORMAL HIGH (ref 70–99)
Glucose, Bld: 178 mg/dL — ABNORMAL HIGH (ref 70–99)
Potassium: 5.3 mmol/L — ABNORMAL HIGH (ref 3.5–5.1)
Potassium: 4 mmol/L (ref 3.5–5.1)
Sodium: 134 mmol/L — ABNORMAL LOW (ref 135–145)
Sodium: 136 mmol/L (ref 135–145)

## 2020-02-14 LAB — BPAM RBC
Blood Product Expiration Date: 202104232359
Blood Product Expiration Date: 202104232359
Blood Product Expiration Date: 202104232359
Blood Product Expiration Date: 202104272359
Blood Product Expiration Date: 202104272359
ISSUE DATE / TIME: 202104091021
ISSUE DATE / TIME: 202104101027
ISSUE DATE / TIME: 202104101359
ISSUE DATE / TIME: 202104121852
ISSUE DATE / TIME: 202104122233
Unit Type and Rh: 6200
Unit Type and Rh: 6200
Unit Type and Rh: 6200
Unit Type and Rh: 6200
Unit Type and Rh: 6200

## 2020-02-14 LAB — TYPE AND SCREEN
ABO/RH(D): A POS
Antibody Screen: NEGATIVE
Unit division: 0
Unit division: 0
Unit division: 0
Unit division: 0
Unit division: 0

## 2020-02-14 LAB — CBC
HCT: 23.4 % — ABNORMAL LOW (ref 39.0–52.0)
Hemoglobin: 7.7 g/dL — ABNORMAL LOW (ref 13.0–17.0)
MCH: 29.7 pg (ref 26.0–34.0)
MCHC: 32.9 g/dL (ref 30.0–36.0)
MCV: 90.3 fL (ref 80.0–100.0)
Platelets: 158 10*3/uL (ref 150–400)
RBC: 2.59 MIL/uL — ABNORMAL LOW (ref 4.22–5.81)
RDW: 16.9 % — ABNORMAL HIGH (ref 11.5–15.5)
WBC: 24.4 10*3/uL — ABNORMAL HIGH (ref 4.0–10.5)
nRBC: 0.3 % — ABNORMAL HIGH (ref 0.0–0.2)

## 2020-02-14 LAB — HEPATIC FUNCTION PANEL
ALT: 163 U/L — ABNORMAL HIGH (ref 0–44)
AST: 352 U/L — ABNORMAL HIGH (ref 15–41)
Albumin: 2.1 g/dL — ABNORMAL LOW (ref 3.5–5.0)
Alkaline Phosphatase: 62 U/L (ref 38–126)
Bilirubin, Direct: 2.2 mg/dL — ABNORMAL HIGH (ref 0.0–0.2)
Indirect Bilirubin: 1.3 mg/dL — ABNORMAL HIGH (ref 0.3–0.9)
Total Bilirubin: 3.5 mg/dL — ABNORMAL HIGH (ref 0.3–1.2)
Total Protein: 6 g/dL — ABNORMAL LOW (ref 6.5–8.1)

## 2020-02-14 LAB — PROTIME-INR
INR: 3 — ABNORMAL HIGH (ref 0.8–1.2)
Prothrombin Time: 30.9 seconds — ABNORMAL HIGH (ref 11.4–15.2)

## 2020-02-14 LAB — APTT: aPTT: 36 seconds (ref 24–36)

## 2020-02-14 LAB — LACTIC ACID, PLASMA: Lactic Acid, Venous: 3.3 mmol/L (ref 0.5–1.9)

## 2020-02-14 LAB — MRSA PCR SCREENING: MRSA by PCR: NEGATIVE

## 2020-02-14 LAB — PHOSPHORUS: Phosphorus: 3.6 mg/dL (ref 2.5–4.6)

## 2020-02-14 LAB — HEMOGLOBIN AND HEMATOCRIT, BLOOD
HCT: 24 % — ABNORMAL LOW (ref 39.0–52.0)
HCT: 22.7 % — ABNORMAL LOW (ref 39.0–52.0)
Hemoglobin: 7.8 g/dL — ABNORMAL LOW (ref 13.0–17.0)
Hemoglobin: 7.5 g/dL — ABNORMAL LOW (ref 13.0–17.0)

## 2020-02-14 LAB — MAGNESIUM: Magnesium: 2.4 mg/dL (ref 1.7–2.4)

## 2020-02-14 SURGERY — CANCELLED PROCEDURE

## 2020-02-14 SURGERY — EGD (ESOPHAGOGASTRODUODENOSCOPY)
Anesthesia: Moderate Sedation

## 2020-02-14 MED ORDER — CALCIUM GLUCONATE-NACL 2-0.675 GM/100ML-% IV SOLN
2.0000 g | Freq: Once | INTRAVENOUS | Status: AC
  Administered 2020-02-14: 2000 mg via INTRAVENOUS
  Filled 2020-02-14: qty 100

## 2020-02-14 MED ORDER — SODIUM POLYSTYRENE SULFONATE 15 GM/60ML PO SUSP
30.0000 g | Freq: Once | ORAL | Status: AC
  Administered 2020-02-14: 30 g via RECTAL
  Filled 2020-02-14: qty 120

## 2020-02-14 MED ORDER — SODIUM CHLORIDE 0.9% IV SOLUTION: Freq: Once | INTRAVENOUS | Status: DC

## 2020-02-14 MED ORDER — PANTOPRAZOLE SODIUM 40 MG IV SOLR
40.0000 mg | Freq: Two times a day (BID) | INTRAVENOUS | Status: DC
  Administered 2020-02-14 – 2020-02-16 (×5): 40 mg via INTRAVENOUS
  Filled 2020-02-14 (×5): qty 40

## 2020-02-14 MED ORDER — VANCOMYCIN HCL 1250 MG/250ML IV SOLN
1250.0000 mg | INTRAVENOUS | Status: DC
  Administered 2020-02-15: 1250 mg via INTRAVENOUS
  Filled 2020-02-14: qty 250

## 2020-02-14 MED ORDER — ACETAMINOPHEN 325 MG PO TABS
650.0000 mg | ORAL_TABLET | Freq: Once | ORAL | Status: AC
  Administered 2020-02-14: 650 mg via ORAL
  Filled 2020-02-14: qty 2

## 2020-02-14 MED ORDER — IBUPROFEN 100 MG/5ML PO SUSP
200.0000 mg | Freq: Once | ORAL | Status: AC
  Administered 2020-02-14: 200 mg via ORAL
  Filled 2020-02-14: qty 10

## 2020-02-14 MED ORDER — DIPHENHYDRAMINE HCL 50 MG/ML IJ SOLN
INTRAMUSCULAR | Status: AC
  Filled 2020-02-14: qty 1

## 2020-02-14 MED ORDER — MIDAZOLAM HCL (PF) 5 MG/ML IJ SOLN
INTRAMUSCULAR | Status: AC
  Filled 2020-02-14: qty 2

## 2020-02-14 MED ORDER — FENTANYL CITRATE (PF) 100 MCG/2ML IJ SOLN
INTRAMUSCULAR | Status: AC
  Filled 2020-02-14: qty 4

## 2020-02-14 SURGICAL SUPPLY — 15 items

## 2020-02-14 NOTE — Progress Notes (Signed)
MD notified of temp 100.5. Motrin ordered, told to hold FFP for now. Temperature increased to 100.9 after Motrin given. Ice packs applied. MD notified, ordered to hold FFP until temp decreases. Will continue to monitor.

## 2020-02-14 NOTE — Plan of Care (Signed)
  Problem: Clinical Measurements: Goal: Diagnostic test results will improve Outcome: Progressing Goal: Respiratory complications will improve Outcome: Not Progressing

## 2020-02-14 NOTE — Progress Notes (Signed)
CRITICAL VALUE ALERT  Critical Value:  Calcium 5.9  Date & Time Notied:  02/14/2020 0100  Provider Notified: E-Link  Orders Received/Actions taken: see new orders.

## 2020-02-14 NOTE — Progress Notes (Signed)
[facility] Progress Note Patient Name: [name] DOB: 06-Dec-1960 MRN: [id number]   Date of Service  02/14/2020  HPI/Events of Note  Multiple issues: 1. K+ = 5.3, 2. Ca++ = 5.9 which corrects to 7.02 (Low) given albumin = 2.6 and 3. INR = 3.0.   eICU Interventions  Will order: 1. Kayexalate 30 gm per rectum now.  2. Replace Ca++. 3. Transfuse 2 units FFP now.      Intervention Category Major Interventions: Other:;Electrolyte abnormality - evaluation and management  Nataliya Graig [place] 02/14/2020, 1:23 AM

## 2020-02-14 NOTE — Progress Notes (Addendum)
PROGRESS NOTE  [name] FAO:[id number] DOB: December 29, 1960 DOA: 02/09/2020 PCP: Patient, No Pcp Per   LOS: 5 days   Brief narrative: As per HPI,  [name] is a 59 y.o. 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 Patient reports he was diagnosed with liver cancer in Nov 2019. He has been following with Dr. Irene Limbo since that time and completed chemotherapy. Mets to back was already a suspicion. Over the last couple days reports vague abdominal and back pain.  He also reported nausea and vomiting but has been having multiple episodes of loose stools as well.  In the ED:  Patient was tachycardic, febrile but overall stable on room air. Labs remarkable for WBC 14.6, Hgb 7.6 and Lactate 3.7. Lipase, CMP and UA WNL. CXR: Low lung volumes. Right base atelectasis. Findings similar to prior study.  CT scan of the abdomen pelvis showed marked severity hepatomegaly with numerous heterogeneous low-attenuation liver lesions of various sizes, consistent with hepatic metastasis.. Findings consistent with osseous metastasis involving the T12, L2, L4 vertebral bodies and left iliac bone. Low-attenuation soft tissue masses within the pancreas which may represent a primary malignancy. Patient was given Zofran, Dilaudid and IVF.   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  He was initially on broad-spectrum  antibiotic but subsequently had multiple episodes of melena and C. difficile PCR was positive so he was briefly discontinued off antibiotic.  Patient subsequently had significantly elevated lactate so was restarted on broad-spectrum antibiotics including vancomycin and Zosyn since 02/13/2020.  Blood cultures from 02/13/2020 - so far.  Urinalysis and chest x-ray without any obvious source of infection.  Continue p.o. vancomycin.  Patient was last seen by Dr. Irene Limbo on 12/19/2019.    Patient with history of recent COVID-19 infection.   acute blood loss melena.  Hemoglobin of 6.4 on presentation.  Received of 3 units of packed RBC initially.  Patient had significant drop in his hemoglobin to 5.9 yesterday and was diaphoretic so was transferred to the stepdown unit.  Seen by critical care for lactate more than 11.  Patient received volume resuscitation including PRBC with improvement in his lactate levels.  GI on board and initial endoscopy was a difficult exam with no active bleeding but possible gastroesophageal junction ulcer versus variant of mucosa.  Patient was advised clear liquids and IV Protonix twice daily.  GI i was planning to do repeat endoscopy but today the patient as well as the family did not wish to undergo this procedure due risk of sedation and deterioration of medical condition.   diarrhea.  Possible C. difficile infection.  GI pathogen panel was negative.  On oral vancomycin.  Empirically on broad-spectrum antibiotic due to possibility of sepsis as well..   abdominal pain, back pain.  Likely secondary to metastatic cancer. On oxycodone po and iv dilaudid for severe pain.  Hypomagnesemia.  Improved with replacement.  Latest magnesium of 2.4.  Hypophosphatemia.  Replenished.  Phosphorus of 3.6.  Gout Hold prednisone for now.  No acute flare..  Essential HTN Supposed to be on Coreg but not taking it due to some kind of side effect.  Continue as needed hydralazine.  Metoprolol was  discontinued.  Will closely monitor   VTE Prophylaxis:  SCD due to GI bleed.  Code Status: DNR.  Family Communication: I had a prolonged discussion with the patient, patient's spouse, patient's brother at bedside regarding goals of care and further plan of care.  At this time, patient and the family is leaning towards nonaggressive care, wishing to discuss about palliative care options.  No plan for further endoscopy and if there is deterioration of the clinical condition, plan for comfort care as per discussion with critical care team.  Will consult palliative care.  Prognosis of the patient is poor due to advanced malignancy failure to thrive, severe GI bleed.  Disposition Plan:  . Patient is from home . Likely home but if deteriorated will be transition to comfort care.  Will consult palliative care. . Barriers to discharge:  sepsis, significant GI bleed with acute blood loss anemia, advanced cancer, needs closer monitoring, pending palliative care consultation  Consultants:  GI  PCCM  Palliative care consulted  Procedures:  Transfusion of packed RBC  Upper GI endoscopy 02/12/2020  Antibiotics:  . Vancomycin, metronidazole and cefepime- stopped 4/11 . Po Vancomycin 4/11> . Vancomycin, Zosyn 02/13/2020>>  Anti-infectives (From admission, onward)   Start     Dose/Rate Route Frequency Ordered Stop   02/15/20 0230  vancomycin (VANCOREADY) IVPB 1250 mg/250 mL     1,250 mg 166.7 mL/hr over 90 Minutes Intravenous Every 24 hours 02/14/20 1333     02/14/20 0800  vancomycin (VANCOREADY) IVPB 750 mg/150 mL  Status:  Discontinued     750 mg 150 mL/hr over 60 Minutes Intravenous Every 12 hours 02/13/20 1910 02/14/20 1333   02/14/20 0400  piperacillin-tazobactam (ZOSYN) IVPB 3.375 g     3.375 g 12.5 mL/hr over 240 Minutes Intravenous Every 8 hours 02/13/20 1858     02/13/20 1915  vancomycin (VANCOREADY) IVPB 1500 mg/300 mL     1,500 mg 150 mL/hr over 120 Minutes Intravenous STAT  02/13/20 1908 02/13/20 2235   02/13/20 1900  piperacillin-tazobactam (ZOSYN) IVPB 3.375 g     3.375 g 100 mL/hr over 30 Minutes Intravenous STAT 02/13/20 1857 02/13/20 2301   02/12/20 1400  vancomycin (VANCOCIN) 50 mg/mL oral solution 125 mg     125 mg Oral 4 times daily 02/12/20 1152 02/22/20 1359   02/10/20 2000  vancomycin (VANCOREADY) IVPB 1750 mg/350 mL  Status:  Discontinued     1,750 mg 175 mL/hr over 120 Minutes Intravenous Every 24 hours 02/10/20 0355 02/12/20 1250   02/10/20 0800  metroNIDAZOLE (FLAGYL) IVPB 500 mg  Status:  Discontinued     500 mg 100 mL/hr over 60 Minutes Intravenous Every 8 hours 02/10/20 0118 02/12/20 1319   02/10/20 0600  ceFEPIme (MAXIPIME) 2 g in sodium chloride 0.9 % 100 mL IVPB  Status:  Discontinued     2 g 200 mL/hr over 30 Minutes Intravenous Every 8 hours 02/10/20 0355 02/12/20 1250   02/09/20 2200  vancomycin (VANCOREADY) IVPB 1500 mg/300 mL     1,500 mg 150 mL/hr over 120 Minutes Intravenous  Once 02/09/20 2128 02/10/20 0310   02/09/20 2130  ceFEPIme (MAXIPIME) 2 g in sodium chloride 0.9 % 100 mL IVPB     2 g 200 mL/hr over  30 Minutes Intravenous  Once 02/09/20 2120 02/10/20 0032   02/09/20 2130  metroNIDAZOLE (FLAGYL) IVPB 500 mg     500 mg 100 mL/hr over 60 Minutes Intravenous  Once 02/09/20 2120 02/10/20 0106   02/09/20 2130  vancomycin (VANCOCIN) IVPB 1000 mg/200 mL premix  Status:  Discontinued     1,000 mg 200 mL/hr over 60 Minutes Intravenous  Once 02/09/20 2120 02/09/20 2128     Subjective: Today, patient was seen and examined at bedside.  Feels little dyspneic fatigue and weak.  Family at bedside.  Denies overt pain nausea or vomiting   Objective: Vitals:   02/14/20 1200 02/14/20 1334  BP:    Pulse: (!) 120 (!) 120  Resp: (!) 23 (!) 27  Temp: 99.9 F (37.7 C) 99.5 F (37.5 C)  SpO2: 100% 100%    Intake/Output Summary (Last 24 hours) at 02/14/2020 1501 Last data filed at 02/14/2020 6387 Gross per 24 hour  Intake 2221.37 ml   Output 850 ml  Net 1371.37 ml   Filed Weights   02/12/20 0700  Weight: 72.1 kg   Body mass index is 24.9 kg/m.   Physical Exam: General:  Average built, not in obvious distress, alert awake and communicative. HENT: Normocephalic, pupils equally reacting to light and accommodation.  Pallor noted.  Chest:  Clear breath sounds.  Diminished breath sounds bilaterally. No crackles or wheezes.  CVS: S1 &S2 heard. No murmur.  Regular rate and rhythm. Abdomen: Soft, distended abdomen with epigastric tenderness and abdominal mass. Extremities: No cyanosis, clubbing trace bilateral lower extremity edema.  Peripheral pulses are palpable. Psych: Alert, awake and communicative, CNS:  No cranial nerve deficits.  Generalized weakness in all extremities Skin: Warm and dry.  No rashes noted.  Data Review: I have personally reviewed the following laboratory data and studies,  CBC: Recent Labs  Lab 02/11/20 0637 02/11/20 2023 02/12/20 0213 02/12/20 0213 02/13/20 0531 02/13/20 1615 02/14/20 0237 02/14/20 0500 02/14/20 1000  WBC 21.5*  --  20.0*  --  24.7* 33.8*  --  24.4*  --   HGB 6.1*   < > 8.0*   < > 7.6* 5.9* 7.8* 7.7* 7.5*  HCT 20.2*   < > 25.0*   < > 24.0* 19.8* 24.0* 23.4* 22.7*  MCV 93.1  --  91.9  --  92.7 96.6  --  90.3  --   PLT 157  --  167  --  124* 212  --  158  --    < > = values in this interval not displayed.   Basic Metabolic Panel: Recent Labs  Lab 02/11/20 0637 02/12/20 0213 02/13/20 0531 02/14/20 0023 02/14/20 0500  NA 135 136 134* 134* 136  K 4.2 4.5 4.3 5.3* 4.0  CL 107 110 106 110 111  CO2 11* 16* 16* 8* 14*  GLUCOSE 188* 169* 194* 170* 178*  BUN 32* 37* 32* 31* 39*  CREATININE 1.04 1.01 1.07 1.30* 1.38*  CALCIUM 7.7* 7.3* 7.1* 5.9* 6.5*  MG 1.6* 2.3 2.4  --  2.4  PHOS 2.4*  --  1.4*  --  3.6   Liver Function Tests: Recent Labs  Lab 02/09/20 1543 02/10/20 0237 02/11/20 0637 02/13/20 0531 02/14/20 0728  AST 31 31 34 156* 352*  ALT 23 23 22  83*  163*  ALKPHOS 99 89 66 63 62  BILITOT 1.3* 1.3* 1.4* 2.2* 3.5*  PROT 8.5* 7.5 6.7 7.1 6.0*  ALBUMIN 3.0* 2.6* 2.2* 2.6* 2.1*   Recent Labs  Lab 02/09/20 1543  LIPASE 21   No results for input(s): AMMONIA in the last 168 hours. Cardiac Enzymes: No results for input(s): CKTOTAL, CKMB, CKMBINDEX, TROPONINI in the last 168 hours. BNP (last 3 results) Recent Labs    05/10/19 0545 01/16/20 0405  BNP 40.1 81.9    ProBNP (last 3 results) No results for input(s): PROBNP in the last 8760 hours.  CBG: Recent Labs  Lab 02/13/20 1753  GLUCAP 151*   Recent Results (from the past 240 hour(s))  Urine culture     Status: None   Collection Time: 02/09/20  7:48 PM   Specimen: Urine, Clean Catch  Result Value Ref Range Status   Specimen Description   Final    URINE, CLEAN CATCH Performed at [facility], [place] [address], [place], [place] [address]    Special Requests   Final    NONE Performed at [facility], [place] [address] [place], [place] [address]    Culture   Final    NO GROWTH Performed at [facility], [place] [address] [place], [place] [address]    Report Status 02/11/2020 FINAL  Final  Culture, blood (routine x 2)     Status: None (Preliminary result)   Collection Time: 02/09/20  9:04 PM   Specimen: BLOOD  Result Value Ref Range Status   Specimen Description   Final    BLOOD BLOOD LEFT FOREARM Performed at [place] [address], [place], [place] [address]    Special Requests   Final    BOTTLES DRAWN AEROBIC AND ANAEROBIC Blood Culture results may not be optimal due to an excessive volume of blood received in culture bottles Performed at [place] [address] [place], [place] [address]    Culture   Final    NO GROWTH 4 DAYS Performed at [facility], [place] [address] [place], [place] [address]    Report Status PENDING  Incomplete  Culture, blood (routine x 2)      Status: None (Preliminary result)   Collection Time: 02/09/20  9:45 PM   Specimen: BLOOD LEFT FOREARM  Result Value Ref Range Status   Specimen Description   Final    BLOOD LEFT FOREARM Performed at [place] [address] [place], [place] [address]    Special Requests   Final    BOTTLES DRAWN AEROBIC AND ANAEROBIC Blood Culture results may not be optimal due to an excessive volume of blood received in culture bottles Performed at [place] [address] [place], [place] [address]    Culture   Final    NO GROWTH 4 DAYS Performed at [facility], [place] [address], [place], [place] [address]    Report Status PENDING  Incomplete  SARS CORONAVIRUS 2 (TAT 6-24 HRS) Nasopharyngeal Nasopharyngeal Swab     Status: None   Collection Time: 02/10/20 12:10 AM   Specimen: Nasopharyngeal Swab  Result Value Ref Range Status   SARS Coronavirus 2 NEGATIVE NEGATIVE Final    Comment: (NOTE) SARS-CoV-2 target nucleic acids are NOT DETECTED. The SARS-CoV-2 RNA is generally detectable in upper and lower respiratory specimens during the acute phase of infection. Negative results do not preclude SARS-CoV-2 infection, do not rule out co-infections with other pathogens, and should not be used as the sole basis for treatment or other patient management decisions. Negative results must be combined with clinical observations, patient history, and epidemiological  information. The expected result is Negative. Fact Sheet for Patients: SugarRoll.be Fact Sheet for Healthcare Providers: https://www.woods-mathews.com/ This test is not yet approved or cleared by the [place] FDA and  has been authorized for detection and/or diagnosis of SARS-CoV-2 by FDA under an Emergency Use Authorization (EUA). This EUA will remain  in effect (meaning this test can be used) for the duration of the COVID-19 declaration under  Section 56 4(b)(1) of the Act, 21 U.S.C. section 360bbb-3(b)(1), unless the authorization is terminated or revoked sooner. Performed at [facility], [place] [address] [place], [place] [address]   C Difficile Quick Screen w PCR reflex     Status: Abnormal   Collection Time: 02/11/20  3:00 PM   Specimen: STOOL  Result Value Ref Range Status   C Diff antigen POSITIVE (A) NEGATIVE Final   C Diff toxin NEGATIVE NEGATIVE Final   C Diff interpretation Results are indeterminate. See PCR results.  Final    Comment: Performed at [facility], [place] [address] [place], [place] [address]  Gastrointestinal Panel by PCR , Stool     Status: None   Collection Time: 02/11/20  3:00 PM   Specimen: STOOL  Result Value Ref Range Status   Campylobacter species NOT DETECTED NOT DETECTED Final   Plesimonas shigelloides NOT DETECTED NOT DETECTED Final   Salmonella species NOT DETECTED NOT DETECTED Final   Yersinia enterocolitica NOT DETECTED NOT DETECTED Final   Vibrio species NOT DETECTED NOT DETECTED Final   Vibrio cholerae NOT DETECTED NOT DETECTED Final   Enteroaggregative E coli (EAEC) NOT DETECTED NOT DETECTED Final   Enteropathogenic E coli (EPEC) NOT DETECTED NOT DETECTED Final   Enterotoxigenic E coli (ETEC) NOT DETECTED NOT DETECTED Final   Shiga like toxin producing E coli (STEC) NOT DETECTED NOT DETECTED Final   Shigella/Enteroinvasive E coli (EIEC) NOT DETECTED NOT DETECTED Final   Cryptosporidium NOT DETECTED NOT DETECTED Final   Cyclospora cayetanensis NOT DETECTED NOT DETECTED Final   Entamoeba histolytica NOT DETECTED NOT DETECTED Final   Giardia lamblia NOT DETECTED NOT DETECTED Final   Adenovirus F40/41 NOT DETECTED NOT DETECTED Final   Astrovirus NOT DETECTED NOT DETECTED Final   Norovirus GI/GII NOT DETECTED NOT DETECTED Final   Rotavirus A NOT DETECTED NOT DETECTED Final   Sapovirus (I, II, IV, and V) NOT DETECTED NOT DETECTED Final    Comment: Performed at  [facility], [place] [place], [place] [address]  C. Diff by PCR, Reflexed     Status: Abnormal   Collection Time: 02/11/20  3:00 PM  Result Value Ref Range Status   Toxigenic C. Difficile by PCR POSITIVE (A) NEGATIVE Final    Comment: Positive for toxigenic C. difficile with little to no toxin production. Only treat if clinical presentation suggests symptomatic illness. Performed at [facility], [place] [address] [place], [place] [address]   Culture, blood (Routine X 2) w Reflex to ID Panel     Status: None (Preliminary result)   Collection Time: 02/13/20  7:45 PM   Specimen: BLOOD RIGHT HAND  Result Value Ref Range Status   Specimen Description   Final    BLOOD RIGHT HAND Performed at [place] [address] [place],  [address]    Special Requests   Final    BOTTLES DRAWN AEROBIC ONLY Blood Culture results may not be optimal due to an inadequate volume of blood received in culture bottles Performed at [facility]  [place] [address], [place], [place] [address]    Culture   Final    NO GROWTH < 12 HOURS Performed at [place] [address] [place], [place] [address]    Report Status PENDING  Incomplete  Culture, blood (Routine X 2) w Reflex to ID Panel     Status: None (Preliminary result)   Collection Time: 02/13/20  7:45 PM   Specimen: BLOOD  Result Value Ref Range Status   Specimen Description   Final    BLOOD RIGHT WRIST Performed at [place] [address] [place], [place] [address]    Special Requests   Final    BOTTLES DRAWN AEROBIC ONLY Blood Culture adequate volume Performed at [place] [address], [place], [place] [address]    Culture   Final    NO GROWTH < 12 HOURS Performed at [place] [address], [place], [place] [address]    Report Status PENDING  Incomplete  MRSA PCR Screening     Status: None   Collection  Time: 02/14/20  9:43 AM   Specimen: Nasal Mucosa; Nasopharyngeal  Result Value Ref Range Status   MRSA by PCR NEGATIVE NEGATIVE Final    Comment:        The GeneXpert MRSA Assay (FDA approved for NASAL specimens only), is one component of a comprehensive MRSA colonization surveillance program. It is not intended to diagnose MRSA infection nor to guide or monitor treatment for MRSA infections. Performed at [facility], [place] [address] [place], [place] [address]      Studies: DG CHEST PORT 1 VIEW  Result Date: 02/13/2020 CLINICAL DATA:  Abdominal and back pain with likely spinal metastasis in the setting of metastatic neuroendocrine carcinoma EXAM: PORTABLE CHEST 1 VIEW COMPARISON:  Radiograph 02/09/2020 FINDINGS: Low lung volumes with basilar atelectatic changes. Lungs are otherwise clear. Cardiomediastinal contours are stable. Right IJ approach Port-A-Cath tip terminates at the superior cavoatrial junction. Telemetry leads overlie the chest. No acute osseous or soft tissue abnormality. IMPRESSION: Low lung volumes with basilar atelectatic changes. No consolidation. Electronically Signed   By: Lovena Le M.D.   On: 02/13/2020 16:37   CT Angio Abd/Pel w/ and/or w/o  Result Date: 02/13/2020 CLINICAL DATA:  Metastatic neuroendocrine carcinoma, diffuse liver metastases, spinal metastases, gastrointestinal bleeding EXAM: CTA ABDOMEN AND PELVIS WITHOUT AND WITH CONTRAST TECHNIQUE: Multidetector CT imaging of the abdomen and pelvis was performed using the standard protocol during bolus administration of intravenous contrast. Multiplanar reconstructed images and MIPs were obtained and reviewed to evaluate the vascular anatomy. CONTRAST:  147mL OMNIPAQUE IOHEXOL 350 MG/ML SOLN COMPARISON:  02/09/2020, 01/15/2020 FINDINGS: VASCULAR Aorta: Normal caliber aorta without aneurysm, dissection, vasculitis or significant stenosis. Celiac: Patent without evidence of aneurysm, dissection,  vasculitis or significant stenosis. SMA: Patent without evidence of aneurysm, dissection, vasculitis or significant stenosis. Renals: Both renal arteries are patent without evidence of aneurysm, dissection, vasculitis, fibromuscular dysplasia or significant stenosis. IMA: Patent without evidence of aneurysm, dissection, vasculitis or significant stenosis. Inflow: Patent without evidence of aneurysm, dissection, vasculitis or significant stenosis. Proximal Outflow: Bilateral common femoral and visualized portions of the superficial and profunda femoral arteries are patent without evidence of aneurysm, dissection, vasculitis or significant stenosis. Veins: No obvious venous abnormality within the limitations of this arterial phase study. Review of the MIP images confirms the above findings. NON-VASCULAR Lower chest: No acute pleural or parenchymal lung disease. Hepatobiliary: Diffuse hepatic metastases  are again identified, unchanged. Gallbladder is unremarkable. Pancreas: Hypodense mass at the junction of the body and tail the pancreas may reflect primary malignancy or metastatic disease. This is stable. Spleen: Normal in size without focal abnormality. Adrenals/Urinary Tract: Adrenal glands are unremarkable. Kidneys are normal, without renal calculi, focal lesion, or hydronephrosis. Bladder is unremarkable. Foley catheter decompresses the bladder. Stomach/Bowel: Stomach is markedly distended. There is no bowel obstruction or ileus. Abnormal wall thickening is seen within the mid to distal jejunum within the lower abdomen and pelvis, which could reflect inflammation, infection, or ischemia. No evidence of pneumatosis. High attenuation of bowel contents within the stomach and proximal colon limit evaluation for active gastrointestinal hemorrhage. There is evidence of contrast accumulation within the distal rectum just proximal to the anal verge, only seen on delayed imaging, which may reflect either mucosal hyperemia  or active bleeding at the anal verge. No other signs of active gastrointestinal bleeding. Lymphatic: No pathologic adenopathy. Reproductive: Prostate is unremarkable. Other: No free fluid or free gas within the abdomen or pelvis. Musculoskeletal: Diffuse bony metastases involving the thoracolumbar spine are unchanged. No pathologic fracture. IMPRESSION: VASCULAR 1. Accumulation of contrast within the distal rectum at the anal verge. This could reflect bleeding internal hemorrhoids. Please correlate with physical exam and direct visual inspection. 2. Otherwise unremarkable appearance of the abdominal and pelvic vasculature. NON-VASCULAR 1. Wall thickening of the mid to distal jejunum within the lower abdomen and pelvis, which may be inflammatory, infectious, or ischemic. 2. Stable diffuse liver metastases. 3. Stable hypodense mass within the pancreatic tail, concerning for primary malignancy. 4. Stable bony metastases. Electronically Signed   By: Randa Ngo M.D.   On: 02/13/2020 21:21     Flora Lipps, MD  [facility] 02/14/2020

## 2020-02-14 NOTE — Progress Notes (Signed)
Long discussion at bedside w/ the patient, his wife AND brother. We had been discussing possible EGD earlier today. He has been relatively stable and from a critical care stand-point is probably as stable as can be expected for proceeding w/ endoscopic procedure. We had spoke w/ GI team who were planning on EGD this afternoon. We were discussing logistics of this with the patient. Daire was very clear that he did NOT want to proceed w/ further invasive diagnostic evaluation. His largest concern being that if something such as uncontrolled bleeding were to occur during the procedure and if he were sedated and or intubated may not have an opportunity to have those final moments with his family. His wife was at bedside for this discussion and his brother present via phone. I did share with him that this may be our only window of opportunity to evaluate this safely and that if significant bleeding were to again occur we would not be in a safe position to consider EGD again. He was very clear that he understands this risk and stated that he could die either way and wanted to do it with his family present and not sedated or potentially on life support.  His family was comfortable with this decision  We shared his decision w/ the GI team  Plan Cont supportive care Cont PPI Treat CDiff No EGD  Full DNR If her were to decline significantly we would transition to comfort.   Erick Colace ACNP-BC [place] Pager # [phone] OR # [phone] if no answer

## 2020-02-14 NOTE — Progress Notes (Signed)
[facility] Progress Note  CC:  GI bleed/melena  Subjective:  He denies having any N/V or abdominal pain. He appears tachypneic. Denies chest pain or SOB. Wife at bed side.   He developed tachypnea and tachycardia yesterday at 5pm. Hg 7.6 -> 5.9. He received 1L IV fluid bolus. Ativan 13m IV. He was transferred to the ICU. He received 2 units of PRBCs. Post transfusion Hg 7.8.  4/13 INR 3.0. Fresh frozen plasma ordered, administration delayed due to fever 100.47F. Lactic acid > 11. Abd/pelvic CT angiogram showed wall thickening to the mid to distal jejunum within the lower abdomen and pelvis, stable liver metastasis, accumulation of contrast in the distal rectum and anal verge otherwise unremarkable appearance of the abd/pelvis vasculature. He continues to have brown watery diarrhea. On vanco po for c.diff. Blood cultures repeated. Chest xray without acute changes.    Objective:   Abdominal/Pelvic CT angiogram 02/13/2020:  VASCULAR 1. Accumulation of contrast within the distal rectum at the anal verge. This could reflect bleeding internal hemorrhoids. Please correlate with physical exam and direct visual inspection. 2. Otherwise unremarkable appearance of the abdominal and pelvic vasculature.  NON-VASCULAR  1. Wall thickening of the mid to distal jejunum within the lower abdomen and pelvis, which may be inflammatory, infectious, or ischemic. 2. Stable diffuse liver metastases. 3. Stable hypodense mass within the pancreatic tail, concerning for primary malignancy. 4. Stable bony metastases  EGD 02/12/2020: - Esophagogastric landmarks identified. - Suspect ulceration at GEJ vs. variant of blanched mucosa as outlined above. - Normal esophagus otherwise - Clotted blood in the gastric fundus and in the gastric body. Significant time spent to clear the stomach - Extrinsic compression vs. subepithelial lesion in the fundus with friable tissue in the fundus in general  but no high risk stigmata or bleeding visualized - Normal stomach otherwise - Normal duodenal bulb and second portion of the duodenum.  Abdominal/pelvic CT with contrast 02/09/2020: 1. Marked severity hepatomegaly with numerous heterogeneous low-attenuation liver lesions of various sizes, consistent with hepatic metastasis. 2. Findings consistent with osseous metastasis involving the T12, L2, L4 vertebral bodies and left iliac bone. 3. Low-attenuation soft tissue masses within the pancreas which may represent a primary malignancy. Aortic Atherosclerosis   Vital signs in last 24 hours: Temp:  [97 F (36.1 C)-100.9 F (38.3 C)] 100.2 F (37.9 C) (04/13 0720) Pulse Rate:  [81-133] 81 (04/13 0720) Resp:  [18-51] 19 (04/13 0720) BP: (127-176)/(55-111) 169/99 (04/13 0720) SpO2:  [98 %-100 %] 98 % (04/13 0720) Last BM Date: 02/13/20 General:   Ill appearing male, tachypneic.  Heart: Tachycardic, no murmur.  Pulm:  Breath sounds clear, diminished throughout.  Abdomen: RUQ grossly distended, firm, hepatomegaly with live masses. Non tender. ? ascitse. Hypoactive bowel sounds x 4 quads.  Rectal: Small round thrombosed right external hemorrhoid, internal hemorrhoids palpated without obvious rectal bleeding, smear of loose golden brown stool was grossly guaiac positive. RN present at time of exam.  Extremities:  LEs with trace edema.  Neurologic:  Alert and  oriented x4;  grossly normal neurologically. Psych:  Alert and cooperative. Normal mood and affect.  Intake/Output from previous day: 04/12 0701 - 04/13 0700 In: 3697.8 [P.O.:637; I.V.:1276.1; Blood:555; IV Piggyback:1229.7] Out: 850 [Urine:850] Intake/Output this shift: No intake/output data recorded.  Lab Results: Recent Labs    02/12/20 0213 02/12/20 0213 02/13/20 0531 02/13/20 1615 02/14/20 0237  WBC 20.0*  --  24.7* 33.8*  --   HGB 8.0*   < >  7.6* 5.9* 7.8*  HCT 25.0*   < > 24.0* 19.8* 24.0*  PLT 167  --  124* 212  --     < > = values in this interval not displayed.   BMET Recent Labs    02/12/20 0213 02/13/20 0531 02/14/20 0023  NA 136 134* 134*  K 4.5 4.3 5.3*  CL 110 106 110  CO2 16* 16* 8*  GLUCOSE 169* 194* 170*  BUN 37* 32* 31*  CREATININE 1.01 1.07 1.30*  CALCIUM 7.3* 7.1* 5.9*   LFT Recent Labs    02/13/20 0531  PROT 7.1  ALBUMIN 2.6*  AST 156*  ALT 83*  ALKPHOS 63  BILITOT 2.2*   PT/INR Recent Labs    02/14/20 0011  LABPROT 30.9*  INR 3.0*   Hepatitis Panel No results for input(s): HEPBSAG, HCVAB, HEPAIGM, HEPBIGM in the last 72 hours.  DG CHEST PORT 1 VIEW  Result Date: 02/13/2020 CLINICAL DATA:  Abdominal and back pain with likely spinal metastasis in the setting of metastatic neuroendocrine carcinoma EXAM: PORTABLE CHEST 1 VIEW COMPARISON:  Radiograph 02/09/2020 FINDINGS: Low lung volumes with basilar atelectatic changes. Lungs are otherwise clear. Cardiomediastinal contours are stable. Right IJ approach Port-A-Cath tip terminates at the superior cavoatrial junction. Telemetry leads overlie the chest. No acute osseous or soft tissue abnormality. IMPRESSION: Low lung volumes with basilar atelectatic changes. No consolidation. Electronically Signed   By: Lovena Le M.D.   On: 02/13/2020 16:37   CT Angio Abd/Pel w/ and/or w/o  Result Date: 02/13/2020 CLINICAL DATA:  Metastatic neuroendocrine carcinoma, diffuse liver metastases, spinal metastases, gastrointestinal bleeding EXAM: CTA ABDOMEN AND PELVIS WITHOUT AND WITH CONTRAST TECHNIQUE: Multidetector CT imaging of the abdomen and pelvis was performed using the standard protocol during bolus administration of intravenous contrast. Multiplanar reconstructed images and MIPs were obtained and reviewed to evaluate the vascular anatomy. CONTRAST:  168m OMNIPAQUE IOHEXOL 350 MG/ML SOLN COMPARISON:  02/09/2020, 01/15/2020 FINDINGS: VASCULAR Aorta: Normal caliber aorta without aneurysm, dissection, vasculitis or significant stenosis.  Celiac: Patent without evidence of aneurysm, dissection, vasculitis or significant stenosis. SMA: Patent without evidence of aneurysm, dissection, vasculitis or significant stenosis. Renals: Both renal arteries are patent without evidence of aneurysm, dissection, vasculitis, fibromuscular dysplasia or significant stenosis. IMA: Patent without evidence of aneurysm, dissection, vasculitis or significant stenosis. Inflow: Patent without evidence of aneurysm, dissection, vasculitis or significant stenosis. Proximal Outflow: Bilateral common femoral and visualized portions of the superficial and profunda femoral arteries are patent without evidence of aneurysm, dissection, vasculitis or significant stenosis. Veins: No obvious venous abnormality within the limitations of this arterial phase study. Review of the MIP images confirms the above findings. NON-VASCULAR Lower chest: No acute pleural or parenchymal lung disease. Hepatobiliary: Diffuse hepatic metastases are again identified, unchanged. Gallbladder is unremarkable. Pancreas: Hypodense mass at the junction of the body and tail the pancreas may reflect primary malignancy or metastatic disease. This is stable. Spleen: Normal in size without focal abnormality. Adrenals/Urinary Tract: Adrenal glands are unremarkable. Kidneys are normal, without renal calculi, focal lesion, or hydronephrosis. Bladder is unremarkable. Foley catheter decompresses the bladder. Stomach/Bowel: Stomach is markedly distended. There is no bowel obstruction or ileus. Abnormal wall thickening is seen within the mid to distal jejunum within the lower abdomen and pelvis, which could reflect inflammation, infection, or ischemia. No evidence of pneumatosis. High attenuation of bowel contents within the stomach and proximal colon limit evaluation for active gastrointestinal hemorrhage. There is evidence of contrast accumulation within the distal rectum just proximal to  the anal verge, only seen on  delayed imaging, which may reflect either mucosal hyperemia or active bleeding at the anal verge. No other signs of active gastrointestinal bleeding. Lymphatic: No pathologic adenopathy. Reproductive: Prostate is unremarkable. Other: No free fluid or free gas within the abdomen or pelvis. Musculoskeletal: Diffuse bony metastases involving the thoracolumbar spine are unchanged. No pathologic fracture. IMPRESSION: VASCULAR 1. Accumulation of contrast within the distal rectum at the anal verge. This could reflect bleeding internal hemorrhoids. Please correlate with physical exam and direct visual inspection. 2. Otherwise unremarkable appearance of the abdominal and pelvic vasculature. NON-VASCULAR 1. Wall thickening of the mid to distal jejunum within the lower abdomen and pelvis, which may be inflammatory, infectious, or ischemic. 2. Stable diffuse liver metastases. 3. Stable hypodense mass within the pancreatic tail, concerning for primary malignancy. 4. Stable bony metastases. Electronically Signed   By: Randa Ngo M.D.   On: 02/13/2020 21:21    Assessment / Plan:  63. 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 Admission Hg 6.4. He received 3 units of PRBCs 4/10-4/11.  Hg 7.7 ->8.0 -> 7.6. S/P EGD 4/11 identified a suspected ulcer at the GEJ vs blanching effect from compression, a significant burden of old clotted blood was found in the gastric fundus and gastric body. Source of bleeding was unclear. Plan was for repeat EGD.  He was transferred to the ICU 4/12 after he became tachypneic and tachycardic. Temp 100.48F. Hg dropped 7.6 to 5.9. He received 2 units of PRBCs. Post transfusion Hg 7.8. INR 3.0. FFP ordered. Abd/pelvic CT angiogram showed wall thickening to the mid to distal jejunum, accumulation of contrast in the rectum, stable liver mets and unremarkable appearance  of the abd/pelvic vasculature. He continues to have watery brown diarrhea. Rectal exam today showed a thrombosed external hemorrhoid and brown loose stool grossly heme +.  Elevated LFTs. Labs 4/12 AST 156. ALT 83. Alk phos 63. T. Bili 2.2.  -Repeat H/H at 10am -Check H/H Q 6 hours -Consider EGD in the ICU with PCCM management of airway, await repeat H/H result -Hepatic panel added to am labs  -NPO -Pantoprazole 23m IV bid  2. Sepsis, unclear etiology. WBC 33.8. Lactic Acid 10.5.  On IV Vanco and Zosyn.  3. C. Diff PCR + treated with po vanco. May consider increasing po Vanco to [id number].   4. Coagulopathy secondary to sepsis, liver metastasis. INR 3.0. FFP ordered.   Further recommendations per Dr. 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

## 2020-02-14 NOTE — Progress Notes (Signed)
NAME:  [name], MRN:  [id number], DOB:  09/03/1961, LOS: 5 ADMISSION DATE:  02/09/2020, CONSULTATION DATE:  02/13/2020 REFERRING MD:  Herbert Handing MD, CHIEF COMPLAINT: Tachycardia, tachypnea, lactic acidosis  Brief History   59 year old with hypertension, gout, OSA, metastatic neuroendocrine carcinoma with liver mets presenting with abdominal, back pain, found to have mets to the spine. Initially admitted with sepsis secondary to C. difficile, GI bleed. Underwent EGD on 4/11 with findings of significant blood in the stomach but no obvious bleed noted.  Rapid response called on 4/12 for tachypnea, tachycardia, lactic acid greater than 11 and hemoglobin of 5.9.  Transfered to ICU and PCCM consulted  Past Medical History   Hypertension, OSA, gout, relapsed metastatic neuroendocrine carcinoma Cancer was diagnosed in December 2019.  Follows with Dr. Irene Hernandez  [facility] Events   4/8 Admit 4/11 EGD 4/12 Transfer to ICU after rapid response.  4/13: CT abdomen angiogram showed for clipping intubated distal jejunum within the lower abdominal pelvis possibly inflammatory or infectious versus ischemic in nature.  Stable diffuse granular casts.  Stable hypodense mass pancreatic tail.  Stable bone metastasis.   Hyperkalemic overnight febrile, seen by GI medicine in the morning.  Continues to have brown watery diarrhea, hemoglobin down as low as 5.9, he has since received 2 more units of blood post transfusion up to 7.8 GI medicine deciding on repeat endoscopy Consults:  PCCM, GI  Procedures:  EGD 4/12- A significant burden of old clotted blood was found in the gastric fundus and in the gastric body.   Significant Diagnostic Tests:  CT abdomen pelvis 02/09/2020-severe hepatomegaly with hepatic mets, osseous mets involving T12, L2, L4, soft tissue mass in pancreas. Over 2 hours were spent tediously removing clot with a variety of techniques until complete clearance was achieved. There was no  fresh /red blood anywhere in the stomach. no active bleeding 4/12 CT abdomen angiogram showed for clipping intubated distal jejunum within the lower abdominal pelvis possibly inflammatory or infectious versus ischemic in nature.  Stable diffuse granular casts.  Stable hypodense mass pancreatic tail.  Stable bone metastasis.     Micro Data:  C. difficile 4/10-positive Urine cultures 4/8-no growth Blood cultures 4/8-no growth  Antimicrobials:  Cefepime 4/8-4/11 Flagyl 4/8-4/11  Zosyn 4/12 >> P.o. Vanco 4/11 >> IV Vanco 4/12 >>  Interim history/subjective:  Feels better  Objective   Blood pressure (Abnormal) 169/99, pulse (Abnormal) 116, temperature 100 F (37.8 C), resp. rate (Abnormal) 25, height 5\' 7"  (1.702 m), weight 72.1 kg, SpO2 100 %.        Intake/Output Summary (Last 24 hours) at 02/14/2020 0944 Last data filed at 02/14/2020 4696 Gross per 24 hour  Intake 3460.76 ml  Output 850 ml  Net 2610.76 ml   Filed Weights   02/12/20 0700  Weight: 72.1 kg    Examination: General: Pleasant 59 year old black male he is currently resting in bed and in no acute distress HEENT normocephalic atraumatic does have temporal wasting.  Mucous membranes are moist but pale Pulmonary: Clear to auscultation diminished bases Cardiac: Regular rate and rhythm Abdomen: Distended, hypoactive, still having liquid stool.  Pain less pronounced Extremities: Dependent edema brisk cap refill Neuro: Awake oriented no focal deficits.  Resolved Hospital Problem list     Assessment & Plan:  Severe lactic acidosis;  Likely secondary to hemorrhagic shock from GI bleeding versus intra-abdominal ischemia given CT angiogram findings.  Suspect this will be slow to clear given hepatic metastasis Plan Follow-up lactic acid Treat hypotension  Acute blood loss anemia.  EGD on 4/11 showed large clot fungus however actual source of bleeding remains unclear -Received 3 units initially admission, received yet  another 2 units overnight , Hemoglobin had dropped from 7.6 down to 5.9, currently at 7.7 Plan Continue serial CBCs Continue PPI N.p.o. GI medicine to decide on potentially repeat bronchoscopy  Coagulopathy.  Suspect exacerbated by liver metastasis Plan Receiving FFP Follow-up INR  Mild AKI, with hyperkalemia -Received Kayexalate Plan Follow-up blood chemistry Try to avoid hypotension  Severe sepsis in setting of C. difficile infection, cannot exclude additional source Plan Continue oral vancomycin  Day #2 vancomycin IV and Zosyn Await cultures   Goals of care  Full DO NOT RESUSCITATE, however would intubate if needs endoscopy Best practice:  Diet: NPO Pain/Anxiety/Delirium protocol (if indicated): PRN Dilaudid VAP protocol (if indicated):  NA DVT prophylaxis: No heparin due to GI bleed.  Place SCDs GI prophylaxis: PPI Glucose control: Monitor Mobility: Bed Code Status: Full Family Communication: Patient updated Disposition: ICU   Critical care time:  33 minutes.   [name] ACNP-BC [place] Pager # [phone] OR # [phone] if no answer

## 2020-02-14 NOTE — Progress Notes (Signed)
[place] Progress Note Patient Name: [name] DOB: [birth year] MRN: [id number]   Date of Service  02/14/2020  HPI/Events of Note  Review of Abdomen/Pelvis CTA 1. Accumulation of contrast within the distal rectum at the anal verge. This could reflect bleeding internal hemorrhoids. Please correlate with physical exam and direct visual inspection. 2. Otherwise unremarkable appearance of the abdominal and pelvic vasculature.  NON-VASCULAR  1. Wall thickening of the mid to distal jejunum within the lower abdomen and pelvis, which may be inflammatory, infectious, or ischemic. 2. Stable diffuse liver metastases. 3. Stable hypodense mass within the pancreatic tail, concerning for primary malignancy. 4. Stable bony metastases  eICU Interventions  Will order: 1. PT/INR now.  2. PTT now.     Intervention Category Intermediate Interventions: Diagnostic test evaluation  Aretha Levi Eugene 02/14/2020, 12:05 AM

## 2020-02-14 NOTE — Progress Notes (Signed)
Please allow pt children to visit. [place]  Thanks, Barnabas Lister, RN

## 2020-02-14 NOTE — Progress Notes (Signed)
[facility] Progress Note Patient Name: [name] DOB: 1960-12-15 MRN: [id number]   Date of Service  02/14/2020  HPI/Events of Note  Fever to 100.6 F. AST and ALT both elevated, therefore, can't use Tylenol. Creatinine = 1.06. Already on Zosyn and Vancomycin PO/IV  eICU Interventions  Will order: 1. Motrin liquid 200 mg PO X 1 now. 2. Ice packs PRN.      Intervention Category Major Interventions: Other:  Lysle Dingwall 02/14/2020, 4:32 AM

## 2020-02-14 NOTE — Progress Notes (Signed)
Pharmacy Antibiotic Note  [name] is a 59 y.o. male admitted on 02/09/2020 with sepsis. Currently being treated with PO Vancomycin for possible C.diff infection. Rapid response called today due to tachypnea and tachycardia. Resuming IV antibiotics per MD. Pharmacy has been consulted for Vancomycin and Zosyn dosing.  02/14/20 SCr bumped  Plan: Continue Zosyn as ordered Change vancomycin to 1250 mg iv q 24 hours starting 6 hours after last dose Expected AUC 490, SCr used 1.38 TRH wants to continue vancomycin despite negative MRSA PCR  F/U renal function and culture results   Height: 5\' 7"  (170.2 cm) Weight: 72.1 kg (159 lb) IBW/kg (Calculated) : 66.1  Temp (24hrs), Avg:99 F (37.2 C), Min:97 F (36.1 C), Max:100.9 F (38.3 C)  Recent Labs  Lab 02/11/20 0637 02/11/20 2356 02/12/20 0213 02/13/20 0531 02/13/20 1615 02/13/20 2130 02/14/20 0023 02/14/20 0500 02/14/20 0830  WBC 21.5*  --  20.0* 24.7* 33.8*  --   --  24.4*  --   CREATININE 1.04  --  1.01 1.07  --   --  1.30* 1.38*  --   LATICACIDVEN  --    < > 3.4* 3.7* >11.0* 10.5*  --   --  3.3*   < > = values in this interval not displayed.    Estimated Creatinine Clearance: 53.9 mL/min (A) (by C-G formula based on SCr of 1.38 mg/dL (H)).    Allergies  Allergen Reactions  . Remdesivir Other (See Comments)    Chest tightness  . Chlorhexidine     PORT not accessed    Antimicrobials this admission: 4/8 Cefepime >> 4/11 4/9 Vancomycin (IV) >> 4/11, resumed 4/12 >> 4/9 Flagyl >> 4/11 4/11 Vancomycin PO >> (4/21) 4/12 Zosyn >>  Microbiology results: 4/8 BCx: NGTD 4/8 UCx: NGF 4/9 COVID: negative  4/10 C.diff quick screen: antigen positive, toxin negative 4/10 C.diff by PCR, reflexed: positive  4/10 GI panel: negative 4/12 BCx: ngtd 4/12 UCx: sent 4/12 MRSA PCR: neg   Thank you for allowing pharmacy to be a part of this patient's care.   Ulice Dash, PharmD, BCPS 02/14/2020 1:34 PM

## 2020-02-15 DIAGNOSIS — Z7189 Other specified counseling: Secondary | ICD-10-CM

## 2020-02-15 DIAGNOSIS — Z515 Encounter for palliative care: Secondary | ICD-10-CM

## 2020-02-15 DIAGNOSIS — R531 Weakness: Secondary | ICD-10-CM

## 2020-02-15 LAB — COMPREHENSIVE METABOLIC PANEL
ALT: 129 U/L — ABNORMAL HIGH (ref 0–44)
AST: 191 U/L — ABNORMAL HIGH (ref 15–41)
Albumin: 2.1 g/dL — ABNORMAL LOW (ref 3.5–5.0)
Alkaline Phosphatase: 66 U/L (ref 38–126)
Anion gap: 11 (ref 5–15)
BUN: 51 mg/dL — ABNORMAL HIGH (ref 6–20)
CO2: 14 mmol/L — ABNORMAL LOW (ref 22–32)
Calcium: 6.3 mg/dL — CL (ref 8.9–10.3)
Chloride: 109 mmol/L (ref 98–111)
Creatinine, Ser: 1.71 mg/dL — ABNORMAL HIGH (ref 0.61–1.24)
GFR calc Af Amer: 50 mL/min — ABNORMAL LOW (ref >60)
GFR calc non Af Amer: 43 mL/min — ABNORMAL LOW (ref >60)
Glucose, Bld: 193 mg/dL — ABNORMAL HIGH (ref 70–99)
Potassium: 3.5 mmol/L (ref 3.5–5.1)
Sodium: 134 mmol/L — ABNORMAL LOW (ref 135–145)
Total Bilirubin: 3.7 mg/dL — ABNORMAL HIGH (ref 0.3–1.2)
Total Protein: 6.2 g/dL — ABNORMAL LOW (ref 6.5–8.1)

## 2020-02-15 LAB — CBC
HCT: 20 % — ABNORMAL LOW (ref 39.0–52.0)
HCT: 25 % — ABNORMAL LOW (ref 39.0–52.0)
Hemoglobin: 6.6 g/dL — CL (ref 13.0–17.0)
Hemoglobin: 8.2 g/dL — ABNORMAL LOW (ref 13.0–17.0)
MCH: 30.4 pg (ref 26.0–34.0)
MCH: 29.9 pg (ref 26.0–34.0)
MCHC: 33 g/dL (ref 30.0–36.0)
MCHC: 32.8 g/dL (ref 30.0–36.0)
MCV: 92.2 fL (ref 80.0–100.0)
MCV: 91.2 fL (ref 80.0–100.0)
Platelets: 155 10*3/uL (ref 150–400)
Platelets: 165 10*3/uL (ref 150–400)
RBC: 2.17 MIL/uL — ABNORMAL LOW (ref 4.22–5.81)
RBC: 2.74 MIL/uL — ABNORMAL LOW (ref 4.22–5.81)
RDW: 19 % — ABNORMAL HIGH (ref 11.5–15.5)
RDW: 18.6 % — ABNORMAL HIGH (ref 11.5–15.5)
WBC: 24.2 10*3/uL — ABNORMAL HIGH (ref 4.0–10.5)
WBC: 24.1 10*3/uL — ABNORMAL HIGH (ref 4.0–10.5)
nRBC: 0.3 % — ABNORMAL HIGH (ref 0.0–0.2)
nRBC: 0.2 % (ref 0.0–0.2)

## 2020-02-15 LAB — BPAM FFP
Blood Product Expiration Date: 202104182359
Blood Product Expiration Date: 202104182359
ISSUE DATE / TIME: 202104131559
ISSUE DATE / TIME: 202104131756
Unit Type and Rh: 6200
Unit Type and Rh: 6200

## 2020-02-15 LAB — PREPARE FRESH FROZEN PLASMA
Unit division: 0
Unit division: 0

## 2020-02-15 LAB — URINE CULTURE: Culture: NO GROWTH

## 2020-02-15 LAB — CULTURE, BLOOD (ROUTINE X 2)
Culture: NO GROWTH
Culture: NO GROWTH

## 2020-02-15 LAB — PHOSPHORUS: Phosphorus: 3 mg/dL (ref 2.5–4.6)

## 2020-02-15 LAB — MAGNESIUM: Magnesium: 2.4 mg/dL (ref 1.7–2.4)

## 2020-02-15 LAB — PREPARE RBC (CROSSMATCH)

## 2020-02-15 LAB — LACTIC ACID, PLASMA: Lactic Acid, Venous: 2.9 mmol/L (ref 0.5–1.9)

## 2020-02-15 MED ORDER — CALCIUM GLUCONATE-NACL 2-0.675 GM/100ML-% IV SOLN
2.0000 g | Freq: Once | INTRAVENOUS | Status: AC
  Administered 2020-02-15: 2000 mg via INTRAVENOUS
  Filled 2020-02-15: qty 100

## 2020-02-15 MED ORDER — METOPROLOL TARTRATE 25 MG PO TABS
25.0000 mg | ORAL_TABLET | Freq: Two times a day (BID) | ORAL | Status: DC
  Administered 2020-02-15 – 2020-02-16 (×2): 25 mg via ORAL
  Filled 2020-02-15 (×2): qty 1

## 2020-02-15 MED ORDER — VANCOMYCIN VARIABLE DOSE PER UNSTABLE RENAL FUNCTION (PHARMACIST DOSING): Status: DC

## 2020-02-15 MED ORDER — IBUPROFEN 100 MG/5ML PO SUSP
400.0000 mg | Freq: Once | ORAL | Status: AC
  Administered 2020-02-15: 400 mg via ORAL
  Filled 2020-02-15 (×2): qty 20

## 2020-02-15 MED ORDER — SODIUM CHLORIDE 0.9% IV SOLUTION: Freq: Once | INTRAVENOUS | Status: AC

## 2020-02-15 MED ORDER — ORAL CARE MOUTH RINSE: 15.0000 mL | Freq: Two times a day (BID) | OROMUCOSAL | Status: DC

## 2020-02-15 NOTE — Consult Note (Signed)
Consultation Note Date: 02/15/2020   Patient Name: Cathy Ropp  DOB: 21-Feb-1961  MRN: [id number]  Age / Sex: 59 y.o., male  PCP: Patient, No Pcp Per Referring Physician: Flora Lipps, MD  Reason for Consultation: Establishing goals of care  HPI/Patient Profile: 59 y.o. male admitted on 02/09/2020  Mr Pickrell lives at home with wife and 52 year old daughter, he has 2 sons, his brother is also currently in town from [place] to additional support. He is a 59 y.o.malewith PMH of HTN, gout, OSA andmetastatic neuroendocrine carcinoma with metastatic disease to liverwho presented to the ER with abdominal and back pain and found to have likely mets to his spine.Patient reports he was diagnosed with liver cancer in Nov 2019. He has been following with Dr. Irene Limbo since that time and completed chemotherapy.  Clinical Assessment and Goals of Care: Patient remains admitted to hospital medicine service in stepdown unit at [facility] in [place] with severe sepsis, diarrhea possible C diff infection, in the setting of metastatic neuroendocrine cancer, metastasis to liver and bone.   A palliative medicine consult has been requested for ongoing goals of care discussions.   Mr Lax is resting in bed, his wife and his brother are at the bedside. He appeared to be resting. Initial conversation took place with wife and brother and then I returned to his bedside for discussions with him.   I introduced myself and palliative care as follows: Palliative medicine is specialized medical care for people living with serious illness. It focuses on providing relief from the symptoms and stress of a serious illness. The goal is to improve quality of life for both the patient and the family.  Goals of care: Broad aims of medical therapy in relation to the patient's values and preferences. Our aim is to provide  medical care aimed at enabling patients to achieve the goals that matter most to them, given the circumstances of their particular medical situation and their constraints.   We reviewed about the patient's current condition, his goals, wishes and values attempted to be explored. Wife and brother state that the patient's 2 sons and his 77 year old daughter will be in to visit with him soon. The patient talks about "not giving up" and doesn't want to disappoint his family. He wants to be there for his daughter. He wants to "hold on" and believes that he will go home towards the end of this hospitalization.   We reviewed about the serious incurable nature of the patient's aggressive malignancy. Additionally, we talked about sepsis physiology, possible C diff infection and various electrolyte abnormalities at play.   We talked about treating the reversible conditions such as electrolyte disturbances and infection, how ever, we spent a lot of time reviewing about shifting our focus to comfort measures, aggressive symptom management and residential hospice towards the end of this hospitalization. Hospice philosophy of care explained to patient and his family in detail. Residential hospice also explained. All of their questions addressed to the best of my  ability.   NEXT OF KIN  wife, 2 sons, 1 daughter and brother.   SUMMARY OF RECOMMENDATIONS    Agree with DNR Continue current mode of care Additional family to visit today Ongoing discussions with patient and family regarding residential hospice and comfort care, patient awake/alert/oriented and at this point, wishes to continue with current mode of care, he does understand the serious incurable nature of his overall condition and will continue hospice discussions with his family.  Thank you for the consult.  Code Status/Advance Care Planning:  DNR    Symptom Management:    as above.   Palliative Prophylaxis:   Delirium Protocol     Psycho-social/Spiritual:   Desire for further Chaplaincy support:yes  Additional Recommendations: Education on Hospice  Prognosis:   Unable to determine Likely 2-3 weeks at this point in time, in my opinion. Discussed this frankly but compassionately with wife and brother as well as with patient.   Discharge Planning: To Be Determined Ongoing discussions with patient and family about residential hospice.     Primary Diagnoses: Present on Admission: . Sepsis ([place]) . Gout . Bone metastases ([place]) . 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 Fever   I have reviewed the medical record, interviewed the patient and family, and examined the patient. The following aspects are pertinent.  Past Medical History:  Diagnosis Date  . Arthritis    "knees; left hand"  . Cancer ([place])   . Complication of anesthesia    pt stated he "didn't feel well" after anesthesia the night of procdure  . Gout   . Heart murmur    Longstanding. 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 Hypertension   . Near syncope    05/2011 with tachypalpitations & with echo showing mild LVH, normal EF 55-60%, trivial AR   Social History   Socioeconomic History  . Marital status: Married    Spouse name: Not on file  . Number of children: 1  . Years of education: Not on file  . Highest education level: Not on file  Occupational History  . Occupation: Unemployed  Tobacco Use  . Smoking status: Never Smoker  . Smokeless tobacco: Never Used  Substance and Sexual Activity  . Alcohol use: Not Currently    Comment: states he has cut back/ every now and again  . Drug use: No  . Sexual activity: Yes    Partners: Female  Other Topics Concern  . Not on file  Social History Narrative   Married. 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 Is from [place].    Social Determinants of Health   Financial Resource  Strain:   . Difficulty of Paying Living Expenses:   Food Insecurity:   . Worried About Charity fundraiser in the Last Year:   . Arboriculturist in the Last Year:   Transportation Needs:   . Film/video editor (Medical):   Marland Kitchen Lack of Transportation (Non-Medical):   Physical Activity:   . Days of Exercise per Week:   . Minutes of Exercise per Session:   Stress:   . Feeling of Stress :   Social Connections:   . Frequency of Communication with Friends and Family:   . Frequency of Social Gatherings with Friends and Family:   . Attends Religious Services:   . Active Member of Clubs or Organizations:   . Attends Archivist Meetings:   .  Marital Status:    Family History  Problem Relation Age of Onset  . Cancer Mother        Possibly pancreatic. Died at age 64  . Prostate cancer Father   . Breast cancer Cousin   . Diabetes Paternal Uncle   . Heart disease Paternal Uncle    Scheduled Meds: . sodium chloride   Intravenous Once  . feeding supplement  1 Container Oral TID BM  . feeding supplement (PRO-STAT SUGAR FREE 64)  30 mL Oral BID  . ibuprofen  400 mg Oral Once  . pantoprazole (PROTONIX) IV  40 mg Intravenous Q12H  . sodium chloride flush  10-40 mL Intracatheter Q12H  . vancomycin  125 mg Oral QID  . vancomycin variable dose per unstable renal function (pharmacist dosing)   Does not apply See admin instructions   Continuous Infusions: . piperacillin-tazobactam (ZOSYN)  IV 12.5 mL/hr at 02/15/20 1200   PRN Meds:.hydrALAZINE, HYDROmorphone (DILAUDID) injection, LORazepam, ondansetron (ZOFRAN) IV, oxyCODONE, sodium chloride flush Medications Prior to Admission:  Prior to Admission medications   Medication Sig Start Date End Date Taking? Authorizing Provider  apixaban (ELIQUIS) 2.5 MG TABS tablet Take 1 tablet (2.5 mg total) by mouth 2 (two) times daily. 01/19/20 02/18/20 Yes Harold Hedge, MD  ferrous sulfate 325 (65 FE) MG tablet Take 1 tablet (325 mg total) by  mouth daily with breakfast. 01/19/20  Yes Harold Hedge, MD  hydrOXYzine (ATARAX/VISTARIL) 25 MG tablet Take 1 tablet (25 mg total) by mouth every 6 (six) hours. 02/06/20  Yes Darr, Marguerita Beards, PA-C  LORazepam (ATIVAN) 0.5 MG tablet Take 1 tablet (0.5 mg total) by mouth every 6 (six) hours as needed (Nausea or vomiting). 01/16/20  Yes Brunetta Genera, MD  ondansetron (ZOFRAN) 8 MG tablet Take 1 tablet (8 mg total) by mouth 2 (two) times daily as needed for refractory nausea / vomiting. Start on day 3 after carboplatin chemo. 10/06/19  Yes Brunetta Genera, MD  oxyCODONE (OXY IR/ROXICODONE) 5 MG immediate release tablet Take 1-2 tablets (5-10 mg total) by mouth every 4 (four) hours as needed for severe pain. 02/08/20  Yes Brunetta Genera, MD  carvedilol (COREG) 6.25 MG tablet Take 1 tablet (6.25 mg total) by mouth 2 (two) times daily with a meal. Patient not taking: Reported on 02/09/2020 01/20/20   Harold Hedge, MD   Allergies  Allergen Reactions  . Remdesivir Other (See Comments)    Chest tightness  . Chlorhexidine     PORT not accessed   Review of Systems +generalized weakness  Physical Exam Awake alert Appears with generalized weakness, chronically ill appearing.  Diminished breath sounds S1 S2 Abdomen is distended, generalized tenderness Has some edema Non focal Flat affect.  Vital Signs: BP (!) 154/71   Pulse (!) 122   Temp (!) 100.8 F (38.2 C)   Resp (!) 24   Ht 5\' 7"  (1.702 m)   Wt 72.1 kg   SpO2 100%   BMI 24.90 kg/m  Pain Scale: 0-10 POSS *See Group Information*: 1-Acceptable,Awake and alert Pain Score: 4    SpO2: SpO2: 100 % O2 Device:SpO2: 100 % O2 Flow Rate: .O2 Flow Rate (L/min): 2 L/min  IO: Intake/output summary:   Intake/Output Summary (Last 24 hours) at 02/15/2020 1232 Last data filed at 02/15/2020 1200 Gross per 24 hour  Intake 3070.54 ml  Output 425 ml  Net 2645.54 ml    LBM: Last BM Date: 02/14/20 Baseline Weight: Weight: 72.1 kg Most  recent weight: Weight: 72.1 kg     Palliative Assessment/Data:   PPS 30%  Time In:  11 Time Out:  12.10  Time Total:   70 min.  Greater than 50%  of this time was spent counseling and coordinating care related to the above assessment and plan.  Signed by: Loistine Chance, MD   Please contact Palliative Medicine Team phone at [phone] for questions and concerns.  For individual provider: See Shea Evans

## 2020-02-15 NOTE — Progress Notes (Signed)
Transitioning to hospice care transitioning to hospice/palliative focus Critical care will sign off  Erick Colace ACNP-BC [place] Pager # [phone] OR # [phone] if no answer

## 2020-02-15 NOTE — TOC Progression Note (Signed)
Transition of Care [facility]) - Progression Note    Patient Details  Name: [name] MRN: [id number] Date of Birth: 1960/12/29  Transition of Care [facility]) CM/SW Contact  Leeroy Cha, RN Phone Number: 02/15/2020, 2:08 PM  Clinical Narrative:    tct Farrel Gordon Eston Esters left on vocie mail concerning wishes of family and aptient for residential hospice.        Expected Discharge Plan and Services                                                 Social Determinants of Health (SDOH) Interventions    Readmission Risk Interventions No flowsheet data found.

## 2020-02-15 NOTE — Progress Notes (Addendum)
PROGRESS NOTE  [name] NUU:[id number] DOB: 07-Jan-1961 DOA: 02/09/2020 PCP: Patient, No Pcp Per   LOS: 6 days   Brief narrative: As per HPI,  [name] is a 59 y.o. 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 Patient reports he was diagnosed with liver cancer in Nov 2019. He has been following with Dr. Irene Limbo since that time and completed chemotherapy. Mets to back was already a suspicion. Over the last couple days reports vague abdominal and back pain.  He also reported nausea and vomiting but has been having multiple episodes of loose stools as well.  In the ED:  Patient was tachycardic, febrile but overall stable on room air. Labs remarkable for WBC 14.6, Hgb 7.6 and Lactate 3.7. Lipase, CMP and UA WNL. CXR: Low lung volumes. Right base atelectasis. Findings similar to prior study.  CT scan of the abdomen pelvis showed marked severity hepatomegaly with numerous heterogeneous low-attenuation liver lesions of various sizes, consistent with hepatic metastasis.. Findings consistent with osseous metastasis involving the T12, L2, L4 vertebral bodies and left iliac bone. Low-attenuation soft tissue masses within the pancreas which may represent a primary malignancy. Patient was given Zofran, Dilaudid and IVF.   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  He was initially on broad-spectrum  antibiotic but subsequently had multiple episodes of melena and C. difficile PCR was positive so he was briefly discontinued off antibiotic.  Patient subsequently had significantly elevated lactate so was restarted on broad-spectrum antibiotics including vancomycin and Zosyn since 02/13/2020.  Blood cultures from 02/13/2020 are negative so far.  Urinalysis and chest x-ray without any obvious source of infection.  On  p.o. vancomycin as well.  Patient is  spike of fever again today. As per discussion with Dr. Irene Limbo, oncologist, patient has metastatic small cell carcinoma is very aggressive and second line palliative chemotherapy has been recently completed.  He does not have much medical treatment options and he has significantly declined recently.Marland Kitchen  acute blood loss melena.  Hemoglobin of 6.4 on presentation.  GI has seen the patient and underwent first endoscopy.  There is no plan for further endoscopic evaluation.  Hemoglobin of 8.2 today after PRBC transfusion today..  Continue Protonix IV.  Patient and the family do not wish to undergo sedation or endoscopic evaluation.  Received 2 units of FFP yesterday and 1 unit of PRBC today.  diarrhea.  Possible C. difficile infection.  GI pathogen panel was negative.  On oral vancomycin.  Empirically on broad-spectrum antibiotic due to possibility of sepsis as well.   abdominal pain, back pain.  Likely secondary to metastatic cancer. On oxycodone po and iv dilaudid for severe pain.   Hypomagnesemia.  Improved with replacement.  Latest magnesium of 2.4.  Hypophosphatemia.  Replenished.  Phosphorus of 3.60  Gout Hold prednisone for now.  No acute flare..  Essential HTN Supposed to be on Coreg but not taking it due to some kind  of side effect.  Continue as needed hydralazine.   Add metoprolol.  Will closely monitor   Goals of care.  VTE Prophylaxis:  SCD due to GI bleed.  Code Status: DNR.  Family Communication: I had a prolonged discussion with the  patient, patient's spouse at bedside regarding goals of care and further plan of care.  Palliative care has seen the patient at this time.  At this time, patient and the family is leaning towards residential hospice.  Disposition Plan:  . Status is inpatient due to GI bleed sepsis transitioning to residential hospice.   . Patient is from home . Disposition to hospice -discussion for residential hospice at this time . Barriers to discharge: Hospice arrangement, sepsis, significant GI bleed with acute blood loss anemia, advanced cancer  Consultants:  GI  PCCM  Palliative care   Procedures:  Transfusion of packed RBC, FFP  Upper GI endoscopy 02/12/2020  Antibiotics:  . Vancomycin, metronidazole and cefepime- stopped 4/11 . Po Vancomycin 4/11> . Vancomycin, Zosyn 02/13/2020>>  Anti-infectives (From admission, onward)   Start     Dose/Rate Route Frequency Ordered Stop   02/15/20 0725  vancomycin variable dose per unstable renal function (pharmacist dosing)      Does not apply See admin instructions 02/15/20 0725     02/15/20 0230  vancomycin (VANCOREADY) IVPB 1250 mg/250 mL  Status:  Discontinued     1,250 mg 166.7 mL/hr over 90 Minutes Intravenous Every 24 hours 02/14/20 1333 02/15/20 0725   02/14/20 0800  vancomycin (VANCOREADY) IVPB 750 mg/150 mL  Status:  Discontinued     750 mg 150 mL/hr over 60 Minutes Intravenous Every 12 hours 02/13/20 1910 02/14/20 1333   02/14/20 0400  piperacillin-tazobactam (ZOSYN) IVPB 3.375 g     3.375 g 12.5 mL/hr over 240 Minutes Intravenous Every 8 hours 02/13/20 1858     02/13/20 1915  vancomycin (VANCOREADY) IVPB 1500 mg/300 mL     1,500 mg 150 mL/hr over 120 Minutes Intravenous STAT 02/13/20 1908 02/13/20 2235   02/13/20 1900  piperacillin-tazobactam (ZOSYN) IVPB 3.375 g     3.375 g 100 mL/hr over 30 Minutes Intravenous STAT 02/13/20 1857 02/13/20 2301   02/12/20 1400  vancomycin (VANCOCIN) 50 mg/mL oral solution 125 mg     125 mg Oral 4  times daily 02/12/20 1152 02/22/20 1359   02/10/20 2000  vancomycin (VANCOREADY) IVPB 1750 mg/350 mL  Status:  Discontinued     1,750 mg 175 mL/hr over 120 Minutes Intravenous Every 24 hours 02/10/20 0355 02/12/20 1250   02/10/20 0800  metroNIDAZOLE (FLAGYL) IVPB 500 mg  Status:  Discontinued     500 mg 100 mL/hr over 60 Minutes Intravenous Every 8 hours 02/10/20 0118 02/12/20 1319   02/10/20 0600  ceFEPIme (MAXIPIME) 2 g in sodium chloride 0.9 % 100 mL IVPB  Status:  Discontinued     2 g 200 mL/hr over 30 Minutes Intravenous Every 8 hours 02/10/20 0355 02/12/20 1250   02/09/20 2200  vancomycin (VANCOREADY) IVPB 1500 mg/300 mL     1,500 mg 150 mL/hr over 120 Minutes Intravenous  Once 02/09/20 2128 02/10/20 0310   02/09/20 2130  ceFEPIme (MAXIPIME) 2 g in sodium chloride 0.9 % 100 mL IVPB     2 g 200 mL/hr over 30 Minutes Intravenous  Once 02/09/20 2120 02/10/20 0032   02/09/20 2130  metroNIDAZOLE (FLAGYL) IVPB 500 mg     500 mg 100 mL/hr over 60 Minutes Intravenous  Once 02/09/20 2120 02/10/20 0106  02/09/20 2130  vancomycin (VANCOCIN) IVPB 1000 mg/200 mL premix  Status:  Discontinued     1,000 mg 200 mL/hr over 60 Minutes Intravenous  Once 02/09/20 2120 02/09/20 2128     Subjective: Today, patient was seen and examined at bedside.  Feels little better yesterday.  Denies overt pain, nausea or vomiting.  Spouse at bedside.  Objective: Vitals:   02/15/20 1211 02/15/20 1300  BP:  (!) 178/77  Pulse:  (!) 129  Resp:  (!) 30  Temp: (!) 100.8 F (38.2 C) (!) 100.9 F (38.3 C)  SpO2:  100%    Intake/Output Summary (Last 24 hours) at 02/15/2020 1559 Last data filed at 02/15/2020 1300 Gross per 24 hour  Intake 2793 ml  Output 950 ml  Net 1843 ml   Filed Weights   02/12/20 0700  Weight: 72.1 kg   Body mass index is 24.9 kg/m.   Physical Exam: General:  Average built, not in obvious distress, alert awake and communicative. HENT: Normocephalic, pupils equally reacting to  light and accommodation.  Pallor noted.  Mildly icteric sclera. Chest: Diminished breath sounds bilaterally.   CVS: S1 &S2 heard. No murmur.  Regular rate and rhythm. Abdomen: Soft, distended abdomen with enlarged liver/mass.  Tenderness noted on palpation.   Extremities: No cyanosis, clubbing but trace bilateral lower extremity edema.  Peripheral pulses are palpable. Psych: Alert, awake and oriented, communicative. CNS:  No cranial nerve deficits.  Generalized weakness noted.   Skin: Warm and dry.  No rashes noted.  Data Review: I have personally reviewed the following laboratory data and studies,  CBC: Recent Labs  Lab 02/13/20 0531 02/13/20 0531 02/13/20 1615 02/13/20 1615 02/14/20 0237 02/14/20 0500 02/14/20 1000 02/15/20 0453 02/15/20 1330  WBC 24.7*  --  33.8*  --   --  24.4*  --  24.2* 24.1*  HGB 7.6*   < > 5.9*   < > 7.8* 7.7* 7.5* 6.6* 8.2*  HCT 24.0*   < > 19.8*   < > 24.0* 23.4* 22.7* 20.0* 25.0*  MCV 92.7  --  96.6  --   --  90.3  --  92.2 91.2  PLT 124*  --  212  --   --  158  --  155 165   < > = values in this interval not displayed.   Basic Metabolic Panel: Recent Labs  Lab 02/11/20 0637 02/11/20 2956 02/12/20 0213 02/13/20 0531 02/14/20 0023 02/14/20 0500 02/15/20 0453  NA 135   < > 136 134* 134* 136 134*  K 4.2   < > 4.5 4.3 5.3* 4.0 3.5  CL 107   < > 110 106 110 111 109  CO2 11*   < > 16* 16* 8* 14* 14*  GLUCOSE 188*   < > 169* 194* 170* 178* 193*  BUN 32*   < > 37* 32* 31* 39* 51*  CREATININE 1.04   < > 1.01 1.07 1.30* 1.38* 1.71*  CALCIUM 7.7*   < > 7.3* 7.1* 5.9* 6.5* 6.3*  MG 1.6*  --  2.3 2.4  --  2.4 2.4  PHOS 2.4*  --   --  1.4*  --  3.6 3.0   < > = values in this interval not displayed.   Liver Function Tests: Recent Labs  Lab 02/10/20 0237 02/11/20 0637 02/13/20 0531 02/14/20 0728 02/15/20 0453  AST 31 34 156* 352* 191*  ALT 23 22 83* 163* 129*  ALKPHOS 89 66 63 62 66  BILITOT 1.3*  1.4* 2.2* 3.5* 3.7*  PROT 7.5 6.7 7.1 6.0*  6.2*  ALBUMIN 2.6* 2.2* 2.6* 2.1* 2.1*   Recent Labs  Lab 02/09/20 1543  LIPASE 21   No results for input(s): AMMONIA in the last 168 hours. Cardiac Enzymes: No results for input(s): CKTOTAL, CKMB, CKMBINDEX, TROPONINI in the last 168 hours. BNP (last 3 results) Recent Labs    05/10/19 0545 01/16/20 0405  BNP 40.1 81.9    ProBNP (last 3 results) No results for input(s): PROBNP in the last 8760 hours.  CBG: Recent Labs  Lab 02/13/20 1753  GLUCAP 151*   Recent Results (from the past 240 hour(s))  Urine culture     Status: None   Collection Time: 02/09/20  7:48 PM   Specimen: Urine, Clean Catch  Result Value Ref Range Status   Specimen Description   Final    URINE, CLEAN CATCH Performed at [facility], [place] [address], [place], [place] [address]    Special Requests   Final    NONE Performed at [facility], [place] [address], [place], [place] [address]    Culture   Final    NO GROWTH Performed at [facility], [place] [address], [place], [place] [address]    Report Status 02/11/2020 FINAL  Final  Culture, blood (routine x 2)     Status: None   Collection Time: 02/09/20  9:04 PM   Specimen: BLOOD  Result Value Ref Range Status   Specimen Description BLOOD BLOOD LEFT FOREARM  Final   Special Requests   Final    BOTTLES DRAWN AEROBIC AND ANAEROBIC Blood Culture results may not be optimal due to an excessive volume of blood received in culture bottles Performed at [facility], [place] [address], [place], [place] [address]    Culture NO GROWTH 5 DAYS  Final   Report Status 02/15/2020 FINAL  Final  Culture, blood (routine x 2)     Status: None   Collection Time: 02/09/20  9:45 PM   Specimen: BLOOD LEFT FOREARM  Result Value Ref Range Status   Specimen Description BLOOD LEFT FOREARM  Final   Special Requests   Final    BOTTLES DRAWN AEROBIC AND ANAEROBIC Blood Culture results may not be optimal due to an  excessive volume of blood received in culture bottles Performed at [facility], [place] [address] [place], [place] [address]    Culture NO GROWTH 5 DAYS  Final   Report Status 02/15/2020 FINAL  Final  SARS CORONAVIRUS 2 (TAT 6-24 HRS) Nasopharyngeal Nasopharyngeal Swab     Status: None   Collection Time: 02/10/20 12:10 AM   Specimen: Nasopharyngeal Swab  Result Value Ref Range Status   SARS Coronavirus 2 NEGATIVE NEGATIVE Final    Comment: (NOTE) SARS-CoV-2 target nucleic acids are NOT DETECTED. The SARS-CoV-2 RNA is generally detectable in upper and lower respiratory specimens during the acute phase of infection. Negative results do not preclude SARS-CoV-2 infection, do not rule out co-infections with other pathogens, and should not be used as the sole basis for treatment or other patient management decisions. Negative results must be combined with clinical observations, patient history, and epidemiological information. The expected result is Negative. Fact Sheet for Patients: SugarRoll.be Fact Sheet for Healthcare Providers: https://www.woods-mathews.com/ This test is not yet approved or cleared by the [place] FDA and  has been authorized for detection and/or diagnosis of SARS-CoV-2 by FDA under an Emergency Use Authorization (EUA). This  EUA will remain  in effect (meaning this test can be used) for the duration of the COVID-19 declaration under Section 56 4(b)(1) of the Act, 21 U.S.C. section 360bbb-3(b)(1), unless the authorization is terminated or revoked sooner. Performed at [facility], [place] [address], [place], [place] [address]   C Difficile Quick Screen w PCR reflex     Status: Abnormal   Collection Time: 02/11/20  3:00 PM   Specimen: STOOL  Result Value Ref Range Status   C Diff antigen POSITIVE (A) NEGATIVE Final   C Diff toxin NEGATIVE NEGATIVE Final   C Diff interpretation Results are  indeterminate. See PCR results.  Final    Comment: Performed at [facility], [place] [address], [place], [place] [address]  Gastrointestinal Panel by PCR , Stool     Status: None   Collection Time: 02/11/20  3:00 PM   Specimen: STOOL  Result Value Ref Range Status   Campylobacter species NOT DETECTED NOT DETECTED Final   Plesimonas shigelloides NOT DETECTED NOT DETECTED Final   Salmonella species NOT DETECTED NOT DETECTED Final   Yersinia enterocolitica NOT DETECTED NOT DETECTED Final   Vibrio species NOT DETECTED NOT DETECTED Final   Vibrio cholerae NOT DETECTED NOT DETECTED Final   Enteroaggregative E coli (EAEC) NOT DETECTED NOT DETECTED Final   Enteropathogenic E coli (EPEC) NOT DETECTED NOT DETECTED Final   Enterotoxigenic E coli (ETEC) NOT DETECTED NOT DETECTED Final   Shiga like toxin producing E coli (STEC) NOT DETECTED NOT DETECTED Final   Shigella/Enteroinvasive E coli (EIEC) NOT DETECTED NOT DETECTED Final   Cryptosporidium NOT DETECTED NOT DETECTED Final   Cyclospora cayetanensis NOT DETECTED NOT DETECTED Final   Entamoeba histolytica NOT DETECTED NOT DETECTED Final   Giardia lamblia NOT DETECTED NOT DETECTED Final   Adenovirus F40/41 NOT DETECTED NOT DETECTED Final   Astrovirus NOT DETECTED NOT DETECTED Final   Norovirus GI/GII NOT DETECTED NOT DETECTED Final   Rotavirus A NOT DETECTED NOT DETECTED Final   Sapovirus (I, II, IV, and V) NOT DETECTED NOT DETECTED Final    Comment: Performed at [facility], [place] [place], [place] [address]  C. Diff by PCR, Reflexed     Status: Abnormal   Collection Time: 02/11/20  3:00 PM  Result Value Ref Range Status   Toxigenic C. Difficile by PCR POSITIVE (A) NEGATIVE Final    Comment: Positive for toxigenic C. difficile with little to no toxin production. Only treat if clinical presentation suggests symptomatic illness. Performed at [facility], [place] [address], [place], [place] [address]     Culture, blood (Routine X 2) w Reflex to ID Panel     Status: None (Preliminary result)   Collection Time: 02/13/20  7:45 PM   Specimen: BLOOD RIGHT HAND  Result Value Ref Range Status   Specimen Description BLOOD RIGHT HAND  Final   Special Requests   Final    BOTTLES DRAWN AEROBIC ONLY Blood Culture results may not be optimal due to an inadequate volume of blood received in culture bottles Performed at [facility], [place] [address] [place], [place] [address]    Culture NO GROWTH 2 DAYS  Final   Report Status PENDING  Incomplete  Culture, blood (Routine X 2) w Reflex to ID Panel     Status: None (Preliminary result)   Collection Time: 02/13/20  7:45 PM   Specimen: BLOOD  Result Value Ref Range Status   Specimen  Description BLOOD RIGHT WRIST  Final   Special Requests   Final    BOTTLES DRAWN AEROBIC ONLY Blood Culture adequate volume Performed at [place] [address], [place], [place] [address]    Culture NO GROWTH 2 DAYS  Final   Report Status PENDING  Incomplete  Culture, Urine     Status: None   Collection Time: 02/13/20  9:30 PM   Specimen: Urine, Random  Result Value Ref Range Status   Specimen Description   Final    URINE, RANDOM Performed at [place] [address] [place], [place] [address]    Special Requests   Final    NONE Performed at [facility], [place] [address] [place], [place] [address]    Culture   Final    NO GROWTH Performed at [facility], [place] [address] [place], [place] [address]    Report Status 02/15/2020 FINAL  Final  MRSA PCR Screening     Status: None   Collection Time: 02/14/20  9:43 AM   Specimen: Nasal Mucosa; Nasopharyngeal  Result Value Ref Range Status   MRSA by PCR NEGATIVE NEGATIVE Final    Comment:        The GeneXpert MRSA Assay (FDA approved for NASAL specimens only), is one component of a comprehensive MRSA colonization surveillance  program. It is not intended to diagnose MRSA infection nor to guide or monitor treatment for MRSA infections. Performed at [facility], [place] [address] [place], [place] [address]      Studies: DG CHEST PORT 1 VIEW  Result Date: 02/13/2020 CLINICAL DATA:  Abdominal and back pain with likely spinal metastasis in the setting of metastatic neuroendocrine carcinoma EXAM: PORTABLE CHEST 1 VIEW COMPARISON:  Radiograph 02/09/2020 FINDINGS: Low lung volumes with basilar atelectatic changes. Lungs are otherwise clear. Cardiomediastinal contours are stable. Right IJ approach Port-A-Cath tip terminates at the superior cavoatrial junction. Telemetry leads overlie the chest. No acute osseous or soft tissue abnormality. IMPRESSION: Low lung volumes with basilar atelectatic changes. No consolidation. Electronically Signed   By: Lovena Le M.D.   On: 02/13/2020 16:37   CT Angio Abd/Pel w/ and/or w/o  Result Date: 02/13/2020 CLINICAL DATA:  Metastatic neuroendocrine carcinoma, diffuse liver metastases, spinal metastases, gastrointestinal bleeding EXAM: CTA ABDOMEN AND PELVIS WITHOUT AND WITH CONTRAST TECHNIQUE: Multidetector CT imaging of the abdomen and pelvis was performed using the standard protocol during bolus administration of intravenous contrast. Multiplanar reconstructed images and MIPs were obtained and reviewed to evaluate the vascular anatomy. CONTRAST:  161mL OMNIPAQUE IOHEXOL 350 MG/ML SOLN COMPARISON:  02/09/2020, 01/15/2020 FINDINGS: VASCULAR Aorta: Normal caliber aorta without aneurysm, dissection, vasculitis or significant stenosis. Celiac: Patent without evidence of aneurysm, dissection, vasculitis or significant stenosis. SMA: Patent without evidence of aneurysm, dissection, vasculitis or significant stenosis. Renals: Both renal arteries are patent without evidence of aneurysm, dissection, vasculitis, fibromuscular dysplasia or significant stenosis. IMA: Patent without  evidence of aneurysm, dissection, vasculitis or significant stenosis. Inflow: Patent without evidence of aneurysm, dissection, vasculitis or significant stenosis. Proximal Outflow: Bilateral common femoral and visualized portions of the superficial and profunda femoral arteries are patent without evidence of aneurysm, dissection, vasculitis or significant stenosis. Veins: No obvious venous abnormality within the limitations of this arterial phase study. Review of the MIP images confirms the above findings. NON-VASCULAR Lower chest: No acute pleural or parenchymal lung disease. Hepatobiliary: Diffuse hepatic metastases are again identified, unchanged. Gallbladder is unremarkable. Pancreas: Hypodense mass at  the junction of the body and tail the pancreas may reflect primary malignancy or metastatic disease. This is stable. Spleen: Normal in size without focal abnormality. Adrenals/Urinary Tract: Adrenal glands are unremarkable. Kidneys are normal, without renal calculi, focal lesion, or hydronephrosis. Bladder is unremarkable. Foley catheter decompresses the bladder. Stomach/Bowel: Stomach is markedly distended. There is no bowel obstruction or ileus. Abnormal wall thickening is seen within the mid to distal jejunum within the lower abdomen and pelvis, which could reflect inflammation, infection, or ischemia. No evidence of pneumatosis. High attenuation of bowel contents within the stomach and proximal colon limit evaluation for active gastrointestinal hemorrhage. There is evidence of contrast accumulation within the distal rectum just proximal to the anal verge, only seen on delayed imaging, which may reflect either mucosal hyperemia or active bleeding at the anal verge. No other signs of active gastrointestinal bleeding. Lymphatic: No pathologic adenopathy. Reproductive: Prostate is unremarkable. Other: No free fluid or free gas within the abdomen or pelvis. Musculoskeletal: Diffuse bony metastases involving the  thoracolumbar spine are unchanged. No pathologic fracture. IMPRESSION: VASCULAR 1. Accumulation of contrast within the distal rectum at the anal verge. This could reflect bleeding internal hemorrhoids. Please correlate with physical exam and direct visual inspection. 2. Otherwise unremarkable appearance of the abdominal and pelvic vasculature. NON-VASCULAR 1. Wall thickening of the mid to distal jejunum within the lower abdomen and pelvis, which may be inflammatory, infectious, or ischemic. 2. Stable diffuse liver metastases. 3. Stable hypodense mass within the pancreatic tail, concerning for primary malignancy. 4. Stable bony metastases. Electronically Signed   By: Randa Ngo M.D.   On: 02/13/2020 21:21     Flora Lipps, MD  [facility] 02/15/2020

## 2020-02-15 NOTE — Progress Notes (Signed)
Engineer, maintenance [facility]) Hospital Liaison note.   Received request from [place], for family interest in [facility]. Chart reviewed and eligibility confirmed.   Left voicemail for wife to return call.   [facility] will notify TOC when registration paperwork has been completed to arrange transport.  RN please call report to [phone].   Thank you,   Farrel Gordon, RN, CCM  [place] (listed on [place] [facility])  [phone]

## 2020-02-15 NOTE — Progress Notes (Signed)
[facility] Progress Note Patient Name: Yuepheng Schaller DOB: [birth year] MRN: [id number]   Date of Service  02/15/2020  HPI/Events of Note  Hypocalcemia - Ca++- 6.3 which corrects to 7.82 (Low) given albumin = 2.1.   eICU Interventions  Will replace Ca++.      Intervention Category Major Interventions: Electrolyte abnormality - evaluation and management  Coron Rossano [place] 02/15/2020, 6:40 AM

## 2020-02-15 NOTE — Progress Notes (Signed)
Pharmacy Antibiotic Note  [name] is a 59 y.o. male admitted on 02/09/2020 with sepsis. Currently being treated with PO Vancomycin for possible C.diff infection. Rapid response called today due to tachypnea and tachycardia. Resuming IV antibiotics per MD. Pharmacy has been consulted for Vancomycin and Zosyn dosing.  02/15/20 - SCr bumped again - WBC remain elevated, Tm 99.7  Plan: - Continue Zosyn 3.375 g EI q 8 hours - Change vancomycin to variable dose for unstable renal function - Check random vancomycin level 4/15 - TRH wants to continue vancomycin despite negative MRSA PCR due to severity of illness - F/U renal function and culture results   Height: 5\' 7"  (170.2 cm) Weight: 72.1 kg (159 lb) IBW/kg (Calculated) : 66.1  Temp (24hrs), Avg:99.5 F (37.5 C), Min:98.6 F (37 C), Max:100 F (37.8 C)  Recent Labs  Lab 02/12/20 0213 02/12/20 0213 02/13/20 0531 02/13/20 1615 02/13/20 2130 02/14/20 0023 02/14/20 0500 02/14/20 0830 02/15/20 0453  WBC 20.0*  --  24.7* 33.8*  --   --  24.4*  --  24.2*  CREATININE 1.01  --  1.07  --   --  1.30* 1.38*  --  1.71*  LATICACIDVEN 3.4*   < > 3.7* >11.0* 10.5*  --   --  3.3* 2.9*   < > = values in this interval not displayed.    Estimated Creatinine Clearance: 43.5 mL/min (A) (by C-G formula based on SCr of 1.71 mg/dL (H)).    Allergies  Allergen Reactions  . Remdesivir Other (See Comments)    Chest tightness  . Chlorhexidine     PORT not accessed    Antimicrobials this admission: 4/8 Cefepime >> 4/11 4/9 Vancomycin (IV) >> 4/11, resumed 4/12 >> 4/9 Flagyl >> 4/11 4/11 Vancomycin PO >> (4/21) 4/12 Zosyn >>  Microbiology results: 4/8 BCx: NGTD 4/8 UCx: NGF 4/9 COVID: negative  4/10 C.diff quick screen: antigen positive, toxin negative 4/10 C.diff by PCR, reflexed: positive  4/10 GI panel: negative 4/12 BCx: ngtd 4/12 UCx: sent 4/12 MRSA PCR: neg   Thank you for allowing pharmacy to be a part of this patient's  care.   Ulice Dash, PharmD, BCPS 02/15/2020 7:28 AM

## 2020-02-15 NOTE — Progress Notes (Signed)
[facility] Progress Note Patient Name: [name] DOB: 05-Oct-1961 MRN: [id number]   Date of Service  02/15/2020  HPI/Events of Note  Anemia - Hgb = 6.6.   eICU Interventions  Will transfuse 1 unit PRBC now.      Intervention Category Major Interventions: Other:  Lysle Dingwall 02/15/2020, 5:25 AM

## 2020-02-15 NOTE — Progress Notes (Signed)
CRITICAL VALUE ALERT  Critical Value:  Calcium 6.3   Date & Time Notied:  02/15/2020 0630  Provider Notified: E-Link   Orders Received/Actions taken: waiting for new orders.

## 2020-02-15 NOTE — Progress Notes (Signed)
CRITICAL VALUE ALERT  Critical Value:  Hgb 6.6  Date & Time Notied:  02/15/2020 0515   Provider Notified: E-Link   Orders Received/Actions taken: see new orders.

## 2020-02-16 ENCOUNTER — Encounter: Payer: Self-pay | Admitting: Hematology

## 2020-02-16 ENCOUNTER — Inpatient Hospital Stay: Payer: Medicaid Other | Admitting: Hematology

## 2020-02-16 DIAGNOSIS — R0600 Dyspnea, unspecified: Secondary | ICD-10-CM

## 2020-02-16 LAB — TYPE AND SCREEN
ABO/RH(D): A POS
Antibody Screen: NEGATIVE
Unit division: 0

## 2020-02-16 LAB — CBC
HCT: 23.1 % — ABNORMAL LOW (ref 39.0–52.0)
Hemoglobin: 7.6 g/dL — ABNORMAL LOW (ref 13.0–17.0)
MCH: 30.3 pg (ref 26.0–34.0)
MCHC: 32.9 g/dL (ref 30.0–36.0)
MCV: 92 fL (ref 80.0–100.0)
Platelets: 141 10*3/uL — ABNORMAL LOW (ref 150–400)
RBC: 2.51 MIL/uL — ABNORMAL LOW (ref 4.22–5.81)
RDW: 18.9 % — ABNORMAL HIGH (ref 11.5–15.5)
WBC: 19.4 10*3/uL — ABNORMAL HIGH (ref 4.0–10.5)
nRBC: 0.2 % (ref 0.0–0.2)

## 2020-02-16 LAB — BASIC METABOLIC PANEL
Anion gap: 11 (ref 5–15)
BUN: 55 mg/dL — ABNORMAL HIGH (ref 6–20)
CO2: 15 mmol/L — ABNORMAL LOW (ref 22–32)
Calcium: 6.4 mg/dL — CL (ref 8.9–10.3)
Chloride: 105 mmol/L (ref 98–111)
Creatinine, Ser: 2.01 mg/dL — ABNORMAL HIGH (ref 0.61–1.24)
GFR calc Af Amer: 41 mL/min — ABNORMAL LOW (ref >60)
GFR calc non Af Amer: 35 mL/min — ABNORMAL LOW (ref >60)
Glucose, Bld: 165 mg/dL — ABNORMAL HIGH (ref 70–99)
Potassium: 3.4 mmol/L — ABNORMAL LOW (ref 3.5–5.1)
Sodium: 131 mmol/L — ABNORMAL LOW (ref 135–145)

## 2020-02-16 LAB — BPAM RBC
Blood Product Expiration Date: 202104252359
ISSUE DATE / TIME: 202104140628
Unit Type and Rh: 6200

## 2020-02-16 LAB — VANCOMYCIN, RANDOM: Vancomycin Rm: 21

## 2020-02-16 MED ORDER — VANCOMYCIN 50 MG/ML ORAL SOLUTION: 125.0000 mg | Freq: Four times a day (QID) | ORAL | 0 refills | Status: AC

## 2020-02-16 MED ORDER — POTASSIUM CHLORIDE CRYS ER 20 MEQ PO TBCR: 20.0000 meq | EXTENDED_RELEASE_TABLET | Freq: Every day | ORAL | 0 refills | Status: AC

## 2020-02-16 MED ORDER — CALCIUM CARBONATE 1250 (500 CA) MG PO TABS
500.0000 mg | ORAL_TABLET | Freq: Two times a day (BID) | ORAL | Status: DC
  Administered 2020-02-16: 500 mg via ORAL
  Filled 2020-02-16: qty 1

## 2020-02-16 MED ORDER — SODIUM CHLORIDE 1 G PO TABS
1.0000 g | ORAL_TABLET | Freq: Once | ORAL | Status: AC
  Administered 2020-02-16: 1 g via ORAL
  Filled 2020-02-16: qty 1

## 2020-02-16 MED ORDER — PANTOPRAZOLE SODIUM 40 MG PO TBEC: 40.0000 mg | DELAYED_RELEASE_TABLET | Freq: Two times a day (BID) | ORAL | Status: AC

## 2020-02-16 MED ORDER — CALCIUM GLUCONATE-NACL 2-0.675 GM/100ML-% IV SOLN
2.0000 g | Freq: Once | INTRAVENOUS | Status: AC
  Administered 2020-02-16: 2000 mg via INTRAVENOUS
  Filled 2020-02-16: qty 100

## 2020-02-16 MED ORDER — METOPROLOL TARTRATE 25 MG PO TABS: 25.0000 mg | ORAL_TABLET | Freq: Two times a day (BID) | ORAL | Status: AC

## 2020-02-16 MED ORDER — POTASSIUM CHLORIDE CRYS ER 20 MEQ PO TBCR
20.0000 meq | EXTENDED_RELEASE_TABLET | Freq: Two times a day (BID) | ORAL | Status: DC
  Administered 2020-02-16: 20 meq via ORAL
  Filled 2020-02-16: qty 1

## 2020-02-16 NOTE — Progress Notes (Signed)
Daily Progress Note   Patient Name: [name]       Date: 02/16/2020 DOB: July 28, 1961  Age: 59 y.o. MRN#: [id number] Attending Physician: Flora Lipps, MD Primary Care Physician: Patient, No Pcp Per Admit Date: 02/09/2020  Reason for Consultation/Follow-up: Establishing goals of care  Subjective: Awake alert resting in bed.  Abdominal distention and discomfort.  Length of Stay: 7  Current Medications: Scheduled Meds:  . sodium chloride   Intravenous Once  . calcium carbonate  500 mg of elemental calcium Oral BID WC  . mouth rinse  15 mL Mouth Rinse BID  . metoprolol tartrate  25 mg Oral BID  . pantoprazole (PROTONIX) IV  40 mg Intravenous Q12H  . potassium chloride  20 mEq Oral BID  . sodium chloride flush  10-40 mL Intracatheter Q12H  . vancomycin  125 mg Oral QID  . vancomycin variable dose per unstable renal function (pharmacist dosing)   Does not apply See admin instructions    Continuous Infusions: . piperacillin-tazobactam (ZOSYN)  IV 3.375 g (02/16/20 1229)    PRN Meds: hydrALAZINE, HYDROmorphone (DILAUDID) injection, LORazepam, ondansetron (ZOFRAN) IV, oxyCODONE, sodium chloride flush  Physical Exam         Awake alert oriented Responds appropriately Abdominal mass, abdominal distention S1-S2 Regular work of breathing Generalized weakness Trace edema  Vital Signs: BP (!) 122/58   Pulse 85   Temp 99.5 F (37.5 C)   Resp 20   Ht 5\' 7"  (1.702 m)   Wt 72.1 kg   SpO2 100%   BMI 24.90 kg/m  SpO2: SpO2: 100 % O2 Device: O2 Device: Room Air O2 Flow Rate: O2 Flow Rate (L/min): 2 L/min  Intake/output summary:   Intake/Output Summary (Last 24 hours) at 02/16/2020 1247 Last data filed at 02/16/2020 0630 Gross per 24 hour  Intake 89.6 ml  Output 1125 ml    Net -1035.4 ml   LBM: Last BM Date: 02/15/20 Baseline Weight: Weight: 72.1 kg Most recent weight: Weight: 72.1 kg       Palliative Assessment/Data:      Patient Active Problem List   Diagnosis Date Noted  . Upper GI bleed   . Sepsis ([place]) 01/15/2020  . Fever   . Flank pain   . Cellulitis 12/11/2018  . Port-A-Cath in place 11/15/2018  . Bone metastases (  [place]) 10/20/2018  . 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 Counseling regarding advance care planning and goals of care 10/20/2018  . Gout 03/21/2016  . Chest pain 02/18/2012  . Alcohol abuse 02/18/2012  . Syncope 05/28/2011  . Palpitations 05/28/2011  . 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 difficile infection Ongoing abdominal pain and back pain Electrolyte abnormalities Functional decline palliative performance scale 40%  Recommendations/Plan:  Continue current mode of care.  Ongoing conversations with patient and wife about residential hospice.  Patient with markedly limited prognosis, aggressive malignancy and functional decline.  Offered active listening and supportive care.  Have had several discussions with patient as well as family about considering comfort focused care and the fact that choosing hospice does not mean giving up, means prioritizing symptom management and prioritizing efforts to be as well as he could be for as long as he could be.  Goals of Care and Additional Recommendations:  Limitations on Scope of Treatment:   Code Status:    Code Status Orders  (From admission, onward)         Start     Ordered   02/13/20 2051  Do not attempt resuscitation (DNR)  Continuous    Question Answer Comment  In the event of cardiac or respiratory ARREST Do not call a "code blue"   In the  event of cardiac or respiratory ARREST Do not perform Intubation, CPR, defibrillation or ACLS   In the event of cardiac or respiratory ARREST Use medication by any route, position, wound care, and other measures to relive pain and suffering. May use oxygen, suction and manual treatment of airway obstruction as needed for comfort.      02/13/20 2050        Code Status History    Date Active Date Inactive Code Status Order ID Comments User Context   02/10/2020 0118 02/13/2020 2050 Full Code [id number]  Chauncey Mann, MD ED   01/16/2020 0003 01/20/2020 0031 Full Code [id number]  Sid Falcon, MD ED   02/18/2012 0139 02/18/2012 2343 Full Code [id number]  Reche Dixon, RN Inpatient   Advance Care Planning Activity       Prognosis:   < 2 weeks  Discharge Planning:  Hospice facility  Care plan was discussed with patient, wife, hospice liaison, [facility] MD  Thank you for allowing the Palliative Medicine Team to assist in the care of this patient.   Time In:  10 Time Out: 10.25 Total Time 25 Prolonged Time Billed  no       Greater than 50%  of this time was spent counseling and coordinating care related to the above assessment and plan.  Loistine Chance, MD  Please contact Palliative Medicine Team phone at [phone] for questions and concerns.

## 2020-02-16 NOTE — Progress Notes (Signed)
Nutrition Follow-up  RD working remotely.   DOCUMENTATION CODES:   Not applicable  INTERVENTION:  - will d/c Boost Breeze and prostat as patient is NPO. - will monitor plan concerning discharge.    NUTRITION DIAGNOSIS:   Increased nutrient needs related to cancer and cancer related treatments as evidenced by estimated needs. 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 CT abdomen/pelvis showed marked severity hepatomegaly with numerous liver lesions consistent with hepatic metastasis, osseous metastasis involving T12, L2,L4 vertebral bodies and left iliac bone.  Diet has been either NPO or CLD since admission. He has been on CLD since 4/13 at 1825. He has been refusing and not receiving oral nutrition supplements so will discontinue them. He has not been weighed since 4/11.  Per notes: extensive [place] discussion had yesterday and there is a tentative plan for patient to discharge to residential hospice.     Labs reviewed; Na: 131 mmol/l, K: 3.4 mmol/l, BUN: 55 mg/dl, creatinine: 2.01 mg/dl, Ca: 6.4 mg/dl, GFR: 41 ml/min. Medications reviewed; 1 tablet oscal x2 doses 4/15, 40 mg IV protonix BID, 20 mEq Klor-Con x2 doses 4/15, 1 tablet NaCl x1 dose 4/15.    NUTRITION - FOCUSED PHYSICAL EXAM:  unable to complete at this time.   Diet Order:   Diet Order            Diet clear liquid Room service appropriate? Yes; Fluid consistency: Thin  Diet effective now              EDUCATION NEEDS:   No education needs have been identified at this time  Skin:  Skin Assessment: Reviewed RN Assessment  Last BM:  4/14  Height:   Ht  Readings from Last 1 Encounters:  02/12/20 5\' 7"  (1.702 m)    Weight:   Wt Readings from Last 1 Encounters:  02/12/20 72.1 kg    Estimated Nutritional Needs:  Kcal:  2175-2300 Protein:  105-115 Fluid:  >/= 2.1 L/day     Jarome Matin, MS, RD, LDN, CNSC Inpatient Clinical Dietitian RD pager # available in [facility]  After hours/weekend pager # available in [facility]

## 2020-02-16 NOTE — Progress Notes (Signed)
CRITICAL VALUE ALERT  Critical Value:  Calcium 6.4    Date & Time Notified:  02/16/2020 0415  Provider Notified: Sharlet Salina NP  Orders Received/Actions taken: Awaiting new orders

## 2020-02-16 NOTE — Progress Notes (Signed)
[facility] Documentation     Pt has been approved for [employer] [facility] [employer] transfer. [facility] does have a bed available for pt today. Liaison will alert TOC once paperwork has been completed and transportation can be arranged.      Please call [place] at [phone] to give charge nurse report and fax discharge summary to [phone].     Please dc any lines. May leave catheter in place if pt has one. Please send pt to [facility] with DNR paperwork.      Please call with any questions.      Thank you,   Freddie Breech, RN

## 2020-02-16 NOTE — Discharge Summary (Signed)
Physician Discharge Summary  [name] BJS:[id number] DOB: 12/23/1960 DOA: 02/09/2020  PCP: Patient, No Pcp Per  Admit date: 02/09/2020 Discharge date: 02/16/2020  Admitted From: Home  Discharge disposition: Hospice- residential hospice  Recommendations for Outpatient Follow-Up:   Follow up with your hospice care provider on discharge.  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  Diet recommendation: .  Regular.  Wound care: None.  Code status: DNR   History of Present Illness:   [name] a 59 y.o.malewith PMH of HTN, gout, OSA andmetastatic neuroendocrine carcinoma with metastatic disease to liverwho presented to the ER with abdominal and back pain and was found to have likely mets to his spine.Patient reported that he was diagnosed with liver cancer in Nov 2019. He had been following with Dr. Irene Limbo since that time and completed chemotherapy. Mets to back was already a suspicion. Over the few days prior to presentation, patient reported vague abdominal and back pain.  He also reported nausea and vomiting but had been having multiple episodes of loose stools as well.  In the ED: Patient was tachycardic, febrile but overall stable on room air. Labs were remarkable for WBC 14.6, Hgb 7.6 and Lactate 3.7. Lipase, CMP and UA WNL. CXR:Low lung volumes. Right base atelectasis. Findings similar to prior study.  CT scan of the abdomen pelvis showed marked severity hepatomegaly with numerous heterogeneous low-attenuation liver lesions of various sizes, consistent with hepatic metastasis.. Findings consistent with osseous metastasis involving the T12, L2, L4 vertebral bodies and left iliac bone. Low-attenuation soft tissue masses within the pancreas which may represent a  primary malignancy. Patient was given Zofran, Dilaudid and IVF and was considered for admission to the hospital.   Hospital Course:   Following conditions were addressed during hospitalization as listed below,  Severe sepsis in the setting of Metastatic neuroendocrine cancer-metastasis to liver and bone  Patient had presented with fever and tachycardia in setting of immunosuppression from malignancy with elevated lactate at 3.7, leukocytosis and elevated procalcitonin on presentation.  He was initially on broad-spectrum antibiotics but subsequently had multiple episodes of melena and C. difficile PCR was positive so he was briefly discontinued off antibiotic.  Patient subsequently had significantly elevated lactate so was restarted on broad-spectrum antibiotics including vancomycin and Zosyn since 02/13/2020.  Blood cultures from 02/13/2020 are negative so far.  Urinalysis and chest x-ray without any obvious source of infection.  Patient was continued on p.o. vancomycin in addition of the antibiotics will be discontinued on discharge.   As per Irene Limbo, oncologist, patient has metastatic small cell carcinoma which is very aggressive and second line palliative chemotherapy has been recently completed.  He does not have much medical treatment options and he has significantly declined recently.  Patient was subsequently seen by palliative care and also spoke with multiple family members including the brother spouse and the patient himself.  The decision was to proceed with hospice palliative care.  I again spoke with palliative care prior to discharge.  acute blood loss melena.  Hemoglobin of 6.4 on presentation.  GI was consulted and underwent first endoscopy.  There was no plan for further endoscopic evaluation since the patient did not want to go through sedation and risk of further complication..  Hemoglobin of 7.6 after PRBC transfusion.  We will continue p.o. Protonix.   Received FFP and transfusion  during hospitalization.  diarrhea.  Possible C. difficile infection.  GI pathogen panel was negative.  On oral vancomycin.  We will continue to complete a 10-day course   abdominal pain, back pain.  Likely secondary to metastatic cancer. On oxycodone po and received iv dilaudid for severe pain.  Patient will continue pain medication regimen at the residential hospice facility.  Hypomagnesemia/Hypophosphatemia.  Replenished  Gout Did not have any acute flare.  Essential HTN Supposed to be on Coreg but not taking it due to some kind of side effect.    Tolerated metoprolol.  Will continue on discharge   Disposition.  At this time, patient has been considered for residential hospice facility placement.  Spoke with the patient's spouse prior to discharge.  Medical Consultants:    GI  PCCM  Palliative care  Procedures:     Transfusion of packed RBC, FFP  Upper GI endoscopy 02/12/2020   Subjective:   Today, patient feels okay.  Denies overt pain, nausea, vomiting or bleeding  Discharge Exam:   Vitals:   02/16/20 1100 02/16/20 1200  BP: 119/62   Pulse: 76 85  Resp: (!) 21 20  Temp: 99.1 F (37.3 C) 99.5 F (37.5 C)  SpO2: 99% 100%   Vitals:   02/16/20 1000 02/16/20 1015 02/16/20 1100 02/16/20 1200  BP: 136/66  119/62   Pulse: 99 (!) 105 76 85  Resp: (!) 24 (!) 23 (!) 21 20  Temp: 99.1 F (37.3 C) 99.1 F (37.3 C) 99.1 F (37.3 C) 99.5 F (37.5 C)  TempSrc:      SpO2: 100% 100% 99% 100%  Weight:      Height:       General: Alert awake, not in obvious distress, average built HENT: pupils equally reacting to light, pallor noted.  Mildly icteric sclera.  Oral mucosa is moist.  Chest: Diminished breath sounds bilaterally.   CVS: S1 &S2 heard. No murmur.  Regular rate and rhythm. Abdomen: Soft, distended abdomen with enlarged liver/mass.  Tenderness noted on palpation, bowel sounds are heard.   Extremities: No cyanosis, clubbing but with bilateral lower  extremity trace edema.  Peripheral pulses are palpable. Psych: Alert, awake and oriented, normal mood CNS:  No cranial nerve deficits.  Generalized weakness noted. Skin: Warm and dry.  No rashes noted.  The results of significant diagnostics from this hospitalization (including imaging, microbiology, ancillary and laboratory) are listed below for reference.     Diagnostic Studies:   MR THORACIC SPINE W WO CONTRAST  Result Date: 02/10/2020 CLINICAL DATA:  Initial evaluation for lower back pain with elevated lactic acid, evaluate for spinal infection. History of metastatic neuroendocrine tumor. EXAM: MRI THORACIC AND LUMBAR SPINE WITHOUT AND WITH CONTRAST TECHNIQUE: Multiplanar and multiecho pulse sequences of the thoracic and lumbar spine were obtained without and with intravenous contrast. CONTRAST:  7.24mL GADAVIST GADOBUTROL 1 MMOL/ML IV SOLN COMPARISON:  Prior CT from earlier the same day as well as previous PET-CT from 10/15/2018. FINDINGS: MRI THORACIC SPINE FINDINGS Alignment: Physiologic with preservation of the normal thoracic kyphosis. No listhesis. Vertebrae: Multiple scattered osseous metastases seen throughout the thoracic spine. These are seen at numerous levels, including the T2, T4, T5, T6, T9, and T11 levels. For reference purposes, largest of these lesions is seen at T6, with the T6 vertebral body is largely replaced by tumor. Vertebral body height maintained without associated pathologic fracture. There is associated epidural extension with tumor  seen extending into the ventral epidural space at the levels of T6 (series 29, image 19), and T11 (series 29, image 43). Associated mild to moderate spinal stenosis at these levels, most pronounced at T11. No other significant epidural or extraosseous tumor. Underlying bone marrow signal intensity diffusely decreased on T1 weighted sequence, likely related to history of malignancy and post treatment changes. No findings to suggest osteomyelitis  discitis or septic arthritis. Cord: Signal intensity within the thoracic spinal cord is within normal limits. No epidural abscess or other collection. Paraspinal and other soft tissues: Paraspinous soft tissues demonstrate no acute finding. Minimal atelectatic changes noted within the partially visualized lungs. Partially visualized liver is markedly enlarged with innumerable metastatic lesions, better seen on prior CT. Disc levels: T4-5: Small central disc protrusion mildly indents the ventral thecal sac. No significant stenosis. T8-9: Mild disc bulge, eccentric to the left. No significant stenosis. No other significant disc pathology seen within the thoracic spine. No other significant stenosis. MRI LUMBAR SPINE FINDINGS Segmentation: Transitional lumbosacral anatomy with sacralized L5 vertebral body. Last well-formed disc space labeled L4-5 on this exam. Alignment: Physiologic with preservation of the normal lumbar lordosis. No listhesis. Vertebrae: Multiple scattered osseous metastases seen throughout the lumbar spine, specifically involving the L1 and L3 vertebral bodies. For reference purposes, the largest lesion is seen at L3 and measures 2.4 cm in size. Probable few additional lesions noted within the partially visualized pelvis. No extra osseous or epidural tumor within the lumbar spine. Underlying bone marrow signal intensity diffusely decreased on T1 weighted imaging. No evidence for osteomyelitis discitis or septic arthritis. Conus medullaris: Extends to the T12-L1 level and appears normal. Paraspinal and other soft tissues: Paraspinous soft tissues demonstrate no acute finding. Liver is markedly enlarged with innumerable metastases, better seen on prior CT. Disc levels: Diffuse congenital shortening of the pedicles noted. L1-2:  Negative interspace.  Mild facet hypertrophy.  No stenosis. L2-3: Chronic intervertebral disc space narrowing with diffuse disc bulge and disc desiccation. Mild to moderate  facet hypertrophy. Changes superimposed on short pedicles resultant moderate spinal stenosis. Mild bilateral foraminal narrowing. L3-4: Mild diffuse disc bulge with disc desiccation, asymmetric to the left. Mild to moderate facet hypertrophy. Trace bilateral joint effusions. Mild epidural lipomatosis. Changes superimposed on short pedicles resultant moderate spinal stenosis. Mild bilateral L3 foraminal narrowing. L4-5: Chronic intervertebral disc space narrowing with diffuse disc bulge and disc desiccation. Superimposed broad-based central disc protrusion with slight superior angulation and annular fissure. Moderate right worse than left facet hypertrophy. Epidural lipomatosis. Changes superimposed on short pedicles resultant mild-to-moderate spinal stenosis. Foramina remain patent. L5-S1: Transitional lumbosacral anatomy with rudimentary L5-S1 disc. No stenosis. IMPRESSION: MRI THORACIC SPINE IMPRESSION 1. No MRI evidence for acute infection within the thoracic spine. 2. Multiple osseous metastatic lesions involving the thoracic spine as above, most pronounced at T6 and T11 where there is associated extension of tumor into the adjacent ventral epidural space. Resultant mild to moderate spinal stenosis at these levels, most pronounced at T11. 3. Marked hepatomegaly with innumerable hepatic metastases, better seen on prior abdominal CT. MRI LUMBAR SPINE IMPRESSION 1. No MRI evidence for acute infection within the lumbar spine. 2. Osseous metastatic lesions involving the lumbar spine at the L1 and L3 levels. No extra osseous or epidural tumor identified. 3. 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 4. Transitional lumbosacral anatomy. Careful correlation to numbering system on this exam recommended prior to any potential future intervention. Electronically Signed  By: Jeannine Boga M.D.   On: 02/10/2020 00:23   MR Lumbar Spine W Wo  Contrast  Result Date: 02/10/2020 CLINICAL DATA:  Initial evaluation for lower back pain with elevated lactic acid, evaluate for spinal infection. History of metastatic neuroendocrine tumor. EXAM: MRI THORACIC AND LUMBAR SPINE WITHOUT AND WITH CONTRAST TECHNIQUE: Multiplanar and multiecho pulse sequences of the thoracic and lumbar spine were obtained without and with intravenous contrast. CONTRAST:  7.50mL GADAVIST GADOBUTROL 1 MMOL/ML IV SOLN COMPARISON:  Prior CT from earlier the same day as well as previous PET-CT from 10/15/2018. FINDINGS: MRI THORACIC SPINE FINDINGS Alignment: Physiologic with preservation of the normal thoracic kyphosis. No listhesis. Vertebrae: Multiple scattered osseous metastases seen throughout the thoracic spine. These are seen at numerous levels, including the T2, T4, T5, T6, T9, and T11 levels. For reference purposes, largest of these lesions is seen at T6, with the T6 vertebral body is largely replaced by tumor. Vertebral body height maintained without associated pathologic fracture. There is associated epidural extension with tumor seen extending into the ventral epidural space at the levels of T6 (series 29, image 19), and T11 (series 29, image 43). Associated mild to moderate spinal stenosis at these levels, most pronounced at T11. No other significant epidural or extraosseous tumor. Underlying bone marrow signal intensity diffusely decreased on T1 weighted sequence, likely related to history of malignancy and post treatment changes. No findings to suggest osteomyelitis discitis or septic arthritis. Cord: Signal intensity within the thoracic spinal cord is within normal limits. No epidural abscess or other collection. Paraspinal and other soft tissues: Paraspinous soft tissues demonstrate no acute finding. Minimal atelectatic changes noted within the partially visualized lungs. Partially visualized liver is markedly enlarged with innumerable metastatic lesions, better seen on prior  CT. Disc levels: T4-5: Small central disc protrusion mildly indents the ventral thecal sac. No significant stenosis. T8-9: Mild disc bulge, eccentric to the left. No significant stenosis. No other significant disc pathology seen within the thoracic spine. No other significant stenosis. MRI LUMBAR SPINE FINDINGS Segmentation: Transitional lumbosacral anatomy with sacralized L5 vertebral body. Last well-formed disc space labeled L4-5 on this exam. Alignment: Physiologic with preservation of the normal lumbar lordosis. No listhesis. Vertebrae: Multiple scattered osseous metastases seen throughout the lumbar spine, specifically involving the L1 and L3 vertebral bodies. For reference purposes, the largest lesion is seen at L3 and measures 2.4 cm in size. Probable few additional lesions noted within the partially visualized pelvis. No extra osseous or epidural tumor within the lumbar spine. Underlying bone marrow signal intensity diffusely decreased on T1 weighted imaging. No evidence for osteomyelitis discitis or septic arthritis. Conus medullaris: Extends to the T12-L1 level and appears normal. Paraspinal and other soft tissues: Paraspinous soft tissues demonstrate no acute finding. Liver is markedly enlarged with innumerable metastases, better seen on prior CT. Disc levels: Diffuse congenital shortening of the pedicles noted. L1-2:  Negative interspace.  Mild facet hypertrophy.  No stenosis. L2-3: Chronic intervertebral disc space narrowing with diffuse disc bulge and disc desiccation. Mild to moderate facet hypertrophy. Changes superimposed on short pedicles resultant moderate spinal stenosis. Mild bilateral foraminal narrowing. L3-4: Mild diffuse disc bulge with disc desiccation, asymmetric to the left. Mild to moderate facet hypertrophy. Trace bilateral joint effusions. Mild epidural lipomatosis. Changes superimposed on short pedicles resultant moderate spinal stenosis. Mild bilateral L3 foraminal narrowing. L4-5:  Chronic intervertebral disc space narrowing with diffuse disc bulge and disc desiccation. Superimposed broad-based central disc protrusion with slight superior angulation and annular fissure.  Moderate right worse than left facet hypertrophy. Epidural lipomatosis. Changes superimposed on short pedicles resultant mild-to-moderate spinal stenosis. Foramina remain patent. L5-S1: Transitional lumbosacral anatomy with rudimentary L5-S1 disc. No stenosis. IMPRESSION: MRI THORACIC SPINE IMPRESSION 1. No MRI evidence for acute infection within the thoracic spine. 2. Multiple osseous metastatic lesions involving the thoracic spine as above, most pronounced at T6 and T11 where there is associated extension of tumor into the adjacent ventral epidural space. Resultant mild to moderate spinal stenosis at these levels, most pronounced at T11. 3. Marked hepatomegaly with innumerable hepatic metastases, better seen on prior abdominal CT. MRI LUMBAR SPINE IMPRESSION 1. No MRI evidence for acute infection within the lumbar spine. 2. Osseous metastatic lesions involving the lumbar spine at the L1 and L3 levels. No extra osseous or epidural tumor identified. 3. 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 4. Transitional lumbosacral anatomy. Careful correlation to numbering system on this exam recommended prior to any potential future intervention. Electronically Signed   By: Jeannine Boga M.D.   On: 02/10/2020 00:23   CT ABDOMEN PELVIS W CONTRAST  Result Date: 02/09/2020 CLINICAL DATA:  Vomiting and back pain x4 days. EXAM: CT ABDOMEN AND PELVIS WITH CONTRAST TECHNIQUE: Multidetector CT imaging of the abdomen and pelvis was performed using the standard protocol following bolus administration of intravenous contrast. CONTRAST:  171mL OMNIPAQUE IOHEXOL 300 MG/ML  SOLN COMPARISON:  January 15, 2020 FINDINGS: Lower chest: No acute abnormality. Hepatobiliary: The liver  is markedly enlarged. Numerous heterogeneous low-attenuation liver lesions of various sizes are seen. No gallstones, gallbladder wall thickening, or biliary dilatation. Pancreas: A 2.1 cm x 2.6 cm heterogeneous low-attenuation mass is seen at the junction of the pancreatic body and tail (axial CT image 33, CT series number 2). An additional 2.6 cm x 2.9 cm heterogeneous low-attenuation mass is seen within the pancreatic tail (axial CT image 29, CT series number 2). Spleen: Small calcified granulomas are seen scattered throughout the spleen. Adrenals/Urinary Tract: Adrenal glands are unremarkable. Kidneys are normal, without renal calculi, focal lesion, or hydronephrosis. Bladder is unremarkable. Stomach/Bowel: Stomach is within normal limits. Appendix appears normal. No evidence of bowel wall thickening, distention, or inflammatory changes. A large amount of stool is seen throughout the colon. Vascular/Lymphatic: Moderate severity aortic calcification. No enlarged abdominal or pelvic lymph nodes. Reproductive: Prostate is unremarkable. Other: No abdominal wall hernia or abnormality. No abdominopelvic ascites. Musculoskeletal: Sclerotic foci are seen involving the T12, L2, L4 vertebral bodies with additional small sclerotic foci noted within the left iliac bone. The IMPRESSION: 1. Marked severity hepatomegaly with numerous heterogeneous low-attenuation liver lesions of various sizes, consistent with hepatic metastasis. 2. Findings consistent with osseous metastasis involving the T12, L2, L4 vertebral bodies and left iliac bone. 3. Low-attenuation soft tissue masses within the pancreas which may represent a primary malignancy. Aortic Atherosclerosis (ICD10-I70.0). Electronically Signed   By: Virgina Norfolk M.D.   On: 02/09/2020 20:16   DG Chest Portable 1 View  Result Date: 02/09/2020 CLINICAL DATA:  Fever EXAM: PORTABLE CHEST 1 VIEW COMPARISON:  01/15/2020 FINDINGS: Right Port-A-Cath remains in place,  unchanged. Low lung volumes. Right base atelectasis. Left lung clear. Heart is normal size. No effusions or acute bony abnormality. IMPRESSION: Low lung volumes. Right base atelectasis. Findings similar to prior study. Electronically Signed   By: Rolm Baptise M.D.   On: 02/09/2020 21:46     Labs:   Basic Metabolic Panel: Recent Labs  Lab 02/11/20 [id number]  02/11/20 0539 02/12/20 0213 02/12/20 0213 02/13/20 0531 02/13/20 0531 02/14/20 0023 02/14/20 0023 02/14/20 0500 02/14/20 0500 02/15/20 0453 02/16/20 0340  NA 135   < > 136   < > 134*  --  134*  --  136  --  134* 131*  K 4.2   < > 4.5   < > 4.3   < > 5.3*   < > 4.0   < > 3.5 3.4*  CL 107   < > 110   < > 106  --  110  --  111  --  109 105  CO2 11*   < > 16*   < > 16*  --  8*  --  14*  --  14* 15*  GLUCOSE 188*   < > 169*   < > 194*  --  170*  --  178*  --  193* 165*  BUN 32*   < > 37*   < > 32*  --  31*  --  39*  --  51* 55*  CREATININE 1.04   < > 1.01   < > 1.07  --  1.30*  --  1.38*  --  1.71* 2.01*  CALCIUM 7.7*   < > 7.3*   < > 7.1*  --  5.9*  --  6.5*  --  6.3* 6.4*  MG 1.6*  --  2.3  --  2.4  --   --   --  2.4  --  2.4  --   PHOS 2.4*  --   --   --  1.4*  --   --   --  3.6  --  3.0  --    < > = values in this interval not displayed.   GFR Estimated Creatinine Clearance: 37 mL/min (A) (by C-G formula based on SCr of 2.01 mg/dL (H)). Liver Function Tests: Recent Labs  Lab 02/10/20 0237 02/11/20 0637 02/13/20 0531 02/14/20 0728 02/15/20 0453  AST 31 34 156* 352* 191*  ALT 23 22 83* 163* 129*  ALKPHOS 89 66 63 62 66  BILITOT 1.3* 1.4* 2.2* 3.5* 3.7*  PROT 7.5 6.7 7.1 6.0* 6.2*  ALBUMIN 2.6* 2.2* 2.6* 2.1* 2.1*   Recent Labs  Lab 02/09/20 1543  LIPASE 21   No results for input(s): AMMONIA in the last 168 hours. Coagulation profile Recent Labs  Lab 02/09/20 2150 02/14/20 0011  INR 0.8 3.0*    CBC: Recent Labs  Lab 02/13/20 1615 02/14/20 0237 02/14/20 0500 02/14/20 1000 02/15/20 0453 02/15/20 1330  02/16/20 0340  WBC 33.8*  --  24.4*  --  24.2* 24.1* 19.4*  HGB 5.9*   < > 7.7* 7.5* 6.6* 8.2* 7.6*  HCT 19.8*   < > 23.4* 22.7* 20.0* 25.0* 23.1*  MCV 96.6  --  90.3  --  92.2 91.2 92.0  PLT 212  --  158  --  155 165 141*   < > = values in this interval not displayed.   Cardiac Enzymes: No results for input(s): CKTOTAL, CKMB, CKMBINDEX, TROPONINI in the last 168 hours. BNP: Invalid input(s): POCBNP CBG: Recent Labs  Lab 02/13/20 1753  GLUCAP 151*   D-Dimer No results for input(s): DDIMER in the last 72 hours. Hgb A1c No results for input(s): HGBA1C in the last 72 hours. Lipid Profile No results for input(s): CHOL, HDL, LDLCALC, TRIG, CHOLHDL, LDLDIRECT in the last 72 hours. Thyroid function studies No results for input(s): TSH, T4TOTAL, T3FREE, THYROIDAB in the  last 72 hours.  Invalid input(s): FREET3 Anemia work up No results for input(s): VITAMINB12, FOLATE, FERRITIN, TIBC, IRON, RETICCTPCT in the last 72 hours. Microbiology Recent Results (from the past 240 hour(s))  Urine culture     Status: None   Collection Time: 02/09/20  7:48 PM   Specimen: Urine, Clean Catch  Result Value Ref Range Status   Specimen Description   Final    URINE, CLEAN CATCH Performed at [facility], [place] [address], [place], [place] [address]    Special Requests   Final    NONE Performed at [facility], [place] [address], [place], [place] [address]    Culture   Final    NO GROWTH Performed at [facility], [place] [address], [place], [place] [address]    Report Status 02/11/2020 FINAL  Final  Culture, blood (routine x 2)     Status: None   Collection Time: 02/09/20  9:04 PM   Specimen: BLOOD  Result Value Ref Range Status   Specimen Description BLOOD BLOOD LEFT FOREARM  Final   Special Requests   Final    BOTTLES DRAWN AEROBIC AND ANAEROBIC Blood Culture results may not be optimal due to an excessive volume of blood received in culture  bottles Performed at [facility], [place] [address], [place], [place] [address]    Culture NO GROWTH 5 DAYS  Final   Report Status 02/15/2020 FINAL  Final  Culture, blood (routine x 2)     Status: None   Collection Time: 02/09/20  9:45 PM   Specimen: BLOOD LEFT FOREARM  Result Value Ref Range Status   Specimen Description BLOOD LEFT FOREARM  Final   Special Requests   Final    BOTTLES DRAWN AEROBIC AND ANAEROBIC Blood Culture results may not be optimal due to an excessive volume of blood received in culture bottles Performed at [facility], [place] [address], [place],  [address]    Culture NO GROWTH 5 DAYS  Final   Report Status 02/15/2020 FINAL  Final  SARS CORONAVIRUS 2 (TAT 6-24 HRS) Nasopharyngeal Nasopharyngeal Swab     Status: None   Collection Time: 02/10/20 12:10 AM   Specimen: Nasopharyngeal Swab  Result Value Ref Range Status   SARS Coronavirus 2 NEGATIVE NEGATIVE Final    Comment: (NOTE) SARS-CoV-2 target nucleic acids are NOT DETECTED. The SARS-CoV-2 RNA is generally detectable in upper and lower respiratory specimens during the acute phase of infection. Negative results do not preclude SARS-CoV-2 infection, do not rule out co-infections with other pathogens, and should not be used as the sole basis for treatment or other patient management decisions. Negative results must be combined with clinical observations, patient history, and epidemiological information. The expected result is Negative. Fact Sheet for Patients: SugarRoll.be Fact Sheet for Healthcare Providers: https://www.woods-mathews.com/ This test is not yet approved or cleared by the [place] FDA and  has been authorized for detection and/or diagnosis of SARS-CoV-2 by FDA under an Emergency Use Authorization (EUA). This EUA will remain  in effect (meaning this test can be used) for the duration of the COVID-19  declaration under Section 56 4(b)(1) of the Act, 21 U.S.C. section 360bbb-3(b)(1), unless the authorization is terminated or revoked sooner. Performed at [facility], [place] [address] [place], [place] [address]   C Difficile Quick Screen w PCR reflex     Status: Abnormal   Collection Time: 02/11/20  3:00 PM   Specimen:  STOOL  Result Value Ref Range Status   C Diff antigen POSITIVE (A) NEGATIVE Final   C Diff toxin NEGATIVE NEGATIVE Final   C Diff interpretation Results are indeterminate. See PCR results.  Final    Comment: Performed at [facility], [place] [address] [place], [place] [address]  Gastrointestinal Panel by PCR , Stool     Status: None   Collection Time: 02/11/20  3:00 PM   Specimen: STOOL  Result Value Ref Range Status   Campylobacter species NOT DETECTED NOT DETECTED Final   Plesimonas shigelloides NOT DETECTED NOT DETECTED Final   Salmonella species NOT DETECTED NOT DETECTED Final   Yersinia enterocolitica NOT DETECTED NOT DETECTED Final   Vibrio species NOT DETECTED NOT DETECTED Final   Vibrio cholerae NOT DETECTED NOT DETECTED Final   Enteroaggregative E coli (EAEC) NOT DETECTED NOT DETECTED Final   Enteropathogenic E coli (EPEC) NOT DETECTED NOT DETECTED Final   Enterotoxigenic E coli (ETEC) NOT DETECTED NOT DETECTED Final   Shiga like toxin producing E coli (STEC) NOT DETECTED NOT DETECTED Final   Shigella/Enteroinvasive E coli (EIEC) NOT DETECTED NOT DETECTED Final   Cryptosporidium NOT DETECTED NOT DETECTED Final   Cyclospora cayetanensis NOT DETECTED NOT DETECTED Final   Entamoeba histolytica NOT DETECTED NOT DETECTED Final   Giardia lamblia NOT DETECTED NOT DETECTED Final   Adenovirus F40/41 NOT DETECTED NOT DETECTED Final   Astrovirus NOT DETECTED NOT DETECTED Final   Norovirus GI/GII NOT DETECTED NOT DETECTED Final   Rotavirus A NOT DETECTED NOT DETECTED Final   Sapovirus (I, II, IV, and V) NOT DETECTED NOT DETECTED Final     Comment: Performed at [facility], [place] [place], [place] [address]  C. Diff by PCR, Reflexed     Status: Abnormal   Collection Time: 02/11/20  3:00 PM  Result Value Ref Range Status   Toxigenic C. Difficile by PCR POSITIVE (A) NEGATIVE Final    Comment: Positive for toxigenic C. difficile with little to no toxin production. Only treat if clinical presentation suggests symptomatic illness. Performed at [facility], [place] [address], [place], [place] [address]   Culture, blood (Routine X 2) w Reflex to ID Panel     Status: None (Preliminary result)   Collection Time: 02/13/20  7:45 PM   Specimen: BLOOD RIGHT HAND  Result Value Ref Range Status   Specimen Description BLOOD RIGHT HAND  Final   Special Requests   Final    BOTTLES DRAWN AEROBIC ONLY Blood Culture results may not be optimal due to an inadequate volume of blood received in culture bottles Performed at [facility], [place] [address] [place], [place] [address]    Culture NO GROWTH 2 DAYS  Final   Report Status PENDING  Incomplete  Culture, blood (Routine X 2) w Reflex to ID Panel     Status: None (Preliminary result)   Collection Time: 02/13/20  7:45 PM   Specimen: BLOOD  Result Value Ref Range Status   Specimen Description BLOOD RIGHT WRIST  Final   Special Requests   Final    BOTTLES DRAWN AEROBIC ONLY Blood Culture adequate volume Performed at [facility], [place] [address] [place], [place] [address]    Culture NO GROWTH 2 DAYS  Final   Report Status PENDING  Incomplete  Culture, Urine     Status: None   Collection Time: 02/13/20  9:30 PM   Specimen: Urine, Random  Result Value  Ref Range Status   Specimen Description   Final    URINE, RANDOM Performed at [place] [address] [place], [place] [address]    Special Requests   Final    NONE Performed at [facility], [place] [address], [place], [place] [address]     Culture   Final    NO GROWTH Performed at [facility], [place] [address] [place], [place] [address]    Report Status 02/15/2020 FINAL  Final  MRSA PCR Screening     Status: None   Collection Time: 02/14/20  9:43 AM   Specimen: Nasal Mucosa; Nasopharyngeal  Result Value Ref Range Status   MRSA by PCR NEGATIVE NEGATIVE Final    Comment:        The GeneXpert MRSA Assay (FDA approved for NASAL specimens only), is one component of a comprehensive MRSA colonization surveillance program. It is not intended to diagnose MRSA infection nor to guide or monitor treatment for MRSA infections. Performed at [facility], [place] [address], [place], [place] [address]      Discharge Instructions:   Discharge Instructions    Diet general   Complete by: As directed    Discharge instructions   Complete by: As directed    Follow-up with your hospice care provider at the hospice facility.   Increase activity slowly   Complete by: As directed      Allergies as of 02/16/2020      Reactions   Remdesivir Other (See Comments)   Chest tightness   Chlorhexidine    PORT not accessed      Medication List    STOP taking these medications   apixaban 2.5 MG Tabs tablet Commonly known as: Eliquis   carvedilol 6.25 MG tablet Commonly known as: COREG   predniSONE 20 MG tablet Commonly known as: DELTASONE     TAKE these medications   ferrous sulfate 325 (65 FE) MG tablet Take 1 tablet (325 mg total) by mouth daily with breakfast.   hydrOXYzine 25 MG tablet Commonly known as: ATARAX/VISTARIL Take 1 tablet (25 mg total) by mouth every 6 (six) hours.   LORazepam 0.5 MG tablet Commonly known as: Ativan Take 1 tablet (0.5 mg total) by mouth every 6 (six) hours as needed (Nausea or vomiting).   metoprolol tartrate 25 MG tablet Commonly known as: LOPRESSOR Take 1 tablet (25 mg total) by mouth 2 (two) times daily.   ondansetron 8 MG tablet Commonly known as:  Zofran Take 1 tablet (8 mg total) by mouth 2 (two) times daily as needed for refractory nausea / vomiting. Start on day 3 after carboplatin chemo.   oxyCODONE 5 MG immediate release tablet Commonly known as: Oxy IR/ROXICODONE Take 1-2 tablets (5-10 mg total) by mouth every 4 (four) hours as needed for severe pain.   pantoprazole 40 MG tablet Commonly known as: Protonix Take 1 tablet (40 mg total) by mouth 2 (two) times daily.   potassium chloride SA 20 MEQ tablet Commonly known as: KLOR-CON Take 1 tablet (20 mEq total) by mouth daily for 5 days.   vancomycin 50 mg/mL  oral solution Commonly known as: VANCOCIN Take 2.5 mLs (125 mg total) by mouth 4 (four) times daily for 5 days.         Time coordinating discharge: 39 minutes  Signed:  Reanne Nellums  [facility] 02/16/2020, 12:16 PM

## 2020-02-18 LAB — CULTURE, BLOOD (ROUTINE X 2)
Culture: NO GROWTH
Culture: NO GROWTH
Special Requests: ADEQUATE

## 2020-02-22 ENCOUNTER — Inpatient Hospital Stay: Payer: Medicaid Other

## 2020-04-03 DEATH — deceased

## 2020-04-04 ENCOUNTER — Ambulatory Visit: Payer: Medicaid Other

## 2020-05-16 ENCOUNTER — Ambulatory Visit: Payer: Medicaid Other

## 2020-07-02 IMAGING — US US BIOPSY CORE LIVER
1 series · 13 of 15 positions shown · non-contrast
Comparison: none

INDICATION: Liver metastases, unknown primary

[Series 1: us biopsy core liver · 13 of 15 slices shown]
[im 1/15]
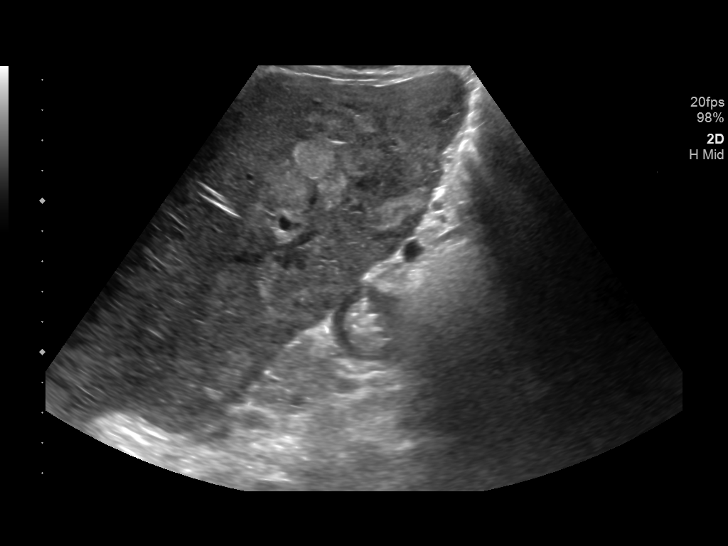
[im 2/15]
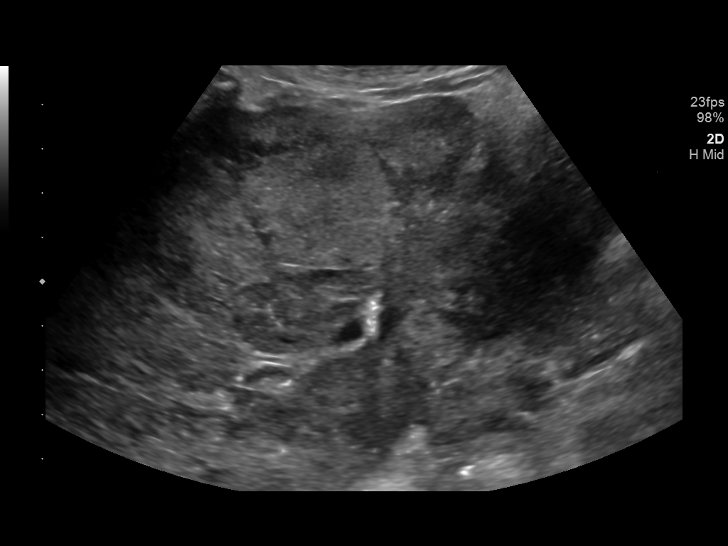
[im 3/15]
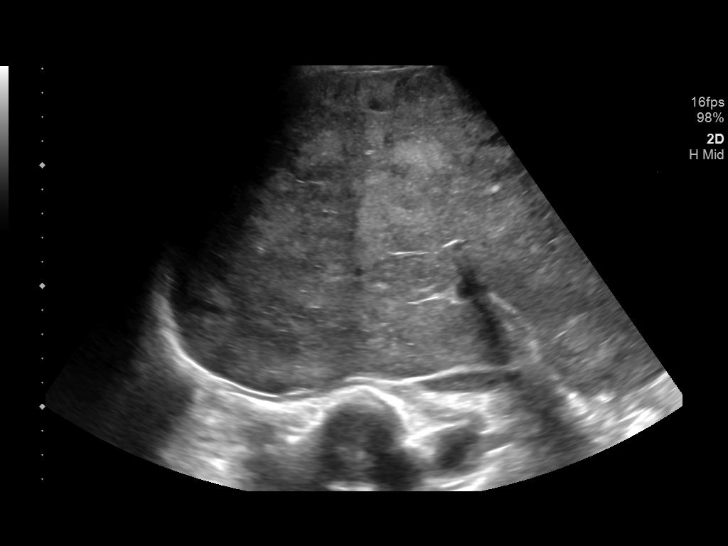
[im 5/15]
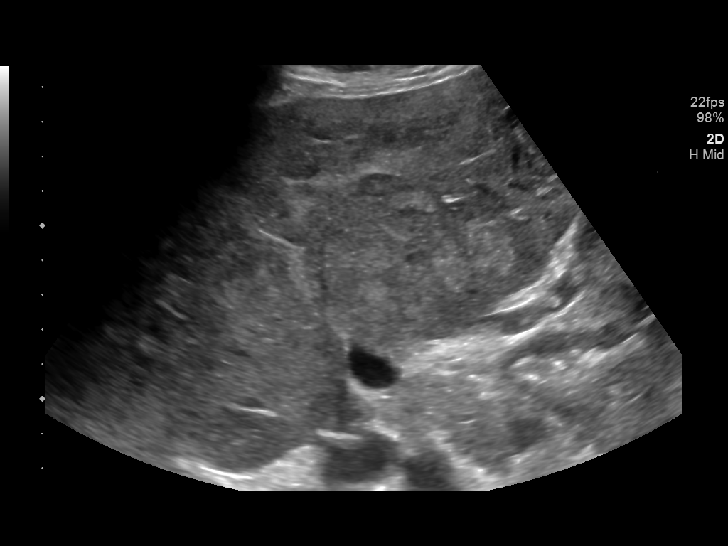
[im 6/15]
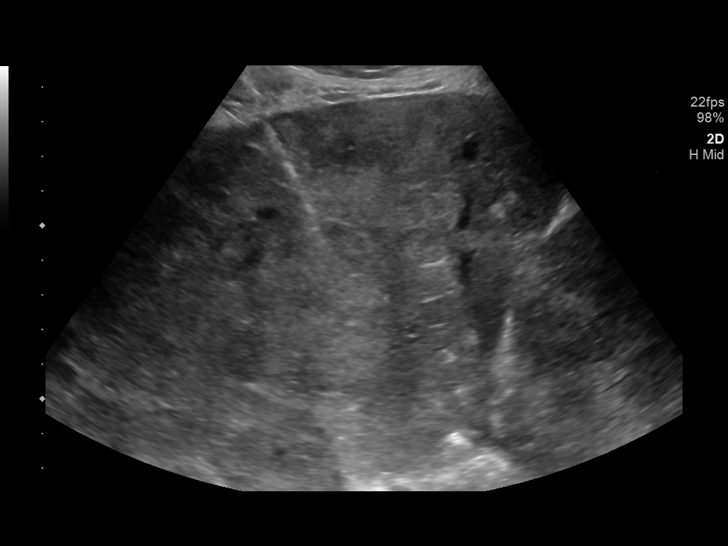
[im 7/15]
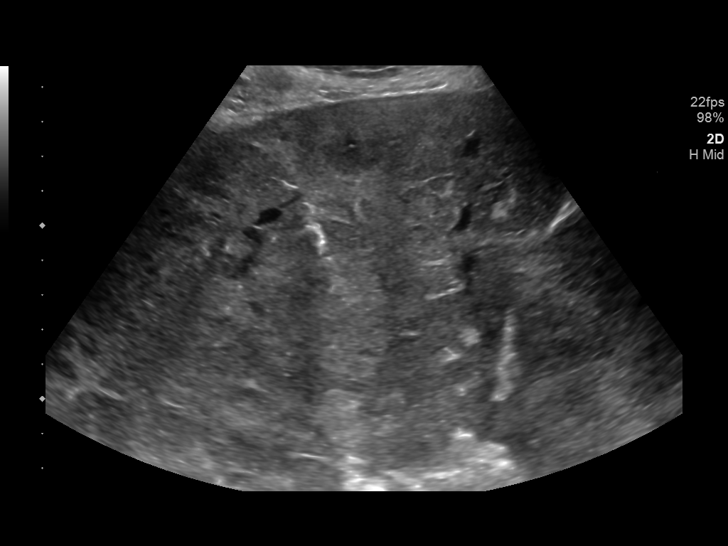
[im 8/15]
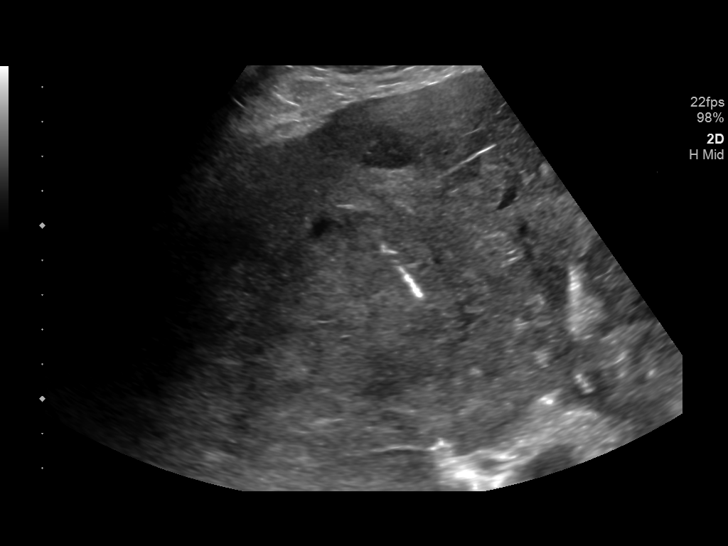
[im 9/15]
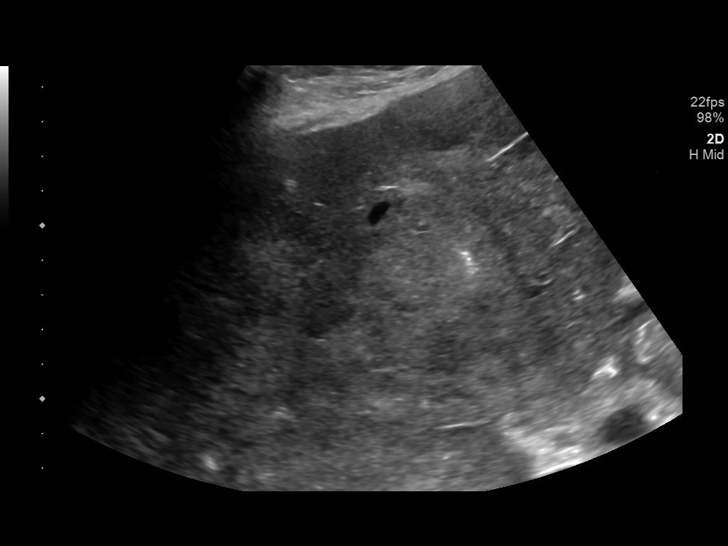
[im 10/15]
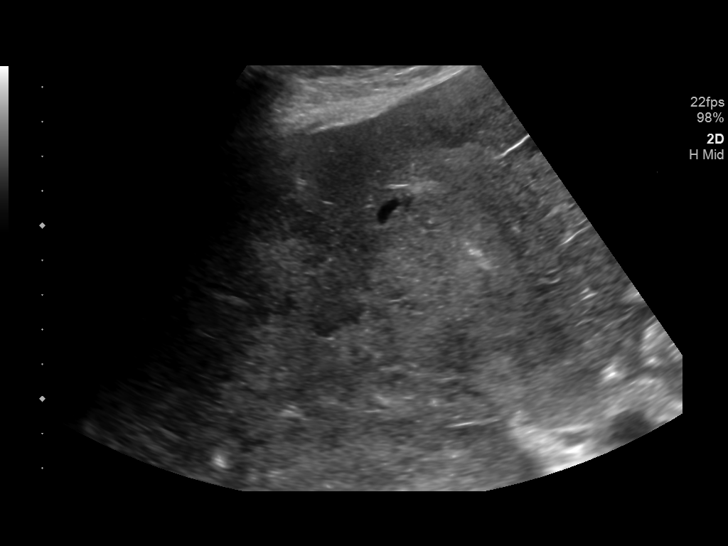
[im 11/15]
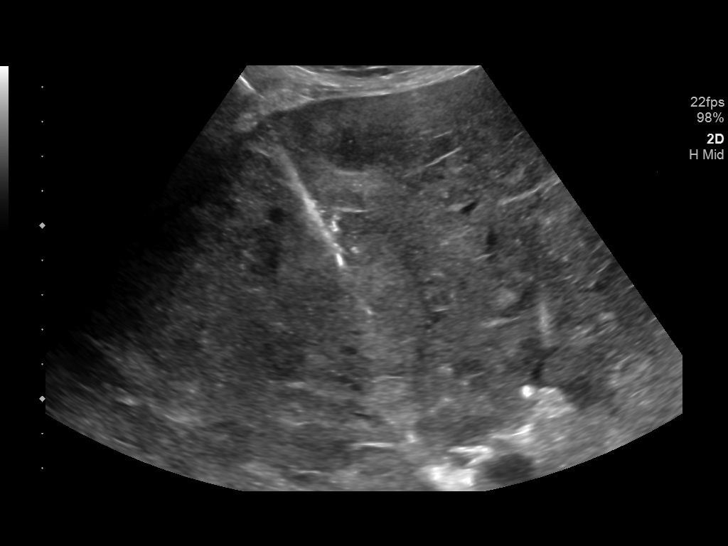
[im 13/15]
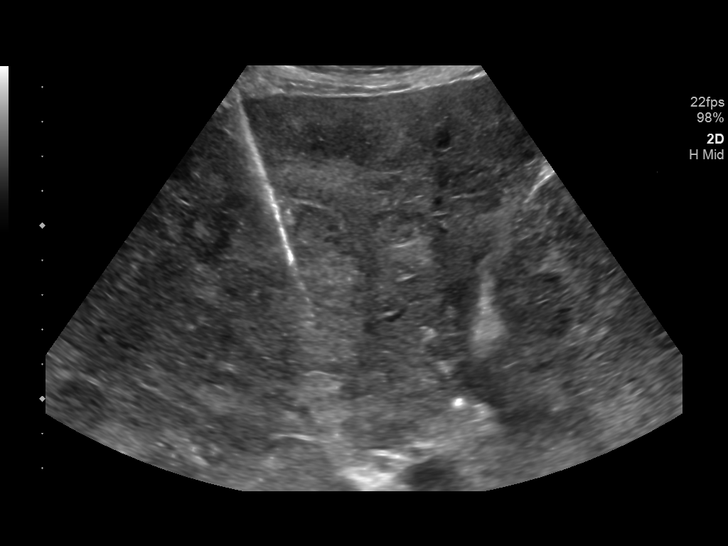
[im 14/15]
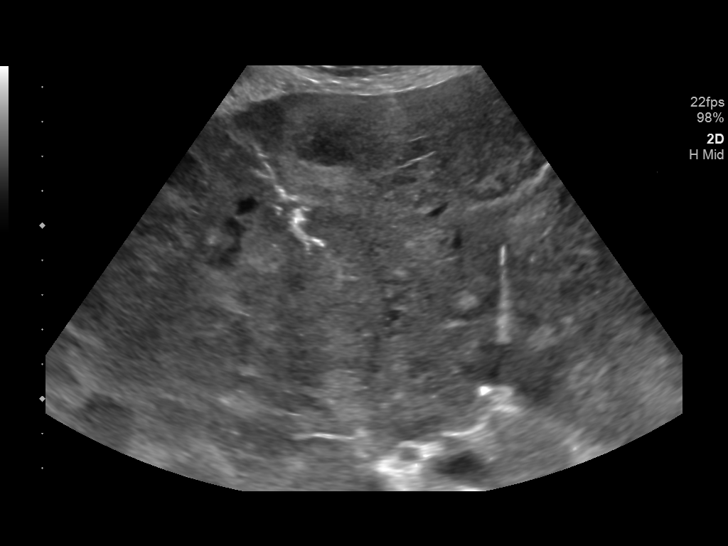
[im 15/15]
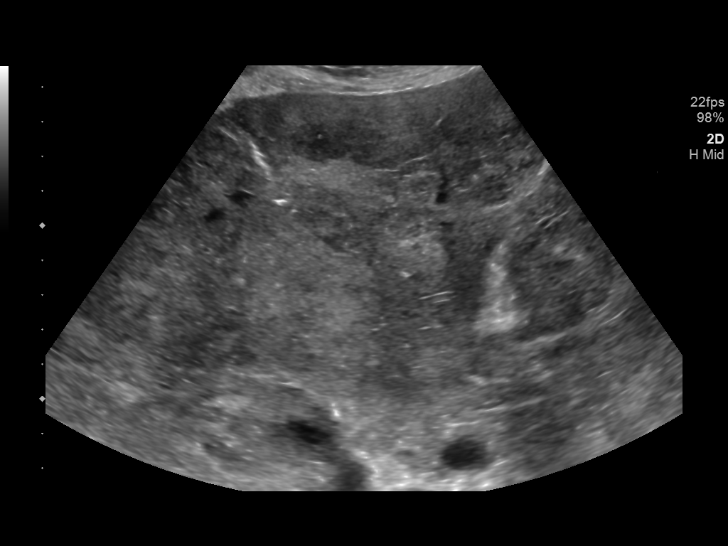

[13 of 15 positions shown; findings below may reference images not displayed]

EXAM:
ULTRASOUND GUIDED CORE BIOPSY OF LEFT HEPATIC MASS

MEDICATIONS:
1% LIDOCAINE LOCAL

ANESTHESIA/SEDATION:
Versed 2.0mg IV; Fentanyl 188mcg IV;

Moderate Sedation Time:  14 MINUTES

The patient was continuously monitored during the procedure by the
interventional radiology nurse under my direct supervision.

FLUOROSCOPY TIME:  Fluoroscopy Time: NONE.

COMPLICATIONS:
None immediate.

PROCEDURE:
The procedure, risks, benefits, and alternatives were explained to
the patient. Questions regarding the procedure were encouraged and
answered. The patient understands and consents to the procedure.

Previous imaging reviewed. Preliminary ultrasound performed. A left
hepatic lesion was localized and marked.

Under sterile conditions and local anesthesia, a 17 gauge coaxial
guide needle was advanced from a subxiphoid anterior approach to the
left hepatic lesion. Needle position confirmed with ultrasound. 18
gauge core biopsies obtained. Samples placed in formalin. Needle
removed. Postprocedure imaging demonstrates no hemorrhage or
hematoma. Patient tolerated biopsy well.
FINDINGS: Imaging confirms needle placed in the left hepatic lesion for core
biopsy
IMPRESSION: Successful ultrasound left hepatic mass 18 gauge core biopsy

## 2020-07-20 IMAGING — US IR FLUORO GUIDE CV LINE*L*
1 series · 1 of 1 positions shown · non-contrast
Comparison: PET-CT-10/15/2018

INDICATION: History of metastatic neuroendocrine cancer. In need of durable
intravenous access for chemotherapy administration.

EXAM:
IMPLANTED PORT A CATH PLACEMENT WITH ULTRASOUND AND FLUOROSCOPIC
GUIDANCE

[Series 1: ir fluoro/shunt/fist · 1 of 1 slices shown]
[im 1/1]
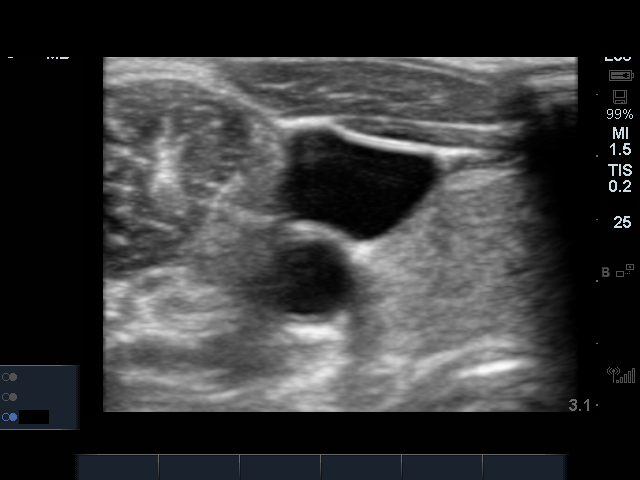

[1 of 1 positions shown; findings below may reference images not displayed]

MEDICATIONS:
Ancef 2 gm IV; The antibiotic was administered within an appropriate
time interval prior to skin puncture.

ANESTHESIA/SEDATION:
Moderate (conscious) sedation was employed during this procedure. A
total of Versed 3 mg and Fentanyl 100 mcg was administered
intravenously.

Moderate Sedation Time: 27 minutes. The patient's level of
consciousness and vital signs were monitored continuously by
radiology nursing throughout the procedure under my direct
supervision.

CONTRAST:  None

FLUOROSCOPY TIME:  16 seconds (4.7 mGy)

COMPLICATIONS:
None immediate.

PROCEDURE:
The procedure, risks, benefits, and alternatives were explained to
the patient. Questions regarding the procedure were encouraged and
answered. The patient understands and consents to the procedure.

The right neck and chest were prepped with chlorhexidine in a
sterile fashion, and a sterile drape was applied covering the
operative field. Maximum barrier sterile technique with sterile
gowns and gloves were used for the procedure. A timeout was
performed prior to the initiation of the procedure. Local anesthesia
was provided with 1% lidocaine with epinephrine.

After creating a small venotomy incision, a micropuncture kit was
utilized to access the internal jugular vein. Real-time ultrasound
guidance was utilized for vascular access including the acquisition
of a permanent ultrasound image documenting patency of the accessed
vessel. The microwire was utilized to measure appropriate catheter
length.

A subcutaneous port pocket was then created along the upper chest
wall utilizing a combination of sharp and blunt dissection. The
pocket was irrigated with sterile saline. A single lumen thin power
injectable port was chosen for placement. The 8 Fr catheter was
tunneled from the port pocket site to the venotomy incision. The
port was placed in the pocket. The external catheter was trimmed to
appropriate length. At the venotomy, an 8 Fr peel-away sheath was
placed over a guidewire under fluoroscopic guidance. The catheter
was then placed through the sheath and the sheath was removed. Final
catheter positioning was confirmed and documented with a
fluoroscopic spot radiograph. The port was accessed with Naty Salasar
needle, aspirated and flushed with heparinized saline.

The venotomy site was closed with an interrupted 4-0 Vicryl suture.
The port pocket incision was closed with interrupted 2-0 Vicryl
suture and the skin was opposed with a running subcuticular 4-0
Vicryl suture. Dermabond and Mussa were applied to both
incisions. Dressings were placed. The patient tolerated the
procedure well without immediate post procedural complication.
FINDINGS: After catheter placement, the tip lies within the superior
cavoatrial junction. The catheter aspirates and flushes normally and
is ready for immediate use.
IMPRESSION: Successful placement of a right internal jugular approach power
injectable Port-A-Cath. The catheter is ready for immediate use.

## 2020-07-25 IMAGING — CT CT ABD-PELV W/ CM
3 of 11 series · 11 of 46 positions shown, 17 images · IV contrast (ISOVUE)
Comparison: PET CT 10/15/2018, CT abdomen and pelvis 10/01/2018

CLINICAL DATA: Abdominal pain and dyspnea starting this morning.

EXAM:
CT ANGIOGRAPHY CHEST
CT ABDOMEN AND PELVIS WITH CONTRAST
TECHNIQUE: Multidetector CT imaging of the chest was performed using the
standard protocol during bolus administration of intravenous
contrast. Multiplanar CT image reconstructions and MIPs were
obtained to evaluate the vascular anatomy. Multidetector CT imaging
of the abdomen and pelvis was performed using the standard protocol
during bolus administration of intravenous contrast.
CONTRAST:  100mL [id number] IOPAMIDOL (MNB45O-9YL) INJECTION 76%

[Series 4: axial st · axial · 0.83mm/px · z∈[-513,-243]mm · 4 of 92 slices shown, 9 images]
[im 19/92  soft-tissue]
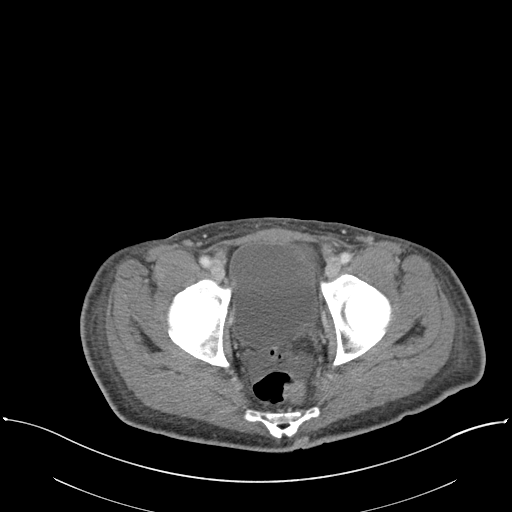
[im 19/92  lung]
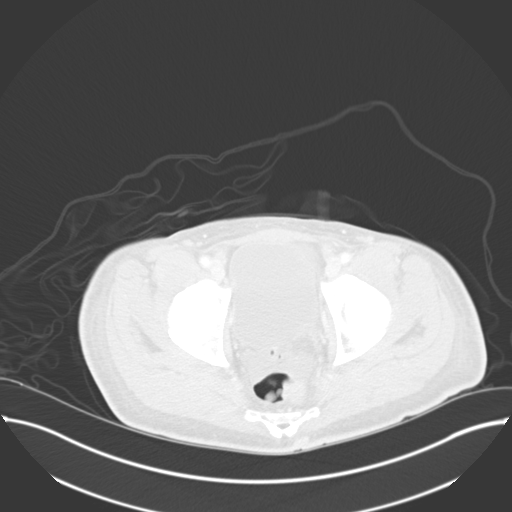
[im 19/92  bone]
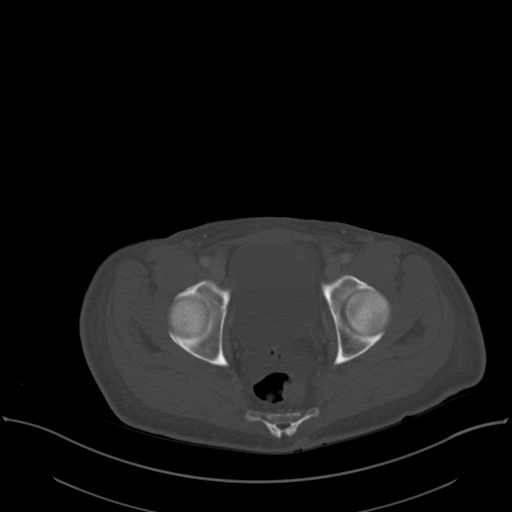
[im 37/92  soft-tissue]
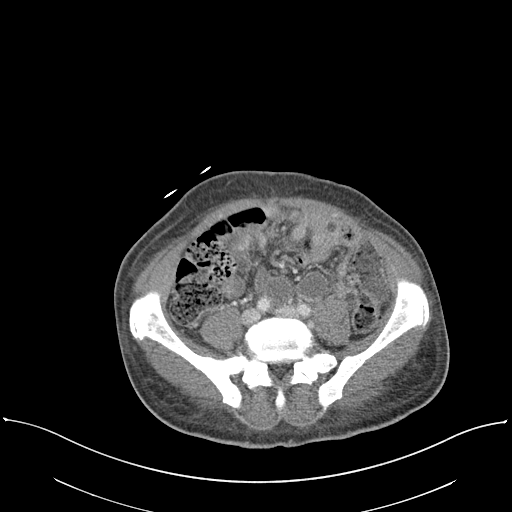
[im 37/92  lung]
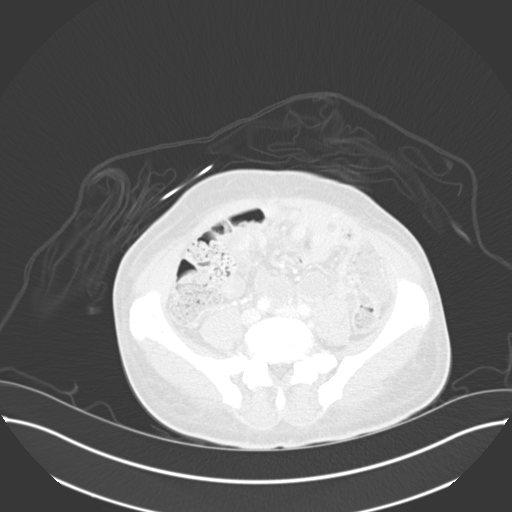
[im 55/92  soft-tissue]
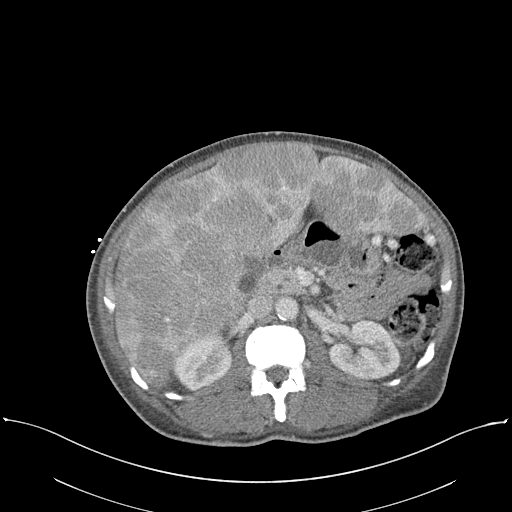
[im 55/92  lung]
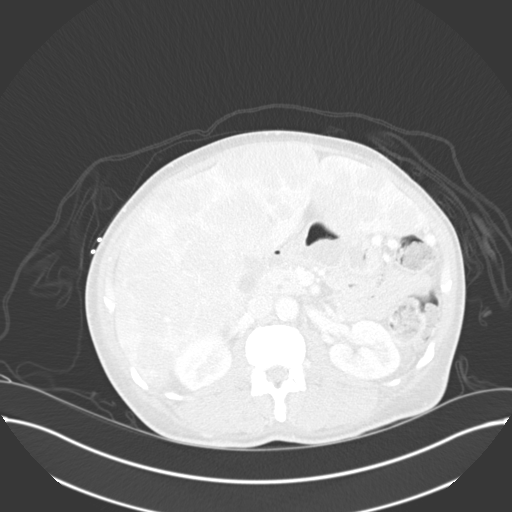
[im 73/92  soft-tissue]
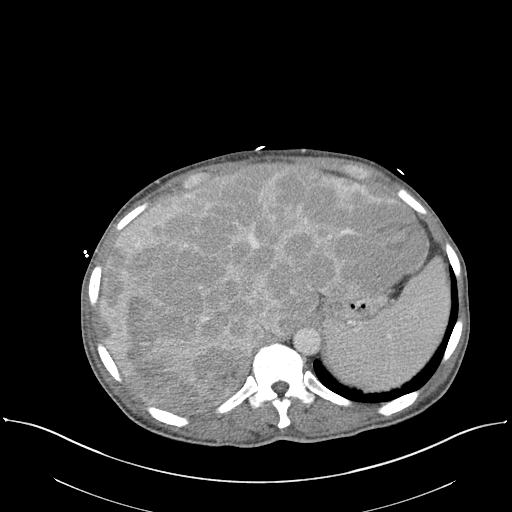
[im 73/92  lung]
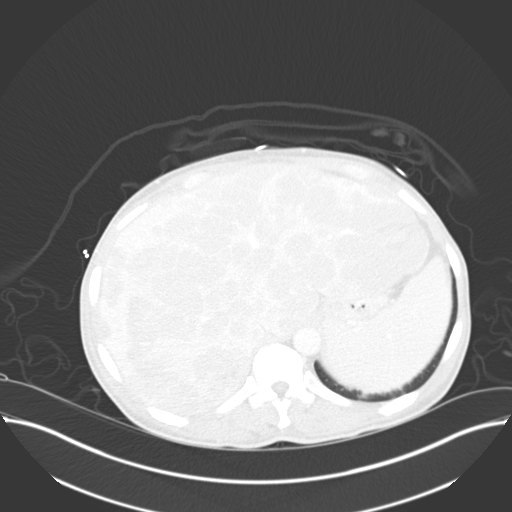

[Series 8: thins · axial · 0.84mm/px · z∈[-270,-98]mm · 6 of 288 slices shown]
[im 20/288  soft-tissue]
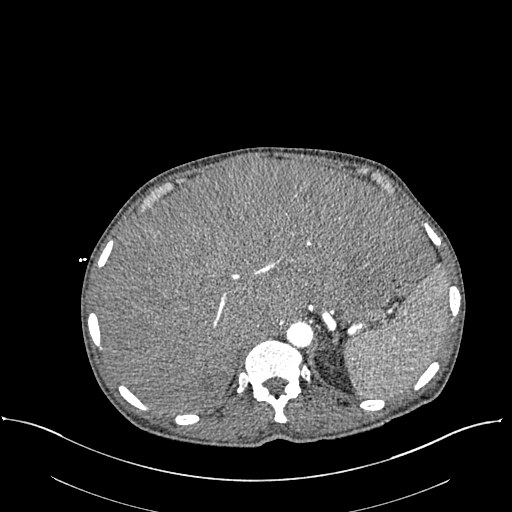
[im 58/288  soft-tissue]
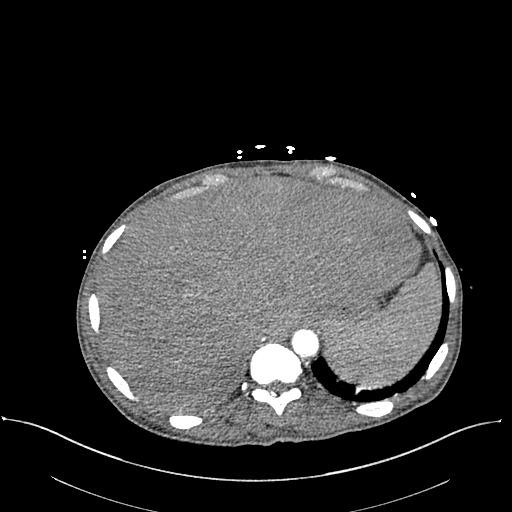
[im 96/288  soft-tissue]
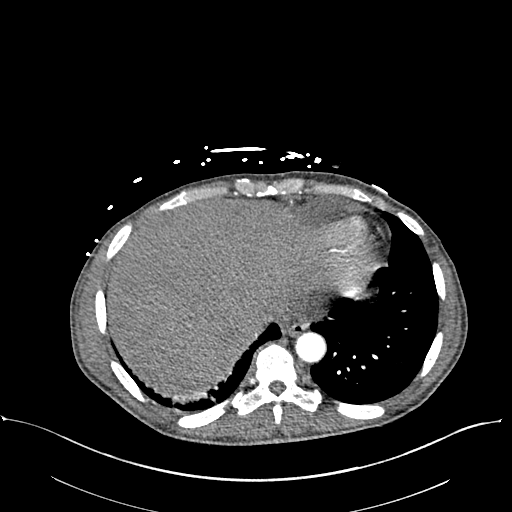
[im 134/288  soft-tissue]
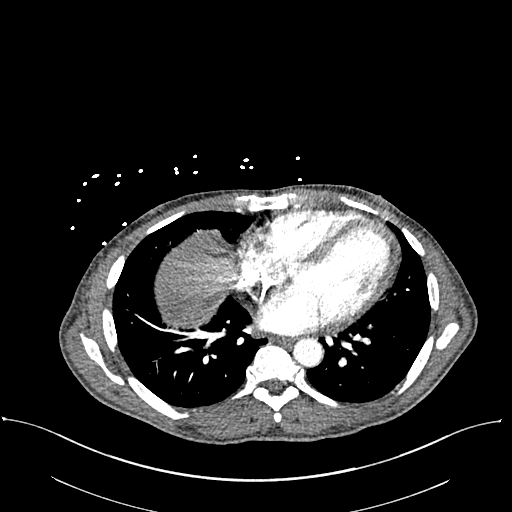
[im 154/288  soft-tissue]
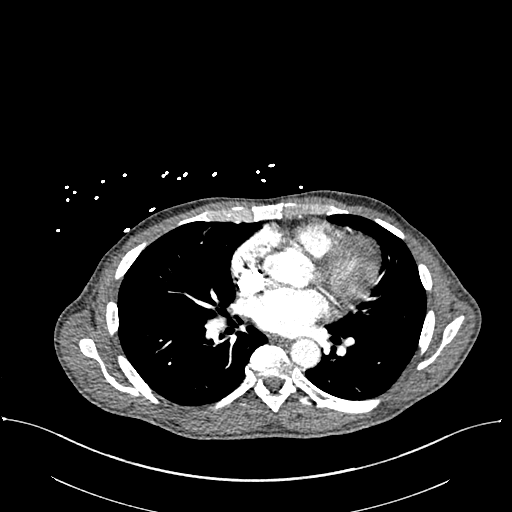
[im 192/288  soft-tissue]
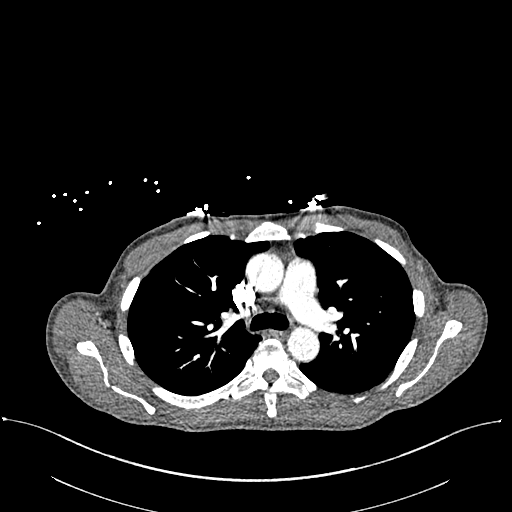

[Series 10: coronal mpr · coronal · 0.58mm/px · 1 of 123 slices shown, 2 images]
[im 62/123  soft-tissue]
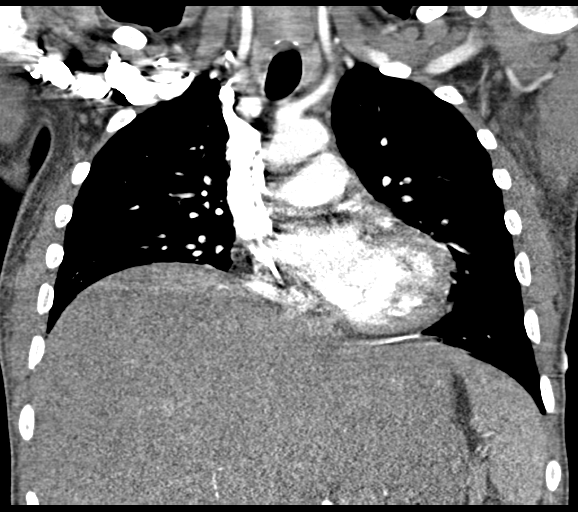
[im 62/123  bone]
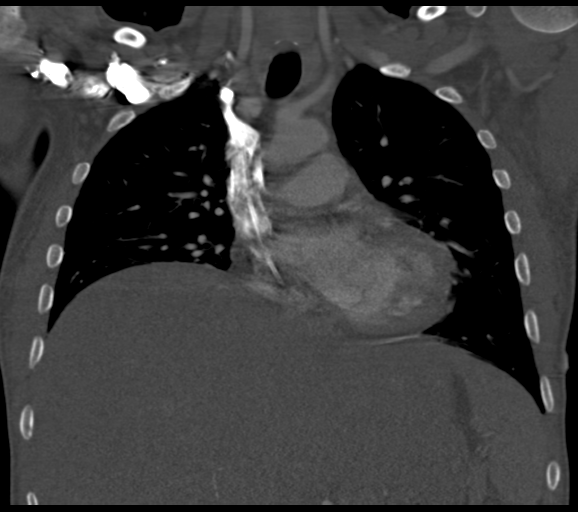

[11 of 46 positions shown; findings below may reference images not displayed]

FINDINGS: CTA CHEST FINDINGS

Cardiovascular: Satisfactory opacification of the pulmonary arteries
to the segmental level. No evidence of pulmonary embolism. Normal
heart size. No pericardial effusion.

Mediastinum/Nodes: Common arterial branch point of the right
brachiocephalic and left common carotid arteries. No aneurysm or
dissection. Nonaneurysmal thoracic aorta. No dissection is noted. No
mediastinal or hilar lymphadenopathy. The CT appearance of the
esophagus is unremarkable. Midline patent trachea. Patent mainstem
bronchi.

Lungs/Pleura: No dominant mass or pulmonary consolidation.
Redemonstration of tiny 3 mm or less right upper lobe pulmonary
nodules as seen on PET-CT. No significant progression. No effusion.
Minimal right basilar atelectasis. No pneumothorax.

Musculoskeletal: No aggressive osseous lesions. No acute fracture.
Port catheter is noted overlying the anterior right chest wall leads
projecting into right atrium.

Review of the MIP images confirms the above findings.

CT ABDOMEN and PELVIS FINDINGS

Hepatobiliary: Redemonstration of too numerous to count hypodense
liver masses consistent with metastasis. Gallbladder is unremarkable
and free of stones. There is a stable cyst in the right hepatic lobe
measuring 1.8 cm.

Pancreas: 12 mm hypodense lesion in the pancreatic tail is only
minimally enlarged from 10 mm previously. No ductal dilatation or
inflammation.

Spleen: No splenomegaly. Scattered calcified granulomata are noted.

Adrenals/Urinary Tract: Normal bilateral adrenal glands.
Indeterminate mass arising off the upper pole the right kidney
measuring up to 1.7 cm in diameter is without significant
progression. No additional lesions are noted. No nephrolithiasis nor
hydroureteronephrosis.

Stomach/Bowel: The stomach is decompressed in appearance. No bowel
obstruction or inflammation. Moderate stool retention is seen within
the colon. No annular constricting lesions are identified.Normal
caliber appendix with appendicular with measuring 6 mm is
identified.

Vascular/Lymphatic: Atherosclerosis of the abdominal aorta without
aneurysm. Patent branch vessels. The splenic and portal veins are
patent.

Reproductive: Normal size prostate.

Other: Trace free fluid in the pelvis. Mild soft tissue anasarca.

Musculoskeletal: No aggressive osteo lytic or blastic lesion.
Osteoarthritis the right SI joint with bridging osteophyte. Small
sclerotic focus in the left iliac bone is indeterminate stable and
more likely represents a bone island.

Review of the MIP images confirms the above findings.
IMPRESSION: 1. No acute pulmonary embolus, aortic aneurysm or dissection.
2. Stable tiny 3 mm or less right upper lobe pulmonary nodules.
3. Redemonstration of too numerous to count hypodense liver masses
consistent with metastasis.
4. Indeterminate mass arising off the upper pole the right kidney
measuring up to 1.7 cm in diameter without significant progression.
5. Indeterminate 12 mm hypodense lesion in the pancreatic tail only
minimally enlarged from 10 mm previously.
6. Atherosclerosis of the abdominal aorta without aneurysm or
dissection.
